# Patient Record
Sex: Female | Born: 1955 | Race: Black or African American | Hispanic: No | Smoking: Never smoker
Health system: Southern US, Community
[De-identification: ages and names within clinical notes are randomized; demographics above are authoritative.]

## PROBLEM LIST (undated history)

## (undated) DIAGNOSIS — I10 Essential (primary) hypertension: Secondary | ICD-10-CM

## (undated) DIAGNOSIS — M1990 Unspecified osteoarthritis, unspecified site: Secondary | ICD-10-CM

## (undated) DIAGNOSIS — E119 Type 2 diabetes mellitus without complications: Secondary | ICD-10-CM

## (undated) DIAGNOSIS — M109 Gout, unspecified: Secondary | ICD-10-CM

## (undated) DIAGNOSIS — N189 Chronic kidney disease, unspecified: Secondary | ICD-10-CM

## (undated) HISTORY — PX: TOTAL KNEE ARTHROPLASTY: SHX125

---

## 2000-05-14 ENCOUNTER — Other Ambulatory Visit: Admission: RE | Admit: 2000-05-14 | Discharge: 2000-05-14 | Payer: Self-pay | Admitting: *Deleted

## 2000-06-04 ENCOUNTER — Other Ambulatory Visit: Admission: RE | Admit: 2000-06-04 | Discharge: 2000-06-04 | Payer: Self-pay | Admitting: *Deleted

## 2003-04-07 ENCOUNTER — Encounter: Admission: RE | Admit: 2003-04-07 | Discharge: 2003-07-06 | Payer: Self-pay | Admitting: Internal Medicine

## 2003-06-10 ENCOUNTER — Encounter: Admission: RE | Admit: 2003-06-10 | Discharge: 2003-06-10 | Payer: Self-pay | Admitting: Internal Medicine

## 2004-01-14 ENCOUNTER — Other Ambulatory Visit: Admission: RE | Admit: 2004-01-14 | Discharge: 2004-01-14 | Payer: Self-pay | Admitting: Internal Medicine

## 2004-03-04 ENCOUNTER — Ambulatory Visit (HOSPITAL_COMMUNITY): Admission: RE | Admit: 2004-03-04 | Discharge: 2004-03-04 | Payer: Self-pay | Admitting: Gastroenterology

## 2005-03-20 HISTORY — PX: FOOT SURGERY: SHX648

## 2005-04-20 ENCOUNTER — Emergency Department (HOSPITAL_COMMUNITY): Admission: EM | Admit: 2005-04-20 | Discharge: 2005-04-20 | Payer: Self-pay | Admitting: Emergency Medicine

## 2005-10-17 ENCOUNTER — Other Ambulatory Visit: Admission: RE | Admit: 2005-10-17 | Discharge: 2005-10-17 | Payer: Self-pay | Admitting: Internal Medicine

## 2006-11-21 ENCOUNTER — Encounter (INDEPENDENT_AMBULATORY_CARE_PROVIDER_SITE_OTHER): Payer: Self-pay | Admitting: Nurse Practitioner

## 2006-11-21 ENCOUNTER — Ambulatory Visit: Payer: Self-pay | Admitting: Internal Medicine

## 2006-11-21 ENCOUNTER — Ambulatory Visit: Payer: Self-pay | Admitting: *Deleted

## 2006-11-21 LAB — CONVERTED CEMR LAB
ALT: 33 units/L (ref 0–35)
Albumin: 3.7 g/dL (ref 3.5–5.2)
BUN: 14 mg/dL (ref 6–23)
Basophils Absolute: 0 10*3/uL (ref 0.0–0.1)
CO2: 27 meq/L (ref 19–32)
Glucose, Bld: 211 mg/dL — ABNORMAL HIGH (ref 70–99)
HCT: 36.1 % (ref 36.0–46.0)
Hemoglobin: 11.1 g/dL — ABNORMAL LOW (ref 12.0–15.0)
Lymphocytes Relative: 30 % (ref 12–46)
MCV: 88.5 fL (ref 78.0–100.0)
Monocytes Relative: 5 % (ref 3–11)
Potassium: 3.9 meq/L (ref 3.5–5.3)
RDW: 15.2 % — ABNORMAL HIGH (ref 11.5–14.0)
Sodium: 140 meq/L (ref 135–145)
Total Bilirubin: 0.4 mg/dL (ref 0.3–1.2)

## 2006-12-10 ENCOUNTER — Ambulatory Visit: Payer: Self-pay | Admitting: Internal Medicine

## 2006-12-10 ENCOUNTER — Encounter (INDEPENDENT_AMBULATORY_CARE_PROVIDER_SITE_OTHER): Payer: Self-pay | Admitting: Nurse Practitioner

## 2006-12-10 LAB — CONVERTED CEMR LAB
BUN: 16 mg/dL (ref 6–23)
Calcium: 9.4 mg/dL (ref 8.4–10.5)
Chloride: 105 meq/L (ref 96–112)
Cholesterol: 182 mg/dL (ref 0–200)
HDL: 86 mg/dL (ref >39)
Potassium: 4 meq/L (ref 3.5–5.3)
Total CHOL/HDL Ratio: 2.1
VLDL: 14 mg/dL (ref 0–40)

## 2007-01-08 ENCOUNTER — Ambulatory Visit: Payer: Self-pay | Admitting: Family Medicine

## 2007-01-22 ENCOUNTER — Ambulatory Visit: Payer: Self-pay | Admitting: Family Medicine

## 2007-02-05 ENCOUNTER — Ambulatory Visit: Payer: Self-pay | Admitting: Family Medicine

## 2007-03-01 ENCOUNTER — Ambulatory Visit: Payer: Self-pay | Admitting: Family Medicine

## 2007-03-18 ENCOUNTER — Ambulatory Visit: Payer: Self-pay | Admitting: Family Medicine

## 2007-03-21 HISTORY — PX: JOINT REPLACEMENT: SHX530

## 2007-03-29 ENCOUNTER — Inpatient Hospital Stay (HOSPITAL_COMMUNITY): Admission: RE | Admit: 2007-03-29 | Discharge: 2007-04-03 | Payer: Self-pay | Admitting: Orthopedic Surgery

## 2007-07-04 ENCOUNTER — Ambulatory Visit: Payer: Self-pay | Admitting: Internal Medicine

## 2007-07-04 ENCOUNTER — Encounter (INDEPENDENT_AMBULATORY_CARE_PROVIDER_SITE_OTHER): Payer: Self-pay | Admitting: Nurse Practitioner

## 2007-07-04 LAB — CONVERTED CEMR LAB
Basophils Absolute: 0 10*3/uL (ref 0.0–0.1)
Basophils Relative: 0 % (ref 0–1)
Calcium: 10 mg/dL (ref 8.4–10.5)
Chloride: 104 meq/L (ref 96–112)
Eosinophils Relative: 4 % (ref 0–5)
MCV: 92.2 fL (ref 78.0–100.0)
Microalb, Ur: 92 mg/dL — ABNORMAL HIGH (ref 0.00–1.89)
Monocytes Absolute: 0.3 10*3/uL (ref 0.1–1.0)
Monocytes Relative: 5 % (ref 3–12)
Neutro Abs: 3.9 10*3/uL (ref 1.7–7.7)
Potassium: 4.3 meq/L (ref 3.5–5.3)
Sodium: 143 meq/L (ref 135–145)

## 2007-10-01 ENCOUNTER — Encounter: Admission: RE | Admit: 2007-10-01 | Discharge: 2007-10-01 | Payer: Self-pay | Admitting: Internal Medicine

## 2008-01-27 ENCOUNTER — Ambulatory Visit (HOSPITAL_COMMUNITY): Admission: RE | Admit: 2008-01-27 | Discharge: 2008-01-27 | Payer: Self-pay | Admitting: Orthopedic Surgery

## 2008-02-21 ENCOUNTER — Inpatient Hospital Stay (HOSPITAL_COMMUNITY): Admission: RE | Admit: 2008-02-21 | Discharge: 2008-02-25 | Payer: Self-pay | Admitting: Orthopedic Surgery

## 2008-09-24 ENCOUNTER — Other Ambulatory Visit: Admission: RE | Admit: 2008-09-24 | Discharge: 2008-09-24 | Payer: Self-pay | Admitting: Internal Medicine

## 2008-10-01 ENCOUNTER — Encounter: Admission: RE | Admit: 2008-10-01 | Discharge: 2008-10-01 | Payer: Self-pay | Admitting: Internal Medicine

## 2009-02-26 IMAGING — CR DG CHEST 2V
2 series · 2 of 2 positions shown · non-contrast
Comparison: 06/10/03

CLINICAL DATA: Preop for right knee surgery.
 CHEST - 2 VIEW:

[view not recorded (1 of 2)]
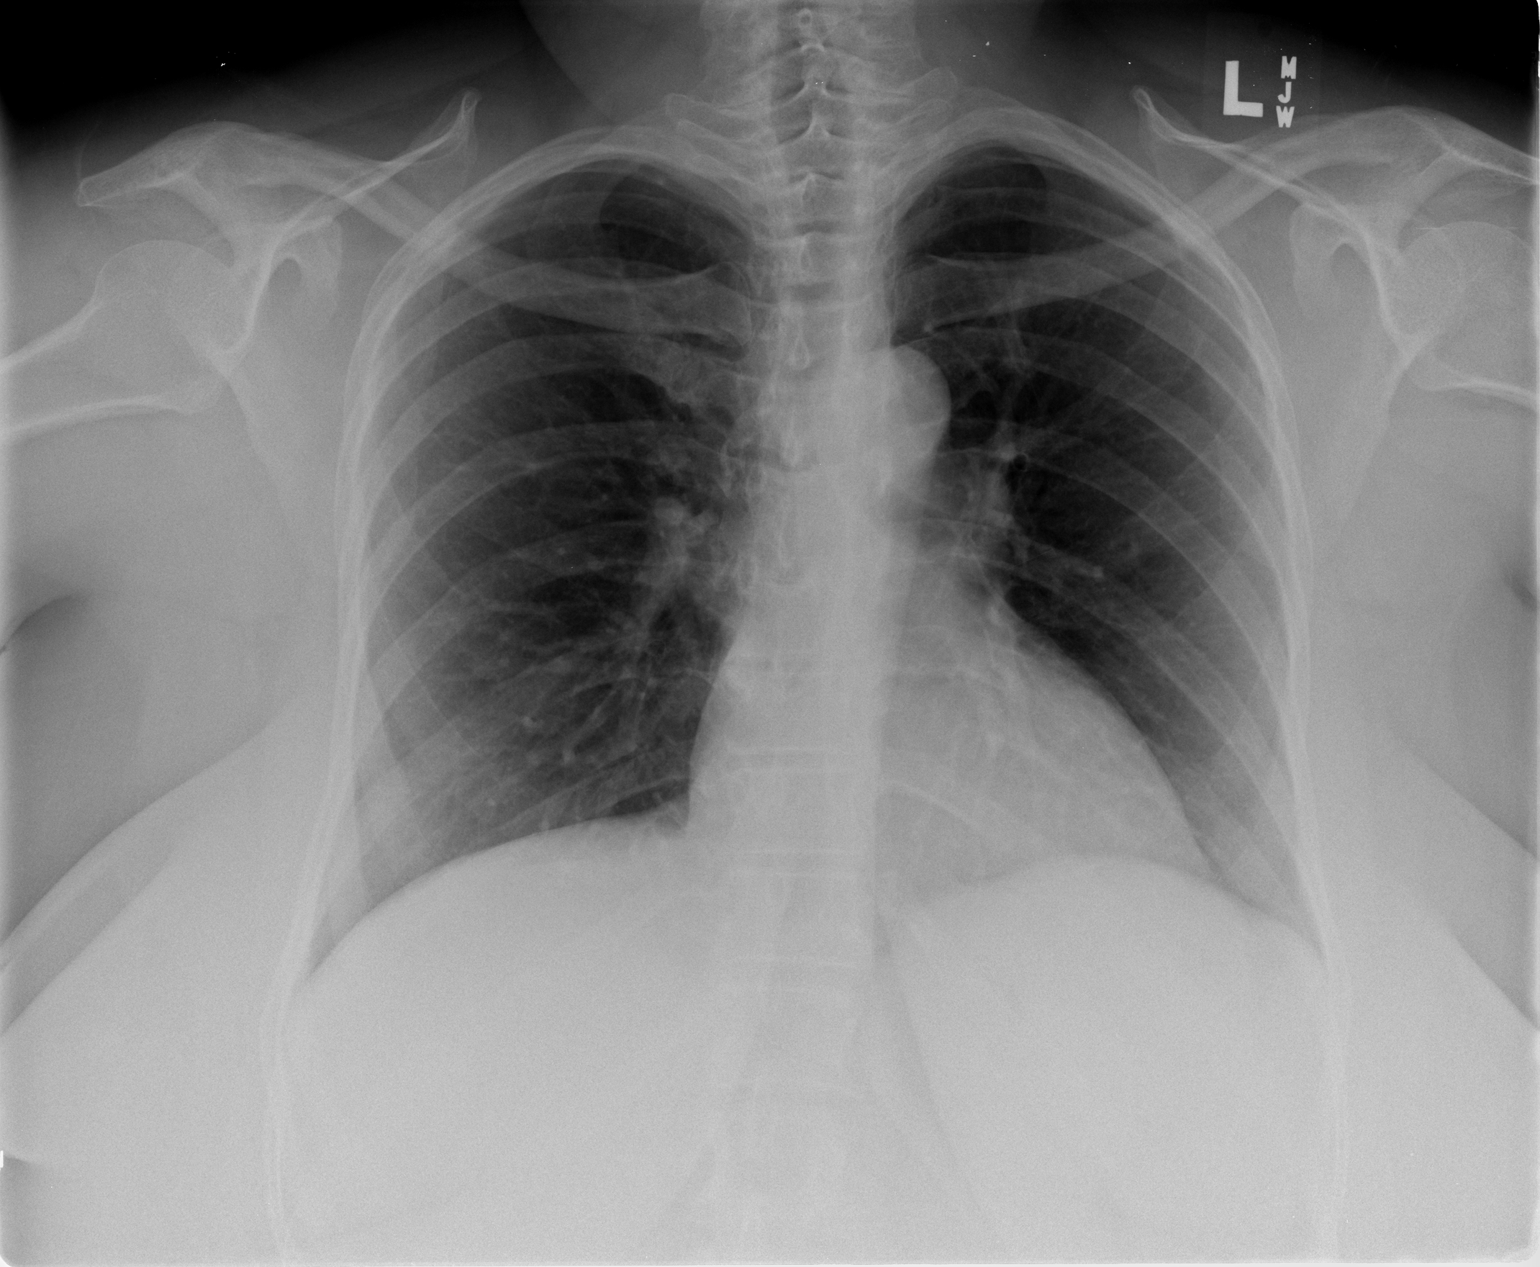

[view not recorded (2 of 2)]
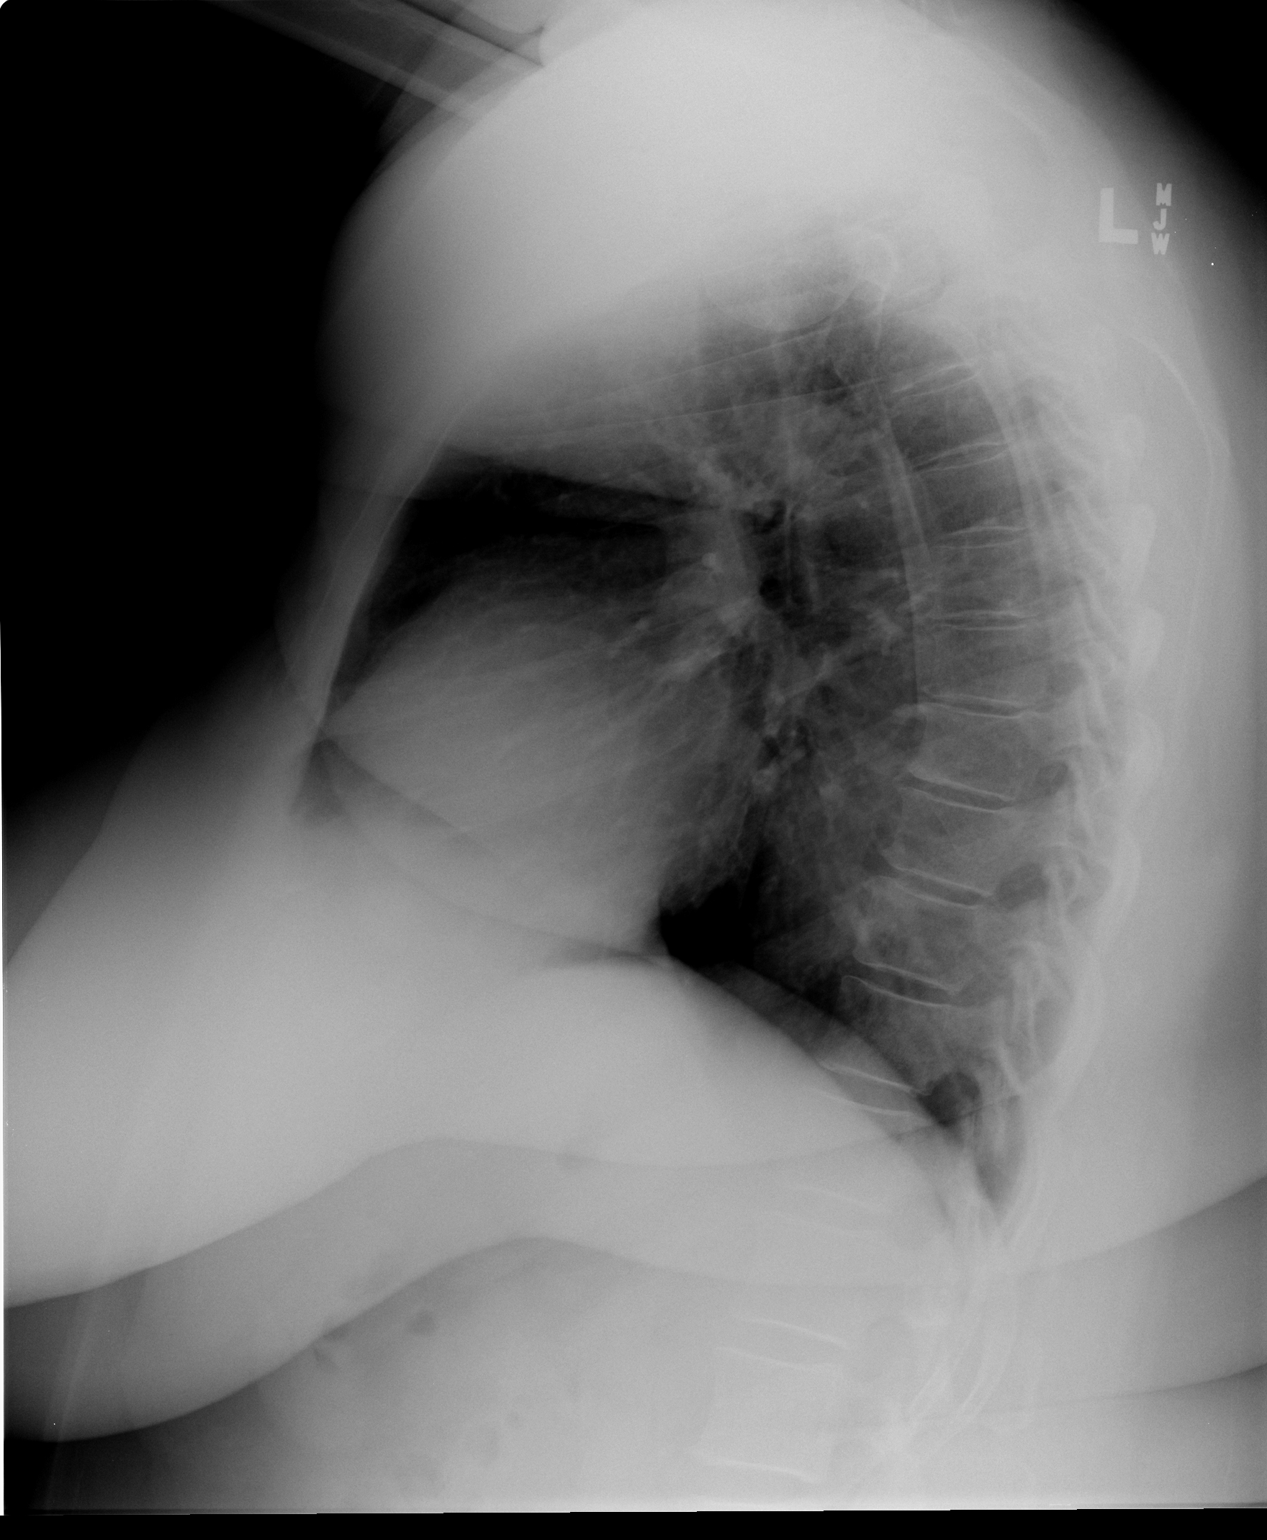

[2 of 2 positions shown; findings below may reference images not displayed]

FINDINGS: Two views of the chest show the lungs to be clear. The heart is within normal limits in size. No bony abnormality is seen.
IMPRESSION: No active lung disease.

## 2010-04-10 ENCOUNTER — Encounter: Payer: Self-pay | Admitting: Internal Medicine

## 2010-08-02 NOTE — Op Note (Signed)
NAME:  Kristen English, Kristen English              ACCOUNT NO.:  [id number]   MEDICAL RECORD NO.:  [id number]          PATIENT TYPE:  INP   LOCATION:  77                         FACILITY:  [facility]   PHYSICIAN:  Doran Heater. Veverly Fells, M.D. DATE OF BIRTH:  12-02-1955   DATE OF PROCEDURE:  02/21/2008  DATE OF DISCHARGE:                               OPERATIVE REPORT   PREOPERATIVE DIAGNOSIS:  Left knee end-stage osteoarthritis.   POSTOPERATIVE DIAGNOSIS:  Left knee end-stage osteoarthritis.   PROCEDURE PERFORMED:  Left total knee replacement.   SURGEON:  Doran Heater. Veverly Fells, MD   ASSISTANT:  Abbott Pao. Dixon, PA-C.   ANESTHESIA:  Spinal anesthesia was used plus MAC.   ESTIMATED BLOOD LOSS:  Minimal.   FLUID REPLACEMENT:  1000 mL crystalloid.   INSTRUMENT COUNT:  Correct.   COMPLICATIONS:  None.   Preoperative antibiotics were given.   INDICATIONS:  The patient is a 55 year old female with worsening left  knee pain.  The patient has end-stage arthritis on x-ray and has  developed debilitating pain in her knee and functional loss.  The  patient desires operative treatment to restore her function and  eliminate pain.  Informed consent was obtained.   DESCRIPTION OF PROCEDURE:  After an adequate level of anesthesia was  achieved, the patient was positioned supine on the operating room table.  The left leg was correctly identified.  Nonsterile tourniquet was placed  on the left proximal thigh.  The right leg was padded appropriately and  secured in the table.  After sterile prep and drape of left knee, we  went ahead and exsanguinated the limb using an Esmarch bandage and  elevating the tourniquet to 350 mmHg.  The patient's knee was entered  using a midline incision created with knee in flexion.  Medial  parapatellar arthrotomy was created.  Patella was everted.  Distal femur  entered using a step-cut drill.  We went ahead and removed 10 mm of bone  set on 5 degrees left.  We then went ahead and cut  the tibia, removed  the ACL, PCL, meniscal tissues, subluxed the tibia anteriorly, and cut  the tibia perpendicular to the long axis of tibia using an  extramedullary guide with minimal posterior slope.  Next, we went ahead  and checked her extension block, which was adequate.  Extension space  and gap was adequate and symmetric.  We then went ahead and incised her  femur to a size 4.  We then went ahead and removed the anterior,  posterior, and chamfer cuts using a 4-in-1 block.  Removed excess bone  off the posterior condyles using a 3-1/4 inch curved osteotome.  We then  incised the tibia to a size 3 and went ahead and performed a modular  keel punch and drill.  At this point, we went ahead and trialed with the  3 tibia and then trialed with the 4 left narrow trial, the standard size  4 implant was too wide, mediolateral.  At this point, we placed a 12.5  insert and were initially happy with our reduction, we might be able to  get  a 15.  We did go ahead and resurfaced the patella at this point  using a patellar clamp setting 360 mm of remaining bone and a 32  patellar button was placed.  Knee through full range of motion,  excellent patellofemoral stability was noted, removed all components,  placed the bone into the femur to plug that hole, and we used the  available bone for bone tamp.  We then cemented the components in the  place, size 3 tibia, size 4 narrow left femur, and then placed a 12.5  insert.  We allowed the cement to harden, we then removed excess cement  using 1/4-inch curved osteotome.  Next, we went and trialed with the 15,  15 was little bit too big and we went back to the size 12.5 trial.  At  this point, we thoroughly irrigated the knee.  We closed the knee with  layered closure #1 Vicryl for the parapatellar arthrotomy, 0 and 2-0  Vicryl for layered subcutaneous closure, and 4-0 running Monocryl for  skin.  Steri-Strips were applied followed by sterile dressing.   The  patient tolerated the surgery well.      Doran Heater. Veverly Fells, M.D.  Electronically Signed     SRN/MEDQ  D:  02/23/2008  T:  02/24/2008  Job:  [id number]

## 2010-08-02 NOTE — Op Note (Signed)
NAME:  Kristen English, Kristen English              ACCOUNT NO.:  [id number]   MEDICAL RECORD NO.:  [id number]          PATIENT TYPE:  INP   LOCATION:  2550                         FACILITY:  [facility]   PHYSICIAN:  Doran Heater. Veverly Fells, M.D. DATE OF BIRTH:  [birth year]   DATE OF PROCEDURE:  03/29/2007  DATE OF DISCHARGE:                               OPERATIVE REPORT   PREOP DIAGNOSIS:  Right knee end-stage osteoarthritis.   POSTOP DIAGNOSIS:  Right knee end-stage osteoarthritis.   PROCEDURE PERFORMED:  Right knee total knee replacement using DePuy  Sigma rotating platform prosthesis.   ATTENDING SURGEON:  Esmond Plants, MD.   ASSISTANT:  Charletta Cousin [place], [place].   General anesthesia plus femoral block anesthesia was used.   ESTIMATED BLOOD LOSS:  Minimal.   TOURNIQUET TIME:  2 hours and 10 minutes at 350 mmHg.   URINE OUTPUT:  150 mL.   FLUID REPLACEMENT:  2000 mL Crystalloid.   INSTRUMENT COUNT:  Correct.   No complications.  Perioperative antibiotics were given.   INDICATION:  Patient is a 55 year old female who presents with a history  of worsening right knee pain secondary to end-stage arthritis.  The  patient has failed conservative management, presents now for operative  treatment to restore function, eliminate pain.  Informed consent was  obtained.   DESCRIPTION OF PROCEDURE:  After an adequate level of anesthesia was  achieved, the patient was positioned supine on the operating room table,  the right leg was correctly identified, a nonsterile tourniquet was  placed in the right proximal thigh.  Right leg sterilely prepped and  draped in the usual manner.  After exsanguination of the limb using an  Esmarch bandage and elevation of the tourniquet to 350 mmHg, sterile  preparation and drape done.  We incised the knee with the knee in  flexion and went ahead and performed a medial parapatellar arthrotomy  using a fresh 10-blade scalpel.  We went ahead and everted the patella,  entered  the distal femur using a step cut drill, placed a distal femoral  intermedullary guide on the femur, set 5 degrees right with 11 mm  resection for slight flexion contracture.  Performed our distal femoral  cut.  Went ahead and cut the tibia due to difficulty getting the sizing  block on.  Went ahead and cut the tibia to 90 degrees perpendicular to  the long axis of the tibia with a slight posterior slope.  Next, we went  ahead and checked our extension block, we could easily get the 10 mm  block in.  We went ahead then and cut the distal femur using the DePuy  sizing jig which was a posterior skid and anterior down reference.  This  was then resized to a size 4.  I went ahead and cut the 4 cuts, both  anterior, posterior and Chamfer cuts.  At this point we went ahead and  removed posterior osteophytes, we then went ahead and completed our  femoral resection using a box cut guide to resect bone to provide for  the posterior cruciate substituting prosthesis we would be using and  then we went ahead and did our tibial preparation, sized up to size 4  and then went ahead and did our keel punch.  At this point, placed the  trial components in, reduced it with a 12.5 insert and had full  extension and good soft tissue balancing.  Next, we went ahead and  resurfaced the patella, patella was significantly deformed and only 23  mm in width and we went ahead and resected down to about 16 mm  thickness, providing a little bit more thickness than we normally would  for her size, which is near 300 pounds.  At this point, with the patella  sized to a 32 we used the lug drill to drill for the 32 patella, placed  the patellar button on, took the knee through a full range of motion.  Excellent patella tracking was noted and again soft tissue balance and  stability was good.  At this point, we took our components out,  thoroughly pulse irrigated, looked around for extra loose bone, removing  any that we  found.  We went ahead and inspected the posterior aspect of  the knee to make sure there was no extra bone or soft tissue present  posteriorly.  We then went ahead and used DePuy SmartSet cement and  cemented the components into place.  Again, size 4 tibia, size 4 femur,  placed the knee in full extension with a 12.5 insert in until the cement  was allowed to harden.  We also cemented the patella at the same time.  Once the cement was allowed to harden we went ahead and removed extra  cement with a 1//4-inch curved osteotome.  We trialed with a 12.5 and a  15 insert, the 15 insert did go in.  We were able to achieve full  extension and we had a little bit better tension with the knee in  flexion.  Thus, we selected the real 15 component, again inspecting  posteriorly and all around the tibial prosthesis for any excess cement  before placing the final component.  We then closed the knee with #1 PDS  suture, interrupted figure-of-eight sutures for the parapatellar  arthrotomy followed by #0 and #2-0 Vicryl for the subcutaneous closure  and #4-0 Monocryl for skin.  Steri-Strips applied followed by a sterile  dressing.  Patient tolerated surgery well.      Doran Heater. Veverly Fells, M.D.  Electronically Signed     SRN/MEDQ  D:  03/29/2007  T:  03/29/2007  Job:  [id number]

## 2010-08-02 NOTE — H&P (Signed)
NAME:  Kristen English, Kristen English              ACCOUNT NO.:  [id number]   MEDICAL RECORD NO.:  [id number]          PATIENT TYPE:  INP   LOCATION:                               FACILITY:  [facility]   PHYSICIAN:  Doran Heater. Veverly Fells, M.D. DATE OF BIRTH:  [birth year]   DATE OF ADMISSION:  01/31/2008  DATE OF DISCHARGE:                              HISTORY & PHYSICAL   CHIEF COMPLAINTS:  Left knee pain.   HISTORY OF PRESENT ILLNESS:  The patient is a 55 year old female with  worsening left knee pain that has been refractory to conservative  treatment.  The patient elected to have a left total knee arthroplasty  by Dr. Esmond Plants.   Past medical history is significant for hypertension.   Family history is coronary artery disease.   SOCIAL HISTORY:  The patient of Dr. Lysle Rubens, does not smoke or use  alcohol.   ALLERGIES:  MORPHINE.   CURRENT MEDICATIONS:  1. Metformin 500 mg 2 tablets b.i.d.  2. Lipitor 30 mg p.o. daily.  3. Hydrochlorothiazide 25 mg p.o. daily.  4. Azor 10/20 mg p.o. daily.  5. Atenolol 150 mg daily.  6. Glucotrol 15 mg b.i.d.  7. Aspirin 81 mg p.o. daily.   REVIEW OF SYSTEMS:  She has pain with ambulation.   PHYSICAL EXAMINATION:  VITAL SIGNS:  Pulse 82, respirations 16, and  blood pressure 150/94.  GENERAL:  The patient is a healthy-appearing 55 year old female, in no  acute distress.  Pleasant mood and affect, oriented x3.  HEAD AND NECK:  She has full range of motion of the cervical spine  without any tenderness.  NEUROLOGIC:  Cranial nerves II-XII grossly intact.  CHEST:  She has active breath sounds bilaterally.  No wheezes, rhonchi,  or rales.  HEART:  Regular rate and rhythm.  No murmur.  ABDOMEN:  Nontender, nondistended with active bowel sounds.  EXTREMITIES:  Moderate tenderness on the medial left knee, especially  with range of motion.  She does have some moderate crepitus.  SKIN:  No rashes and no edema.   X-rays show end-stage osteoarthritis of the left  knee.   IMPRESSION:  End-stage osteoarthritis of the left knee.   PLAN OF ACTION:  Left total knee arthroplasty by Dr. Esmond Plants.      Thomas B. Doren Custard, P.A.      Doran Heater. Veverly Fells, M.D.  Electronically Signed    TBD/MEDQ  D:  01/21/2008  T:  01/21/2008  Job:  [id number]

## 2010-08-02 NOTE — H&P (Signed)
NAME:  Kristen English, Kristen English              ACCOUNT NO.:  [id number]   MEDICAL RECORD NO.:  [id number]           PATIENT TYPE:   LOCATION:                                 FACILITY:   PHYSICIAN:  Doran Heater. Veverly Fells, M.D. DATE OF BIRTH:  May 19, 1955   DATE OF ADMISSION:  DATE OF DISCHARGE:                              HISTORY & PHYSICAL   CHIEF COMPLAINT:  Right knee pain.   HISTORY OF PRESENT ILLNESS:  The patient is a healthy 55 year old female  with worsening right knee pain that has been refractory to conservative  treatment.  The patient has elected to have a right total knee  arthroplasty by Dr. Esmond Plants.   PAST MEDICAL HISTORY:  1. Hypertension.  2. Diabetes.   FAMILY MEDICAL HISTORY:  Significant for coronary artery disease,  hypertension and diabetes.   SOCIAL HISTORY:  A patient of Dr. Lysle Rubens; does not smoke or use alcohol  and lives alone.   DRUG ALLERGIES:  MORPHINE.   CURRENT MEDICATIONS:  1. Glucotrol 5 mg p.o. b.i.d.  2. Atenolol 50 mg p.o. b.i.d.  3. Lipitor 10 mg p.o. daily.  4. Aspirin 81 mg p.o. daily.  5. Metformin 500 mg 2 tabs p.o. b.i.d.  6. Azor 10/40 mg 1 tab p.o. daily.  7. HCTZ 25 mg 1 tab p.o. daily.  8. Clonidine 0.2 mg p.o. daily.   REVIEW OF SYSTEMS:  Negative except for pain with ambulation.   PHYSICAL EXAMINATION:  VITAL SIGNS:  Pulse 90, respiration 18, blood  pressure 158/108.  GENERAL:  Patient is a healthy-appearing 55 year old female in no acute  distress, pleasant mood and affect, alert and oriented x3.  MUSCULOSKELETAL:  Bones and joints are without any tenderness.  NEUROLOGIC:  Cranial nerves II-XII are grossly intact.  CHEST:  Active breath sounds bilaterally with no wheezes, rhonchi or  rales.  HEART:  Shows regular rate and rhythm without murmur.  ABDOMEN:  Nontender, nondistended with active bowel sounds.  EXTREMITIES:  Show moderate tenderness to the right knee with range of  motion and weightbearing.  SKIN:  Shows no signs of  rashes.  NEUROVASCULARLY:  She is intact otherwise in bilateral upper and lower  extremities.  No edema.   X-rays show severe osteoarthritis of the right knee.   IMPRESSION:  End-stage osteoarthritis of right knee.   PLAN OF ACTION:  To have a right total knee arthroplasty by Dr. Esmond Plants.      Thomas B. Doren Custard, P.A.      Doran Heater. Veverly Fells, M.D.  Electronically Signed    TBD/MEDQ  D:  03/19/2007  T:  03/19/2007  Job:  [id number]

## 2010-08-02 NOTE — Discharge Summary (Signed)
NAME:  Kristen English, Kristen English              ACCOUNT NO.:  [id number]   MEDICAL RECORD NO.:  [id number]          PATIENT TYPE:  INP   LOCATION:  38                         FACILITY:  [facility]   PHYSICIAN:  Doran Heater. Veverly Fells, M.D. DATE OF BIRTH:  1955/11/21   DATE OF ADMISSION:  02/21/2008  DATE OF DISCHARGE:  02/25/2008                               DISCHARGE SUMMARY   ADMISSION DIAGNOSIS:  Left knee end-stage osteoarthritis.   DISCHARGE DIAGNOSES:  1. Left knee end-stage osteoarthritis.  2. Blood loss anemia, improved.   BRIEF HISTORY:  The patient is a 55 year old female with worsening left  knee pain secondary to osteoarthritis.  The patient is elected to have  her knee replacement.   PROCEDURE:  The patient with left total knee arthroplasty by Dr. Esmond Plants on February 21, 2008.   ASSISTANT:  Abbott Pao. Dixon, PA-C   ESTIMATED BLOOD LOSS:  Minimal.   COMPLICATIONS:  None.   HOSPITAL COURSE:  The patient was admitted on February 21, 2008, for the  above-stated procedure, which she tolerated well.  After adequate time  in the post anesthesia care unit, she was transferred up to 5000.  Postop day #1, the patient complained of some mild weakness and  dizziness.  She did have mild drop in her hemoglobin and her hematocrit,  but her wound was healing well.  She was able to work with physical  therapy quite well.  Postop day #2, she states that her dizziness did  continue.  She was able to work with physical therapy.  Postop day #3,  she states she was actually feeling better, but her H and H was 7.6 and  22.3 respectively.  Thus she was transfused 2 units of blood.  The  patient states she had overall felt okay before the transfusion and  definitely felt much better after the transfusion.  On postop day #4,  she was able to work with physical therapy quite well and able to  ambulate recently without any difficulty and thus this patient was  discharged to home on postop day #4.  Her  condition is stable.  Her diet  is regular.   FOLLOWUP:  The patient follow back up with Dr. Esmond Plants in 2 weeks.   ALLERGIES:  The patient has allergy to MORPHINE.   DISCHARGE MEDICATIONS:  1. Norvasc 10 mg p.o. daily.  2. Hold her aspirin which she takes 81 mg p.o. daily.  3. Tenormin 50 mg p.o. b.i.d.  4. Glucotrol 5 mg b.i.d.  5. Hydrochlorothiazide 25 mg p.o. daily.  6. Insulin 1-15 units subcu t.i.d.  7. Metformin 1000 mg b.i.d.  8. Robaxin 500 mg p.o. q.6 h.  9. Percocet 5/325 one to tablets q.4-6 h. p.r.n. pain.  10.Coumadin per pharmacy protocol.      Thomas B. Doren Custard, P.A.      Doran Heater. Veverly Fells, M.D.  Electronically Signed    TBD/MEDQ  D:  02/25/2008  T:  02/26/2008  Job:  [id number]

## 2010-08-02 NOTE — H&P (Signed)
NAME:  Kristen English, Kristen English              ACCOUNT NO.:  [id number]   MEDICAL RECORD NO.:  [id number]          PATIENT TYPE:  OUT   LOCATION:  MLAB                         FACILITY:  [facility]   PHYSICIAN:  Doran Heater. Veverly Fells, M.D. DATE OF BIRTH:  [birth year]   DATE OF ADMISSION:  01/27/2008  DATE OF DISCHARGE:  01/27/2008                              HISTORY & PHYSICAL   CHIEF COMPLAINT:  Left knee pain.   HISTORY OF PRESENT ILLNESS:  The patient is a 55 year old female with  worsening left knee pain secondary to osteoarthritis.  The patient has  had continued worsening symptoms and has been refractory to conservative  treatment.  The patient had her first scheduled surgeries cancelled due  to the gout attack in her right foot, but that is completely resolved.  Thus we are going to have her scheduled for total knee arthroplasty.   PAST MEDICAL HISTORY:  Significant for hypertension.   FAMILY MEDICAL HISTORY:  Significant for coronary artery disease.   SOCIAL HISTORY:  The patient of Dr. Lysle Rubens, does not smoke or use  alcohol.   ALLERGIES:  The patient has allergic to MORPHINE.   CURRENT MEDICATIONS:  1. Metformin 500 mg two tablets b.i.d.  2. Lipitor 30 mg p.o. daily.  3. Hydrochlorothiazide 25 mg p.o. daily.  4. Azor 10/20 mg p.o. daily.  5. Atenolol 150 mg p.o. daily.  6. Glucotrol 15 mg b.i.d.  7. Aspirin 81 mg p.o. daily.   REVIEW OF SYSTEMS:  She has pain with ambulation.   PHYSICAL EXAMINATION:  VITAL SIGNS:  Pulse 76, respirations 16, blood  pressure was 140/90 today.  GENERAL:  The patient is a healthy-appearing 55 year old female in no  distress.  Pleasant mood and affect.  Oriented x3.  HEAD AND NECK:  Full range of motion without any difficulty.  Cranial  nerves II through XII grossly intact.  CHEST:  Active breath sounds bilaterally.  No wheezes, rhonchi, or  rales.  ABDOMEN:  Nontender, nondistended.  HEART:  Regular rate and rhythm.  No murmur.  EXTREMITIES:  Moderate  tenderness on the left knee with moderate  crepitus trace effusion.  She does have an antalgic gait.  Neurovascularly she is intact distally.  Radiographs show end-stage  osteoarthritis to the left knee.   IMPRESSION:  End-stage osteoarthritis, left knee.   PLAN:  Plan is to have a left total knee arthroplasty by Dr. Esmond Plants.      Thomas B. Doren Custard, P.A.      Doran Heater. Veverly Fells, M.D.  Electronically Signed    TBD/MEDQ  D:  02/11/2008  T:  02/12/2008  Job:  [id number]

## 2010-08-02 NOTE — Discharge Summary (Signed)
NAME:  Kristen English, Kristen English              ACCOUNT NO.:  [id number]   MEDICAL RECORD NO.:  [id number]          PATIENT TYPE:  INP   LOCATION:  5033                         FACILITY:  [facility]   PHYSICIAN:  Doran Heater. Veverly Fells, M.D. DATE OF BIRTH:  [birth year]   DATE OF ADMISSION:  03/29/2007  DATE OF DISCHARGE:  04/03/2007                               DISCHARGE SUMMARY   ADMISSION DIAGNOSIS:  Right knee end-stage osteoarthritis.   DISCHARGE DIAGNOSIS:  Right knee end-stage osteoarthritis.   BRIEF HISTORY:  The patient is a 55 year old female with worsening right  knee pain status post conservative methods.  The patient has elected to  undergo right total knee arthroplasty by Dr. Esmond Plants.   PROCEDURE:  The patient underwent a right total knee arthroplasty using  the DePuy Sigma rotating platform system by Dr. Esmond Plants on March 29, 2007.  Assistant was [employer], PA-AC. General anesthesia plus  femoral block was used.  Estimated blood loss was minimal. Urine output  150 mL. Fluid replacement 2000 mL. No complications.   HOSPITAL COURSE:  The patient was admitted on March 29, 2007, for the  above-stated procedure which she tolerated well. After adequate time in  the postanesthesia care unit, she was transferred up to 5000.  Postop  day #1, the patient complained of moderate pain to the right knee.  Unfortunately, she did not get placed to the CPM until postop day #3, so  she did have some limitation to reverse to her range of motion and her  flexion. The patient's labs did show that she had a blood loss anemia,  dropping down to 8.5 hemoglobin. She was transfused 2 units  packed red  blood cells which did improve her energy and her ability be able to  progress with physical therapy.   Postop day #4, the patient was sitting up fairly comfortably, able to do  some mild walking, but she till has some limitations with regards to her  flexion of her right knee.  This patient was kept  until postop day #5,  on January 14.  She was able to finish up with physical therapy and work  with some gentle stairs and rolling walker.  Her incision was healing  well.  No signs or symptoms of erythema or infection.  Neurovascularly,  she was intact distally with only some mild pedal edema.  She had some  mild numbness to the lateral calf and also the lateral aspect of her  incision which is normal for the lateral part of the incision. We will  continue to watch the numbness in the lateral part of her calf.  Otherwise, she was neurologically intact.   DISCHARGE/PLAN:  The patient will be discharged home on April 03, 2007.   FOLLOWUP:  The patient is to back up with Dr. Esmond Plants in two  weeks.   CONDITION:  Stable.   DIET:  Regular.   The patient has allergies to MORPHINE.   DISCHARGE MEDICATIONS:  1. Norvasc 10 mg p.o. daily.  2. Aspirin 81 mg p.o. daily.  3. Atenolol 50 mg p.o. b.i.d.  4. Clonidine 0.2 mg p.o. nightly.  5. Glucotrol 5 mg p.o. daily.  6. Hydrochlorothiazide 25 mg p.o. daily.  7. Insulin 1-15 units subcutaneously 3 times a day with food.  8. Avapro 300 mg p.o. daily.  9. Glucophage 1000 mg p.o. daily.  10.Zocor 20 mg p.o. daily.  11.Coumadin per pharmacy protocol.  12.Robaxin 500 mg 1 tablet p.o. q.6 h.  13.Percocet 5/325 one to two tablets q. 4-6 h p.r.n. pain.      Thomas B. Doren Custard, P.A.      Doran Heater. Veverly Fells, M.D.  Electronically Signed    TBD/MEDQ  D:  04/02/2007  T:  04/02/2007  Job:  [id number]

## 2010-08-05 NOTE — Op Note (Signed)
NAME:  Kristen English, Kristen English              ACCOUNT NO.:  [id number]   MEDICAL RECORD NO.:  [id number]          PATIENT TYPE:  AMB   LOCATION:  ENDO                         FACILITY:  [facility]   PHYSICIAN:  Earle Gell, M.D.   DATE OF BIRTH:  1955/11/02   DATE OF PROCEDURE:  03/04/2004  DATE OF DISCHARGE:                                 OPERATIVE REPORT   PROCEDURE:  Screening colonoscopy.   INDICATIONS FOR PROCEDURE:  Kristen English is a 55 year old  female born July 05, 1955.  The patient is undergoing her first screening  colonoscopy with polypectomy to prevent colon cancer.   ENDOSCOPIST:  Earle Gell, M.D.   PREMEDICATION:  Versed 8.5 mg, Demerol 80 mg.   DESCRIPTION OF PROCEDURE:  After obtaining informed consent, the patient was  placed in the left lateral decubitus position. I administered intravenous  Demerol and intravenous Versed to achieve conscious sedation for the  procedure. The patient's blood pressure, oxygen saturation and cardiac  rhythm were monitored throughout the procedure and documented in the medical  record.   Anal inspection and digital rectal exam were normal. The Olympus pediatric  video colonoscope was introduced into the rectum and advanced to the cecum.  Colonic preparation for the exam today was excellent.   RECTUM:  Normal.   SIGMOID COLON AND DESCENDING COLON:  Normal.   SPLENIC FLEXURE:  Normal.   TRANSVERSE COLON:  Normal.   HEPATIC FLEXURE:  Normal.   ASCENDING COLON:  Normal.   CECUM AND ILEOCECAL VALVE:  Normal.   ASSESSMENT:  Normal screening proctocolonoscopy to the cecum.     MJ/MEDQ  D:  03/04/2004  T:  03/04/2004  Job:  [id number]

## 2010-12-08 LAB — BASIC METABOLIC PANEL
BUN: 16
BUN: 25 — ABNORMAL HIGH
BUN: 31 — ABNORMAL HIGH
CO2: 26
CO2: 27
Calcium: 8.9
Calcium: 9.7
Chloride: 101
Chloride: 104
Chloride: 104
Creatinine, Ser: 2.36 — ABNORMAL HIGH
Creatinine, Ser: 2.73 — ABNORMAL HIGH
GFR calc Af Amer: 22 — ABNORMAL LOW
GFR calc Af Amer: 26 — ABNORMAL LOW
GFR calc Af Amer: 37 — ABNORMAL LOW
GFR calc non Af Amer: 17 — ABNORMAL LOW
GFR calc non Af Amer: 18 — ABNORMAL LOW
GFR calc non Af Amer: 22 — ABNORMAL LOW
GFR calc non Af Amer: 30 — ABNORMAL LOW
GFR calc non Af Amer: 41 — ABNORMAL LOW
Glucose, Bld: 117 — ABNORMAL HIGH
Glucose, Bld: 127 — ABNORMAL HIGH
Glucose, Bld: 295 — ABNORMAL HIGH
Potassium: 4.3
Potassium: 4.4
Potassium: 5.3 — ABNORMAL HIGH
Potassium: 6.8
Sodium: 136
Sodium: 138
Sodium: 139
Sodium: 141

## 2010-12-08 LAB — CBC
HCT: 26.3 — ABNORMAL LOW
HCT: 31.9 — ABNORMAL LOW
HCT: 35.8 — ABNORMAL LOW
Hemoglobin: 10.1 — ABNORMAL LOW
Hemoglobin: 10.2 — ABNORMAL LOW
Hemoglobin: 11 — ABNORMAL LOW
Hemoglobin: 11.6 — ABNORMAL LOW
MCHC: 31.9
MCHC: 32.5
MCHC: 33.3
MCV: 85.6
MCV: 86.9
MCV: 87.4
Platelets: 226
Platelets: 257
Platelets: 326
Platelets: 343
RBC: 3.54 — ABNORMAL LOW
RBC: 3.65 — ABNORMAL LOW
RBC: 4.18
RDW: 15.2
RDW: 15.4
RDW: 15.6 — ABNORMAL HIGH
RDW: 16.1 — ABNORMAL HIGH
WBC: 12.4 — ABNORMAL HIGH
WBC: 5.7
WBC: 7.4
WBC: 7.5

## 2010-12-08 LAB — PROTIME-INR
INR: 1
INR: 1.1
INR: 1.6 — ABNORMAL HIGH
INR: 1.9 — ABNORMAL HIGH
INR: 2.2 — ABNORMAL HIGH
Prothrombin Time: 11.3 — ABNORMAL LOW
Prothrombin Time: 13.2
Prothrombin Time: 14.2
Prothrombin Time: 19 — ABNORMAL HIGH
Prothrombin Time: 22.3 — ABNORMAL HIGH

## 2010-12-08 LAB — COMPREHENSIVE METABOLIC PANEL
AST: 34
Albumin: 3.7
Calcium: 9.9
GFR calc non Af Amer: 42 — ABNORMAL LOW
Total Bilirubin: 0.2 — ABNORMAL LOW

## 2010-12-08 LAB — CROSSMATCH
ABO/RH(D): B POS
Antibody Screen: NEGATIVE

## 2010-12-08 LAB — ABO/RH: ABO/RH(D): B POS

## 2010-12-08 LAB — URINALYSIS, ROUTINE W REFLEX MICROSCOPIC
Bilirubin Urine: NEGATIVE
Glucose, UA: NEGATIVE
Ketones, ur: NEGATIVE
Leukocytes, UA: NEGATIVE
Nitrite: NEGATIVE
Protein, ur: 100 — AB
Specific Gravity, Urine: 1.018
Urobilinogen, UA: 0.2
pH: 5.5

## 2010-12-08 LAB — URINE MICROSCOPIC-ADD ON

## 2010-12-08 LAB — APTT: aPTT: 31

## 2010-12-08 LAB — TYPE AND SCREEN

## 2010-12-08 LAB — POTASSIUM: Potassium: 6.3

## 2010-12-20 LAB — URIC ACID: Uric Acid, Serum: 9.2 — ABNORMAL HIGH

## 2010-12-23 LAB — DIFFERENTIAL
Basophils Absolute: 0 10*3/uL (ref 0.0–0.1)
Basophils Absolute: 0 10*3/uL (ref 0.0–0.1)
Basophils Relative: 0 % (ref 0–1)
Basophils Relative: 0 % (ref 0–1)
Eosinophils Absolute: 0 10*3/uL (ref 0.0–0.7)
Lymphocytes Relative: 17 % (ref 12–46)
Lymphocytes Relative: 18 % (ref 12–46)
Lymphocytes Relative: 24 % (ref 12–46)
Lymphs Abs: 1.3 10*3/uL (ref 0.7–4.0)
Lymphs Abs: 1.4 10*3/uL (ref 0.7–4.0)
Monocytes Absolute: 0.5 10*3/uL (ref 0.1–1.0)
Neutro Abs: 3.5 10*3/uL (ref 1.7–7.7)
Neutro Abs: 5.1 10*3/uL (ref 1.7–7.7)
Neutro Abs: 5.4 10*3/uL (ref 1.7–7.7)
Neutrophils Relative %: 67 % (ref 43–77)
Neutrophils Relative %: 71 % (ref 43–77)
Neutrophils Relative %: 84 % — ABNORMAL HIGH (ref 43–77)

## 2010-12-23 LAB — CBC
HCT: 28 % — ABNORMAL LOW (ref 36.0–46.0)
Hemoglobin: 9.2 g/dL — ABNORMAL LOW (ref 12.0–15.0)
MCHC: 32.8 g/dL (ref 30.0–36.0)
MCHC: 33.8 g/dL (ref 30.0–36.0)
MCV: 85.6 fL (ref 78.0–100.0)
Platelets: 230 10*3/uL (ref 150–400)
Platelets: 244 10*3/uL (ref 150–400)
Platelets: 246 10*3/uL (ref 150–400)
Platelets: 251 10*3/uL (ref 150–400)
RBC: 4 MIL/uL (ref 3.87–5.11)
RDW: 15.1 % (ref 11.5–15.5)
RDW: 15.3 % (ref 11.5–15.5)
WBC: 5.2 10*3/uL (ref 4.0–10.5)
WBC: 7.7 10*3/uL (ref 4.0–10.5)
WBC: 7.7 10*3/uL (ref 4.0–10.5)

## 2010-12-23 LAB — GLUCOSE, CAPILLARY
Glucose-Capillary: 112 mg/dL — ABNORMAL HIGH (ref 70–99)
Glucose-Capillary: 124 mg/dL — ABNORMAL HIGH (ref 70–99)
Glucose-Capillary: 137 mg/dL — ABNORMAL HIGH (ref 70–99)
Glucose-Capillary: 139 mg/dL — ABNORMAL HIGH (ref 70–99)
Glucose-Capillary: 152 mg/dL — ABNORMAL HIGH (ref 70–99)
Glucose-Capillary: 165 mg/dL — ABNORMAL HIGH (ref 70–99)
Glucose-Capillary: 173 mg/dL — ABNORMAL HIGH (ref 70–99)
Glucose-Capillary: 192 mg/dL — ABNORMAL HIGH (ref 70–99)
Glucose-Capillary: 94 mg/dL (ref 70–99)

## 2010-12-23 LAB — PROTIME-INR
INR: 0.9 (ref 0.00–1.49)
INR: 1 (ref 0.00–1.49)
INR: 1.4 (ref 0.00–1.49)
Prothrombin Time: 12 seconds (ref 11.6–15.2)
Prothrombin Time: 13.3 seconds (ref 11.6–15.2)
Prothrombin Time: 14.9 seconds (ref 11.6–15.2)
Prothrombin Time: 17.7 seconds — ABNORMAL HIGH (ref 11.6–15.2)

## 2010-12-23 LAB — URINE MICROSCOPIC-ADD ON

## 2010-12-23 LAB — BASIC METABOLIC PANEL
BUN: 17 mg/dL (ref 6–23)
BUN: 21 mg/dL (ref 6–23)
BUN: 21 mg/dL (ref 6–23)
BUN: 22 mg/dL (ref 6–23)
BUN: 25 mg/dL — ABNORMAL HIGH (ref 6–23)
CO2: 25 mEq/L (ref 19–32)
Calcium: 8.7 mg/dL (ref 8.4–10.5)
Calcium: 9 mg/dL (ref 8.4–10.5)
Calcium: 9.3 mg/dL (ref 8.4–10.5)
Chloride: 99 mEq/L (ref 96–112)
Creatinine, Ser: 1.34 mg/dL — ABNORMAL HIGH (ref 0.4–1.2)
Creatinine, Ser: 1.35 mg/dL — ABNORMAL HIGH (ref 0.4–1.2)
Creatinine, Ser: 1.44 mg/dL — ABNORMAL HIGH (ref 0.4–1.2)
GFR calc Af Amer: 50 mL/min — ABNORMAL LOW (ref >60)
GFR calc non Af Amer: 38 mL/min — ABNORMAL LOW (ref >60)
GFR calc non Af Amer: 38 mL/min — ABNORMAL LOW (ref >60)
GFR calc non Af Amer: 41 mL/min — ABNORMAL LOW (ref >60)
GFR calc non Af Amer: 42 mL/min — ABNORMAL LOW (ref >60)
Glucose, Bld: 126 mg/dL — ABNORMAL HIGH (ref 70–99)
Glucose, Bld: 131 mg/dL — ABNORMAL HIGH (ref 70–99)
Glucose, Bld: 169 mg/dL — ABNORMAL HIGH (ref 70–99)
Potassium: 4.2 mEq/L (ref 3.5–5.1)
Sodium: 136 mEq/L (ref 135–145)
Sodium: 138 mEq/L (ref 135–145)

## 2010-12-23 LAB — CROSSMATCH: Antibody Screen: NEGATIVE

## 2010-12-23 LAB — URINALYSIS, ROUTINE W REFLEX MICROSCOPIC
Leukocytes, UA: NEGATIVE
Nitrite: NEGATIVE
Protein, ur: >300 mg/dL — AB
Urobilinogen, UA: 0.2 mg/dL (ref 0.0–1.0)

## 2010-12-23 LAB — APTT: aPTT: 27 seconds (ref 24–37)

## 2010-12-23 LAB — TYPE AND SCREEN

## 2011-02-21 ENCOUNTER — Other Ambulatory Visit: Payer: Self-pay | Admitting: Obstetrics and Gynecology

## 2011-02-21 ENCOUNTER — Other Ambulatory Visit (HOSPITAL_COMMUNITY)
Admission: RE | Admit: 2011-02-21 | Discharge: 2011-02-21 | Disposition: A | Discharge status: Home / Self Care | Payer: Medicare PPO | Source: Ambulatory Visit | Attending: Obstetrics and Gynecology | Admitting: Obstetrics and Gynecology

## 2011-02-21 DIAGNOSIS — Z01419 Encounter for gynecological examination (general) (routine) without abnormal findings: Secondary | ICD-10-CM | POA: Insufficient documentation

## 2011-05-01 ENCOUNTER — Other Ambulatory Visit (INDEPENDENT_AMBULATORY_CARE_PROVIDER_SITE_OTHER): Payer: Self-pay | Admitting: General Surgery

## 2012-03-19 ENCOUNTER — Other Ambulatory Visit: Payer: Self-pay | Admitting: Obstetrics and Gynecology

## 2012-03-19 ENCOUNTER — Other Ambulatory Visit (HOSPITAL_COMMUNITY)
Admission: RE | Admit: 2012-03-19 | Discharge: 2012-03-19 | Disposition: A | Discharge status: Home / Self Care | Payer: Medicare PPO | Source: Ambulatory Visit | Attending: Obstetrics and Gynecology | Admitting: Obstetrics and Gynecology

## 2012-03-19 DIAGNOSIS — Z124 Encounter for screening for malignant neoplasm of cervix: Secondary | ICD-10-CM | POA: Insufficient documentation

## 2012-11-06 ENCOUNTER — Other Ambulatory Visit: Payer: Self-pay | Admitting: Nephrology

## 2012-11-06 DIAGNOSIS — N189 Chronic kidney disease, unspecified: Secondary | ICD-10-CM

## 2012-11-08 ENCOUNTER — Ambulatory Visit
Admission: RE | Admit: 2012-11-08 | Discharge: 2012-11-08 | Disposition: A | Discharge status: Home / Self Care | Payer: 59 | Source: Ambulatory Visit | Attending: Nephrology | Admitting: Nephrology

## 2012-11-08 DIAGNOSIS — N189 Chronic kidney disease, unspecified: Secondary | ICD-10-CM

## 2012-11-11 ENCOUNTER — Telehealth: Payer: Self-pay | Admitting: Internal Medicine

## 2012-11-11 NOTE — Telephone Encounter (Signed)
LVOM FOR PT TO RETURN CALL IN RE TO REFERRAL.  °

## 2012-11-13 ENCOUNTER — Telehealth: Payer: Self-pay | Admitting: Internal Medicine

## 2012-11-13 NOTE — Telephone Encounter (Signed)
S/W PT IN RE TO NP APPT 09/16 @ 1:30 W/DR. [place] PACKET MAILED.

## 2012-11-13 NOTE — Telephone Encounter (Signed)
C/D 11/13/12 for appt. 12/03/12

## 2012-12-03 ENCOUNTER — Encounter (HOSPITAL_COMMUNITY): Payer: Self-pay

## 2012-12-03 ENCOUNTER — Encounter: Payer: Self-pay | Admitting: Internal Medicine

## 2012-12-03 ENCOUNTER — Ambulatory Visit: Payer: 59

## 2012-12-03 ENCOUNTER — Ambulatory Visit (HOSPITAL_BASED_OUTPATIENT_CLINIC_OR_DEPARTMENT_OTHER): Payer: 59 | Admitting: Internal Medicine

## 2012-12-03 ENCOUNTER — Ambulatory Visit (HOSPITAL_COMMUNITY)
Admission: RE | Admit: 2012-12-03 | Discharge: 2012-12-03 | Disposition: A | Discharge status: Home / Self Care | Payer: Medicare PPO | Source: Ambulatory Visit | Attending: Nephrology | Admitting: Nephrology

## 2012-12-03 ENCOUNTER — Other Ambulatory Visit (HOSPITAL_BASED_OUTPATIENT_CLINIC_OR_DEPARTMENT_OTHER): Payer: 59 | Admitting: Lab

## 2012-12-03 ENCOUNTER — Telehealth: Payer: Self-pay | Admitting: Internal Medicine

## 2012-12-03 ENCOUNTER — Ambulatory Visit: Payer: Medicare PPO | Admitting: Lab

## 2012-12-03 ENCOUNTER — Other Ambulatory Visit (HOSPITAL_COMMUNITY): Payer: Self-pay | Admitting: Nephrology

## 2012-12-03 VITALS — BP 173/98 | HR 92 | Temp 97.0°F | Resp 20 | Ht 68.5 in | Wt 302.4 lb

## 2012-12-03 DIAGNOSIS — N289 Disorder of kidney and ureter, unspecified: Secondary | ICD-10-CM

## 2012-12-03 DIAGNOSIS — D649 Anemia, unspecified: Secondary | ICD-10-CM

## 2012-12-03 DIAGNOSIS — D472 Monoclonal gammopathy: Secondary | ICD-10-CM

## 2012-12-03 HISTORY — DX: Type 2 diabetes mellitus without complications: E11.9

## 2012-12-03 HISTORY — DX: Gout, unspecified: M10.9

## 2012-12-03 HISTORY — DX: Essential (primary) hypertension: I10

## 2012-12-03 LAB — CBC WITH DIFFERENTIAL/PLATELET
Basophils Absolute: 0 10*3/uL (ref 0.0–0.1)
Eosinophils Absolute: 0.2 10*3/uL (ref 0.0–0.5)
HCT: 31.6 % — ABNORMAL LOW (ref 34.8–46.6)
LYMPH%: 23.8 % (ref 14.0–49.7)
MCHC: 32.4 g/dL (ref 31.5–36.0)
MONO#: 0.4 10*3/uL (ref 0.1–0.9)
NEUT%: 65.8 % (ref 38.4–76.8)
Platelets: 228 10*3/uL (ref 145–400)
WBC: 5.7 10*3/uL (ref 3.9–10.3)

## 2012-12-03 LAB — LACTATE DEHYDROGENASE (CC13): LDH: 238 U/L (ref 125–245)

## 2012-12-03 LAB — COMPREHENSIVE METABOLIC PANEL (CC13)
BUN: 29.7 mg/dL — ABNORMAL HIGH (ref 7.0–26.0)
CO2: 26 mEq/L (ref 22–29)
Creatinine: 2.4 mg/dL — ABNORMAL HIGH (ref 0.6–1.1)
Glucose: 113 mg/dl (ref 70–140)
Total Bilirubin: 0.2 mg/dL (ref 0.20–1.20)

## 2012-12-03 MED ORDER — FERUMOXYTOL INJECTION 510 MG/17 ML
1020.0000 mg | Freq: Once | INTRAVENOUS | Status: AC
  Administered 2012-12-03: 1020 mg via INTRAVENOUS
  Filled 2012-12-03 (×2): qty 34

## 2012-12-03 MED ORDER — SODIUM CHLORIDE 0.9 % IV SOLN
Freq: Once | INTRAVENOUS | Status: AC
Start: 2012-12-03 — End: 2012-12-03
  Administered 2012-12-03: 11:00:00 via INTRAVENOUS

## 2012-12-03 NOTE — Telephone Encounter (Signed)
Pt sent back to lb and given appt schedule for October.

## 2012-12-03 NOTE — Progress Notes (Signed)
[place] Telephone:([phone]   Fax:(336) [phone]  CONSULT NOTE  REFERRING PHYSICIAN: Dr. Corliss Parish  REASON FOR CONSULTATION:  57 years old African American female with M-spike  HPI Kristen English is a 57 y.o. female was past medical history significant for anemia, renal insufficiency, hypertension, diabetes mellitus, dyslipidemia, gout as well as bilateral knee replacement. The patient was seen recently by her primary care physician, Dr. Lysle Rubens and on routine blood work she was found to have elevated serum creatinine. The patient was referred to Dr. Moshe Cipro for evaluation of her renal insufficiency. She underwent several blood work including serum protein electrophoreses which showed elevated M spike of 0.2 G./DL. Quantitative immunoglobulins showed IgG was normal at 1041, IgA 413 and IgM 113. She was also found to have elevated total urine protein of 761.9 mg/DL, was elevated protein/creatinine ratio of 4278. During her evaluation the patient was found to have anemia with hemoglobin of 10.1 g/dL. Study showed normal iron level of 59 with low iron binding capacity of 211 and normal iron saturation of 28%. The patient was getting Feraheme infusion earlier today by Dr. Moshe Cipro.  She was referred to me today for further evaluation and recommendation regarding her elevated M spike. When seen today the patient denied having any specific complaints and she is feeling fine. She denied having any significant weight loss and actually concerned about weight gain with occasional night sweats. She denied having any significant chest pain, shortness breath, cough or hemoptysis. She denied having any fatigue or weakness. She denied having any dizzy spells. She mentions that she has anemia for years since she was a teenager.   Her family history significant for a cousin who had lung cancer, father and mother had heart disease and she had a sister with hypertension  and diabetes. No family history of multiple myeloma or any other blood disorder.  The patient is a widowed and has 3 children. She is currently retired and used to work for [employer]. She has no history of smoking, alcohol or drug abuse.   @SFHPI @  Past Medical History  Diagnosis Date  . Hypertension   . Diabetes mellitus without complication   . Gout     Past Surgical History  Procedure Laterality Date  . Foot surgery Left 2007  . Joint replacement  2009    bilateral knees    History reviewed. No pertinent family history.  Social History History  Substance Use Topics  . Smoking status: Never Smoker   . Smokeless tobacco: Never Used  . Alcohol Use: No    Allergies  Allergen Reactions  . Morphine And Related Itching    Current Outpatient Prescriptions  Medication Sig Dispense Refill  . amLODipine (NORVASC) 10 MG tablet Take 10 mg by mouth daily.      Marland Kitchen aspirin 81 MG tablet Take 81 mg by mouth daily.      . cholecalciferol (VITAMIN D) 1000 UNITS tablet Take 2,000 Units by mouth daily.      Marland Kitchen glipiZIDE (GLUCOTROL) 5 MG tablet Take 5 mg by mouth 3 (three) times daily with meals.      . hydrochlorothiazide (HYDRODIURIL) 25 MG tablet Take 25 mg by mouth daily.      . Liraglutide (VICTOZA) 18 MG/3ML SOPN Inject into the skin daily.      . colchicine 0.6 MG tablet Take 0.6 mg by mouth every 12 (twelve) hours as needed.       No current facility-administered medications for this  visit.    Review of Systems  Constitutional: negative Eyes: negative Ears, nose, mouth, throat, and face: negative Respiratory: negative Cardiovascular: negative Gastrointestinal: negative Genitourinary:negative Integument/breast: negative Hematologic/lymphatic: negative Musculoskeletal:negative Neurological: negative Behavioral/Psych: negative Endocrine: negative Allergic/Immunologic: negative  Physical Exam  [id number], healthy, no distress, well nourished and well developed SKIN: skin  color, texture, turgor are normal HEAD: Normocephalic, No masses, lesions, tenderness or abnormalities EYES: normal, PERRLA EARS: External ears normal OROPHARYNX:no exudate, no erythema and lips, buccal mucosa, and tongue normal  NECK: supple, no adenopathy, no JVD LYMPH:  no palpable lymphadenopathy, no hepatosplenomegaly BREAST:not examined LUNGS: clear to auscultation , and palpation HEART: regular rate & rhythm, no murmurs and no gallops ABDOMEN:abdomen soft, non-tender, obese, normal bowel sounds and no masses or organomegaly BACK: Back symmetric, no curvature. EXTREMITIES:no joint deformities, effusion, or inflammation  NEURO: alert & oriented x 3 with fluent speech, no focal motor/sensory deficits  PERFORMANCE STATUS: ECOG 0  LABORATORY DATA: Lab Results  Component Value Date   WBC 7.2 02/24/2008   HGB 9.9 DELTA CHECK NOTED POST TRANSFUSION SPECIMEN* 02/25/2008   HCT 29.6* 02/25/2008   MCV 85.9 02/24/2008   PLT 246 02/24/2008      Chemistry      Component Value Date/Time   NA 138 02/25/2008 0548   K 4.2 02/25/2008 0548   CL 102 02/25/2008 0548   CO2 29 02/25/2008 0548   BUN 21 02/25/2008 0548   CREATININE 1.32* 02/25/2008 0548      Component Value Date/Time   CALCIUM 9.3 02/25/2008 0548   ALKPHOS 97 07/04/2007 2038   AST 21 07/04/2007 2038   ALT 18 07/04/2007 2038   BILITOT 0.3 07/04/2007 2038       RADIOGRAPHIC STUDIES: US Renal  11/08/2012   *RADIOLOGY REPORT*  Clinical Data: Chronic kidney disease  RENAL/URINARY TRACT ULTRASOUND COMPLETE  Comparison:  None.  Findings:  Right Kidney:  9.6 cm.  Negative for obstruction or mass.  Mild increase in cortical echogenicity.  Left Kidney:  9.7 cm.  Negative for obstruction or mass.  Mild increased echogenicity of the cortex.  Bladder:  Negative  IMPRESSION: Negative for renal obstruction or mass.   Original Report Authenticated By: Carl Best, M.D.    ASSESSMENT: this is a very pleasant 57 years old African American female with  history of anemia, renal insufficiency and slightly elevated M-Spike. This could be reactive in nature or secondary to her renal insufficiency or could be a sign for multiple myeloma with renal insufficiency.  PLAN: I have a lengthy discussion with the patient today about her current condition and further investigation to confirm or rule out a diagnosis of multiple myeloma.  I ordered several studies today including repeat CBC, comprehensive metabolic panel, LDH,  and myeloma panel. I also referred the patient to interventional radiology for consideration of bone marrow biopsy and aspirate. We will send the patient to be tested for morphology, cytogenetic, flow cytometry and myeloma panel. If the bone marrow is suspicious for multiple myeloma, I would consider the patient for a skeletal bone survey and probably 24-hour urine protein electrophoresis. The patient would come back for follow up visit in 3 weeks for evaluation and discussion of her biopsy results and further recommendation regarding her condition. The patient was advised to call immediately if she has any concerning symptoms in the interval.  The patient voices understanding of current disease status and treatment options and is in agreement with the current care plan.  All questions were answered. The  patient knows to call the clinic with any problems, questions or concerns. We can certainly see the patient much sooner if necessary.  Thank you so much for allowing me to participate in the care of Kristen English. I will continue to follow up the patient with you and assist in her care.  I spent 40 minutes counseling the patient face to face. The total time spent in the appointment was 60 minutes.  Arjun Hard K. 12/03/2012, 2:47 PM

## 2012-12-04 LAB — KAPPA/LAMBDA LIGHT CHAINS
Kappa:Lambda Ratio: 2.29 — ABNORMAL HIGH (ref 0.26–1.65)
Lambda Free Lght Chn: 5.86 mg/dL — ABNORMAL HIGH (ref 0.57–2.63)

## 2012-12-04 LAB — IGG, IGA, IGM: IgA: 362 mg/dL (ref 69–380)

## 2012-12-04 LAB — BETA 2 MICROGLOBULIN, SERUM: Beta-2 Microglobulin: 6.06 mg/L — ABNORMAL HIGH (ref 1.01–1.73)

## 2012-12-09 ENCOUNTER — Other Ambulatory Visit: Payer: Self-pay | Admitting: Radiology

## 2012-12-10 ENCOUNTER — Encounter (HOSPITAL_COMMUNITY): Payer: Self-pay | Admitting: Pharmacy Technician

## 2012-12-11 ENCOUNTER — Ambulatory Visit (HOSPITAL_COMMUNITY): Admission: RE | Admit: 2012-12-11 | Payer: 59 | Source: Ambulatory Visit

## 2012-12-11 ENCOUNTER — Inpatient Hospital Stay (HOSPITAL_COMMUNITY): Admission: RE | Admit: 2012-12-11 | Payer: 59 | Source: Ambulatory Visit

## 2012-12-12 ENCOUNTER — Ambulatory Visit (HOSPITAL_COMMUNITY)
Admission: RE | Admit: 2012-12-12 | Discharge: 2012-12-12 | Disposition: A | Discharge status: Home / Self Care | Payer: Medicare PPO | Source: Ambulatory Visit | Attending: Internal Medicine | Admitting: Internal Medicine

## 2012-12-12 ENCOUNTER — Encounter (HOSPITAL_COMMUNITY): Payer: Self-pay

## 2012-12-12 DIAGNOSIS — D649 Anemia, unspecified: Secondary | ICD-10-CM | POA: Insufficient documentation

## 2012-12-12 DIAGNOSIS — I10 Essential (primary) hypertension: Secondary | ICD-10-CM | POA: Insufficient documentation

## 2012-12-12 DIAGNOSIS — D472 Monoclonal gammopathy: Secondary | ICD-10-CM

## 2012-12-12 DIAGNOSIS — E119 Type 2 diabetes mellitus without complications: Secondary | ICD-10-CM | POA: Insufficient documentation

## 2012-12-12 HISTORY — DX: Unspecified osteoarthritis, unspecified site: M19.90

## 2012-12-12 HISTORY — DX: Chronic kidney disease, unspecified: N18.9

## 2012-12-12 LAB — PROTIME-INR
INR: 0.9 (ref 0.00–1.49)
Prothrombin Time: 12 seconds (ref 11.6–15.2)

## 2012-12-12 LAB — CBC
HCT: 35.8 % — ABNORMAL LOW (ref 36.0–46.0)
MCV: 85.9 fL (ref 78.0–100.0)
RBC: 4.17 MIL/uL (ref 3.87–5.11)
RDW: 14.2 % (ref 11.5–15.5)
WBC: 4.8 10*3/uL (ref 4.0–10.5)

## 2012-12-12 LAB — GLUCOSE, CAPILLARY: Glucose-Capillary: 122 mg/dL — ABNORMAL HIGH (ref 70–99)

## 2012-12-12 LAB — APTT: aPTT: 31 seconds (ref 24–37)

## 2012-12-12 LAB — BONE MARROW EXAM

## 2012-12-12 MED ORDER — MIDAZOLAM HCL 2 MG/2ML IJ SOLN
INTRAMUSCULAR | Status: AC | PRN
  Administered 2012-12-12 (×3): 1 mg via INTRAVENOUS
  Administered 2012-12-12 (×2): 0.5 mg via INTRAVENOUS

## 2012-12-12 MED ORDER — HYDROCODONE-ACETAMINOPHEN 5-325 MG PO TABS
1.0000 | ORAL_TABLET | ORAL | Status: DC | PRN
  Filled 2012-12-12: qty 2

## 2012-12-12 MED ORDER — FENTANYL CITRATE 0.05 MG/ML IJ SOLN
INTRAMUSCULAR | Status: AC
  Filled 2012-12-12: qty 6

## 2012-12-12 MED ORDER — SODIUM CHLORIDE 0.9 % IV SOLN
INTRAVENOUS | Status: DC
  Administered 2012-12-12: 09:00:00 via INTRAVENOUS

## 2012-12-12 MED ORDER — MIDAZOLAM HCL 2 MG/2ML IJ SOLN
INTRAMUSCULAR | Status: AC
  Filled 2012-12-12: qty 6

## 2012-12-12 MED ORDER — FENTANYL CITRATE 0.05 MG/ML IJ SOLN
INTRAMUSCULAR | Status: AC | PRN
  Administered 2012-12-12 (×2): 50 ug via INTRAVENOUS
  Administered 2012-12-12: 100 ug via INTRAVENOUS

## 2012-12-12 NOTE — Procedures (Signed)
Interventional Radiology Procedure Note  Procedure: CT guided aspirate and core biopsy of right iliac bone Complications: None Recommendations: - Bedrest supine x 2 hrs - Hydrocodone PRN  Pain - Follow biopsy results  Signed,  Doratha Mcswain K. Jery Hollern, MD Vascular & Interventional Radiologist [facility]  

## 2012-12-12 NOTE — H&P (Signed)
Chief Complaint: "I'm here for a bone marrow biopsy" Referring Physician:Mohamed HPI: Kristen English is an 57 y.o. female with abnormal lab workup for anemia with proteinuria and an M-spike on electrophoresis. She is referred and scheduled for IR to do BM biopsy. PMHx and meds reviewed.  Past Medical History:  Past Medical History  Diagnosis Date  . Hypertension   . Diabetes mellitus without complication   . Gout   . Chronic kidney disease     protein in urine  . Arthritis     Past Surgical History:  Past Surgical History  Procedure Laterality Date  . Foot surgery Left 2007  . Joint replacement  2009    bilateral knees  . Total knee arthroplasty      bilateral    Family History: No family history on file.  Social History:  reports that she has never smoked. She has never used smokeless tobacco. She reports that she does not drink alcohol or use illicit drugs.  Allergies:  Allergies  Allergen Reactions  . Morphine And Related Itching    Medications:   Medication List    ASK your doctor about these medications       amLODipine 10 MG tablet  Commonly known as:  NORVASC  Take 10 mg by mouth at bedtime.     aspirin 81 MG tablet  Take 81 mg by mouth daily.     cholecalciferol 1000 UNITS tablet  Commonly known as:  VITAMIN D  Take 2,000 Units by mouth daily.     colchicine 0.6 MG tablet  Take 0.6 mg by mouth every 12 (twelve) hours as needed.     glipiZIDE 5 MG tablet  Commonly known as:  GLUCOTROL  Take 5 mg by mouth 3 (three) times daily with meals.     hydrochlorothiazide 25 MG tablet  Commonly known as:  HYDRODIURIL  Take 25 mg by mouth daily.     VICTOZA 18 MG/3ML Sopn  Generic drug:  Liraglutide  Inject 1.8 mg into the skin daily.        Please HPI for pertinent positives, otherwise complete 10 system ROS negative.  Physical Exam: BP 176/87  Pulse 80  Temp(Src) 98.1 F (36.7 C) (Oral)  Resp 22  Wt 302 lb 6 oz (137.156 kg)  BMI 45.3  kg/m2  SpO2 100% Body mass index is 45.3 kg/(m^2).   General Appearance:  Alert, cooperative, no distress, appears stated age  Head:  Normocephalic, without obvious abnormality, atraumatic  ENT: Unremarkable  Neck: Supple, symmetrical, trachea midline  Lungs:   Clear to auscultation bilaterally, no w/r/r, respirations unlabored without use of accessory muscles.  Chest Wall:  No tenderness or deformity  Heart:  Regular rate and rhythm, S1, S2 normal, no murmur, rub or gallop.  Neurologic: Normal affect, no gross deficits.   Results for orders placed during the hospital encounter of 12/12/12 (from the past 48 hour(s))  APTT     Status: None   Collection Time    12/12/12  9:25 AM      Result Value Range   aPTT 31  24 - 37 seconds  CBC     Status: Abnormal   Collection Time    12/12/12  9:25 AM      Result Value Range   WBC 4.8  4.0 - 10.5 K/uL   RBC 4.17  3.87 - 5.11 MIL/uL   Hemoglobin 11.5 (*) 12.0 - 15.0 g/dL   HCT 35.8 (*) 36.0 - 46.0 %  MCV 85.9  78.0 - 100.0 fL   MCH 27.6  26.0 - 34.0 pg   MCHC 32.1  30.0 - 36.0 g/dL   RDW 14.2  11.5 - 15.5 %   Platelets 239  150 - 400 K/uL  PROTIME-INR     Status: None   Collection Time    12/12/12  9:25 AM      Result Value Range   Prothrombin Time 12.0  11.6 - 15.2 seconds   INR 0.90  0.00 - 1.49  GLUCOSE, CAPILLARY     Status: Abnormal   Collection Time    12/12/12  9:32 AM      Result Value Range   Glucose-Capillary 122 (*) 70 - 99 mg/dL   No results found.  Assessment/Plan Anemia, proteinuria, elevated M-spike For CT guided BM biopsy today. Discussed procedure, risks, complications, use of sedation. Labs reviewed. Consent signed in chart  Ascencion Dike PA-C 12/12/2012, 10:47 AM

## 2012-12-12 NOTE — H&P (Signed)
Agree with PA note.  Signed,  Trei Schoch K. Araina Butrick, MD Vascular & Interventional Radiology Specialists [facility]  

## 2012-12-23 ENCOUNTER — Encounter: Payer: Self-pay | Admitting: Internal Medicine

## 2012-12-23 ENCOUNTER — Ambulatory Visit (HOSPITAL_BASED_OUTPATIENT_CLINIC_OR_DEPARTMENT_OTHER): Payer: 59 | Admitting: Internal Medicine

## 2012-12-23 ENCOUNTER — Telehealth: Payer: Self-pay | Admitting: Internal Medicine

## 2012-12-23 VITALS — BP 159/97 | HR 97 | Temp 98.3°F | Resp 18 | Ht 68.5 in | Wt 301.5 lb

## 2012-12-23 DIAGNOSIS — D472 Monoclonal gammopathy: Secondary | ICD-10-CM

## 2012-12-23 NOTE — Progress Notes (Signed)
[place] [phone]   Fax:(336) [employer], [address] [place] [address]  DIAGNOSIS: Monoclonal gammopathy of undetermined significance  PRIOR THERAPY: None  CURRENT THERAPY: Observation  INTERVAL HISTORY: Kristen English 57 y.o. female returns to the clinic today for followup visit. The patient is feeling fine today with no specific complaints. She denied having any significant weight loss or night sweats. She has no nausea or vomiting. The patient denied having any aches and pains. She has no chest pain, shortness breath, cough or hemoptysis. She recently underwent myeloma panel as well as bone marrow biopsy and aspirate and she is here for evaluation and discussion of her biopsy and lab results.  MEDICAL HISTORY: Past Medical History  Diagnosis Date  . Hypertension   . Diabetes mellitus without complication   . Gout   . Chronic kidney disease     protein in urine  . Arthritis     ALLERGIES:  is allergic to morphine and related.  MEDICATIONS:  Current Outpatient Prescriptions  Medication Sig Dispense Refill  . amLODipine (NORVASC) 10 MG tablet Take 10 mg by mouth at bedtime.       Marland Kitchen aspirin 81 MG tablet Take 81 mg by mouth daily.      . cholecalciferol (VITAMIN D) 1000 UNITS tablet Take 2,000 Units by mouth daily.      . colchicine 0.6 MG tablet Take 0.6 mg by mouth every 12 (twelve) hours as needed.      Marland Kitchen glipiZIDE (GLUCOTROL) 5 MG tablet Take 5 mg by mouth 3 (three) times daily with meals.      . hydrochlorothiazide (HYDRODIURIL) 25 MG tablet Take 25 mg by mouth daily.      . Liraglutide (VICTOZA) 18 MG/3ML SOPN Inject 1.8 mg into the skin daily.        No current facility-administered medications for this visit.    SURGICAL HISTORY:  Past Surgical History  Procedure Laterality Date  . Foot surgery Left 2007  . Joint replacement  2009    bilateral knees  . Total  knee arthroplasty      bilateral    REVIEW OF SYSTEMS:  Constitutional: negative Eyes: negative Ears, nose, mouth, throat, and face: negative Respiratory: negative Cardiovascular: negative Gastrointestinal: negative Genitourinary:negative Integument/breast: negative Hematologic/lymphatic: negative Musculoskeletal:negative Neurological: negative Behavioral/Psych: negative Endocrine: negative Allergic/Immunologic: negative   PHYSICAL EXAMINATION: General appearance: alert, cooperative and no distress Head: Normocephalic, without obvious abnormality, atraumatic Neck: no adenopathy, no JVD, supple, symmetrical, trachea midline and thyroid not enlarged, symmetric, no tenderness/mass/nodules Lymph nodes: Cervical, supraclavicular, and axillary nodes normal. Resp: clear to auscultation bilaterally Back: symmetric, no curvature. ROM normal. No CVA tenderness. Cardio: regular rate and rhythm, S1, S2 normal, no murmur, click, rub or gallop GI: soft, non-tender; bowel sounds normal; no masses,  no organomegaly Extremities: extremities normal, atraumatic, no cyanosis or edema Neurologic: Alert and oriented X 3, normal strength and tone. Normal symmetric reflexes. Normal coordination and gait  ECOG PERFORMANCE STATUS: 1 - Symptomatic but completely ambulatory  Blood pressure 159/97, pulse 97, temperature 98.3 F (36.8 C), temperature source Oral, resp. rate 17, height 5' 8.5" (1.74 m), weight 301 lb 8 oz (136.76 kg).  LABORATORY DATA: Lab Results  Component Value Date   WBC 4.8 12/12/2012   HGB 11.5* 12/12/2012   HCT 35.8* 12/12/2012   MCV 85.9 12/12/2012   PLT 239 12/12/2012      Chemistry  Component Value Date/Time   NA 142 12/03/2012 1501   NA 138 02/25/2008 0548   K 3.5 12/03/2012 1501   K 4.2 02/25/2008 0548   CL 102 02/25/2008 0548   CO2 26 12/03/2012 1501   CO2 29 02/25/2008 0548   BUN 29.7* 12/03/2012 1501   BUN 21 02/25/2008 0548   CREATININE 2.4* 12/03/2012 1501    CREATININE 1.32* 02/25/2008 0548      Component Value Date/Time   CALCIUM 9.3 12/03/2012 1501   CALCIUM 9.3 02/25/2008 0548   ALKPHOS 96 12/03/2012 1501   ALKPHOS 97 07/04/2007 2038   AST 26 12/03/2012 1501   AST 21 07/04/2007 2038   ALT 21 12/03/2012 1501   ALT 18 07/04/2007 2038   BILITOT 0.20 12/03/2012 1501   BILITOT 0.3 07/04/2007 2038       RADIOGRAPHIC STUDIES: Ct Biopsy  12/12/2012   *RADIOLOGY REPORT*  CT GUIDED RIGHT ILIAC BONE MARROW ASPIRATION AND BONE MARROW CORE BIOPSIES  Date:  12/12/2012  Clinical History: 57 year old female with M-spike on protein electrophoresis.  Procedures Performed:  1. CT guided bone marrow aspiration and core biopsy  Interventional Radiologist:  Criselda Peaches, MD  Sedation: Moderate (conscious) sedation was used.  Four mg Versed, 200 mcg Fentanyl were administered intravenously.  The patient's vital signs were monitored continuously by radiology nursing throughout the procedure.  Sedation Time: 28  minutes  Fluoroscopy time: None  PROCEDURE/FINDINGS:  Informed consent was obtained from the patient following explanation of the procedure, risks, benefits and alternatives. The patient understands, agrees and consents for the procedure. All questions were addressed.  A time out was performed.  The patient was positioned prone and noncontrast localization CT was performed of the pelvis to demonstrate the iliac marrow spaces.  Maximal barrier sterile technique utilized including caps, mask, sterile gowns, sterile gloves, large sterile drape, hand hygiene, and betadine prep.  Under sterile conditions and local anesthesia, an 11 gauge coaxial bone biopsy needle was advanced into the right iliac marrow space. Needle position was confirmed with CT imaging. Initially, bone marrow aspiration was performed. Next, the 11 gauge outer cannula was utilized to obtain a right iliac bone marrow core biopsy. Needle was removed. Hemostasis was obtained with compression. The patient  tolerated the procedure well. Samples were prepared with the cytotechnologist. No immediate complications.  IMPRESSION:  CT guided right iliac bone marrow aspiration and core biopsy.  Signed,  Criselda Peaches, MD Vascular & Interventional Radiology Specialists [facility]   Original Report Authenticated By: Jacqulynn Cadet, M.D.   BONE MARROW REPORT FINAL DIAGNOSIS Diagnosis Bone Marrow, Aspirate,Biopsy, and Clot, right iliac - SLIGHTLY HYPERCELLULAR BONE MARROW FOR AGE WITH TRILINEAGE HEMATOPOIESIS AND 4% PLASMA CELLS. - SEE COMMENT. PERIPHERAL BLOOD: - NORMOCYTIC-NORMOCHROMIC ANEMIA. Diagnosis Note The bone marrow is slightly hypercellular for age with trilineage hematopoiesis and nonspecific changes. The plasma cells represent 4% of all cells with lack of large aggregates or sheets. Immunohistochemical stains show background staining with kappa and lambda light chains that hinder overall assessment. In small, relatively well stained areas, the plasma cells show a polyclonal staining pattern for kappa and lambda light chains. The appearance is nonspecific and nondiagnostic of plasma cell dyscrasia. Clinical correlation is recommended. (BNS:ecj 12/16/2012) Susanne Greenhouse MD Pathologist, Electronic Signature (Case signed 12/16/2012)   ASSESSMENT AND PLAN: This is a very pleasant 57 years old [place] American female recently diagnosed with monoclonal gammopathy of undetermined significance. The bone marrow biopsy and aspirate that was performed recently showed no clear  evidence for plasma cell dyscrasia and the bone marrow contains only 4% plasma cells. Her myeloma panel showed elevated beta-2 microglobulin of 6.06, and, elevated free kappa light chain 13.40 and elevated free lambda light chain of 5.86. The quantitative immunoglobulins showed normal IgG, IgA and IgM. I discussed the lab result with the patient today and recommended for her to continue on observation with repeat  myeloma panel in 3 months She was advised to call immediately if she has any concerning symptoms in the interval.  The patient voices understanding of current disease status and treatment options and is in agreement with the current care plan.  All questions were answered. The patient knows to call the clinic with any problems, questions or concerns. We can certainly see the patient much sooner if necessary.  I spent 15 minutes counseling the patient face to face. The total time spent in the appointment was 25 minutes.

## 2012-12-23 NOTE — Patient Instructions (Signed)
Continue on observation with repeat myeloma panel in 3 months

## 2012-12-23 NOTE — Telephone Encounter (Signed)
GV AND PRINTED APPT SCHED AND AVS FOR OPT FOR jAN 2015

## 2012-12-26 ENCOUNTER — Telehealth: Payer: Self-pay | Admitting: *Deleted

## 2012-12-26 NOTE — Telephone Encounter (Signed)
Cytogenetic labs from the [facility] given to Dr Vista Mink to review.  SLJ

## 2013-01-23 ENCOUNTER — Other Ambulatory Visit: Payer: Self-pay

## 2013-03-19 ENCOUNTER — Other Ambulatory Visit (HOSPITAL_BASED_OUTPATIENT_CLINIC_OR_DEPARTMENT_OTHER): Payer: 59

## 2013-03-19 ENCOUNTER — Other Ambulatory Visit: Payer: Self-pay | Admitting: *Deleted

## 2013-03-19 ENCOUNTER — Other Ambulatory Visit: Payer: Self-pay | Admitting: Internal Medicine

## 2013-03-19 DIAGNOSIS — D472 Monoclonal gammopathy: Secondary | ICD-10-CM

## 2013-03-19 LAB — CBC WITH DIFFERENTIAL/PLATELET
BASO%: 0.6 % (ref 0.0–2.0)
LYMPH%: 25 % (ref 14.0–49.7)
MCH: 27.8 pg (ref 25.1–34.0)
MCHC: 31.9 g/dL (ref 31.5–36.0)
MCV: 87 fL (ref 79.5–101.0)
MONO#: 0.4 10*3/uL (ref 0.1–0.9)
MONO%: 8.5 % (ref 0.0–14.0)
NEUT#: 2.6 10*3/uL (ref 1.5–6.5)
Platelets: 214 10*3/uL (ref 145–400)
RBC: 3.87 10*6/uL (ref 3.70–5.45)
RDW: 13.8 % (ref 11.2–14.5)
WBC: 4.3 10*3/uL (ref 3.9–10.3)

## 2013-03-19 LAB — COMPREHENSIVE METABOLIC PANEL (CC13)
ALT: 20 U/L (ref 0–55)
Albumin: 2.5 g/dL — ABNORMAL LOW (ref 3.5–5.0)
Alkaline Phosphatase: 104 U/L (ref 40–150)
Anion Gap: 12 mEq/L — ABNORMAL HIGH (ref 3–11)
CO2: 23 mEq/L (ref 22–29)
Calcium: 9.1 mg/dL (ref 8.4–10.4)
Potassium: 3.7 mEq/L (ref 3.5–5.1)
Sodium: 143 mEq/L (ref 136–145)
Total Bilirubin: 0.23 mg/dL (ref 0.20–1.20)
Total Protein: 6.9 g/dL (ref 6.4–8.3)

## 2013-03-19 NOTE — Progress Notes (Signed)
Per Dr Vista Mink, need to add 24hr urine collection to lab studies drawn today.  Pt is aware and will come back to pick up 24h hr urine containter.  Pt will collect sample tomorrow and return back Friday, 03/21/13.  SLJ

## 2013-03-21 ENCOUNTER — Ambulatory Visit: Payer: 59

## 2013-03-21 DIAGNOSIS — D890 Polyclonal hypergammaglobulinemia: Principal | ICD-10-CM

## 2013-03-21 DIAGNOSIS — D472 Monoclonal gammopathy: Secondary | ICD-10-CM

## 2013-03-21 LAB — IGG, IGA, IGM
IgA: 466 mg/dL — ABNORMAL HIGH (ref 69–380)
IgG (Immunoglobin G), Serum: 1130 mg/dL (ref 690–1700)
IgM, Serum: 111 mg/dL (ref 52–322)

## 2013-03-21 LAB — KAPPA/LAMBDA LIGHT CHAINS
Kappa free light chain: 18.4 mg/dL — ABNORMAL HIGH (ref 0.33–1.94)
Kappa:Lambda Ratio: 2.21 — ABNORMAL HIGH (ref 0.26–1.65)
Lambda Free Lght Chn: 8.32 mg/dL — ABNORMAL HIGH (ref 0.57–2.63)

## 2013-03-21 LAB — BETA 2 MICROGLOBULIN, SERUM: Beta-2 Microglobulin: 7.09 mg/L — ABNORMAL HIGH (ref 1.01–1.73)

## 2013-03-24 ENCOUNTER — Encounter: Payer: Self-pay | Admitting: Internal Medicine

## 2013-03-24 ENCOUNTER — Ambulatory Visit (HOSPITAL_BASED_OUTPATIENT_CLINIC_OR_DEPARTMENT_OTHER): Payer: 59 | Admitting: Internal Medicine

## 2013-03-24 ENCOUNTER — Telehealth: Payer: Self-pay | Admitting: Internal Medicine

## 2013-03-24 VITALS — BP 136/87 | HR 83 | Temp 97.5°F | Resp 18 | Ht 68.0 in | Wt 295.4 lb

## 2013-03-24 DIAGNOSIS — D890 Polyclonal hypergammaglobulinemia: Principal | ICD-10-CM

## 2013-03-24 DIAGNOSIS — D472 Monoclonal gammopathy: Secondary | ICD-10-CM

## 2013-03-24 NOTE — Progress Notes (Signed)
[place] [phone]   Fax:(336) [employer], [address] [place] [address]  DIAGNOSIS: Monoclonal gammopathy of undetermined significance  PRIOR THERAPY: None  CURRENT THERAPY: Observation  INTERVAL HISTORY: Kristen English 58 y.o. female returns to the clinic today for followup visit. The patient is feeling fine today with no specific complaints. She denied having any significant weight loss or night sweats. She has no nausea or vomiting. The patient denied having any aches and pains. She has no chest pain, shortness breath, cough or hemoptysis. Her renal function has been deteriorating recently and the patient is followed closely by Dr. Moshe Cipro. She had repeat myeloma panel performed recently and she is here for evaluation and discussion of her lab results.  MEDICAL HISTORY: Past Medical History  Diagnosis Date  . Hypertension   . Diabetes mellitus without complication   . Gout   . Chronic kidney disease     protein in urine  . Arthritis     ALLERGIES:  is allergic to morphine and related.  MEDICATIONS:  Current Outpatient Prescriptions  Medication Sig Dispense Refill  . amLODipine (NORVASC) 10 MG tablet Take 10 mg by mouth at bedtime.       Marland Kitchen aspirin 81 MG tablet Take 81 mg by mouth daily.      . carvedilol (COREG) 25 MG tablet Take 25 mg by mouth daily.      . cholecalciferol (VITAMIN D) 1000 UNITS tablet Take 2,000 Units by mouth daily.      . colchicine 0.6 MG tablet Take 0.6 mg by mouth every 12 (twelve) hours as needed.      Marland Kitchen glipiZIDE (GLUCOTROL) 5 MG tablet Take 5 mg by mouth 3 (three) times daily with meals.      . hydrochlorothiazide (HYDRODIURIL) 25 MG tablet Take 25 mg by mouth daily.      . hydrOXYzine (ATARAX/VISTARIL) 25 MG tablet Take 25 mg by mouth at bedtime as needed.      . Liraglutide (VICTOZA) 18 MG/3ML SOPN Inject 1.8 mg into the skin daily.         Marland Kitchen lovastatin (MEVACOR) 10 MG tablet Take 10 mg by mouth.       No current facility-administered medications for this visit.    SURGICAL HISTORY:  Past Surgical History  Procedure Laterality Date  . Foot surgery Left 2007  . Joint replacement  2009    bilateral knees  . Total knee arthroplasty      bilateral    REVIEW OF SYSTEMS:  Constitutional: negative Eyes: negative Ears, nose, mouth, throat, and face: negative Respiratory: negative Cardiovascular: negative Gastrointestinal: negative Genitourinary:negative Integument/breast: negative Hematologic/lymphatic: negative Musculoskeletal:negative Neurological: negative Behavioral/Psych: negative Endocrine: negative Allergic/Immunologic: negative   PHYSICAL EXAMINATION: General appearance: alert, cooperative and no distress Head: Normocephalic, without obvious abnormality, atraumatic Neck: no adenopathy, no JVD, supple, symmetrical, trachea midline and thyroid not enlarged, symmetric, no tenderness/mass/nodules Lymph nodes: Cervical, supraclavicular, and axillary nodes normal. Resp: clear to auscultation bilaterally Back: symmetric, no curvature. ROM normal. No CVA tenderness. Cardio: regular rate and rhythm, S1, S2 normal, no murmur, click, rub or gallop GI: soft, non-tender; bowel sounds normal; no masses,  no organomegaly Extremities: extremities normal, atraumatic, no cyanosis or edema Neurologic: Alert and oriented X 3, normal strength and tone. Normal symmetric reflexes. Normal coordination and gait  ECOG PERFORMANCE STATUS: 1 - Symptomatic but completely ambulatory  Blood pressure 136/87, pulse 83, temperature  97.5 F (36.4 C), temperature source Oral, resp. rate 18, height $RemoveBe'5\' 8"'oCdYIiijZ$  (1.727 m), weight 295 lb 6.4 oz (133.993 kg), SpO2 97.00%.  LABORATORY DATA: Lab Results  Component Value Date   WBC 4.3 03/19/2013   HGB 10.8* 03/19/2013   HCT 33.7* 03/19/2013   MCV 87.0 03/19/2013   PLT 214 03/19/2013       Chemistry      Component Value Date/Time   NA 143 03/19/2013 1304   NA 138 02/25/2008 0548   K 3.7 03/19/2013 1304   K 4.2 02/25/2008 0548   CL 102 02/25/2008 0548   CO2 23 03/19/2013 1304   CO2 29 02/25/2008 0548   BUN 29.7* 03/19/2013 1304   BUN 21 02/25/2008 0548   CREATININE 3.0* 03/19/2013 1304   CREATININE 1.32* 02/25/2008 0548      Component Value Date/Time   CALCIUM 9.1 03/19/2013 1304   CALCIUM 9.3 02/25/2008 0548   ALKPHOS 104 03/19/2013 1304   ALKPHOS 97 07/04/2007 2038   AST 27 03/19/2013 1304   AST 21 07/04/2007 2038   ALT 20 03/19/2013 1304   ALT 18 07/04/2007 2038   BILITOT 0.23 03/19/2013 1304   BILITOT 0.3 07/04/2007 2038       RADIOGRAPHIC STUDIES: ONE MARROW REPORT FINAL DIAGNOSIS Diagnosis Bone Marrow, Aspirate,Biopsy, and Clot, right iliac - SLIGHTLY HYPERCELLULAR BONE MARROW FOR AGE WITH TRILINEAGE HEMATOPOIESIS AND 4% PLASMA CELLS. - SEE COMMENT. PERIPHERAL BLOOD: - NORMOCYTIC-NORMOCHROMIC ANEMIA. Diagnosis Note The bone marrow is slightly hypercellular for age with trilineage hematopoiesis and nonspecific changes. The plasma cells represent 4% of all cells with lack of large aggregates or sheets. Immunohistochemical stains show background staining with kappa and lambda light chains that hinder overall assessment. In small, relatively well stained areas, the plasma cells show a polyclonal staining pattern for kappa and lambda light chains. The appearance is nonspecific and nondiagnostic of plasma cell dyscrasia. Clinical correlation is recommended. (BNS:ecj 12/16/2012) Susanne Greenhouse MD Pathologist, Electronic Signature (Case signed 12/16/2012)   ASSESSMENT AND PLAN: This is a very pleasant 58 years old [place] American female recently diagnosed with monoclonal gammopathy of undetermined significance. The bone marrow biopsy and aspirate that was performed recently showed no clear evidence for plasma cell dyscrasia and the bone marrow contains only 4% plasma  cells. Her myeloma panel showed mild increase in the free lambda and kappa light chains but the patient continues to have deterioration of her renal function. I ordered a 24-hour urine protein electrophoreses. If the patient has significant light chain proteinuria, I may consider her for treatment with multiple myeloma regimen.  I discussed the lab result with the patient today and recommended for her to continue on observation with repeat myeloma panel in 3 months unless there is significant abnormality in the 24 hour urine protein electrophoreses that requires immediate treatment. She was advised to call immediately if she has any concerning symptoms in the interval.  The patient voices understanding of current disease status and treatment options and is in agreement with the current care plan.  All questions were answered. The patient knows to call the clinic with any problems, questions or concerns. We can certainly see the patient much sooner if necessary.

## 2013-03-24 NOTE — Patient Instructions (Signed)
Followup visit in 3 months with repeat myeloma panel.

## 2013-03-24 NOTE — Telephone Encounter (Signed)
gave pt appt for lab before Md visit on April 2015

## 2013-03-25 LAB — UPEP/TP, 24-HR URINE
ALPHA-2-GLOBULIN, U: 4.9 %
Albumin: 60.5 %
Alpha-1-Globulin, U: 12.6 %
BETA GLOBULIN, U: 13.5 %
Collection Interval: 24 hours
Gamma Globulin, U: 8.5 %
TOTAL VOLUME, URINE: 2300 mL
Total Protein, Urine/Day: 12052 mg/d — ABNORMAL HIGH (ref 50–100)
Total Protein, Urine: 524 mg/dL

## 2013-03-31 ENCOUNTER — Other Ambulatory Visit: Payer: Self-pay | Admitting: Obstetrics and Gynecology

## 2013-03-31 ENCOUNTER — Other Ambulatory Visit (HOSPITAL_COMMUNITY)
Admission: RE | Admit: 2013-03-31 | Discharge: 2013-03-31 | Disposition: A | Discharge status: Home / Self Care | Payer: Medicare PPO | Source: Ambulatory Visit | Attending: Obstetrics and Gynecology | Admitting: Obstetrics and Gynecology

## 2013-03-31 DIAGNOSIS — Z124 Encounter for screening for malignant neoplasm of cervix: Secondary | ICD-10-CM | POA: Insufficient documentation

## 2013-03-31 DIAGNOSIS — Z1151 Encounter for screening for human papillomavirus (HPV): Secondary | ICD-10-CM | POA: Insufficient documentation

## 2013-06-20 ENCOUNTER — Other Ambulatory Visit (HOSPITAL_BASED_OUTPATIENT_CLINIC_OR_DEPARTMENT_OTHER): Payer: 59

## 2013-06-20 DIAGNOSIS — D472 Monoclonal gammopathy: Secondary | ICD-10-CM

## 2013-06-20 DIAGNOSIS — D890 Polyclonal hypergammaglobulinemia: Principal | ICD-10-CM

## 2013-06-20 LAB — COMPREHENSIVE METABOLIC PANEL (CC13)
ALT: 15 U/L (ref 0–55)
AST: 26 U/L (ref 5–34)
Albumin: 2.6 g/dL — ABNORMAL LOW (ref 3.5–5.0)
Alkaline Phosphatase: 84 U/L (ref 40–150)
Anion Gap: 10 mEq/L (ref 3–11)
BUN: 31.6 mg/dL — ABNORMAL HIGH (ref 7.0–26.0)
CALCIUM: 9.4 mg/dL (ref 8.4–10.4)
CHLORIDE: 108 meq/L (ref 98–109)
CO2: 24 mEq/L (ref 22–29)
Creatinine: 2.8 mg/dL — ABNORMAL HIGH (ref 0.6–1.1)
Glucose: 113 mg/dl (ref 70–140)
Potassium: 3.3 mEq/L — ABNORMAL LOW (ref 3.5–5.1)
SODIUM: 143 meq/L (ref 136–145)
TOTAL PROTEIN: 6.8 g/dL (ref 6.4–8.3)
Total Bilirubin: 0.3 mg/dL (ref 0.20–1.20)

## 2013-06-20 LAB — CBC WITH DIFFERENTIAL/PLATELET
BASO%: 0.7 % (ref 0.0–2.0)
Basophils Absolute: 0 10*3/uL (ref 0.0–0.1)
EOS%: 5.4 % (ref 0.0–7.0)
Eosinophils Absolute: 0.2 10*3/uL (ref 0.0–0.5)
HCT: 31.9 % — ABNORMAL LOW (ref 34.8–46.6)
HGB: 10.2 g/dL — ABNORMAL LOW (ref 11.6–15.9)
LYMPH#: 1 10*3/uL (ref 0.9–3.3)
LYMPH%: 22 % (ref 14.0–49.7)
MCH: 27.3 pg (ref 25.1–34.0)
MCHC: 31.9 g/dL (ref 31.5–36.0)
MCV: 85.5 fL (ref 79.5–101.0)
MONO#: 0.3 10*3/uL (ref 0.1–0.9)
MONO%: 7.6 % (ref 0.0–14.0)
NEUT#: 2.9 10*3/uL (ref 1.5–6.5)
NEUT%: 64.3 % (ref 38.4–76.8)
Platelets: 214 10*3/uL (ref 145–400)
RBC: 3.73 10*6/uL (ref 3.70–5.45)
RDW: 13.6 % (ref 11.2–14.5)
WBC: 4.5 10*3/uL (ref 3.9–10.3)

## 2013-06-20 LAB — LACTATE DEHYDROGENASE (CC13): LDH: 221 U/L (ref 125–245)

## 2013-06-23 ENCOUNTER — Telehealth: Payer: Self-pay | Admitting: Internal Medicine

## 2013-06-23 ENCOUNTER — Ambulatory Visit (HOSPITAL_BASED_OUTPATIENT_CLINIC_OR_DEPARTMENT_OTHER): Payer: 59 | Admitting: Internal Medicine

## 2013-06-23 ENCOUNTER — Encounter: Payer: Self-pay | Admitting: Internal Medicine

## 2013-06-23 ENCOUNTER — Other Ambulatory Visit: Payer: 59

## 2013-06-23 VITALS — BP 152/87 | HR 107 | Temp 97.8°F | Resp 19 | Ht 68.0 in | Wt 298.8 lb

## 2013-06-23 DIAGNOSIS — D890 Polyclonal hypergammaglobulinemia: Principal | ICD-10-CM

## 2013-06-23 DIAGNOSIS — D472 Monoclonal gammopathy: Secondary | ICD-10-CM

## 2013-06-23 NOTE — Progress Notes (Signed)
[facility] Telephone:([phone]   Fax:(336) [place] [employer], [address] [place] [address]  DIAGNOSIS: Monoclonal gammopathy of undetermined significance  PRIOR THERAPY: None  CURRENT THERAPY: Observation  INTERVAL HISTORY: Kristen English 58 y.o. female returns to the clinic today for three-month followup visit. The patient is feeling fine today with no specific complaints. She denied having any significant weight loss or night sweats. She has no nausea or vomiting. The patient denied having any aches and pains. She has no chest pain, shortness of breath, cough or hemoptysis. She had repeat myeloma panel performed recently and she is here for evaluation and discussion of her lab results. The previous 24-hour urine protein electrophoresis shows no detectable monoclonal gammopathy.  MEDICAL HISTORY: Past Medical History  Diagnosis Date  . Hypertension   . Diabetes mellitus without complication   . Gout   . Chronic kidney disease     protein in urine  . Arthritis     ALLERGIES:  is allergic to morphine and related.  MEDICATIONS:  Current Outpatient Prescriptions  Medication Sig Dispense Refill  . amLODipine (NORVASC) 10 MG tablet Take 10 mg by mouth at bedtime.       Marland Kitchen aspirin 81 MG tablet Take 81 mg by mouth daily.      . carvedilol (COREG) 25 MG tablet Take 25 mg by mouth daily.      . cholecalciferol (VITAMIN D) 1000 UNITS tablet Take 2,000 Units by mouth daily.      . colchicine 0.6 MG tablet Take 0.6 mg by mouth every 12 (twelve) hours as needed.      Marland Kitchen glipiZIDE (GLUCOTROL) 5 MG tablet Take 5 mg by mouth 3 (three) times daily with meals.      . hydrochlorothiazide (HYDRODIURIL) 25 MG tablet Take 25 mg by mouth daily.      . hydrOXYzine (ATARAX/VISTARIL) 25 MG tablet Take 25 mg by mouth at bedtime as needed.      . Liraglutide (VICTOZA) 18 MG/3ML SOPN Inject 1.8 mg into the skin daily.        Marland Kitchen lovastatin (MEVACOR) 10 MG tablet Take 10 mg by mouth.       No current facility-administered medications for this visit.    SURGICAL HISTORY:  Past Surgical History  Procedure Laterality Date  . Foot surgery Left 2007  . Joint replacement  2009    bilateral knees  . Total knee arthroplasty      bilateral    REVIEW OF SYSTEMS:  Constitutional: negative Eyes: negative Ears, nose, mouth, throat, and face: negative Respiratory: negative Cardiovascular: negative Gastrointestinal: negative Genitourinary:negative Integument/breast: negative Hematologic/lymphatic: negative Musculoskeletal:negative Neurological: negative Behavioral/Psych: negative Endocrine: negative Allergic/Immunologic: negative   PHYSICAL EXAMINATION: General appearance: alert, cooperative and no distress Head: Normocephalic, without obvious abnormality, atraumatic Neck: no adenopathy, no JVD, supple, symmetrical, trachea midline and thyroid not enlarged, symmetric, no tenderness/mass/nodules Lymph nodes: Cervical, supraclavicular, and axillary nodes normal. Resp: clear to auscultation bilaterally Back: symmetric, no curvature. ROM normal. No CVA tenderness. Cardio: regular rate and rhythm, S1, S2 normal, no murmur, click, rub or gallop GI: soft, non-tender; bowel sounds normal; no masses,  no organomegaly Extremities: extremities normal, atraumatic, no cyanosis or edema Neurologic: Alert and oriented X 3, normal strength and tone. Normal symmetric reflexes. Normal coordination and gait  ECOG PERFORMANCE STATUS: 1 - Symptomatic but completely ambulatory  Blood pressure 152/87, pulse 107, temperature 97.8 F (36.6 C),  temperature source Oral, resp. rate 19, height 5\' 8"  (1.727 m), weight 298 lb 12.8 oz (135.535 kg), SpO2 100.00%.  LABORATORY DATA: Lab Results  Component Value Date   WBC 4.5 06/20/2013   HGB 10.2* 06/20/2013   HCT 31.9* 06/20/2013   MCV 85.5 06/20/2013   PLT 214 06/20/2013      Chemistry        Component Value Date/Time   NA 143 06/20/2013 1351   NA 138 02/25/2008 0548   K 3.3* 06/20/2013 1351   K 4.2 02/25/2008 0548   CL 102 02/25/2008 0548   CO2 24 06/20/2013 1351   CO2 29 02/25/2008 0548   BUN 31.6* 06/20/2013 1351   BUN 21 02/25/2008 0548   CREATININE 2.8* 06/20/2013 1351   CREATININE 1.32* 02/25/2008 0548      Component Value Date/Time   CALCIUM 9.4 06/20/2013 1351   CALCIUM 9.3 02/25/2008 0548   ALKPHOS 84 06/20/2013 1351   ALKPHOS 97 07/04/2007 2038   AST 26 06/20/2013 1351   AST 21 07/04/2007 2038   ALT 15 06/20/2013 1351   ALT 18 07/04/2007 2038   BILITOT 0.30 06/20/2013 1351   BILITOT 0.3 07/04/2007 2038       RADIOGRAPHIC STUDIES: ONE MARROW REPORT FINAL DIAGNOSIS ASSESSMENT AND PLAN: This is a very pleasant 58 years old African American female recently diagnosed with monoclonal gammopathy of undetermined significance.  Her recent blood work showed no evidence for disease progression but the serum light chain is still pending. I discussed the lab result with the patient today and recommended for her to continue on observation with repeat myeloma panel in 6 months.  She was advised to call immediately if she has any concerning symptoms in the interval.  The patient voices understanding of current disease status and treatment options and is in agreement with the current care plan.  All questions were answered. The patient knows to call the clinic with any problems, questions or concerns. We can certainly see the patient much sooner if necessary.  Disclaimer: This note was dictated with voice recognition software. Similar sounding words can inadvertently be transcribed and may not be corrected upon review.

## 2013-06-23 NOTE — Telephone Encounter (Signed)
gv and printed appt sched and avs for pt for SEpt and OCT

## 2013-06-25 LAB — BETA 2 MICROGLOBULIN, SERUM: Beta-2 Microglobulin: 11.5 mg/L — ABNORMAL HIGH (ref <=2.51)

## 2013-06-25 LAB — IGG, IGA, IGM
IGA: 399 mg/dL — AB (ref 69–380)
IGG (IMMUNOGLOBIN G), SERUM: 1170 mg/dL (ref 690–1700)
IgM, Serum: 99 mg/dL (ref 52–322)

## 2013-06-25 LAB — KAPPA/LAMBDA LIGHT CHAINS
Kappa free light chain: 15.4 mg/dL — ABNORMAL HIGH (ref 0.33–1.94)
Kappa:Lambda Ratio: 2.41 — ABNORMAL HIGH (ref 0.26–1.65)
LAMBDA FREE LGHT CHN: 6.38 mg/dL — AB (ref 0.57–2.63)

## 2013-12-16 ENCOUNTER — Other Ambulatory Visit: Payer: 59

## 2013-12-17 ENCOUNTER — Telehealth: Payer: Self-pay | Admitting: Medical Oncology

## 2013-12-17 ENCOUNTER — Other Ambulatory Visit (HOSPITAL_BASED_OUTPATIENT_CLINIC_OR_DEPARTMENT_OTHER): Payer: 59

## 2013-12-17 ENCOUNTER — Other Ambulatory Visit: Payer: Self-pay | Admitting: Medical Oncology

## 2013-12-17 DIAGNOSIS — D472 Monoclonal gammopathy: Secondary | ICD-10-CM

## 2013-12-17 DIAGNOSIS — D890 Polyclonal hypergammaglobulinemia: Principal | ICD-10-CM

## 2013-12-17 LAB — COMPREHENSIVE METABOLIC PANEL (CC13)
ALBUMIN: 2.5 g/dL — AB (ref 3.5–5.0)
ALK PHOS: 106 U/L (ref 40–150)
ALT: 16 U/L (ref 0–55)
AST: 27 U/L (ref 5–34)
Anion Gap: 7 mEq/L (ref 3–11)
BUN: 28.1 mg/dL — ABNORMAL HIGH (ref 7.0–26.0)
CO2: 24 mEq/L (ref 22–29)
Calcium: 9 mg/dL (ref 8.4–10.4)
Chloride: 112 mEq/L — ABNORMAL HIGH (ref 98–109)
Creatinine: 2.8 mg/dL — ABNORMAL HIGH (ref 0.6–1.1)
Glucose: 140 mg/dl (ref 70–140)
POTASSIUM: 3.9 meq/L (ref 3.5–5.1)
Sodium: 142 mEq/L (ref 136–145)
Total Bilirubin: 0.25 mg/dL (ref 0.20–1.20)
Total Protein: 6.5 g/dL (ref 6.4–8.3)

## 2013-12-17 LAB — CBC WITH DIFFERENTIAL/PLATELET
BASO%: 0.5 % (ref 0.0–2.0)
Basophils Absolute: 0 10*3/uL (ref 0.0–0.1)
EOS%: 3 % (ref 0.0–7.0)
Eosinophils Absolute: 0.2 10*3/uL (ref 0.0–0.5)
HCT: 30.6 % — ABNORMAL LOW (ref 34.8–46.6)
HGB: 9.6 g/dL — ABNORMAL LOW (ref 11.6–15.9)
LYMPH%: 23.3 % (ref 14.0–49.7)
MCH: 27.2 pg (ref 25.1–34.0)
MCHC: 31.5 g/dL (ref 31.5–36.0)
MCV: 86.3 fL (ref 79.5–101.0)
MONO#: 0.4 10*3/uL (ref 0.1–0.9)
MONO%: 6.9 % (ref 0.0–14.0)
NEUT#: 3.5 10*3/uL (ref 1.5–6.5)
NEUT%: 66.3 % (ref 38.4–76.8)
Platelets: 223 10*3/uL (ref 145–400)
RBC: 3.55 10*6/uL — ABNORMAL LOW (ref 3.70–5.45)
RDW: 14.6 % — AB (ref 11.2–14.5)
WBC: 5.2 10*3/uL (ref 3.9–10.3)
lymph#: 1.2 10*3/uL (ref 0.9–3.3)

## 2013-12-17 LAB — LACTATE DEHYDROGENASE (CC13): LDH: 251 U/L — ABNORMAL HIGH (ref 125–245)

## 2013-12-17 NOTE — Telephone Encounter (Signed)
Lab appt given

## 2013-12-19 LAB — BETA 2 MICROGLOBULIN, SERUM: Beta-2 Microglobulin: 10.2 mg/L — ABNORMAL HIGH (ref <=2.51)

## 2013-12-19 LAB — IGG, IGA, IGM
IGG (IMMUNOGLOBIN G), SERUM: 972 mg/dL (ref 690–1700)
IgA: 380 mg/dL (ref 69–380)
IgM, Serum: 91 mg/dL (ref 52–322)

## 2013-12-19 LAB — KAPPA/LAMBDA LIGHT CHAINS
KAPPA LAMBDA RATIO: 3.36 — AB (ref 0.26–1.65)
Kappa free light chain: 18.9 mg/dL — ABNORMAL HIGH (ref 0.33–1.94)
LAMBDA FREE LGHT CHN: 5.63 mg/dL — AB (ref 0.57–2.63)

## 2013-12-23 ENCOUNTER — Telehealth: Payer: Self-pay | Admitting: Internal Medicine

## 2013-12-23 ENCOUNTER — Encounter: Payer: Self-pay | Admitting: Internal Medicine

## 2013-12-23 ENCOUNTER — Ambulatory Visit (HOSPITAL_BASED_OUTPATIENT_CLINIC_OR_DEPARTMENT_OTHER): Payer: 59 | Admitting: Internal Medicine

## 2013-12-23 VITALS — BP 168/93 | HR 84 | Temp 98.3°F | Resp 18 | Ht 68.0 in | Wt 301.0 lb

## 2013-12-23 DIAGNOSIS — D890 Polyclonal hypergammaglobulinemia: Principal | ICD-10-CM

## 2013-12-23 DIAGNOSIS — D472 Monoclonal gammopathy: Secondary | ICD-10-CM

## 2013-12-23 NOTE — Progress Notes (Signed)
[place] [phone]   Fax:(336) [facility] [employer], [address] [place] [address]  DIAGNOSIS: Monoclonal gammopathy of undetermined significance  PRIOR THERAPY: None  CURRENT THERAPY: Observation.  INTERVAL HISTORY: Kristen English 58 y.o. female returns to the clinic today for three-month followup visit. The patient is feeling fine today with no specific complaints. She denied having any significant weight loss or night sweats. She has no nausea or vomiting. The patient denied having any aches and pains. She has no chest pain, shortness of breath, cough or hemoptysis. The previous 24-hour urine protein electrophoresis shows no detectable monoclonal gammopathy. Her serum creatinine is stable but there was significant worsening compared to 1 year ago. She is followed by Dr. Moshe Cipro for her renal dysfunction. She had repeat myeloma panel performed recently and she is here for evaluation and discussion of her lab results.  MEDICAL HISTORY: Past Medical History  Diagnosis Date  . Hypertension   . Diabetes mellitus without complication   . Gout   . Chronic kidney disease     protein in urine  . Arthritis     ALLERGIES:  is allergic to morphine and related.  MEDICATIONS:  Current Outpatient Prescriptions  Medication Sig Dispense Refill  . amLODipine (NORVASC) 10 MG tablet Take 10 mg by mouth at bedtime.       Marland Kitchen aspirin 81 MG tablet Take 81 mg by mouth daily.      . carvedilol (COREG) 25 MG tablet Take 25 mg by mouth daily.      . cholecalciferol (VITAMIN D) 1000 UNITS tablet Take 2,000 Units by mouth daily.      . colchicine 0.6 MG tablet Take 0.6 mg by mouth every 12 (twelve) hours as needed.      Marland Kitchen glipiZIDE (GLUCOTROL) 5 MG tablet Take 5 mg by mouth 3 (three) times daily with meals.      . hydrochlorothiazide (HYDRODIURIL) 25 MG tablet Take 25 mg by mouth daily.      . hydrOXYzine  (ATARAX/VISTARIL) 25 MG tablet Take 25 mg by mouth at bedtime as needed.      . Liraglutide (VICTOZA) 18 MG/3ML SOPN Inject 1.8 mg into the skin daily.       Marland Kitchen lovastatin (MEVACOR) 10 MG tablet Take 10 mg by mouth.       No current facility-administered medications for this visit.    SURGICAL HISTORY:  Past Surgical History  Procedure Laterality Date  . Foot surgery Left 2007  . Joint replacement  2009    bilateral knees  . Total knee arthroplasty      bilateral    REVIEW OF SYSTEMS:  Constitutional: negative Eyes: negative Ears, nose, mouth, throat, and face: negative Respiratory: negative Cardiovascular: negative Gastrointestinal: negative Genitourinary:negative Integument/breast: negative Hematologic/lymphatic: negative Musculoskeletal:negative Neurological: negative Behavioral/Psych: negative Endocrine: negative Allergic/Immunologic: negative   PHYSICAL EXAMINATION: General appearance: alert, cooperative and no distress Head: Normocephalic, without obvious abnormality, atraumatic Neck: no adenopathy, no JVD, supple, symmetrical, trachea midline and thyroid not enlarged, symmetric, no tenderness/mass/nodules Lymph nodes: Cervical, supraclavicular, and axillary nodes normal. Resp: clear to auscultation bilaterally Back: symmetric, no curvature. ROM normal. No CVA tenderness. Cardio: regular rate and rhythm, S1, S2 normal, no murmur, click, rub or gallop GI: soft, non-tender; bowel sounds normal; no masses,  no organomegaly Extremities: extremities normal, atraumatic, no cyanosis or edema Neurologic: Alert and oriented X 3, normal strength and tone. Normal symmetric reflexes.  Normal coordination and gait  ECOG PERFORMANCE STATUS: 1 - Symptomatic but completely ambulatory  There were no vitals taken for this visit.  LABORATORY DATA: Lab Results  Component Value Date   WBC 5.2 12/17/2013   HGB 9.6* 12/17/2013   HCT 30.6* 12/17/2013   MCV 86.3 12/17/2013   PLT 223  12/17/2013      Chemistry      Component Value Date/Time   NA 142 12/17/2013 1432   NA 138 02/25/2008 0548   K 3.9 12/17/2013 1432   K 4.2 02/25/2008 0548   CL 102 02/25/2008 0548   CO2 24 12/17/2013 1432   CO2 29 02/25/2008 0548   BUN 28.1* 12/17/2013 1432   BUN 21 02/25/2008 0548   CREATININE 2.8* 12/17/2013 1432   CREATININE 1.32* 02/25/2008 0548      Component Value Date/Time   CALCIUM 9.0 12/17/2013 1432   CALCIUM 9.3 02/25/2008 0548   ALKPHOS 106 12/17/2013 1432   ALKPHOS 97 07/04/2007 2038   AST 27 12/17/2013 1432   AST 21 07/04/2007 2038   ALT 16 12/17/2013 1432   ALT 18 07/04/2007 2038   BILITOT 0.25 12/17/2013 1432   BILITOT 0.3 07/04/2007 2038       RADIOGRAPHIC STUDIES: RROWEPORT FINAL DIAGNOSIS ASSESSMENT AND PLAN: This is a very pleasant 58 years old African American female recently diagnosed with monoclonal gammopathy of undetermined significance.  Her recent blood work showed no evidence for disease progression but the patient continues to have an elevated free kappa light chain. I discussed the lab result with the patient today and recommended for her to consider repeating bone marrow biopsy and aspirate to rule out any conversion to multiple myeloma especially with her worsening renal function.  She would come back for followup visit in one month's for reevaluation and discussion of her biopsy results. She was advised to call immediately if she has any concerning symptoms in the interval.  The patient voices understanding of current disease status and treatment options and is in agreement with the current care plan.  All questions were answered. The patient knows to call the clinic with any problems, questions or concerns. We can certainly see the patient much sooner if necessary.  Disclaimer: This note was dictated with voice recognition software. Similar sounding words can inadvertently be transcribed and may not be corrected upon review.

## 2013-12-23 NOTE — Telephone Encounter (Signed)
, °

## 2013-12-29 ENCOUNTER — Other Ambulatory Visit: Payer: Self-pay | Admitting: Radiology

## 2013-12-30 ENCOUNTER — Encounter (HOSPITAL_COMMUNITY): Payer: Self-pay | Admitting: Pharmacy Technician

## 2013-12-30 ENCOUNTER — Other Ambulatory Visit: Payer: Self-pay | Admitting: Radiology

## 2013-12-31 ENCOUNTER — Ambulatory Visit (HOSPITAL_COMMUNITY)
Admission: RE | Admit: 2013-12-31 | Discharge: 2013-12-31 | Disposition: A | Discharge status: Home / Self Care | Payer: Medicare PPO | Source: Ambulatory Visit | Attending: Internal Medicine | Admitting: Internal Medicine

## 2013-12-31 ENCOUNTER — Encounter (HOSPITAL_COMMUNITY): Payer: Self-pay

## 2013-12-31 DIAGNOSIS — D890 Polyclonal hypergammaglobulinemia: Secondary | ICD-10-CM

## 2013-12-31 DIAGNOSIS — D472 Monoclonal gammopathy: Secondary | ICD-10-CM

## 2013-12-31 LAB — GLUCOSE, CAPILLARY
GLUCOSE-CAPILLARY: 122 mg/dL — AB (ref 70–99)
Glucose-Capillary: 116 mg/dL — ABNORMAL HIGH (ref 70–99)

## 2013-12-31 LAB — CBC
HCT: 33.5 % — ABNORMAL LOW (ref 36.0–46.0)
Hemoglobin: 10.5 g/dL — ABNORMAL LOW (ref 12.0–15.0)
MCH: 27.5 pg (ref 26.0–34.0)
MCHC: 31.3 g/dL (ref 30.0–36.0)
MCV: 87.7 fL (ref 78.0–100.0)
Platelets: 221 10*3/uL (ref 150–400)
RBC: 3.82 MIL/uL — AB (ref 3.87–5.11)
RDW: 14.2 % (ref 11.5–15.5)
WBC: 4.7 10*3/uL (ref 4.0–10.5)

## 2013-12-31 LAB — PROTIME-INR
INR: 1.06 (ref 0.00–1.49)
PROTHROMBIN TIME: 13.9 s (ref 11.6–15.2)

## 2013-12-31 LAB — BONE MARROW EXAM

## 2013-12-31 MED ORDER — MIDAZOLAM HCL 2 MG/2ML IJ SOLN
INTRAMUSCULAR | Status: AC | PRN
  Administered 2013-12-31: 1 mg via INTRAVENOUS
  Administered 2013-12-31: 2 mg via INTRAVENOUS

## 2013-12-31 MED ORDER — FENTANYL CITRATE 0.05 MG/ML IJ SOLN
INTRAMUSCULAR | Status: AC | PRN
  Administered 2013-12-31: 100 ug via INTRAVENOUS
  Administered 2013-12-31: 50 ug via INTRAVENOUS

## 2013-12-31 MED ORDER — SODIUM CHLORIDE 0.9 % IV SOLN
INTRAVENOUS | Status: DC
  Administered 2013-12-31: 08:00:00 via INTRAVENOUS

## 2013-12-31 MED ORDER — MIDAZOLAM HCL 2 MG/2ML IJ SOLN
INTRAMUSCULAR | Status: AC
  Filled 2013-12-31: qty 4

## 2013-12-31 MED ORDER — FENTANYL CITRATE 0.05 MG/ML IJ SOLN
INTRAMUSCULAR | Status: AC
  Filled 2013-12-31: qty 4

## 2013-12-31 NOTE — Discharge Instructions (Signed)
Bone Marrow Aspiration, Bone Marrow Biopsy °Care After °Read the instructions outlined below and refer to this sheet in the next few weeks. These discharge instructions provide you with general information on caring for yourself after you leave the hospital. Your caregiver may also give you specific instructions. While your treatment has been planned according to the most current medical practices available, unavoidable complications occasionally occur. If you have any problems or questions after discharge, call your caregiver. °FINDING OUT THE RESULTS OF YOUR TEST °Not all test results are available during your visit. If your test results are not back during the visit, make an appointment with your caregiver to find out the results. Do not assume everything is normal if you have not heard from your caregiver or the medical facility. It is important for you to follow up on all of your test results.  °HOME CARE INSTRUCTIONS  °You have had sedation and may be sleepy or dizzy. Your thinking may not be as clear as usual. For the next 24 hours: °· Only take over-the-counter or prescription medicines for pain, discomfort, and or fever as directed by your caregiver. °· Do not drink alcohol. °· Do not smoke. °· Do not drive. °· Do not make important legal decisions. °· Do not operate heavy machinery. °· Do not care for small children by yourself. °· Keep your dressing clean and dry. You may replace dressing with a bandage after 24 hours. °· You may take a bath or shower after 24 hours. °· Use an ice pack for 20 minutes every 2 hours while awake for pain as needed. °SEEK MEDICAL CARE IF:  °· There is redness, swelling, or increasing pain at the biopsy site. °· There is pus coming from the biopsy site. °· There is drainage from a biopsy site lasting longer than one day. °· An unexplained oral temperature above 102° F (38.9° C) develops. °SEEK IMMEDIATE MEDICAL CARE IF:  °· You develop a rash. °· You have difficulty  breathing. °· You develop any reaction or side effects to medications given. °Document Released: 09/23/2004 Document Revised: 05/29/2011 Document Reviewed: 03/03/2008 °[employer]® Patient Information ©2015 [employer]. This information is not intended to replace advice given to you by your health care provider. Make sure you discuss any questions you have with your health care provider. °Conscious Sedation, Adult, Care After °Refer to this sheet in the next few weeks. These instructions provide you with information on caring for yourself after your procedure. Your health care provider may also give you more specific instructions. Your treatment has been planned according to current medical practices, but problems sometimes occur. Call your health care provider if you have any problems or questions after your procedure. °WHAT TO EXPECT AFTER THE PROCEDURE  °After your procedure: °· You may feel sleepy, clumsy, and have poor balance for several hours. °· Vomiting may occur if you eat too soon after the procedure. °HOME CARE INSTRUCTIONS °· Do not participate in any activities where you could become injured for at least 24 hours. Do not: °¨ Drive. °¨ Swim. °¨ Ride a bicycle. °¨ Operate heavy machinery. °¨ Cook. °¨ Use power tools. °¨ Climb ladders. °¨ Work from a high place. °· Do not make important decisions or sign legal documents until you are improved. °· If you vomit, drink water, juice, or soup when you can drink without vomiting. Make sure you have little or no nausea before eating solid foods. °· Only take over-the-counter or prescription medicines for pain, discomfort, or fever   as directed by your health care provider. °· Make sure you and your family fully understand everything about the medicines given to you, including what side effects may occur. °· You should not drink alcohol, take sleeping pills, or take medicines that cause drowsiness for at least 24 hours. °· If you smoke, do not smoke without  supervision. °· If you are feeling better, you may resume normal activities 24 hours after you were sedated. °· Keep all appointments with your health care provider. °SEEK MEDICAL CARE IF: °· Your skin is pale or bluish in color. °· You continue to feel nauseous or vomit. °· Your pain is getting worse and is not helped by medicine. °· You have bleeding or swelling. °· You are still sleepy or feeling clumsy after 24 hours. °SEEK IMMEDIATE MEDICAL CARE IF: °· You develop a rash. °· You have difficulty breathing. °· You develop any type of allergic problem. °· You have a fever. °MAKE SURE YOU: °· Understand these instructions. °· Will watch your condition. °· Will get help right away if you are not doing well or get worse. °Document Released: 12/25/2012 Document Reviewed: 12/25/2012 °[employer]® Patient Information ©2015 [employer]. This information is not intended to replace advice given to you by your health care provider. Make sure you discuss any questions you have with your health care provider. ° °

## 2013-12-31 NOTE — H&P (Signed)
Chief Complaint: "I am here for a bone marrow biopsy."  Referring Physician(s): Mohamed,Mohamed  History of Present Illness: Kristen English is a 58 y.o. female with elevated free light chains, monoclonal gammopathy of undetermined significance. Request for image guided bone marrow biopsy with moderate sedation. She denies any bone pain today, she denies any chest pain, shortness of breath or palpitations. she denies any active signs of bleeding or excessive bruising. she denies any recent fever or chills. The patient denies any history of sleep apnea or chronic oxygen use. she has previously tolerated sedation without complications.    Past Medical History  Diagnosis Date  . Hypertension   . Diabetes mellitus without complication   . Gout   . Chronic kidney disease     protein in urine  . Arthritis     Past Surgical History  Procedure Laterality Date  . Foot surgery Left 2007  . Joint replacement  2009    bilateral knees  . Total knee arthroplasty      bilateral    Allergies: Morphine and related  Medications: Prior to Admission medications   Medication Sig Start Date End Date Taking? Authorizing Provider  amLODipine (NORVASC) 10 MG tablet Take 10 mg by mouth at bedtime.    Yes Historical Provider, MD  aspirin 81 MG tablet Take 81 mg by mouth at bedtime.    Yes Historical Provider, MD  carvedilol (COREG) 25 MG tablet Take 25 mg by mouth every morning.  02/24/13  Yes Historical Provider, MD  cholecalciferol (VITAMIN D) 1000 UNITS tablet Take 2,000 Units by mouth every morning.    Yes Historical Provider, MD  furosemide (LASIX) 40 MG tablet Take 40 mg by mouth 2 (two) times daily.   Yes Historical Provider, MD  glipiZIDE (GLUCOTROL) 5 MG tablet Take 5 mg by mouth 3 (three) times daily with meals.   Yes Historical Provider, MD  hydrOXYzine (ATARAX/VISTARIL) 25 MG tablet Take 25 mg by mouth at bedtime as needed for itching.    Yes Historical Provider, MD  ibuprofen  (ADVIL,MOTRIN) 200 MG tablet Take 400-600 mg by mouth every 8 (eight) hours as needed for mild pain.   Yes Historical Provider, MD  Liraglutide (VICTOZA) 18 MG/3ML SOPN Inject 1.8 mg into the skin daily at 12 noon.    Yes Historical Provider, MD  lovastatin (MEVACOR) 10 MG tablet Take 10 mg by mouth at bedtime.  03/19/13  Yes Historical Provider, MD  colchicine 0.6 MG tablet Take 0.6 mg by mouth every 12 (twelve) hours as needed (gout.).     Historical Provider, MD    History reviewed. No pertinent family history.  History   Social History  . Marital Status: Divorced    Spouse Name: N/A    Number of Children: N/A  . Years of Education: N/A   Social History Main Topics  . Smoking status: Never Smoker   . Smokeless tobacco: Never Used  . Alcohol Use: No  . Drug Use: No  . Sexual Activity: None   Other Topics Concern  . None   Social History Narrative  . None    Review of Systems: A 12 point ROS discussed and pertinent positives are indicated in the HPI above.  All other systems are negative.  Review of Systems  Vital Signs: BP 144/83  Pulse 91  Temp(Src) 98.2 F (36.8 C) (Oral)  Resp 16  SpO2 98%  Physical Exam  Constitutional: She is oriented to person, place, and time. She appears  well-developed and well-nourished. No distress.  HENT:  Head: Normocephalic and atraumatic.  Neck: No tracheal deviation present.  Cardiovascular: Normal rate and regular rhythm.  Exam reveals no gallop and no friction rub.   No murmur heard. Pulmonary/Chest: Effort normal and breath sounds normal. No respiratory distress. She has no wheezes. She has no rales.  Abdominal: Soft. Bowel sounds are normal. She exhibits no distension. There is no tenderness.  Neurological: She is alert and oriented to person, place, and time.  Skin: Skin is warm and dry. She is not diaphoretic.  Psychiatric: She has a normal mood and affect. Her behavior is normal. Thought content normal.    Imaging: No  results found.  Labs:  CBC:  Recent Labs  03/19/13 1303 06/20/13 1350 12/17/13 1432 12/31/13 0715  WBC 4.3 4.5 5.2 4.7  HGB 10.8* 10.2* 9.6* 10.5*  HCT 33.7* 31.9* 30.6* 33.5*  PLT 214 214 223 221    COAGS:  Recent Labs  12/31/13 0715  INR 1.06    BMP:  Recent Labs  03/19/13 1304 06/20/13 1351 12/17/13 1432  NA 143 143 142  K 3.7 3.3* 3.9  CO2 '23 24 24  ' GLUCOSE 114 113 140  BUN 29.7* 31.6* 28.1*  CALCIUM 9.1 9.4 9.0  CREATININE 3.0* 2.8* 2.8*    LIVER FUNCTION TESTS:  Recent Labs  03/19/13 1304 06/20/13 1351 12/17/13 1432  BILITOT 0.23 0.30 0.25  AST '27 26 27  ' ALT '20 15 16  ' ALKPHOS 104 84 106  PROT 6.9 6.8 6.5  ALBUMIN 2.5* 2.6* 2.5*   Assessment and Plan: Elevated free light chains Monoclonal gammopathy of undetermined significance Request for image guided bone marrow biopsy with moderate sedation Patient has been NPO, labs reviewed Risks and Benefits discussed with the patient. All of the patient's questions were answered, patient is agreeable to proceed. Consent signed and in chart.     SignedHedy Jacob 12/31/2013, 8:29 AM

## 2013-12-31 NOTE — Procedures (Signed)
Interventional Radiology Procedure Note  Procedure: CT guided aspirate and core biopsy of right iliac bone Complications: None Recommendations: - Bedrest supine x 2 hrs - Hydrocodone PRN  Pain - Follow biopsy results  Signed,  Kiarrah Rausch K. Jacie Tristan, MD   

## 2014-01-07 LAB — TISSUE HYBRIDIZATION (BONE MARROW)-NCBH

## 2014-01-07 LAB — CHROMOSOME ANALYSIS, BONE MARROW

## 2014-01-20 ENCOUNTER — Encounter (HOSPITAL_COMMUNITY): Payer: Self-pay

## 2014-01-21 ENCOUNTER — Telehealth: Payer: Self-pay | Admitting: Internal Medicine

## 2014-01-21 ENCOUNTER — Ambulatory Visit (HOSPITAL_BASED_OUTPATIENT_CLINIC_OR_DEPARTMENT_OTHER): Payer: 59 | Admitting: Internal Medicine

## 2014-01-21 ENCOUNTER — Encounter: Payer: Self-pay | Admitting: Internal Medicine

## 2014-01-21 VITALS — BP 147/81 | HR 93 | Temp 98.3°F | Resp 19 | Ht 68.0 in | Wt 301.3 lb

## 2014-01-21 DIAGNOSIS — D890 Polyclonal hypergammaglobulinemia: Principal | ICD-10-CM

## 2014-01-21 DIAGNOSIS — D472 Monoclonal gammopathy: Secondary | ICD-10-CM

## 2014-01-21 NOTE — Progress Notes (Signed)
[facility] [phone]   Fax:([phone]) [employer], [address] [place] [address]  DIAGNOSIS: Monoclonal gammopathy of undetermined significance  PRIOR THERAPY: None  CURRENT THERAPY: Observation.  INTERVAL HISTORY: Kristen English 58 y.o. female returns to the clinic today for three-month followup visit. The patient is feeling fine today with no specific complaints. She denied having any significant weight loss or night sweats. She has no nausea or vomiting. The patient denied having any aches and pains. She has no chest pain, shortness of breath, cough or hemoptysis.  She is followed by Dr. Moshe Cipro for her renal dysfunction. She had repeat bone marrow biopsy and aspirate recently and she is here for evaluation and discussion of her biopsy results.  MEDICAL HISTORY: Past Medical History  Diagnosis Date  . Hypertension   . Diabetes mellitus without complication   . Gout   . Chronic kidney disease     protein in urine  . Arthritis     ALLERGIES:  is allergic to morphine and related.  MEDICATIONS:  Current Outpatient Prescriptions  Medication Sig Dispense Refill  . amLODipine (NORVASC) 10 MG tablet Take 10 mg by mouth at bedtime.     Marland Kitchen aspirin 81 MG tablet Take 81 mg by mouth at bedtime.     . carvedilol (COREG) 25 MG tablet Take 25 mg by mouth every morning.     . cholecalciferol (VITAMIN D) 1000 UNITS tablet Take 2,000 Units by mouth every morning.     . furosemide (LASIX) 40 MG tablet Take 40 mg by mouth 2 (two) times daily.    Marland Kitchen glipiZIDE (GLUCOTROL) 5 MG tablet Take 5 mg by mouth 3 (three) times daily with meals.    . hydrOXYzine (ATARAX/VISTARIL) 25 MG tablet Take 25 mg by mouth at bedtime as needed for itching.     . Liraglutide (VICTOZA) 18 MG/3ML SOPN Inject 1.8 mg into the skin daily at 12 noon.     . lovastatin (MEVACOR) 10 MG tablet Take 10 mg by mouth at bedtime.       . colchicine 0.6 MG tablet Take 0.6 mg by mouth every 12 (twelve) hours as needed (gout.).      No current facility-administered medications for this visit.    SURGICAL HISTORY:  Past Surgical History  Procedure Laterality Date  . Foot surgery Left 2007  . Joint replacement  2009    bilateral knees  . Total knee arthroplasty      bilateral    REVIEW OF SYSTEMS:  Constitutional: negative Eyes: negative Ears, nose, mouth, throat, and face: negative Respiratory: negative Cardiovascular: negative Gastrointestinal: negative Genitourinary:negative Integument/breast: negative Hematologic/lymphatic: negative Musculoskeletal:negative Neurological: negative Behavioral/Psych: negative Endocrine: negative Allergic/Immunologic: negative   PHYSICAL EXAMINATION: General appearance: alert, cooperative and no distress Head: Normocephalic, without obvious abnormality, atraumatic Neck: no adenopathy, no JVD, supple, symmetrical, trachea midline and thyroid not enlarged, symmetric, no tenderness/mass/nodules Lymph nodes: Cervical, supraclavicular, and axillary nodes normal. Resp: clear to auscultation bilaterally Back: symmetric, no curvature. ROM normal. No CVA tenderness. Cardio: regular rate and rhythm, S1, S2 normal, no murmur, click, rub or gallop GI: soft, non-tender; bowel sounds normal; no masses,  no organomegaly Extremities: extremities normal, atraumatic, no cyanosis or edema Neurologic: Alert and oriented X 3, normal strength and tone. Normal symmetric reflexes. Normal coordination and gait  ECOG PERFORMANCE STATUS: 1 - Symptomatic but completely ambulatory  Blood pressure 147/81, pulse 93, temperature 98.3  F (36.8 C), temperature source Oral, resp. rate 19, height $RemoveBe'5\' 8"'fdeotAdJf$  (1.727 m), weight 301 lb 4.8 oz (136.669 kg), SpO2 100 %.  LABORATORY DATA: Lab Results  Component Value Date   WBC 4.7 12/31/2013   HGB 10.5* 12/31/2013   HCT 33.5* 12/31/2013   MCV 87.7 12/31/2013    PLT 221 12/31/2013      Chemistry      Component Value Date/Time   NA 142 12/17/2013 1432   NA 138 02/25/2008 0548   K 3.9 12/17/2013 1432   K 4.2 02/25/2008 0548   CL 102 02/25/2008 0548   CO2 24 12/17/2013 1432   CO2 29 02/25/2008 0548   BUN 28.1* 12/17/2013 1432   BUN 21 02/25/2008 0548   CREATININE 2.8* 12/17/2013 1432   CREATININE 1.32* 02/25/2008 0548      Component Value Date/Time   CALCIUM 9.0 12/17/2013 1432   CALCIUM 9.3 02/25/2008 0548   ALKPHOS 106 12/17/2013 1432   ALKPHOS 97 07/04/2007 2038   AST 27 12/17/2013 1432   AST 21 07/04/2007 2038   ALT 16 12/17/2013 1432   ALT 18 07/04/2007 2038   BILITOT 0.25 12/17/2013 1432   BILITOT 0.3 07/04/2007 2038       RADIOGRAPHIC STUDIES: Ct Biopsy  12/31/2013   CLINICAL DATA:  58 year old female with a monoclonal gammopathy of undetermined significance.  EXAM: CT BIOPSY  Date: 12/31/2013  PROCEDURE: 1. CT guided bone marrow aspiration and core biopsy Interventional Radiologist:  Criselda Peaches, MD  ANESTHESIA/SEDATION: Moderate (conscious) sedation was used. Three mg Versed, 150 mcg Fentanyl were administered intravenously. The patient's vital signs were monitored continuously by radiology nursing throughout the procedure.  Sedation Time: 15 minutes  TECHNIQUE: Informed consent was obtained from the patient following explanation of the procedure, risks, benefits and alternatives. The patient understands, agrees and consents for the procedure. All questions were addressed. A time out was performed.  The patient was positioned prone and noncontrast localization CT was performed of the pelvis to demonstrate the iliac marrow spaces.  Maximal barrier sterile technique utilized including caps, mask, sterile gowns, sterile gloves, large sterile drape, hand hygiene, and betadine prep.  Under sterile conditions and local anesthesia, an 11 gauge coaxial bone biopsy needle was advanced into the right iliac marrow space. Needle  position was confirmed with CT imaging. Initially, bone marrow aspiration was performed. Next, the 11 gauge outer cannula was utilized to obtain a right iliac bone marrow core biopsy. Needle was removed. Hemostasis was obtained with compression. The patient tolerated the procedure well. Samples were prepared with the cytotechnologist. No immediate complications.  IMPRESSION: CT guided right iliac bone marrow aspiration and core biopsy.  Signed,  Criselda Peaches, MD  [facility]  [facility]   Electronically Signed   By: Jacqulynn Cadet M.D.   On: 12/31/2013 12:25   R BONE MARROW REPORT FINAL DIAGNOSIS Diagnosis Bone Marrow, Aspirate,Biopsy, and Clot, right iliac - NORMOCELLULAR BONE MARROW FOR AGE WITH TRILINEAGE HEMATOPOIESIS. - PLASMACYTOSIS (PLASMA CELLS 9%) - SEE COMMENT. PERIPHERAL BLOOD: - NORMOCYTIC-NORMOCHROMIC ANEMIA. Diagnosis Note The bone marrow is generally normocellular for age with trilineage myeloid hematopoiesis and non specific changes. The plasma cells are increased in number representing 9% of all cells with lack of large aggregates or sheets. Immunohistochemical stains show prominent background staining with kappa and lambda light chains and hence they are considered noncontributory. Correlation with cytogenetic and FISH studies is recommended. (BNS:gt:ecj, 01/01/14) Susanne Greenhouse MD Pathologist, Electronic Signature (Case  signed 10OWEPORT FINAL DIAGNOSIS ASSESSMENT AND PLAN: This is a very pleasant 57 years old [place] American female recently diagnosed with monoclonal gammopathy of undetermined significance.  Her recent blood work showed no evidence for disease progression but the patient continues to have an elevated free kappa light chain. She underwent a bone marrow biopsy and aspirate that showed 9% of plasma cells but were nonspecific with prominent background staining for both kappa and lambda light chains. I  discussed the biopsy results with the patient today. I recommended for her to continue on observation with repeat myeloma panel in 3 months. Further insufficiency, she will continue her routine follow-up visit and evaluation by Dr. Moshe Cipro. She was advised to call immediately if she has any concerning symptoms in the interval.  The patient voices understanding of current disease status and treatment options and is in agreement with the current care plan.  All questions were answered. The patient knows to call the clinic with any problems, questions or concerns. We can certainly see the patient much sooner if necessary.  Disclaimer: This note was dictated with voice recognition software. Similar sounding words can inadvertently be transcribed and may not be corrected upon review.

## 2014-01-21 NOTE — Telephone Encounter (Signed)
gv adn printed appt sched and avs for pt for Feb 2016 °

## 2014-03-16 ENCOUNTER — Other Ambulatory Visit: Payer: Self-pay | Admitting: Gastroenterology

## 2014-03-23 DIAGNOSIS — N183 Chronic kidney disease, stage 3 (moderate): Secondary | ICD-10-CM | POA: Diagnosis not present

## 2014-03-23 DIAGNOSIS — E785 Hyperlipidemia, unspecified: Secondary | ICD-10-CM | POA: Diagnosis not present

## 2014-03-23 DIAGNOSIS — E119 Type 2 diabetes mellitus without complications: Secondary | ICD-10-CM | POA: Diagnosis not present

## 2014-03-23 DIAGNOSIS — I10 Essential (primary) hypertension: Secondary | ICD-10-CM | POA: Diagnosis not present

## 2014-03-25 DIAGNOSIS — Z1231 Encounter for screening mammogram for malignant neoplasm of breast: Secondary | ICD-10-CM | POA: Diagnosis not present

## 2014-04-24 ENCOUNTER — Other Ambulatory Visit (HOSPITAL_BASED_OUTPATIENT_CLINIC_OR_DEPARTMENT_OTHER): Payer: Commercial Managed Care - HMO

## 2014-04-24 DIAGNOSIS — D890 Polyclonal hypergammaglobulinemia: Principal | ICD-10-CM

## 2014-04-24 DIAGNOSIS — D472 Monoclonal gammopathy: Secondary | ICD-10-CM | POA: Diagnosis not present

## 2014-04-24 LAB — CBC WITH DIFFERENTIAL/PLATELET
BASO%: 0.2 % (ref 0.0–2.0)
Basophils Absolute: 0 10*3/uL (ref 0.0–0.1)
EOS ABS: 0.2 10*3/uL (ref 0.0–0.5)
EOS%: 3.7 % (ref 0.0–7.0)
HEMATOCRIT: 31.4 % — AB (ref 34.8–46.6)
HGB: 9.7 g/dL — ABNORMAL LOW (ref 11.6–15.9)
LYMPH#: 1.1 10*3/uL (ref 0.9–3.3)
LYMPH%: 26.6 % (ref 14.0–49.7)
MCH: 27 pg (ref 25.1–34.0)
MCHC: 30.9 g/dL — ABNORMAL LOW (ref 31.5–36.0)
MCV: 87.5 fL (ref 79.5–101.0)
MONO#: 0.2 10*3/uL (ref 0.1–0.9)
MONO%: 5.9 % (ref 0.0–14.0)
NEUT#: 2.6 10*3/uL (ref 1.5–6.5)
NEUT%: 63.6 % (ref 38.4–76.8)
PLATELETS: 201 10*3/uL (ref 145–400)
RBC: 3.59 10*6/uL — AB (ref 3.70–5.45)
RDW: 14.3 % (ref 11.2–14.5)
WBC: 4.1 10*3/uL (ref 3.9–10.3)

## 2014-04-24 LAB — COMPREHENSIVE METABOLIC PANEL (CC13)
ALK PHOS: 105 U/L (ref 40–150)
ALT: 15 U/L (ref 0–55)
ANION GAP: 7 meq/L (ref 3–11)
AST: 23 U/L (ref 5–34)
Albumin: 2.5 g/dL — ABNORMAL LOW (ref 3.5–5.0)
BILIRUBIN TOTAL: 0.21 mg/dL (ref 0.20–1.20)
BUN: 33.5 mg/dL — AB (ref 7.0–26.0)
CALCIUM: 8.9 mg/dL (ref 8.4–10.4)
CHLORIDE: 113 meq/L — AB (ref 98–109)
CO2: 23 mEq/L (ref 22–29)
Creatinine: 3.1 mg/dL (ref 0.6–1.1)
EGFR: 18 mL/min/{1.73_m2} — AB (ref >90)
Glucose: 92 mg/dl (ref 70–140)
POTASSIUM: 4.3 meq/L (ref 3.5–5.1)
SODIUM: 143 meq/L (ref 136–145)
TOTAL PROTEIN: 6.5 g/dL (ref 6.4–8.3)

## 2014-04-24 LAB — LACTATE DEHYDROGENASE (CC13): LDH: 219 U/L (ref 125–245)

## 2014-04-27 ENCOUNTER — Encounter: Payer: Self-pay | Admitting: Internal Medicine

## 2014-04-27 ENCOUNTER — Telehealth: Payer: Self-pay | Admitting: Internal Medicine

## 2014-04-27 ENCOUNTER — Ambulatory Visit (HOSPITAL_BASED_OUTPATIENT_CLINIC_OR_DEPARTMENT_OTHER): Payer: Commercial Managed Care - HMO | Admitting: Internal Medicine

## 2014-04-27 VITALS — BP 162/98 | HR 89 | Temp 98.7°F | Resp 20 | Ht 68.0 in | Wt 304.8 lb

## 2014-04-27 DIAGNOSIS — D472 Monoclonal gammopathy: Secondary | ICD-10-CM | POA: Diagnosis not present

## 2014-04-27 DIAGNOSIS — D890 Polyclonal hypergammaglobulinemia: Principal | ICD-10-CM

## 2014-04-27 DIAGNOSIS — E8809 Other disorders of plasma-protein metabolism, not elsewhere classified: Secondary | ICD-10-CM

## 2014-04-27 NOTE — Telephone Encounter (Signed)
gv adn printed appt sched and avs for pt for May °

## 2014-04-27 NOTE — Progress Notes (Signed)
[facility] [phone]   Fax:(336) [employer], [address] [place]  [address]  DIAGNOSIS: Monoclonal gammopathy of undetermined significance  PRIOR THERAPY: None  CURRENT THERAPY: Observation.  INTERVAL HISTORY: Kristen English 59 y.o. female returns to the clinic today for three-month followup visit. The patient is feeling fine today with no specific complaints. She denied having any significant weight loss or night sweats. She has no nausea or vomiting. The patient denied having any aches and pains. She has no chest pain, shortness of breath, cough or hemoptysis.  She is followed by Dr. Moshe Cipro for her renal dysfunction and she is also on the list for kidney transplant at [facility]. The patient had repeat myeloma panel performed recently and she is here for evaluation and discussion of her lab results.  MEDICAL HISTORY: Past Medical History  Diagnosis Date  . Hypertension   . Diabetes mellitus without complication   . Gout   . Chronic kidney disease     protein in urine  . Arthritis     ALLERGIES:  is allergic to morphine and related.  MEDICATIONS:  Current Outpatient Prescriptions  Medication Sig Dispense Refill  . amLODipine (NORVASC) 10 MG tablet Take 10 mg by mouth at bedtime.     Marland Kitchen aspirin 81 MG tablet Take 81 mg by mouth at bedtime.     . carvedilol (COREG) 25 MG tablet Take 25 mg by mouth 2 (two) times daily with a meal.     . cholecalciferol (VITAMIN D) 1000 UNITS tablet Take 2,000 Units by mouth every morning.     . colchicine 0.6 MG tablet Take 0.6 mg by mouth every 12 (twelve) hours as needed (gout.).     Marland Kitchen ferrous sulfate 325 (65 FE) MG tablet Take 325 mg by mouth daily with breakfast.    . furosemide (LASIX) 40 MG tablet Take 40 mg by mouth 2 (two) times daily as needed for fluid.    Marland Kitchen glipiZIDE (GLUCOTROL) 5 MG tablet Take 5 mg by mouth 3 (three)  times daily with meals.    . hydrOXYzine (ATARAX/VISTARIL) 25 MG tablet Take 25 mg by mouth at bedtime as needed for itching.     . Liraglutide (VICTOZA) 18 MG/3ML SOPN Inject 1.8 mg into the skin daily at 12 noon.     . lovastatin (MEVACOR) 10 MG tablet Take 10 mg by mouth at bedtime.      No current facility-administered medications for this visit.    SURGICAL HISTORY:  Past Surgical History  Procedure Laterality Date  . Foot surgery Left 2007  . Joint replacement  2009    bilateral knees  . Total knee arthroplasty      bilateral    REVIEW OF SYSTEMS:  Constitutional: negative Eyes: negative Ears, nose, mouth, throat, and face: negative Respiratory: negative Cardiovascular: negative Gastrointestinal: negative Genitourinary:negative Integument/breast: negative Hematologic/lymphatic: negative Musculoskeletal:negative Neurological: negative Behavioral/Psych: negative Endocrine: negative Allergic/Immunologic: negative   PHYSICAL EXAMINATION: General appearance: alert, cooperative and no distress Head: Normocephalic, without obvious abnormality, atraumatic Neck: no adenopathy, no JVD, supple, symmetrical, trachea midline and thyroid not enlarged, symmetric, no tenderness/mass/nodules Lymph nodes: Cervical, supraclavicular, and axillary nodes normal. Resp: clear to auscultation bilaterally Back: symmetric, no curvature. ROM normal. No CVA tenderness. Cardio: regular rate and rhythm, S1, S2 normal, no murmur, click, rub or gallop GI: soft, non-tender; bowel sounds normal; no masses,  no organomegaly Extremities: extremities normal,  atraumatic, no cyanosis or edema Neurologic: Alert and oriented X 3, normal strength and tone. Normal symmetric reflexes. Normal coordination and gait  ECOG PERFORMANCE STATUS: 1 - Symptomatic but completely ambulatory  Blood pressure 162/98, pulse 89, temperature 98.7 F (37.1 C), temperature source Oral, resp. rate 20, height '5\' 8"'  (1.727 m),  weight 304 lb 12.8 oz (138.256 kg).  LABORATORY DATA: Lab Results  Component Value Date   WBC 4.1 04/24/2014   HGB 9.7* 04/24/2014   HCT 31.4* 04/24/2014   MCV 87.5 04/24/2014   PLT 201 04/24/2014      Chemistry      Component Value Date/Time   NA 143 04/24/2014 1358   NA 138 02/25/2008 0548   K 4.3 04/24/2014 1358   K 4.2 02/25/2008 0548   CL 102 02/25/2008 0548   CO2 23 04/24/2014 1358   CO2 29 02/25/2008 0548   BUN 33.5* 04/24/2014 1358   BUN 21 02/25/2008 0548   CREATININE 3.1* 04/24/2014 1358   CREATININE 1.32* 02/25/2008 0548      Component Value Date/Time   CALCIUM 8.9 04/24/2014 1358   CALCIUM 9.3 02/25/2008 0548   ALKPHOS 105 04/24/2014 1358   ALKPHOS 97 07/04/2007 2038   AST 23 04/24/2014 1358   AST 21 07/04/2007 2038   ALT 15 04/24/2014 1358   ALT 18 07/04/2007 2038   BILITOT 0.21 04/24/2014 1358   BILITOT 0.3 07/04/2007 2038     Other lab results: Beta-2 microglobulin 12.30, IgG 1140, IgA 411 and IgM 103. Serum light chains are still pending.  RADIOGRAPHIC STUDIES: No results found. 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   She underwent a bone marrow biopsy and aspirate that showed 9% of plasma cells but were nonspecific with prominent background staining for both kappa and lambda light chains. There is no significant evidence for disease progression on the available blood work but the serum light chains are still pending. I discussed the lab results with the patient today. I recommended for her to continue on observation with repeat myeloma panel in 3 months unless there is significant elevation of the serum light chains, I will discuss with the patient other treatment options. Further insufficiency, she will continue her routine follow-up visit and evaluation by Dr. Moshe Cipro. She was  advised to call immediately if she has any concerning symptoms in the interval.  The patient voices understanding of current disease status and treatment options and is in agreement with the current care plan.  All questions were answered. The patient knows to call the clinic with any problems, questions or concerns. We can certainly see the patient much sooner if necessary.  Disclaimer: This note was dictated with voice recognition software. Similar sounding words can inadvertently be transcribed and may not be corrected upon review.

## 2014-04-28 LAB — BETA 2 MICROGLOBULIN, SERUM: BETA 2 MICROGLOBULIN: 12.3 mg/L — AB (ref <=2.51)

## 2014-04-28 LAB — KAPPA/LAMBDA LIGHT CHAINS
KAPPA FREE LGHT CHN: 21.2 mg/dL — AB (ref 0.33–1.94)
Kappa:Lambda Ratio: 2.99 — ABNORMAL HIGH (ref 0.26–1.65)
Lambda Free Lght Chn: 7.09 mg/dL — ABNORMAL HIGH (ref 0.57–2.63)

## 2014-04-28 LAB — IGG, IGA, IGM
IgA: 411 mg/dL — ABNORMAL HIGH (ref 69–380)
IgG (Immunoglobin G), Serum: 1140 mg/dL (ref 690–1700)
IgM, Serum: 103 mg/dL (ref 52–322)

## 2014-05-04 ENCOUNTER — Encounter (HOSPITAL_COMMUNITY): Payer: Self-pay | Admitting: *Deleted

## 2014-05-07 ENCOUNTER — Other Ambulatory Visit: Payer: Self-pay | Admitting: Gastroenterology

## 2014-05-11 ENCOUNTER — Ambulatory Visit (HOSPITAL_COMMUNITY)
Admission: RE | Admit: 2014-05-11 | Discharge: 2014-05-11 | Disposition: A | Discharge status: Home / Self Care | Payer: Commercial Managed Care - HMO | Source: Ambulatory Visit | Attending: Gastroenterology | Admitting: Gastroenterology

## 2014-05-11 ENCOUNTER — Encounter (HOSPITAL_COMMUNITY): Payer: Self-pay | Admitting: *Deleted

## 2014-05-11 ENCOUNTER — Ambulatory Visit (HOSPITAL_COMMUNITY): Payer: Commercial Managed Care - HMO | Admitting: Anesthesiology

## 2014-05-11 ENCOUNTER — Encounter (HOSPITAL_COMMUNITY)
Admission: RE | Disposition: A | Discharge status: Home / Self Care | Payer: Commercial Managed Care - HMO | Source: Ambulatory Visit | Attending: Gastroenterology

## 2014-05-11 DIAGNOSIS — E119 Type 2 diabetes mellitus without complications: Secondary | ICD-10-CM | POA: Insufficient documentation

## 2014-05-11 DIAGNOSIS — E780 Pure hypercholesterolemia: Secondary | ICD-10-CM | POA: Insufficient documentation

## 2014-05-11 DIAGNOSIS — Z1211 Encounter for screening for malignant neoplasm of colon: Secondary | ICD-10-CM | POA: Insufficient documentation

## 2014-05-11 DIAGNOSIS — I129 Hypertensive chronic kidney disease with stage 1 through stage 4 chronic kidney disease, or unspecified chronic kidney disease: Secondary | ICD-10-CM | POA: Diagnosis not present

## 2014-05-11 DIAGNOSIS — M170 Bilateral primary osteoarthritis of knee: Secondary | ICD-10-CM | POA: Diagnosis not present

## 2014-05-11 DIAGNOSIS — N189 Chronic kidney disease, unspecified: Secondary | ICD-10-CM | POA: Diagnosis not present

## 2014-05-11 DIAGNOSIS — K5730 Diverticulosis of large intestine without perforation or abscess without bleeding: Secondary | ICD-10-CM | POA: Diagnosis not present

## 2014-05-11 DIAGNOSIS — M109 Gout, unspecified: Secondary | ICD-10-CM | POA: Insufficient documentation

## 2014-05-11 DIAGNOSIS — Z6841 Body Mass Index (BMI) 40.0 and over, adult: Secondary | ICD-10-CM | POA: Insufficient documentation

## 2014-05-11 HISTORY — PX: COLONOSCOPY WITH PROPOFOL: SHX5780

## 2014-05-11 SURGERY — COLONOSCOPY WITH PROPOFOL
Anesthesia: Monitor Anesthesia Care

## 2014-05-11 MED ORDER — PROPOFOL 10 MG/ML IV BOLUS
INTRAVENOUS | Status: AC
  Filled 2014-05-11: qty 20

## 2014-05-11 MED ORDER — SODIUM CHLORIDE 0.9 % IV SOLN
INTRAVENOUS | Status: DC | PRN
  Administered 2014-05-11: 12:00:00 via INTRAVENOUS

## 2014-05-11 MED ORDER — SODIUM CHLORIDE 0.9 % IV SOLN
INTRAVENOUS | Status: DC
  Administered 2014-05-11: 1000 mL via INTRAVENOUS

## 2014-05-11 MED ORDER — SODIUM CHLORIDE 0.9 % IV SOLN: INTRAVENOUS | Status: DC

## 2014-05-11 MED ORDER — LACTATED RINGERS IV SOLN: INTRAVENOUS | Status: DC

## 2014-05-11 MED ORDER — PROPOFOL INFUSION 10 MG/ML OPTIME
INTRAVENOUS | Status: DC | PRN
  Administered 2014-05-11: 140 ug/kg/min via INTRAVENOUS

## 2014-05-11 MED ORDER — PROPOFOL 10 MG/ML IV EMUL
INTRAVENOUS | Status: DC | PRN
  Administered 2014-05-11: 50 mg via INTRAVENOUS
  Administered 2014-05-11: 20 mg via INTRAVENOUS

## 2014-05-11 MED ORDER — LIDOCAINE HCL (CARDIAC) 20 MG/ML IV SOLN
INTRAVENOUS | Status: AC
  Filled 2014-05-11: qty 5

## 2014-05-11 SURGICAL SUPPLY — 22 items

## 2014-05-11 NOTE — Transfer of Care (Signed)
Immediate Anesthesia Transfer of Care Note  Patient: Kristen English  Procedure(s) Performed: Procedure(s): COLONOSCOPY WITH PROPOFOL (N/A)  Patient Location: PACU  Anesthesia Type:MAC  Level of Consciousness: awake, alert  and oriented  Airway & Oxygen Therapy: Patient Spontanous Breathing and Patient connected to face mask oxygen  Post-op Assessment: Report given to RN and Post -op Vital signs reviewed and stable  Post vital signs: Reviewed and stable  Last Vitals:  Filed Vitals:   05/11/14 1119  BP: 156/84  Temp: 36.7 C  Resp: 17    Complications: No apparent anesthesia complications

## 2014-05-11 NOTE — H&P (Signed)
  Procedure: Screening colonoscopy. Normal screening colonoscopy performed on 03/04/2004  History: The patient is a 59 year old female born Aug 17, 1955. She is scheduled to undergo a repeat screening colonoscopy with polypectomy to prevent colon cancer.  Past medical history: Type 2 diabetes mellitus with microalbuminuria. Hypertension. Hypercholesterolemia. Osteoarthritis of both knees. Stage for chronic kidney disease. Gout. Bilateral total knee replacement surgeries. Left foot surgery with metal plates. D&C.  Medication allergies: Morphine. ACE inhibitor caused elevation in the creatinine.  Exam: The patient is alert and lying comfortably on the endoscopy stretcher. Abdomen is soft and nontender to palpation. Lungs are clear to auscultation. Cardiac exam reveals a regular rhythm.  Plan: Proceed with repeat screening colonoscopy

## 2014-05-11 NOTE — Op Note (Signed)
Procedure: Screening colonoscopy. Normal screening colonoscopy performed on 03/04/2004  Endoscopist: Earle Gell  Premedication: Propofol administered by anesthesia  Procedure: The patient was placed in the left lateral decubitus position. Anal inspection and digital rectal exam were normal. The Pentax pediatric colonoscope was introduced into the rectum and advanced to the cecum. A normal-appearing appendiceal orifice was identified. A normal-appearing ileocecal valve was identified. Colonic preparation for the exam today was good. Withdrawal time was 10 minutes.  Rectum. Normal. Retroflexed view of the distal rectum normal  Sigmoid colon and descending colon. A few small diverticula were present.  Splenic flexure. Normal  Transverse colon. Normal  Hepatic flexure. Normal  Ascending colon. Normal  Cecum and ileocecal valve. Normal  Assessment: Normal screening colonoscopy  Recommendation: Schedule repeat screening colonoscopy in 10 years

## 2014-05-11 NOTE — Anesthesia Preprocedure Evaluation (Addendum)
Anesthesia Evaluation  Patient identified by MRN, date of birth, ID band Patient awake    Reviewed: Allergy & Precautions, H&P , NPO status , Patient's Chart, lab work & pertinent test results, reviewed documented beta blocker date and time   Airway Mallampati: II  TM Distance: >3 FB Neck ROM: Full    Dental no notable dental hx. (+) Edentulous Upper, Dental Advisory Given   Pulmonary neg pulmonary ROS,  breath sounds clear to auscultation  Pulmonary exam normal       Cardiovascular hypertension, Pt. on medications and Pt. on home beta blockers Rhythm:Regular Rate:Normal     Neuro/Psych negative neurological ROS  negative psych ROS   GI/Hepatic negative GI ROS, Neg liver ROS,   Endo/Other  diabetes, Type 2, Oral Hypoglycemic AgentsMorbid obesity  Renal/GU Renal InsufficiencyRenal disease  negative genitourinary   Musculoskeletal  (+) Arthritis -, Osteoarthritis,    Abdominal   Peds  Hematology negative hematology ROS (+)   Anesthesia Other Findings   Reproductive/Obstetrics negative OB ROS                           Anesthesia Physical Anesthesia Plan  ASA: III  Anesthesia Plan: MAC   Post-op Pain Management:    Induction: Intravenous  Airway Management Planned: Simple Face Mask  Additional Equipment:   Intra-op Plan:   Post-operative Plan:   Informed Consent: I have reviewed the patients History and Physical, chart, labs and discussed the procedure including the risks, benefits and alternatives for the proposed anesthesia with the patient or authorized representative who has indicated his/her understanding and acceptance.   Dental advisory given  Plan Discussed with: CRNA  Anesthesia Plan Comments:         Anesthesia Quick Evaluation

## 2014-05-11 NOTE — Discharge Instructions (Signed)
Colonoscopy, Care After °These instructions give you information on caring for yourself after your procedure. Your doctor may also give you more specific instructions. Call your doctor if you have any problems or questions after your procedure. °HOME CARE °· Do not drive for 24 hours. °· Do not sign important papers or use machinery for 24 hours. °· You may shower. °· You may go back to your usual activities, but go slower for the first 24 hours. °· Take rest breaks often during the first 24 hours. °· Walk around or use warm packs on your belly (abdomen) if you have belly cramping or gas. °· Drink enough fluids to keep your pee (urine) clear or pale yellow. °· Resume your normal diet. Avoid heavy or fried foods. °· Avoid drinking alcohol for 24 hours or as told by your doctor. °· Only take medicines as told by your doctor. °If a tissue sample (biopsy) was taken during the procedure:  °· Do not take aspirin or blood thinners for 7 days, or as told by your doctor. °· Do not drink alcohol for 7 days, or as told by your doctor. °· Eat soft foods for the first 24 hours. °GET HELP IF: °You still have a small amount of blood in your poop (stool) 2-3 days after the procedure. °GET HELP RIGHT AWAY IF: °· You have more than a small amount of blood in your poop. °· You see clumps of tissue (blood clots) in your poop. °· Your belly is puffy (swollen). °· You feel sick to your stomach (nauseous) or throw up (vomit). °· You have a fever. °· You have belly pain that gets worse and medicine does not help. °MAKE SURE YOU: °· Understand these instructions. °· Will watch your condition. °· Will get help right away if you are not doing well or get worse. °Document Released: 04/08/2010 Document Revised: 03/11/2013 Document Reviewed: 11/11/2012 °[employer]® Patient Information ©2015 [employer]. This information is not intended to replace advice given to you by your health care provider. Make sure you discuss any questions you have with  your health care provider. ° °

## 2014-05-11 NOTE — Anesthesia Postprocedure Evaluation (Signed)
  Anesthesia Post-op Note  Patient: Kristen English  Procedure(s) Performed: Procedure(s): COLONOSCOPY WITH PROPOFOL (N/A)  Patient Location: PACU  Anesthesia Type: MAC  Level of Consciousness: awake and alert   Airway and Oxygen Therapy: Patient Spontanous Breathing  Post-op Pain: none  Post-op Assessment: Post-op Vital signs reviewed, Patient's Cardiovascular Status Stable and Respiratory Function Stable  Post-op Vital Signs: Reviewed  Filed Vitals:   05/11/14 1230  BP:   Pulse: 76  Temp: 36.6 C  Resp: 13    Complications: No apparent anesthesia complications

## 2014-05-12 ENCOUNTER — Encounter (HOSPITAL_COMMUNITY): Payer: Self-pay | Admitting: Gastroenterology

## 2014-05-12 LAB — GLUCOSE, CAPILLARY: Glucose-Capillary: 142 mg/dL — ABNORMAL HIGH (ref 70–99)

## 2014-05-12 LAB — POCT I-STAT 4, (NA,K, GLUC, HGB,HCT)
Glucose, Bld: 143 mg/dL — ABNORMAL HIGH (ref 70–99)
HEMATOCRIT: 30 % — AB (ref 36.0–46.0)
Hemoglobin: 10.2 g/dL — ABNORMAL LOW (ref 12.0–15.0)
Potassium: 4.2 mmol/L (ref 3.5–5.1)
Sodium: 142 mmol/L (ref 135–145)

## 2014-05-17 DIAGNOSIS — E785 Hyperlipidemia, unspecified: Secondary | ICD-10-CM | POA: Diagnosis not present

## 2014-05-17 DIAGNOSIS — I129 Hypertensive chronic kidney disease with stage 1 through stage 4 chronic kidney disease, or unspecified chronic kidney disease: Secondary | ICD-10-CM | POA: Diagnosis not present

## 2014-05-17 DIAGNOSIS — E1122 Type 2 diabetes mellitus with diabetic chronic kidney disease: Secondary | ICD-10-CM | POA: Diagnosis not present

## 2014-05-17 DIAGNOSIS — N183 Chronic kidney disease, stage 3 (moderate): Secondary | ICD-10-CM | POA: Diagnosis not present

## 2014-05-17 DIAGNOSIS — Z6841 Body Mass Index (BMI) 40.0 and over, adult: Secondary | ICD-10-CM | POA: Diagnosis not present

## 2014-05-25 DIAGNOSIS — D472 Monoclonal gammopathy: Secondary | ICD-10-CM | POA: Diagnosis not present

## 2014-05-25 DIAGNOSIS — Z23 Encounter for immunization: Secondary | ICD-10-CM | POA: Diagnosis not present

## 2014-05-25 DIAGNOSIS — N184 Chronic kidney disease, stage 4 (severe): Secondary | ICD-10-CM | POA: Diagnosis not present

## 2014-05-25 DIAGNOSIS — E1122 Type 2 diabetes mellitus with diabetic chronic kidney disease: Secondary | ICD-10-CM | POA: Diagnosis not present

## 2014-05-25 DIAGNOSIS — Z6841 Body Mass Index (BMI) 40.0 and over, adult: Secondary | ICD-10-CM | POA: Diagnosis not present

## 2014-05-25 DIAGNOSIS — I10 Essential (primary) hypertension: Secondary | ICD-10-CM | POA: Diagnosis not present

## 2014-05-25 DIAGNOSIS — E782 Mixed hyperlipidemia: Secondary | ICD-10-CM | POA: Diagnosis not present

## 2014-06-17 DIAGNOSIS — Z01818 Encounter for other preprocedural examination: Secondary | ICD-10-CM | POA: Diagnosis not present

## 2014-06-17 DIAGNOSIS — I129 Hypertensive chronic kidney disease with stage 1 through stage 4 chronic kidney disease, or unspecified chronic kidney disease: Secondary | ICD-10-CM | POA: Diagnosis not present

## 2014-06-17 DIAGNOSIS — E785 Hyperlipidemia, unspecified: Secondary | ICD-10-CM | POA: Diagnosis not present

## 2014-06-17 DIAGNOSIS — M109 Gout, unspecified: Secondary | ICD-10-CM | POA: Diagnosis not present

## 2014-06-17 DIAGNOSIS — N183 Chronic kidney disease, stage 3 (moderate): Secondary | ICD-10-CM | POA: Diagnosis not present

## 2014-06-17 DIAGNOSIS — Z6841 Body Mass Index (BMI) 40.0 and over, adult: Secondary | ICD-10-CM | POA: Diagnosis not present

## 2014-06-17 DIAGNOSIS — Z7982 Long term (current) use of aspirin: Secondary | ICD-10-CM | POA: Diagnosis not present

## 2014-06-17 DIAGNOSIS — R600 Localized edema: Secondary | ICD-10-CM | POA: Diagnosis not present

## 2014-06-17 DIAGNOSIS — E119 Type 2 diabetes mellitus without complications: Secondary | ICD-10-CM | POA: Diagnosis not present

## 2014-06-22 DIAGNOSIS — E785 Hyperlipidemia, unspecified: Secondary | ICD-10-CM | POA: Diagnosis not present

## 2014-06-22 DIAGNOSIS — I10 Essential (primary) hypertension: Secondary | ICD-10-CM | POA: Diagnosis not present

## 2014-06-22 DIAGNOSIS — N183 Chronic kidney disease, stage 3 (moderate): Secondary | ICD-10-CM | POA: Diagnosis not present

## 2014-06-22 DIAGNOSIS — E119 Type 2 diabetes mellitus without complications: Secondary | ICD-10-CM | POA: Diagnosis not present

## 2014-06-29 DIAGNOSIS — I151 Hypertension secondary to other renal disorders: Secondary | ICD-10-CM | POA: Diagnosis not present

## 2014-06-29 DIAGNOSIS — Z01818 Encounter for other preprocedural examination: Secondary | ICD-10-CM | POA: Diagnosis not present

## 2014-06-29 DIAGNOSIS — K839 Disease of biliary tract, unspecified: Secondary | ICD-10-CM | POA: Diagnosis not present

## 2014-06-29 DIAGNOSIS — M109 Gout, unspecified: Secondary | ICD-10-CM | POA: Insufficient documentation

## 2014-06-29 DIAGNOSIS — E669 Obesity, unspecified: Secondary | ICD-10-CM | POA: Insufficient documentation

## 2014-06-29 DIAGNOSIS — I129 Hypertensive chronic kidney disease with stage 1 through stage 4 chronic kidney disease, or unspecified chronic kidney disease: Secondary | ICD-10-CM | POA: Diagnosis not present

## 2014-06-29 DIAGNOSIS — Z885 Allergy status to narcotic agent status: Secondary | ICD-10-CM | POA: Diagnosis not present

## 2014-06-29 DIAGNOSIS — G479 Sleep disorder, unspecified: Secondary | ICD-10-CM

## 2014-06-29 DIAGNOSIS — I10 Essential (primary) hypertension: Secondary | ICD-10-CM | POA: Insufficient documentation

## 2014-06-29 DIAGNOSIS — N186 End stage renal disease: Secondary | ICD-10-CM | POA: Diagnosis not present

## 2014-06-29 DIAGNOSIS — D472 Monoclonal gammopathy: Secondary | ICD-10-CM | POA: Diagnosis not present

## 2014-06-29 DIAGNOSIS — M1000 Idiopathic gout, unspecified site: Secondary | ICD-10-CM | POA: Diagnosis not present

## 2014-06-29 DIAGNOSIS — N184 Chronic kidney disease, stage 4 (severe): Secondary | ICD-10-CM | POA: Diagnosis not present

## 2014-06-29 DIAGNOSIS — E119 Type 2 diabetes mellitus without complications: Secondary | ICD-10-CM | POA: Diagnosis not present

## 2014-06-29 DIAGNOSIS — E1122 Type 2 diabetes mellitus with diabetic chronic kidney disease: Secondary | ICD-10-CM | POA: Insufficient documentation

## 2014-06-29 DIAGNOSIS — N2889 Other specified disorders of kidney and ureter: Secondary | ICD-10-CM | POA: Diagnosis not present

## 2014-06-29 HISTORY — DX: Sleep disorder, unspecified: G47.9

## 2014-06-30 DIAGNOSIS — T80319A ABO incompatibility with hemolytic transfusion reaction, unspecified, initial encounter: Secondary | ICD-10-CM | POA: Diagnosis not present

## 2014-07-20 ENCOUNTER — Other Ambulatory Visit (HOSPITAL_BASED_OUTPATIENT_CLINIC_OR_DEPARTMENT_OTHER): Payer: Commercial Managed Care - HMO

## 2014-07-20 DIAGNOSIS — D890 Polyclonal hypergammaglobulinemia: Principal | ICD-10-CM

## 2014-07-20 DIAGNOSIS — E8809 Other disorders of plasma-protein metabolism, not elsewhere classified: Secondary | ICD-10-CM

## 2014-07-20 DIAGNOSIS — D472 Monoclonal gammopathy: Secondary | ICD-10-CM | POA: Diagnosis not present

## 2014-07-20 DIAGNOSIS — W108XXA Fall (on) (from) other stairs and steps, initial encounter: Secondary | ICD-10-CM | POA: Diagnosis not present

## 2014-07-20 LAB — LACTATE DEHYDROGENASE (CC13): LDH: 232 U/L (ref 125–245)

## 2014-07-20 LAB — CBC WITH DIFFERENTIAL/PLATELET
BASO%: 0.2 % (ref 0.0–2.0)
Basophils Absolute: 0 10*3/uL (ref 0.0–0.1)
EOS%: 0.8 % (ref 0.0–7.0)
Eosinophils Absolute: 0 10*3/uL (ref 0.0–0.5)
HEMATOCRIT: 32.1 % — AB (ref 34.8–46.6)
HEMOGLOBIN: 10 g/dL — AB (ref 11.6–15.9)
LYMPH#: 1.2 10*3/uL (ref 0.9–3.3)
LYMPH%: 22.2 % (ref 14.0–49.7)
MCH: 27.3 pg (ref 25.1–34.0)
MCHC: 31.2 g/dL — AB (ref 31.5–36.0)
MCV: 87.7 fL (ref 79.5–101.0)
MONO#: 0.3 10*3/uL (ref 0.1–0.9)
MONO%: 5 % (ref 0.0–14.0)
NEUT%: 71.8 % (ref 38.4–76.8)
NEUTROS ABS: 3.8 10*3/uL (ref 1.5–6.5)
Platelets: 205 10*3/uL (ref 145–400)
RBC: 3.66 10*6/uL — AB (ref 3.70–5.45)
RDW: 13.7 % (ref 11.2–14.5)
WBC: 5.2 10*3/uL (ref 3.9–10.3)
nRBC: 0 % (ref 0–0)

## 2014-07-20 LAB — COMPREHENSIVE METABOLIC PANEL (CC13)
ALK PHOS: 117 U/L (ref 40–150)
ALT: 16 U/L (ref 0–55)
AST: 20 U/L (ref 5–34)
Albumin: 2.7 g/dL — ABNORMAL LOW (ref 3.5–5.0)
Anion Gap: 10 mEq/L (ref 3–11)
BILIRUBIN TOTAL: 0.21 mg/dL (ref 0.20–1.20)
BUN: 34.3 mg/dL — ABNORMAL HIGH (ref 7.0–26.0)
CALCIUM: 8.9 mg/dL (ref 8.4–10.4)
CHLORIDE: 110 meq/L — AB (ref 98–109)
CO2: 23 meq/L (ref 22–29)
Creatinine: 3.2 mg/dL (ref 0.6–1.1)
EGFR: 18 mL/min/{1.73_m2} — ABNORMAL LOW (ref >90)
Glucose: 131 mg/dl (ref 70–140)
Potassium: 3.7 mEq/L (ref 3.5–5.1)
Sodium: 143 mEq/L (ref 136–145)
Total Protein: 6.7 g/dL (ref 6.4–8.3)

## 2014-07-22 DIAGNOSIS — N261 Atrophy of kidney (terminal): Secondary | ICD-10-CM | POA: Diagnosis not present

## 2014-07-22 DIAGNOSIS — Z01810 Encounter for preprocedural cardiovascular examination: Secondary | ICD-10-CM | POA: Diagnosis not present

## 2014-07-22 DIAGNOSIS — N184 Chronic kidney disease, stage 4 (severe): Secondary | ICD-10-CM | POA: Diagnosis not present

## 2014-07-22 DIAGNOSIS — E119 Type 2 diabetes mellitus without complications: Secondary | ICD-10-CM | POA: Diagnosis not present

## 2014-07-22 DIAGNOSIS — I358 Other nonrheumatic aortic valve disorders: Secondary | ICD-10-CM | POA: Diagnosis not present

## 2014-07-22 DIAGNOSIS — N281 Cyst of kidney, acquired: Secondary | ICD-10-CM | POA: Diagnosis not present

## 2014-07-22 DIAGNOSIS — D734 Cyst of spleen: Secondary | ICD-10-CM | POA: Diagnosis not present

## 2014-07-22 DIAGNOSIS — I129 Hypertensive chronic kidney disease with stage 1 through stage 4 chronic kidney disease, or unspecified chronic kidney disease: Secondary | ICD-10-CM | POA: Diagnosis not present

## 2014-07-22 DIAGNOSIS — I517 Cardiomegaly: Secondary | ICD-10-CM | POA: Diagnosis not present

## 2014-07-22 DIAGNOSIS — Z01818 Encounter for other preprocedural examination: Secondary | ICD-10-CM | POA: Diagnosis not present

## 2014-07-22 LAB — KAPPA/LAMBDA LIGHT CHAINS
Kappa free light chain: 16 mg/dL — ABNORMAL HIGH (ref 0.33–1.94)
Kappa:Lambda Ratio: 2.38 — ABNORMAL HIGH (ref 0.26–1.65)
Lambda Free Lght Chn: 6.73 mg/dL — ABNORMAL HIGH (ref 0.57–2.63)

## 2014-07-22 LAB — IGG, IGA, IGM
IGA: 420 mg/dL — AB (ref 69–380)
IGG (IMMUNOGLOBIN G), SERUM: 1240 mg/dL (ref 690–1700)
IGM, SERUM: 108 mg/dL (ref 52–322)

## 2014-07-22 LAB — BETA 2 MICROGLOBULIN, SERUM: BETA 2 MICROGLOBULIN: 8.71 mg/L — AB (ref <=2.51)

## 2014-07-27 ENCOUNTER — Encounter: Payer: Self-pay | Admitting: Internal Medicine

## 2014-07-27 ENCOUNTER — Ambulatory Visit (HOSPITAL_BASED_OUTPATIENT_CLINIC_OR_DEPARTMENT_OTHER): Payer: Commercial Managed Care - HMO | Admitting: Internal Medicine

## 2014-07-27 ENCOUNTER — Telehealth: Payer: Self-pay | Admitting: Internal Medicine

## 2014-07-27 VITALS — BP 144/85 | HR 83 | Temp 98.0°F | Resp 18 | Ht 68.0 in | Wt 305.5 lb

## 2014-07-27 DIAGNOSIS — D890 Polyclonal hypergammaglobulinemia: Principal | ICD-10-CM

## 2014-07-27 DIAGNOSIS — N289 Disorder of kidney and ureter, unspecified: Secondary | ICD-10-CM

## 2014-07-27 DIAGNOSIS — E8809 Other disorders of plasma-protein metabolism, not elsewhere classified: Secondary | ICD-10-CM

## 2014-07-27 DIAGNOSIS — D472 Monoclonal gammopathy: Secondary | ICD-10-CM

## 2014-07-27 NOTE — Telephone Encounter (Signed)
per pof to sch pt appt-gave pt copy of sch °

## 2014-07-27 NOTE — Progress Notes (Signed)
Kristen English [phone]   Fax:(336) [place] [address] [place] [address]  DIAGNOSIS: Monoclonal gammopathy of undetermined significance  PRIOR THERAPY: None  CURRENT THERAPY: Observation.  INTERVAL HISTORY: Kristen English 59 y.o. female returns to the clinic today for three-month followup visit. The patient is feeling fine today with no specific complaints. She exercises at regular basis and trying to lose weight before her kidney transplant. She denied having any significant weight loss or night sweats. She has no nausea or vomiting. The patient denied having any aches and pains. She has no chest pain, shortness of breath, cough or hemoptysis.  The patient had repeat myeloma panel performed recently and she is here for evaluation and discussion of her lab results.  MEDICAL HISTORY: Past Medical History  Diagnosis Date  . Hypertension   . Diabetes mellitus without complication   . Gout   . Chronic kidney disease     protein in urine  . Arthritis     ALLERGIES:  is allergic to morphine and related.  MEDICATIONS:  Current Outpatient Prescriptions  Medication Sig Dispense Refill  . amLODipine (NORVASC) 10 MG tablet Take 10 mg by mouth at bedtime.     Marland Kitchen aspirin 81 MG tablet Take 81 mg by mouth at bedtime.     . carvedilol (COREG) 25 MG tablet Take 25 mg by mouth 2 (two) times daily with a meal.     . cholecalciferol (VITAMIN D) 1000 UNITS tablet Take 2,000 Units by mouth every morning.     . colchicine 0.6 MG tablet Take 0.6 mg by mouth every 12 (twelve) hours as needed (gout.).     Marland Kitchen ferrous sulfate 325 (65 FE) MG tablet Take 325 mg by mouth daily with breakfast.    . furosemide (LASIX) 40 MG tablet Take 40 mg by mouth 2 (two) times daily as needed for fluid.    Marland Kitchen glipiZIDE (GLUCOTROL) 5 MG tablet Take 5 mg by mouth 3 (three) times daily with meals.    . hydrOXYzine  (ATARAX/VISTARIL) 25 MG tablet Take 25 mg by mouth at bedtime as needed for itching.     . Liraglutide (VICTOZA) 18 MG/3ML SOPN Inject 1.8 mg into the skin daily at 12 noon.     . lovastatin (MEVACOR) 10 MG tablet Take 10 mg by mouth at bedtime.     Marland Kitchen ZOSTAVAX [id number] UNT/0.65ML injection      No current facility-administered medications for this visit.    SURGICAL HISTORY:  Past Surgical History  Procedure Laterality Date  . Foot surgery Left 2007  . Joint replacement  2009    bilateral knees  . Total knee arthroplasty      bilateral  . Colonoscopy with propofol N/A 05/11/2014    Procedure: COLONOSCOPY WITH PROPOFOL;  Surgeon: Kristen Fair, MD;  Location: [facility];  Service: Endoscopy;  Laterality: N/A;    REVIEW OF SYSTEMS:  Constitutional: negative Eyes: negative Ears, nose, mouth, throat, and face: negative Respiratory: negative Cardiovascular: negative Gastrointestinal: negative Genitourinary:negative Integument/breast: negative Hematologic/lymphatic: negative Musculoskeletal:negative Neurological: negative Behavioral/Psych: negative Endocrine: negative Allergic/Immunologic: negative   PHYSICAL EXAMINATION: General appearance: alert, cooperative and no distress Head: Normocephalic, without obvious abnormality, atraumatic Neck: no adenopathy, no JVD, supple, symmetrical, trachea midline and thyroid not enlarged, symmetric, no tenderness/mass/nodules Lymph nodes: Cervical, supraclavicular, and axillary nodes normal. Resp: clear to auscultation bilaterally Back: symmetric, no curvature. ROM normal. No CVA  tenderness. Cardio: regular rate and rhythm, S1, S2 normal, no murmur, click, rub or gallop GI: soft, non-tender; bowel sounds normal; no masses,  no organomegaly Extremities: extremities normal, atraumatic, no cyanosis or edema Neurologic: Alert and oriented X 3, normal strength and tone. Normal symmetric reflexes. Normal coordination and gait  ECOG PERFORMANCE  STATUS: 1 - Symptomatic but completely ambulatory  Blood pressure 144/85, pulse 83, temperature 98 F (36.7 C), temperature source Oral, resp. rate 18, height 5\' 8"  (1.727 m), weight 305 lb 8 oz (138.574 kg), SpO2 100 %.  LABORATORY DATA: Lab Results  Component Value Date   WBC 5.2 07/20/2014   HGB 10.0* 07/20/2014   HCT 32.1* 07/20/2014   MCV 87.7 07/20/2014   PLT 205 07/20/2014      Chemistry      Component Value Date/Time   NA 143 07/20/2014 1322   NA 142 05/11/2014 1135   K 3.7 07/20/2014 1322   K 4.2 05/11/2014 1135   CL 102 02/25/2008 0548   CO2 23 07/20/2014 1322   CO2 29 02/25/2008 0548   BUN 34.3* 07/20/2014 1322   BUN 21 02/25/2008 0548   CREATININE 3.2* 07/20/2014 1322   CREATININE 1.32* 02/25/2008 0548      Component Value Date/Time   CALCIUM 8.9 07/20/2014 1322   CALCIUM 9.3 02/25/2008 0548   ALKPHOS 117 07/20/2014 1322   ALKPHOS 97 07/04/2007 2038   AST 20 07/20/2014 1322   AST 21 07/04/2007 2038   ALT 16 07/20/2014 1322   ALT 18 07/04/2007 2038   BILITOT 0.21 07/20/2014 1322   BILITOT 0.3 07/04/2007 2038     Other lab results: Beta-2 microglobulin 8.71, IgG 1240, IgA 420 and IgM 108. Free kappa light chain 16.00, free lambda light chain 6.73 with a kappa/lambda ratio 2.38.  RADIOGRAPHIC STUDIES: No results found. 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  The recent myeloma panel showed no significant evidence for disease progression. I discussed the lab results with the patient today. I recommended for her to continue on observation with repeat myeloma panel in 3 months. For the renal insufficiency, she will continue her routine follow-up visit and evaluation by Dr. Moshe English and currently undergoing evaluation at [facility] for kidney transplant. She was advised to call  immediately if she has any concerning symptoms in the interval.  The patient voices understanding of current disease status and treatment options and is in agreement with the current care plan.  All questions were answered. The patient knows to call the clinic with any problems, questions or concerns. We can certainly see the patient much sooner if necessary.  Disclaimer: This note was dictated with voice recognition software. Similar sounding words can inadvertently be transcribed and may not be corrected upon review.

## 2014-07-31 DIAGNOSIS — N261 Atrophy of kidney (terminal): Secondary | ICD-10-CM | POA: Diagnosis not present

## 2014-07-31 DIAGNOSIS — N184 Chronic kidney disease, stage 4 (severe): Secondary | ICD-10-CM | POA: Diagnosis not present

## 2014-07-31 DIAGNOSIS — I129 Hypertensive chronic kidney disease with stage 1 through stage 4 chronic kidney disease, or unspecified chronic kidney disease: Secondary | ICD-10-CM | POA: Diagnosis not present

## 2014-07-31 DIAGNOSIS — D734 Cyst of spleen: Secondary | ICD-10-CM | POA: Diagnosis not present

## 2014-07-31 DIAGNOSIS — Z01810 Encounter for preprocedural cardiovascular examination: Secondary | ICD-10-CM | POA: Diagnosis not present

## 2014-07-31 DIAGNOSIS — N281 Cyst of kidney, acquired: Secondary | ICD-10-CM | POA: Diagnosis not present

## 2014-07-31 DIAGNOSIS — E119 Type 2 diabetes mellitus without complications: Secondary | ICD-10-CM | POA: Diagnosis not present

## 2014-07-31 DIAGNOSIS — Z01818 Encounter for other preprocedural examination: Secondary | ICD-10-CM | POA: Diagnosis not present

## 2014-09-25 DIAGNOSIS — I10 Essential (primary) hypertension: Secondary | ICD-10-CM | POA: Diagnosis not present

## 2014-09-25 DIAGNOSIS — E1122 Type 2 diabetes mellitus with diabetic chronic kidney disease: Secondary | ICD-10-CM | POA: Diagnosis not present

## 2014-09-25 DIAGNOSIS — Z0000 Encounter for general adult medical examination without abnormal findings: Secondary | ICD-10-CM | POA: Diagnosis not present

## 2014-09-25 DIAGNOSIS — N184 Chronic kidney disease, stage 4 (severe): Secondary | ICD-10-CM | POA: Diagnosis not present

## 2014-09-25 DIAGNOSIS — D472 Monoclonal gammopathy: Secondary | ICD-10-CM | POA: Diagnosis not present

## 2014-09-25 DIAGNOSIS — E782 Mixed hyperlipidemia: Secondary | ICD-10-CM | POA: Diagnosis not present

## 2014-09-25 DIAGNOSIS — M109 Gout, unspecified: Secondary | ICD-10-CM | POA: Diagnosis not present

## 2014-10-06 DIAGNOSIS — I10 Essential (primary) hypertension: Secondary | ICD-10-CM | POA: Diagnosis not present

## 2014-10-06 DIAGNOSIS — N183 Chronic kidney disease, stage 3 (moderate): Secondary | ICD-10-CM | POA: Diagnosis not present

## 2014-10-06 DIAGNOSIS — E119 Type 2 diabetes mellitus without complications: Secondary | ICD-10-CM | POA: Diagnosis not present

## 2014-10-06 DIAGNOSIS — E785 Hyperlipidemia, unspecified: Secondary | ICD-10-CM | POA: Diagnosis not present

## 2014-10-21 ENCOUNTER — Other Ambulatory Visit (HOSPITAL_BASED_OUTPATIENT_CLINIC_OR_DEPARTMENT_OTHER): Payer: Commercial Managed Care - HMO

## 2014-10-21 DIAGNOSIS — E8809 Other disorders of plasma-protein metabolism, not elsewhere classified: Secondary | ICD-10-CM | POA: Diagnosis not present

## 2014-10-21 DIAGNOSIS — D472 Monoclonal gammopathy: Secondary | ICD-10-CM

## 2014-10-21 DIAGNOSIS — D890 Polyclonal hypergammaglobulinemia: Principal | ICD-10-CM

## 2014-10-21 DIAGNOSIS — W108XXA Fall (on) (from) other stairs and steps, initial encounter: Secondary | ICD-10-CM | POA: Diagnosis not present

## 2014-10-21 LAB — COMPREHENSIVE METABOLIC PANEL (CC13)
ALBUMIN: 3 g/dL — AB (ref 3.5–5.0)
ALT: 22 U/L (ref 0–55)
AST: 27 U/L (ref 5–34)
Alkaline Phosphatase: 103 U/L (ref 40–150)
Anion Gap: 6 mEq/L (ref 3–11)
BUN: 35.1 mg/dL — ABNORMAL HIGH (ref 7.0–26.0)
CO2: 26 mEq/L (ref 22–29)
CREATININE: 3.3 mg/dL — AB (ref 0.6–1.1)
Calcium: 9.2 mg/dL (ref 8.4–10.4)
Chloride: 108 mEq/L (ref 98–109)
EGFR: 16 mL/min/{1.73_m2} — AB (ref >90)
Glucose: 143 mg/dl — ABNORMAL HIGH (ref 70–140)
Potassium: 3.8 mEq/L (ref 3.5–5.1)
Sodium: 140 mEq/L (ref 136–145)
TOTAL PROTEIN: 7.2 g/dL (ref 6.4–8.3)
Total Bilirubin: 0.23 mg/dL (ref 0.20–1.20)

## 2014-10-21 LAB — CBC WITH DIFFERENTIAL/PLATELET
BASO%: 0.8 % (ref 0.0–2.0)
BASOS ABS: 0 10*3/uL (ref 0.0–0.1)
EOS ABS: 0.1 10*3/uL (ref 0.0–0.5)
EOS%: 2 % (ref 0.0–7.0)
HCT: 32.4 % — ABNORMAL LOW (ref 34.8–46.6)
HEMOGLOBIN: 10.4 g/dL — AB (ref 11.6–15.9)
LYMPH%: 24.9 % (ref 14.0–49.7)
MCH: 27.6 pg (ref 25.1–34.0)
MCHC: 32.1 g/dL (ref 31.5–36.0)
MCV: 85.8 fL (ref 79.5–101.0)
MONO#: 0.2 10*3/uL (ref 0.1–0.9)
MONO%: 6.4 % (ref 0.0–14.0)
NEUT%: 65.9 % (ref 38.4–76.8)
NEUTROS ABS: 2.6 10*3/uL (ref 1.5–6.5)
PLATELETS: 227 10*3/uL (ref 145–400)
RBC: 3.77 10*6/uL (ref 3.70–5.45)
RDW: 14.6 % — ABNORMAL HIGH (ref 11.2–14.5)
WBC: 3.9 10*3/uL (ref 3.9–10.3)
lymph#: 1 10*3/uL (ref 0.9–3.3)

## 2014-10-21 LAB — LACTATE DEHYDROGENASE (CC13): LDH: 238 U/L (ref 125–245)

## 2014-10-26 LAB — IGG, IGA, IGM
IgA: 427 mg/dL — ABNORMAL HIGH (ref 69–380)
IgG (Immunoglobin G), Serum: 1310 mg/dL (ref 690–1700)
IgM, Serum: 123 mg/dL (ref 52–322)

## 2014-10-26 LAB — KAPPA/LAMBDA LIGHT CHAINS
Kappa free light chain: 23.3 mg/dL — ABNORMAL HIGH (ref 0.33–1.94)
Kappa:Lambda Ratio: 3.13 — ABNORMAL HIGH (ref 0.26–1.65)
Lambda Free Lght Chn: 7.45 mg/dL — ABNORMAL HIGH (ref 0.57–2.63)

## 2014-10-26 LAB — BETA 2 MICROGLOBULIN, SERUM: BETA 2 MICROGLOBULIN: 12 mg/L — AB (ref <=2.51)

## 2014-10-28 ENCOUNTER — Encounter: Payer: Self-pay | Admitting: Internal Medicine

## 2014-10-28 ENCOUNTER — Ambulatory Visit (HOSPITAL_BASED_OUTPATIENT_CLINIC_OR_DEPARTMENT_OTHER): Payer: Commercial Managed Care - HMO | Admitting: Internal Medicine

## 2014-10-28 ENCOUNTER — Telehealth: Payer: Self-pay | Admitting: Internal Medicine

## 2014-10-28 VITALS — BP 171/96 | HR 88 | Temp 98.4°F | Resp 17 | Ht 68.0 in | Wt 289.9 lb

## 2014-10-28 DIAGNOSIS — E8809 Other disorders of plasma-protein metabolism, not elsewhere classified: Secondary | ICD-10-CM | POA: Diagnosis not present

## 2014-10-28 DIAGNOSIS — N189 Chronic kidney disease, unspecified: Secondary | ICD-10-CM

## 2014-10-28 DIAGNOSIS — D890 Polyclonal hypergammaglobulinemia: Principal | ICD-10-CM

## 2014-10-28 DIAGNOSIS — D472 Monoclonal gammopathy: Secondary | ICD-10-CM | POA: Diagnosis not present

## 2014-10-28 NOTE — Progress Notes (Signed)
[facility] Telephone:(336) [phone]   Fax:(336) [place] [address] [place] [address]  DIAGNOSIS: Monoclonal gammopathy of undetermined significance  PRIOR THERAPY: None  CURRENT THERAPY: Observation.  INTERVAL HISTORY: Kristen English 59 y.o. female returns to the clinic today for three-month followup visit. The patient is feeling fine today with no specific complaints. She continues to exercise at regular basis and trying to lose around 50 pounds weight before her kidney transplant. She denied having any significant weight loss or night sweats. She has no nausea or vomiting. The patient denied having any aches and pains. She has no chest pain, shortness of breath, cough or hemoptysis.  The patient had repeat myeloma panel performed recently and she is here for evaluation and discussion of her lab results.  MEDICAL HISTORY: Past Medical History  Diagnosis Date  . Hypertension   . Diabetes mellitus without complication   . Gout   . Chronic kidney disease     protein in urine  . Arthritis     ALLERGIES:  is allergic to morphine and related.  MEDICATIONS:  Current Outpatient Prescriptions  Medication Sig Dispense Refill  . ACCU-CHEK FASTCLIX LANCETS MISC     . ACCU-CHEK SMARTVIEW test strip     . amLODipine (NORVASC) 10 MG tablet Take 10 mg by mouth at bedtime.     Marland Kitchen aspirin 81 MG tablet Take 81 mg by mouth at bedtime.     . carvedilol (COREG) 25 MG tablet Take 25 mg by mouth 2 (two) times daily with a meal.     . cholecalciferol (VITAMIN D) 1000 UNITS tablet Take 2,000 Units by mouth every morning.     . colchicine 0.6 MG tablet Take 0.6 mg by mouth every 12 (twelve) hours as needed (gout.).     Marland Kitchen ferrous sulfate 325 (65 FE) MG tablet Take 325 mg by mouth daily with breakfast.    . furosemide (LASIX) 40 MG tablet Take 40 mg by mouth 2 (two) times daily as needed for fluid.    Marland Kitchen glipiZIDE  (GLUCOTROL) 5 MG tablet Take 5 mg by mouth 3 (three) times daily with meals.    . hydrOXYzine (ATARAX/VISTARIL) 25 MG tablet Take 25 mg by mouth at bedtime as needed for itching.     . Liraglutide (VICTOZA) 18 MG/3ML SOPN Inject 1.8 mg into the skin daily at 12 noon.     . lovastatin (MEVACOR) 10 MG tablet Take 10 mg by mouth at bedtime.     Marland Kitchen NOVOFINE 32G X 6 MM MISC     . ZOSTAVAX [id number] UNT/0.65ML injection     . Liraglutide 18 MG/3ML SOPN Inject 1.2 mg into the skin.     No current facility-administered medications for this visit.    SURGICAL HISTORY:  Past Surgical History  Procedure Laterality Date  . Foot surgery Left 2007  . Joint replacement  2009    bilateral knees  . Total knee arthroplasty      bilateral  . Colonoscopy with propofol N/A 05/11/2014    Procedure: COLONOSCOPY WITH PROPOFOL;  Surgeon: Garlan Fair, MD;  Location: [facility];  Service: Endoscopy;  Laterality: N/A;    REVIEW OF SYSTEMS:  Constitutional: negative Eyes: negative Ears, nose, mouth, throat, and face: negative Respiratory: negative Cardiovascular: negative Gastrointestinal: negative Genitourinary:negative Integument/breast: negative Hematologic/lymphatic: negative Musculoskeletal:negative Neurological: negative Behavioral/Psych: negative Endocrine: negative Allergic/Immunologic: negative   PHYSICAL EXAMINATION: General appearance:  alert, cooperative and no distress Head: Normocephalic, without obvious abnormality, atraumatic Neck: no adenopathy, no JVD, supple, symmetrical, trachea midline and thyroid not enlarged, symmetric, no tenderness/mass/nodules Lymph nodes: Cervical, supraclavicular, and axillary nodes normal. Resp: clear to auscultation bilaterally Back: symmetric, no curvature. ROM normal. No CVA tenderness. Cardio: regular rate and rhythm, S1, S2 normal, no murmur, click, rub or gallop GI: soft, non-tender; bowel sounds normal; no masses,  no organomegaly Extremities:  extremities normal, atraumatic, no cyanosis or edema Neurologic: Alert and oriented X 3, normal strength and tone. Normal symmetric reflexes. Normal coordination and gait  ECOG PERFORMANCE STATUS: 1 - Symptomatic but completely ambulatory  Blood pressure 171/96, pulse 88, temperature 98.4 F (36.9 C), temperature source Oral, resp. rate 17, height 5\' 8"  (1.727 m), weight 289 lb 14.4 oz (131.498 kg), SpO2 100 %.  LABORATORY DATA: Lab Results  Component Value Date   WBC 3.9 10/21/2014   HGB 10.4* 10/21/2014   HCT 32.4* 10/21/2014   MCV 85.8 10/21/2014   PLT 227 10/21/2014      Chemistry      Component Value Date/Time   NA 140 10/21/2014 1046   NA 142 05/11/2014 1135   K 3.8 10/21/2014 1046   K 4.2 05/11/2014 1135   CL 102 02/25/2008 0548   CO2 26 10/21/2014 1046   CO2 29 02/25/2008 0548   BUN 35.1* 10/21/2014 1046   BUN 21 02/25/2008 0548   CREATININE 3.3* 10/21/2014 1046   CREATININE 1.32* 02/25/2008 0548      Component Value Date/Time   CALCIUM 9.2 10/21/2014 1046   CALCIUM 9.3 02/25/2008 0548   ALKPHOS 103 10/21/2014 1046   ALKPHOS 97 07/04/2007 2038   AST 27 10/21/2014 1046   AST 21 07/04/2007 2038   ALT 22 10/21/2014 1046   ALT 18 07/04/2007 2038   BILITOT 0.23 10/21/2014 1046   BILITOT 0.3 07/04/2007 2038     Other lab results: Beta-2 microglobulin 12.00, IgG 1310, IgA 427 and IgM 123. Free kappa light chain 23.30, free lambda light chain 7.45 with a kappa/lambda ratio 3.13.  RADIOGRAPHIC STUDIES: No results found. 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  The recent myeloma panel showed mild increased compared to 3 months ago but very similar to her #6 months ago. I discussed the lab results with the patient today. I recommended for her to continue on observation with repeat myeloma panel in 3  months. For the renal insufficiency, she will continue her routine follow-up visit and evaluation by Dr. Moshe Cipro and currently undergoing evaluation at [facility] for kidney transplant. She was advised to call immediately if she has any concerning symptoms in the interval.  The patient voices understanding of current disease status and treatment options and is in agreement with the current care plan.  All questions were answered. The patient knows to call the clinic with any problems, questions or concerns. We can certainly see the patient much sooner if necessary.  Disclaimer: This note was dictated with voice recognition software. Similar sounding words can inadvertently be transcribed and may not be corrected upon review.

## 2014-10-28 NOTE — Telephone Encounter (Signed)
per pof to sch pt sch-gave pt copy of avs

## 2014-11-02 ENCOUNTER — Encounter: Payer: Commercial Managed Care - HMO | Attending: Internal Medicine | Admitting: Dietician

## 2014-11-02 ENCOUNTER — Encounter: Payer: Self-pay | Admitting: Dietician

## 2014-11-02 VITALS — Ht 69.0 in | Wt 292.2 lb

## 2014-11-02 DIAGNOSIS — E119 Type 2 diabetes mellitus without complications: Secondary | ICD-10-CM

## 2014-11-02 DIAGNOSIS — Z713 Dietary counseling and surveillance: Secondary | ICD-10-CM | POA: Insufficient documentation

## 2014-11-02 NOTE — Progress Notes (Signed)
Diabetes Self-Management Education  Visit Type: First/Initial  Appt. Start Time: 1115 Appt. End Time: 1215  11/02/2014  Ms. Kristen English, identified by name and date of birth, is a 59 y.o. female with a diagnosis of Diabetes: Type 2.   ASSESSMENT  Height 5\' 9"  (1.753 m), weight 292 lb 3.2 oz (132.541 kg). Body mass index is 43.13 kg/(m^2).      Diabetes Self-Management Education - 11/02/14 1214    Patient Education   Disease state  Definition of diabetes, type 1 and 2, and the diagnosis of diabetes   Nutrition management  Role of diet in the treatment of diabetes and the relationship between the three main macronutrients and blood glucose level;Reviewed blood glucose goals for pre and post meals and how to evaluate the patients' food intake on their blood glucose level.   Physical activity and exercise  Role of exercise on diabetes management, blood pressure control and cardiac health.   Medications Reviewed patients medication for diabetes, action, purpose, timing of dose and side effects.   Acute complications Taught treatment of hypoglycemia - the 15 rule.;Discussed and identified patients' treatment of hyperglycemia.   Chronic complications Assessed and discussed foot care and prevention of foot problems;Dental care;Reviewed with patient heart disease, higher risk of, and prevention;Retinopathy and reason for yearly dilated eye exams   Psychosocial adjustment Role of stress on diabetes   Individualized Goals (developed by patient)   Nutrition Follow meal plan discussed   Physical Activity Exercise 5-7 days per week;30 minutes per day   Monitoring  test my blood glucose as discussed   Reducing Risk examine blood glucose patterns;do foot checks daily   Outcomes   Expected Outcomes Demonstrated interest in learning. Expect positive outcomes   Future DMSE 3-4 months   Program Status Completed      Individualized Plan for Diabetes Self-Management Training:   Learning  Objective:  Patient will have a greater understanding of diabetes self-management. Patient education plan is to attend individual and/or group sessions per assessed needs and concerns.   Plan:   Patient Instructions  Goals:  Follow Diabetes Meal Plan as instructed  Eat 3 meals and 2 snacks, every 3-5 hrs  Limit carbohydrate intake to quarter of of your plate  Limit carbohydrate intake to 15 grams carbohydrate/snack  Add lean protein foods to meals/snacks  Monitor glucose levels as instructed by your doctor  Aim for at least 30 mins of physical activity daily  Aim to fill half of your plate with vegetables (frozen vegetable or pre washed salad greens)  Cut out sugar sweetened drinks (soda, sweet tea, juice)    Expected Outcomes:  Demonstrated interest in learning. Expect positive outcomes  Education material provided: Living Well with Diabetes, Meal plan card and Snack sheet  If problems or questions, patient to contact team via:  Phone  Future DSME appointment: 3-4 months

## 2014-11-02 NOTE — Patient Instructions (Signed)
Goals:  Follow Diabetes Meal Plan as instructed  Eat 3 meals and 2 snacks, every 3-5 hrs  Limit carbohydrate intake to quarter of of your plate  Limit carbohydrate intake to 15 grams carbohydrate/snack  Add lean protein foods to meals/snacks  Monitor glucose levels as instructed by your doctor  Aim for at least 30 mins of physical activity daily  Aim to fill half of your plate with vegetables (frozen vegetable or pre washed salad greens)  Cut out sugar sweetened drinks (soda, sweet tea, juice)

## 2014-11-26 DIAGNOSIS — N183 Chronic kidney disease, stage 3 (moderate): Secondary | ICD-10-CM | POA: Diagnosis not present

## 2014-12-25 DIAGNOSIS — N183 Chronic kidney disease, stage 3 (moderate): Secondary | ICD-10-CM | POA: Diagnosis not present

## 2015-01-07 DIAGNOSIS — N183 Chronic kidney disease, stage 3 (moderate): Secondary | ICD-10-CM | POA: Diagnosis not present

## 2015-01-07 DIAGNOSIS — E119 Type 2 diabetes mellitus without complications: Secondary | ICD-10-CM | POA: Diagnosis not present

## 2015-01-07 DIAGNOSIS — I10 Essential (primary) hypertension: Secondary | ICD-10-CM | POA: Diagnosis not present

## 2015-01-07 DIAGNOSIS — E785 Hyperlipidemia, unspecified: Secondary | ICD-10-CM | POA: Diagnosis not present

## 2015-01-26 DIAGNOSIS — N2581 Secondary hyperparathyroidism of renal origin: Secondary | ICD-10-CM | POA: Diagnosis not present

## 2015-01-26 DIAGNOSIS — N183 Chronic kidney disease, stage 3 (moderate): Secondary | ICD-10-CM | POA: Diagnosis not present

## 2015-01-28 ENCOUNTER — Telehealth: Payer: Self-pay | Admitting: Internal Medicine

## 2015-01-28 ENCOUNTER — Ambulatory Visit (HOSPITAL_BASED_OUTPATIENT_CLINIC_OR_DEPARTMENT_OTHER): Payer: Commercial Managed Care - HMO | Admitting: Internal Medicine

## 2015-01-28 ENCOUNTER — Encounter: Payer: Self-pay | Admitting: Internal Medicine

## 2015-01-28 ENCOUNTER — Other Ambulatory Visit (HOSPITAL_BASED_OUTPATIENT_CLINIC_OR_DEPARTMENT_OTHER): Payer: Commercial Managed Care - HMO

## 2015-01-28 VITALS — HR 84 | Temp 98.5°F | Resp 18 | Wt 291.2 lb

## 2015-01-28 DIAGNOSIS — D472 Monoclonal gammopathy: Secondary | ICD-10-CM

## 2015-01-28 DIAGNOSIS — N289 Disorder of kidney and ureter, unspecified: Secondary | ICD-10-CM | POA: Diagnosis not present

## 2015-01-28 DIAGNOSIS — E8809 Other disorders of plasma-protein metabolism, not elsewhere classified: Secondary | ICD-10-CM

## 2015-01-28 DIAGNOSIS — D890 Polyclonal hypergammaglobulinemia: Principal | ICD-10-CM

## 2015-01-28 DIAGNOSIS — W108XXA Fall (on) (from) other stairs and steps, initial encounter: Secondary | ICD-10-CM | POA: Diagnosis not present

## 2015-01-28 LAB — CBC WITH DIFFERENTIAL/PLATELET
BASO%: 0.4 % (ref 0.0–2.0)
Basophils Absolute: 0 10*3/uL (ref 0.0–0.1)
EOS%: 1.6 % (ref 0.0–7.0)
Eosinophils Absolute: 0.1 10*3/uL (ref 0.0–0.5)
HCT: 31.9 % — ABNORMAL LOW (ref 34.8–46.6)
HGB: 10.1 g/dL — ABNORMAL LOW (ref 11.6–15.9)
LYMPH%: 22.8 % (ref 14.0–49.7)
MCH: 27.3 pg (ref 25.1–34.0)
MCHC: 31.8 g/dL (ref 31.5–36.0)
MCV: 86.1 fL (ref 79.5–101.0)
MONO#: 0.2 10*3/uL (ref 0.1–0.9)
MONO%: 5.7 % (ref 0.0–14.0)
NEUT%: 69.5 % (ref 38.4–76.8)
NEUTROS ABS: 2.8 10*3/uL (ref 1.5–6.5)
Platelets: 223 10*3/uL (ref 145–400)
RBC: 3.71 10*6/uL (ref 3.70–5.45)
RDW: 14.4 % (ref 11.2–14.5)
WBC: 4.1 10*3/uL (ref 3.9–10.3)
lymph#: 0.9 10*3/uL (ref 0.9–3.3)

## 2015-01-28 LAB — COMPREHENSIVE METABOLIC PANEL (CC13)
ALT: 15 U/L (ref 0–55)
ANION GAP: 7 meq/L (ref 3–11)
AST: 21 U/L (ref 5–34)
Albumin: 2.8 g/dL — ABNORMAL LOW (ref 3.5–5.0)
Alkaline Phosphatase: 102 U/L (ref 40–150)
BUN: 31.9 mg/dL — AB (ref 7.0–26.0)
CO2: 27 mEq/L (ref 22–29)
CREATININE: 3.3 mg/dL — AB (ref 0.6–1.1)
Calcium: 10.4 mg/dL (ref 8.4–10.4)
Chloride: 108 mEq/L (ref 98–109)
EGFR: 16 mL/min/{1.73_m2} — ABNORMAL LOW (ref >90)
GLUCOSE: 100 mg/dL (ref 70–140)
Potassium: 4.3 mEq/L (ref 3.5–5.1)
SODIUM: 142 meq/L (ref 136–145)
TOTAL PROTEIN: 6.9 g/dL (ref 6.4–8.3)
Total Bilirubin: <0.3 mg/dL (ref 0.20–1.20)

## 2015-01-28 LAB — LACTATE DEHYDROGENASE (CC13): LDH: 227 U/L (ref 125–245)

## 2015-01-28 NOTE — Progress Notes (Signed)
[place] [phone]   Fax:(336) [place] [address] [place] [address]  DIAGNOSIS: Monoclonal gammopathy of undetermined significance  PRIOR THERAPY: None  CURRENT THERAPY: Observation.  INTERVAL HISTORY: Kristen English 59 y.o. female returns to the clinic today for three-month followup visit. The patient is feeling fine today with no specific complaints. She denied having any significant weight loss or night sweats. She has no nausea or vomiting. The patient denied having any aches and pains. She has no chest pain, shortness of breath, cough or hemoptysis.  The patient had repeat myeloma panel performed recently and she is here for evaluation and discussion of her lab results.  MEDICAL HISTORY: Past Medical History  Diagnosis Date  . Hypertension   . Diabetes mellitus without complication ([place])   . Gout   . Chronic kidney disease     protein in urine  . Arthritis     ALLERGIES:  is allergic to morphine and related.  MEDICATIONS:  Current Outpatient Prescriptions  Medication Sig Dispense Refill  . ACCU-CHEK FASTCLIX LANCETS MISC     . ACCU-CHEK SMARTVIEW test strip     . amLODipine (NORVASC) 10 MG tablet Take 10 mg by mouth at bedtime.     Marland Kitchen aspirin 81 MG tablet Take 81 mg by mouth at bedtime.     . carvedilol (COREG) 25 MG tablet Take 25 mg by mouth 2 (two) times daily with a meal.     . cholecalciferol (VITAMIN D) 1000 UNITS tablet Take 2,000 Units by mouth every morning.     . colchicine 0.6 MG tablet Take 0.6 mg by mouth every 12 (twelve) hours as needed (gout.).     Marland Kitchen ferrous sulfate 325 (65 FE) MG tablet Take 325 mg by mouth daily with breakfast.    . furosemide (LASIX) 40 MG tablet Take 40 mg by mouth 2 (two) times daily as needed for fluid.    Marland Kitchen glipiZIDE (GLUCOTROL) 5 MG tablet Take 5 mg by mouth 3 (three) times daily with meals.    . hydrOXYzine (ATARAX/VISTARIL)  25 MG tablet Take 25 mg by mouth at bedtime as needed for itching.     . Liraglutide (VICTOZA) 18 MG/3ML SOPN Inject 1.8 mg into the skin daily at 12 noon.     . Liraglutide 18 MG/3ML SOPN Inject 1.2 mg into the skin.    Marland Kitchen lovastatin (MEVACOR) 10 MG tablet Take 10 mg by mouth at bedtime.     Marland Kitchen NOVOFINE 32G X 6 MM MISC     . ZOSTAVAX 91478 UNT/0.65ML injection      No current facility-administered medications for this visit.    SURGICAL HISTORY:  Past Surgical History  Procedure Laterality Date  . Foot surgery Left 2007  . Joint replacement  2009    bilateral knees  . Total knee arthroplasty      bilateral  . Colonoscopy with propofol N/A 05/11/2014    Procedure: COLONOSCOPY WITH PROPOFOL;  Surgeon: Garlan Fair, MD;  Location: [facility];  Service: Endoscopy;  Laterality: N/A;    REVIEW OF SYSTEMS:  A comprehensive review of systems was negative.   PHYSICAL EXAMINATION: General appearance: alert, cooperative and no distress Head: Normocephalic, without obvious abnormality, atraumatic Neck: no adenopathy, no JVD, supple, symmetrical, trachea midline and thyroid not enlarged, symmetric, no tenderness/mass/nodules Lymph nodes: Cervical, supraclavicular, and axillary nodes normal. Resp: clear to auscultation bilaterally Back: symmetric,  no curvature. ROM normal. No CVA tenderness. Cardio: regular rate and rhythm, S1, S2 normal, no murmur, click, rub or gallop GI: soft, non-tender; bowel sounds normal; no masses,  no organomegaly Extremities: extremities normal, atraumatic, no cyanosis or edema Neurologic: Alert and oriented X 3, normal strength and tone. Normal symmetric reflexes. Normal coordination and gait  ECOG PERFORMANCE STATUS: 1 - Symptomatic but completely ambulatory  Pulse 84, temperature 98.5 F (36.9 C), temperature source Oral, resp. rate 18, weight 291 lb 3.2 oz (132.087 kg), SpO2 100 %.  LABORATORY DATA: Lab Results  Component Value Date   WBC 4.1 01/28/2015    HGB 10.1* 01/28/2015   HCT 31.9* 01/28/2015   MCV 86.1 01/28/2015   PLT 223 01/28/2015      Chemistry      Component Value Date/Time   NA 142 01/28/2015 0854   NA 142 05/11/2014 1135   K 4.3 01/28/2015 0854   K 4.2 05/11/2014 1135   CL 102 02/25/2008 0548   CO2 27 01/28/2015 0854   CO2 29 02/25/2008 0548   BUN 31.9* 01/28/2015 0854   BUN 21 02/25/2008 0548   CREATININE 3.3* 01/28/2015 0854   CREATININE 1.32* 02/25/2008 0548      Component Value Date/Time   CALCIUM 10.4 01/28/2015 0854   CALCIUM 9.3 02/25/2008 0548   ALKPHOS 102 01/28/2015 0854   ALKPHOS 97 07/04/2007 2038   AST 21 01/28/2015 0854   AST 21 07/04/2007 2038   ALT 15 01/28/2015 0854   ALT 18 07/04/2007 2038   BILITOT <0.30 01/28/2015 0854   BILITOT 0.3 07/04/2007 2038     Other lab results: Beta-2 microglobulin 12.00, IgG 1270, IgA 447 and IgM 111. Free kappa light chain 20.40, free lambda light chain 6.59 with a kappa/lambda ratio 3.10.  RADIOGRAPHIC STUDIES: No results found. 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  Her myeloma panel showed stable disease. I discussed the lab results with the patient today. I recommended for her to continue on observation with repeat myeloma panel in 3 months after repeating myeloma panel. For the renal insufficiency, she will continue her routine follow-up visit and evaluation by Dr. Moshe Cipro and currently undergoing evaluation at [facility] for kidney transplant. She was advised to call immediately if she has any concerning symptoms in the interval.  The patient voices understanding of current disease status and treatment options and is in agreement with the current care plan.  All questions were answered. The patient knows to call the clinic with any problems, questions or concerns. We can  certainly see the patient much sooner if necessary.  Disclaimer: This note was dictated with voice recognition software. Similar sounding words can inadvertently be transcribed and may not be corrected upon review.

## 2015-01-28 NOTE — Telephone Encounter (Signed)
per pof to sch pt appt-gave pt copy of avs °

## 2015-02-01 ENCOUNTER — Ambulatory Visit: Payer: Commercial Managed Care - HMO | Admitting: Dietician

## 2015-02-01 LAB — IGG, IGA, IGM
IgA: 447 mg/dL — ABNORMAL HIGH (ref 69–380)
IgG (Immunoglobin G), Serum: 1270 mg/dL (ref 690–1700)
IgM, Serum: 111 mg/dL (ref 52–322)

## 2015-02-01 LAB — KAPPA/LAMBDA LIGHT CHAINS
KAPPA FREE LGHT CHN: 20.4 mg/dL — AB (ref 0.33–1.94)
Kappa:Lambda Ratio: 3.1 — ABNORMAL HIGH (ref 0.26–1.65)
LAMBDA FREE LGHT CHN: 6.59 mg/dL — AB (ref 0.57–2.63)

## 2015-02-01 LAB — BETA 2 MICROGLOBULIN, SERUM: Beta-2 Microglobulin: 12 mg/L — ABNORMAL HIGH (ref <=2.51)

## 2015-02-23 DIAGNOSIS — N183 Chronic kidney disease, stage 3 (moderate): Secondary | ICD-10-CM | POA: Diagnosis not present

## 2015-03-02 DIAGNOSIS — E119 Type 2 diabetes mellitus without complications: Secondary | ICD-10-CM | POA: Diagnosis not present

## 2015-03-02 DIAGNOSIS — Z0100 Encounter for examination of eyes and vision without abnormal findings: Secondary | ICD-10-CM | POA: Diagnosis not present

## 2015-03-11 DIAGNOSIS — H52209 Unspecified astigmatism, unspecified eye: Secondary | ICD-10-CM | POA: Diagnosis not present

## 2015-03-11 DIAGNOSIS — H524 Presbyopia: Secondary | ICD-10-CM | POA: Diagnosis not present

## 2015-03-11 DIAGNOSIS — H5203 Hypermetropia, bilateral: Secondary | ICD-10-CM | POA: Diagnosis not present

## 2015-03-11 DIAGNOSIS — Z0100 Encounter for examination of eyes and vision without abnormal findings: Secondary | ICD-10-CM | POA: Diagnosis not present

## 2015-03-23 DIAGNOSIS — N2581 Secondary hyperparathyroidism of renal origin: Secondary | ICD-10-CM | POA: Diagnosis not present

## 2015-03-23 DIAGNOSIS — N183 Chronic kidney disease, stage 3 (moderate): Secondary | ICD-10-CM | POA: Diagnosis not present

## 2015-03-31 ENCOUNTER — Ambulatory Visit
Admission: RE | Admit: 2015-03-31 | Discharge: 2015-03-31 | Disposition: A | Discharge status: Home / Self Care | Payer: Commercial Managed Care - HMO | Source: Ambulatory Visit | Attending: Internal Medicine | Admitting: Internal Medicine

## 2015-03-31 ENCOUNTER — Other Ambulatory Visit: Payer: Self-pay | Admitting: Internal Medicine

## 2015-03-31 DIAGNOSIS — M79641 Pain in right hand: Secondary | ICD-10-CM

## 2015-03-31 DIAGNOSIS — E1122 Type 2 diabetes mellitus with diabetic chronic kidney disease: Secondary | ICD-10-CM | POA: Diagnosis not present

## 2015-03-31 DIAGNOSIS — M109 Gout, unspecified: Secondary | ICD-10-CM | POA: Diagnosis not present

## 2015-03-31 DIAGNOSIS — M7989 Other specified soft tissue disorders: Secondary | ICD-10-CM | POA: Diagnosis not present

## 2015-03-31 DIAGNOSIS — J069 Acute upper respiratory infection, unspecified: Secondary | ICD-10-CM | POA: Diagnosis not present

## 2015-03-31 DIAGNOSIS — E782 Mixed hyperlipidemia: Secondary | ICD-10-CM | POA: Diagnosis not present

## 2015-03-31 DIAGNOSIS — N184 Chronic kidney disease, stage 4 (severe): Secondary | ICD-10-CM | POA: Diagnosis not present

## 2015-03-31 DIAGNOSIS — D472 Monoclonal gammopathy: Secondary | ICD-10-CM | POA: Diagnosis not present

## 2015-03-31 DIAGNOSIS — I10 Essential (primary) hypertension: Secondary | ICD-10-CM | POA: Diagnosis not present

## 2015-04-07 ENCOUNTER — Other Ambulatory Visit: Payer: Self-pay | Admitting: Obstetrics and Gynecology

## 2015-04-07 ENCOUNTER — Other Ambulatory Visit (HOSPITAL_COMMUNITY)
Admission: RE | Admit: 2015-04-07 | Discharge: 2015-04-07 | Disposition: A | Discharge status: Home / Self Care | Payer: Commercial Managed Care - HMO | Source: Ambulatory Visit | Attending: Obstetrics and Gynecology | Admitting: Obstetrics and Gynecology

## 2015-04-07 DIAGNOSIS — Z23 Encounter for immunization: Secondary | ICD-10-CM | POA: Diagnosis not present

## 2015-04-07 DIAGNOSIS — Z01419 Encounter for gynecological examination (general) (routine) without abnormal findings: Secondary | ICD-10-CM | POA: Insufficient documentation

## 2015-04-09 LAB — CYTOLOGY - PAP

## 2015-04-21 DIAGNOSIS — N183 Chronic kidney disease, stage 3 (moderate): Secondary | ICD-10-CM | POA: Diagnosis not present

## 2015-04-26 ENCOUNTER — Other Ambulatory Visit (HOSPITAL_BASED_OUTPATIENT_CLINIC_OR_DEPARTMENT_OTHER): Payer: Commercial Managed Care - HMO

## 2015-04-26 DIAGNOSIS — D472 Monoclonal gammopathy: Secondary | ICD-10-CM

## 2015-04-26 DIAGNOSIS — Z1231 Encounter for screening mammogram for malignant neoplasm of breast: Secondary | ICD-10-CM | POA: Diagnosis not present

## 2015-04-26 DIAGNOSIS — E8809 Other disorders of plasma-protein metabolism, not elsewhere classified: Secondary | ICD-10-CM | POA: Diagnosis not present

## 2015-04-26 DIAGNOSIS — D890 Polyclonal hypergammaglobulinemia: Principal | ICD-10-CM

## 2015-04-26 LAB — CBC WITH DIFFERENTIAL/PLATELET
BASO%: 0.4 % (ref 0.0–2.0)
BASOS ABS: 0 10*3/uL (ref 0.0–0.1)
EOS%: 2.7 % (ref 0.0–7.0)
Eosinophils Absolute: 0.1 10*3/uL (ref 0.0–0.5)
HEMATOCRIT: 30.6 % — AB (ref 34.8–46.6)
HGB: 9.5 g/dL — ABNORMAL LOW (ref 11.6–15.9)
LYMPH#: 1.1 10*3/uL (ref 0.9–3.3)
LYMPH%: 28.7 % (ref 14.0–49.7)
MCH: 26.6 pg (ref 25.1–34.0)
MCHC: 31.1 g/dL — AB (ref 31.5–36.0)
MCV: 85.8 fL (ref 79.5–101.0)
MONO#: 0.3 10*3/uL (ref 0.1–0.9)
MONO%: 8 % (ref 0.0–14.0)
NEUT#: 2.3 10*3/uL (ref 1.5–6.5)
NEUT%: 60.2 % (ref 38.4–76.8)
Platelets: 204 10*3/uL (ref 145–400)
RBC: 3.57 10*6/uL — AB (ref 3.70–5.45)
RDW: 14.9 % — ABNORMAL HIGH (ref 11.2–14.5)
WBC: 3.8 10*3/uL — ABNORMAL LOW (ref 3.9–10.3)

## 2015-04-26 LAB — COMPREHENSIVE METABOLIC PANEL
ALT: 13 U/L (ref 0–55)
AST: 18 U/L (ref 5–34)
Albumin: 2.9 g/dL — ABNORMAL LOW (ref 3.5–5.0)
Alkaline Phosphatase: 86 U/L (ref 40–150)
Anion Gap: 10 mEq/L (ref 3–11)
BILIRUBIN TOTAL: 0.33 mg/dL (ref 0.20–1.20)
BUN: 36.7 mg/dL — AB (ref 7.0–26.0)
CHLORIDE: 111 meq/L — AB (ref 98–109)
CO2: 22 meq/L (ref 22–29)
CREATININE: 3.5 mg/dL — AB (ref 0.6–1.1)
Calcium: 9.5 mg/dL (ref 8.4–10.4)
EGFR: 15 mL/min/{1.73_m2} — ABNORMAL LOW (ref >90)
Glucose: 91 mg/dl (ref 70–140)
Potassium: 4 mEq/L (ref 3.5–5.1)
SODIUM: 143 meq/L (ref 136–145)
TOTAL PROTEIN: 6.7 g/dL (ref 6.4–8.3)

## 2015-04-26 LAB — LACTATE DEHYDROGENASE: LDH: 207 U/L (ref 125–245)

## 2015-04-27 LAB — IGG, IGA, IGM
IgA, Qn, Serum: 355 mg/dL — ABNORMAL HIGH (ref 87–352)
IgG, Qn, Serum: 878 mg/dL (ref 700–1600)
IgM, Qn, Serum: 94 mg/dL (ref 26–217)

## 2015-04-27 LAB — BETA 2 MICROGLOBULIN, SERUM: BETA 2: 9.4 mg/L — AB (ref 0.6–2.4)

## 2015-04-27 LAB — KAPPA/LAMBDA LIGHT CHAINS
IG KAPPA FREE LIGHT CHAIN: 166.39 mg/L — AB (ref 3.30–19.40)
IG LAMBDA FREE LIGHT CHAIN: 67.72 mg/L — AB (ref 5.71–26.30)
Kappa/Lambda FluidC Ratio: 2.46 — ABNORMAL HIGH (ref 0.26–1.65)

## 2015-05-03 ENCOUNTER — Telehealth: Payer: Self-pay | Admitting: Internal Medicine

## 2015-05-03 ENCOUNTER — Ambulatory Visit (HOSPITAL_BASED_OUTPATIENT_CLINIC_OR_DEPARTMENT_OTHER): Payer: Commercial Managed Care - HMO | Admitting: Internal Medicine

## 2015-05-03 ENCOUNTER — Encounter: Payer: Self-pay | Admitting: Internal Medicine

## 2015-05-03 VITALS — BP 169/94 | HR 95 | Temp 98.4°F | Resp 18 | Ht 69.0 in | Wt 288.3 lb

## 2015-05-03 DIAGNOSIS — N289 Disorder of kidney and ureter, unspecified: Secondary | ICD-10-CM | POA: Diagnosis not present

## 2015-05-03 DIAGNOSIS — D472 Monoclonal gammopathy: Secondary | ICD-10-CM | POA: Diagnosis not present

## 2015-05-03 DIAGNOSIS — E8809 Other disorders of plasma-protein metabolism, not elsewhere classified: Secondary | ICD-10-CM | POA: Diagnosis not present

## 2015-05-03 DIAGNOSIS — D890 Polyclonal hypergammaglobulinemia: Principal | ICD-10-CM

## 2015-05-03 NOTE — Progress Notes (Signed)
[place] Telephone:([phone]   Fax:(336) [place] [address] [place] [address]  DIAGNOSIS: Monoclonal gammopathy of undetermined significance  PRIOR THERAPY: None  CURRENT THERAPY: Observation.  INTERVAL HISTORY: Kristen English 60 y.o. female returns to the clinic today for three-month followup visit. The patient is feeling fine today with no specific complaints. She is still on the list for the renal transplant and has another evaluation soon. She denied having any significant weight loss or night sweats. She has no nausea or vomiting. The patient denied having any aches and pains. She has no chest pain, shortness of breath, cough or hemoptysis.  The patient had repeat myeloma panel performed recently and she is here for evaluation and discussion of her lab results.  MEDICAL HISTORY: Past Medical History  Diagnosis Date  . Hypertension   . Diabetes mellitus without complication ([place])   . Gout   . Chronic kidney disease     protein in urine  . Arthritis     ALLERGIES:  is allergic to morphine and related.  MEDICATIONS:  Current Outpatient Prescriptions  Medication Sig Dispense Refill  . ACCU-CHEK FASTCLIX LANCETS MISC     . ACCU-CHEK SMARTVIEW test strip     . amLODipine (NORVASC) 10 MG tablet Take 10 mg by mouth at bedtime.     Marland Kitchen aspirin 81 MG tablet Take 81 mg by mouth at bedtime.     . carvedilol (COREG) 25 MG tablet Take 25 mg by mouth 2 (two) times daily with a meal.     . cholecalciferol (VITAMIN D) 1000 UNITS tablet Take 2,000 Units by mouth every morning.     . colchicine 0.6 MG tablet Take 0.6 mg by mouth every 12 (twelve) hours as needed (gout.).     Marland Kitchen ferrous sulfate 325 (65 FE) MG tablet Take 325 mg by mouth daily with breakfast.    . furosemide (LASIX) 40 MG tablet Take 40 mg by mouth 2 (two) times daily as needed for fluid.    Marland Kitchen glipiZIDE (GLUCOTROL) 5 MG tablet Take 5  mg by mouth 3 (three) times daily with meals.    . hydrOXYzine (ATARAX/VISTARIL) 25 MG tablet Take 25 mg by mouth at bedtime as needed for itching.     . Liraglutide (VICTOZA) 18 MG/3ML SOPN Inject 1.8 mg into the skin daily at 12 noon.     . Liraglutide 18 MG/3ML SOPN Inject 1.2 mg into the skin.    Marland Kitchen lovastatin (MEVACOR) 10 MG tablet Take 10 mg by mouth at bedtime.     Marland Kitchen NOVOFINE 32G X 6 MM MISC     . ZOSTAVAX 29562 UNT/0.65ML injection      No current facility-administered medications for this visit.    SURGICAL HISTORY:  Past Surgical History  Procedure Laterality Date  . Foot surgery Left 2007  . Joint replacement  2009    bilateral knees  . Total knee arthroplasty      bilateral  . Colonoscopy with propofol N/A 05/11/2014    Procedure: COLONOSCOPY WITH PROPOFOL;  Surgeon: Garlan Fair, MD;  Location: [facility];  Service: Endoscopy;  Laterality: N/A;    REVIEW OF SYSTEMS:  A comprehensive review of systems was negative.   PHYSICAL EXAMINATION: General appearance: alert, cooperative and no distress Head: Normocephalic, without obvious abnormality, atraumatic Neck: no adenopathy, no JVD, supple, symmetrical, trachea midline and thyroid not enlarged, symmetric, no tenderness/mass/nodules  Lymph nodes: Cervical, supraclavicular, and axillary nodes normal. Resp: clear to auscultation bilaterally Back: symmetric, no curvature. ROM normal. No CVA tenderness. Cardio: regular rate and rhythm, S1, S2 normal, no murmur, click, rub or gallop GI: soft, non-tender; bowel sounds normal; no masses,  no organomegaly Extremities: extremities normal, atraumatic, no cyanosis or edema Neurologic: Alert and oriented X 3, normal strength and tone. Normal symmetric reflexes. Normal coordination and gait  ECOG PERFORMANCE STATUS: 1 - Symptomatic but completely ambulatory  Blood pressure 169/94, pulse 95, temperature 98.4 F (36.9 C), temperature source Oral, resp. rate 18, height 5\' 9"  (1.753  m), weight 288 lb 4.8 oz (130.772 kg), SpO2 100 %.  LABORATORY DATA: Lab Results  Component Value Date   WBC 3.8* 04/26/2015   HGB 9.5* 04/26/2015   HCT 30.6* 04/26/2015   MCV 85.8 04/26/2015   PLT 204 04/26/2015      Chemistry      Component Value Date/Time   NA 143 04/26/2015 1345   NA 142 05/11/2014 1135   K 4.0 04/26/2015 1345   K 4.2 05/11/2014 1135   CL 102 02/25/2008 0548   CO2 22 04/26/2015 1345   CO2 29 02/25/2008 0548   BUN 36.7* 04/26/2015 1345   BUN 21 02/25/2008 0548   CREATININE 3.5* 04/26/2015 1345   CREATININE 1.32* 02/25/2008 0548      Component Value Date/Time   CALCIUM 9.5 04/26/2015 1345   CALCIUM 9.3 02/25/2008 0548   ALKPHOS 86 04/26/2015 1345   ALKPHOS 97 07/04/2007 2038   AST 18 04/26/2015 1345   AST 21 07/04/2007 2038   ALT 13 04/26/2015 1345   ALT 18 07/04/2007 2038   BILITOT 0.33 04/26/2015 1345   BILITOT 0.3 07/04/2007 2038     Other lab results: Beta-2 microglobulin 9.4, IgG 878, IgA 355 and IgM 94. Free kappa light chain 166.39, free lambda light chain 67.72 with a kappa/lambda ratio 2.46.  RADIOGRAPHIC STUDIES: No results found. 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  Her myeloma panel performed recently showed stable disease. I discussed the lab results with the patient today. I recommended for her to continue on observation with repeat myeloma panel in 3 months after repeating myeloma panel. For the renal insufficiency, she will continue her routine follow-up visit and evaluation by Kristen English and currently undergoing evaluation at [facility] for kidney transplant. She was advised to call immediately if she has any concerning symptoms in the interval.  The patient voices understanding of current disease status and treatment options and is in agreement with  the current care plan.  All questions were answered. The patient knows to call the clinic with any problems, questions or concerns. We can certainly see the patient much sooner if necessary.  Disclaimer: This note was dictated with voice recognition software. Similar sounding words can inadvertently be transcribed and may not be corrected upon review.

## 2015-05-03 NOTE — Telephone Encounter (Signed)
per Dr Julien Nordmann to sch pt appt-gave pt copy of avs

## 2015-05-05 DIAGNOSIS — N183 Chronic kidney disease, stage 3 (moderate): Secondary | ICD-10-CM | POA: Diagnosis not present

## 2015-05-05 DIAGNOSIS — E785 Hyperlipidemia, unspecified: Secondary | ICD-10-CM | POA: Diagnosis not present

## 2015-05-05 DIAGNOSIS — M109 Gout, unspecified: Secondary | ICD-10-CM | POA: Diagnosis not present

## 2015-05-05 DIAGNOSIS — E119 Type 2 diabetes mellitus without complications: Secondary | ICD-10-CM | POA: Diagnosis not present

## 2015-05-05 DIAGNOSIS — I10 Essential (primary) hypertension: Secondary | ICD-10-CM | POA: Diagnosis not present

## 2015-05-19 DIAGNOSIS — N183 Chronic kidney disease, stage 3 (moderate): Secondary | ICD-10-CM | POA: Diagnosis not present

## 2015-05-19 DIAGNOSIS — N2581 Secondary hyperparathyroidism of renal origin: Secondary | ICD-10-CM | POA: Diagnosis not present

## 2015-06-23 DIAGNOSIS — N183 Chronic kidney disease, stage 3 (moderate): Secondary | ICD-10-CM | POA: Diagnosis not present

## 2015-07-19 DIAGNOSIS — R079 Chest pain, unspecified: Secondary | ICD-10-CM | POA: Diagnosis not present

## 2015-07-19 DIAGNOSIS — Z01818 Encounter for other preprocedural examination: Secondary | ICD-10-CM | POA: Diagnosis not present

## 2015-07-19 DIAGNOSIS — N184 Chronic kidney disease, stage 4 (severe): Secondary | ICD-10-CM | POA: Diagnosis not present

## 2015-07-19 DIAGNOSIS — N189 Chronic kidney disease, unspecified: Secondary | ICD-10-CM | POA: Diagnosis not present

## 2015-07-19 DIAGNOSIS — I129 Hypertensive chronic kidney disease with stage 1 through stage 4 chronic kidney disease, or unspecified chronic kidney disease: Secondary | ICD-10-CM | POA: Diagnosis not present

## 2015-07-19 DIAGNOSIS — E1122 Type 2 diabetes mellitus with diabetic chronic kidney disease: Secondary | ICD-10-CM | POA: Diagnosis not present

## 2015-07-29 ENCOUNTER — Other Ambulatory Visit: Payer: Self-pay | Admitting: Medical Oncology

## 2015-07-29 DIAGNOSIS — D890 Polyclonal hypergammaglobulinemia: Principal | ICD-10-CM

## 2015-07-29 DIAGNOSIS — D472 Monoclonal gammopathy: Secondary | ICD-10-CM

## 2015-07-30 ENCOUNTER — Other Ambulatory Visit (HOSPITAL_BASED_OUTPATIENT_CLINIC_OR_DEPARTMENT_OTHER): Payer: Commercial Managed Care - HMO

## 2015-07-30 ENCOUNTER — Telehealth: Payer: Self-pay | Admitting: Medical Oncology

## 2015-07-30 DIAGNOSIS — D472 Monoclonal gammopathy: Secondary | ICD-10-CM | POA: Diagnosis not present

## 2015-07-30 DIAGNOSIS — D890 Polyclonal hypergammaglobulinemia: Principal | ICD-10-CM

## 2015-07-30 LAB — COMPREHENSIVE METABOLIC PANEL
ALT: 18 U/L (ref 0–55)
ANION GAP: 8 meq/L (ref 3–11)
AST: 21 U/L (ref 5–34)
Albumin: 2.8 g/dL — ABNORMAL LOW (ref 3.5–5.0)
Alkaline Phosphatase: 97 U/L (ref 40–150)
BUN: 34.5 mg/dL — ABNORMAL HIGH (ref 7.0–26.0)
CO2: 24 meq/L (ref 22–29)
Calcium: 9.7 mg/dL (ref 8.4–10.4)
Chloride: 110 mEq/L — ABNORMAL HIGH (ref 98–109)
Creatinine: 3.6 mg/dL (ref 0.6–1.1)
EGFR: 15 mL/min/{1.73_m2} — AB (ref >90)
Glucose: 103 mg/dl (ref 70–140)
Potassium: 4.3 mEq/L (ref 3.5–5.1)
Sodium: 142 mEq/L (ref 136–145)
TOTAL PROTEIN: 6.8 g/dL (ref 6.4–8.3)

## 2015-07-30 LAB — CBC WITH DIFFERENTIAL/PLATELET
BASO%: 0.4 % (ref 0.0–2.0)
BASOS ABS: 0 10*3/uL (ref 0.0–0.1)
EOS%: 4.2 % (ref 0.0–7.0)
Eosinophils Absolute: 0.2 10*3/uL (ref 0.0–0.5)
HCT: 32.9 % — ABNORMAL LOW (ref 34.8–46.6)
HGB: 10.2 g/dL — ABNORMAL LOW (ref 11.6–15.9)
LYMPH%: 26.7 % (ref 14.0–49.7)
MCH: 27.4 pg (ref 25.1–34.0)
MCHC: 31 g/dL — AB (ref 31.5–36.0)
MCV: 88.4 fL (ref 79.5–101.0)
MONO#: 0.2 10*3/uL (ref 0.1–0.9)
MONO%: 5.1 % (ref 0.0–14.0)
NEUT#: 2.9 10*3/uL (ref 1.5–6.5)
NEUT%: 63.6 % (ref 38.4–76.8)
Platelets: 200 10*3/uL (ref 145–400)
RBC: 3.72 10*6/uL (ref 3.70–5.45)
RDW: 14.2 % (ref 11.2–14.5)
WBC: 4.5 10*3/uL (ref 3.9–10.3)
lymph#: 1.2 10*3/uL (ref 0.9–3.3)

## 2015-07-30 NOTE — Telephone Encounter (Signed)
Faxed CMP lab results to Dr Moshe Cipro.

## 2015-08-05 ENCOUNTER — Other Ambulatory Visit: Payer: Self-pay | Admitting: Medical Oncology

## 2015-08-05 ENCOUNTER — Encounter: Payer: Self-pay | Admitting: Internal Medicine

## 2015-08-05 ENCOUNTER — Other Ambulatory Visit: Payer: Commercial Managed Care - HMO

## 2015-08-05 ENCOUNTER — Telehealth: Payer: Self-pay | Admitting: Internal Medicine

## 2015-08-05 ENCOUNTER — Ambulatory Visit (HOSPITAL_BASED_OUTPATIENT_CLINIC_OR_DEPARTMENT_OTHER): Payer: Commercial Managed Care - HMO | Admitting: Internal Medicine

## 2015-08-05 ENCOUNTER — Ambulatory Visit (HOSPITAL_BASED_OUTPATIENT_CLINIC_OR_DEPARTMENT_OTHER): Payer: Commercial Managed Care - HMO

## 2015-08-05 VITALS — BP 170/91 | HR 85 | Temp 98.5°F | Resp 18 | Ht 69.0 in | Wt 284.1 lb

## 2015-08-05 DIAGNOSIS — E8809 Other disorders of plasma-protein metabolism, not elsewhere classified: Secondary | ICD-10-CM

## 2015-08-05 DIAGNOSIS — N289 Disorder of kidney and ureter, unspecified: Secondary | ICD-10-CM | POA: Diagnosis not present

## 2015-08-05 DIAGNOSIS — D472 Monoclonal gammopathy: Secondary | ICD-10-CM

## 2015-08-05 DIAGNOSIS — I10 Essential (primary) hypertension: Secondary | ICD-10-CM | POA: Diagnosis not present

## 2015-08-05 NOTE — Progress Notes (Signed)
[place] Telephone:([phone]   Fax:(336) [place] [address] [place] [address]  DIAGNOSIS: Monoclonal gammopathy of undetermined significance  PRIOR THERAPY: None  CURRENT THERAPY: Observation.  INTERVAL HISTORY: Kristen English 60 y.o. female returns to the clinic today for three-month followup visit. The patient is feeling fine today with no specific complaints. She is still on the list for the renal transplant and has been running a lot of testing recently. She denied having any significant weight loss or night sweats. She has no nausea or vomiting. The patient denied having any aches and pains. She has no chest pain, shortness of breath, cough or hemoptysis.  The patient had repeat CBC and comprehensive metabolic panel performed recently and she is here for evaluation and discussion of her lab results.  MEDICAL HISTORY: Past Medical History  Diagnosis Date  . Hypertension   . Diabetes mellitus without complication ([place])   . Gout   . Chronic kidney disease     protein in urine  . Arthritis     ALLERGIES:  is allergic to morphine and related.  MEDICATIONS:  Current Outpatient Prescriptions  Medication Sig Dispense Refill  . ACCU-CHEK FASTCLIX LANCETS MISC     . ACCU-CHEK SMARTVIEW test strip     . amLODipine (NORVASC) 10 MG tablet Take 10 mg by mouth at bedtime.     Marland Kitchen aspirin 81 MG tablet Take 81 mg by mouth at bedtime.     . carvedilol (COREG) 25 MG tablet Take 25 mg by mouth 2 (two) times daily with a meal.     . cholecalciferol (VITAMIN D) 1000 UNITS tablet Take 2,000 Units by mouth every morning.     . colchicine 0.6 MG tablet Take 0.6 mg by mouth every 12 (twelve) hours as needed (gout.).     Marland Kitchen ferrous sulfate 325 (65 FE) MG tablet Take 325 mg by mouth daily with breakfast.    . furosemide (LASIX) 40 MG tablet Take 40 mg by mouth 2 (two) times daily as needed for fluid.    Marland Kitchen  glipiZIDE (GLUCOTROL) 5 MG tablet Take 5 mg by mouth 3 (three) times daily with meals.    . hydrOXYzine (ATARAX/VISTARIL) 25 MG tablet Take 25 mg by mouth at bedtime as needed for itching.     . Liraglutide (VICTOZA) 18 MG/3ML SOPN Inject 1.8 mg into the skin daily at 12 noon.     . Liraglutide 18 MG/3ML SOPN Inject 1.2 mg into the skin.    Marland Kitchen lovastatin (MEVACOR) 10 MG tablet Take 10 mg by mouth at bedtime.     Marland Kitchen NOVOFINE 32G X 6 MM MISC     . ZOSTAVAX [id number] UNT/0.65ML injection      No current facility-administered medications for this visit.    SURGICAL HISTORY:  Past Surgical History  Procedure Laterality Date  . Foot surgery Left 2007  . Joint replacement  2009    bilateral knees  . Total knee arthroplasty      bilateral  . Colonoscopy with propofol N/A 05/11/2014    Procedure: COLONOSCOPY WITH PROPOFOL;  Surgeon: Garlan Fair, MD;  Location: [facility];  Service: Endoscopy;  Laterality: N/A;    REVIEW OF SYSTEMS:  A comprehensive review of systems was negative.   PHYSICAL EXAMINATION: General appearance: alert, cooperative and no distress Head: Normocephalic, without obvious abnormality, atraumatic Neck: no adenopathy, no JVD, supple, symmetrical, trachea midline  and thyroid not enlarged, symmetric, no tenderness/mass/nodules Lymph nodes: Cervical, supraclavicular, and axillary nodes normal. Resp: clear to auscultation bilaterally Back: symmetric, no curvature. ROM normal. No CVA tenderness. Cardio: regular rate and rhythm, S1, S2 normal, no murmur, click, rub or gallop GI: soft, non-tender; bowel sounds normal; no masses,  no organomegaly Extremities: extremities normal, atraumatic, no cyanosis or edema Neurologic: Alert and oriented X 3, normal strength and tone. Normal symmetric reflexes. Normal coordination and gait  ECOG PERFORMANCE STATUS: 1 - Symptomatic but completely ambulatory  There were no vitals taken for this visit.  LABORATORY DATA: Lab Results    Component Value Date   WBC 4.5 07/30/2015   HGB 10.2* 07/30/2015   HCT 32.9* 07/30/2015   MCV 88.4 07/30/2015   PLT 200 07/30/2015      Chemistry      Component Value Date/Time   NA 142 07/30/2015 0858   NA 142 05/11/2014 1135   K 4.3 07/30/2015 0858   K 4.2 05/11/2014 1135   CL 102 02/25/2008 0548   CO2 24 07/30/2015 0858   CO2 29 02/25/2008 0548   BUN 34.5* 07/30/2015 0858   BUN 21 02/25/2008 0548   CREATININE 3.6* 07/30/2015 0858   CREATININE 1.32* 02/25/2008 0548      Component Value Date/Time   CALCIUM 9.7 07/30/2015 0858   CALCIUM 9.3 02/25/2008 0548   ALKPHOS 97 07/30/2015 0858   ALKPHOS 97 07/04/2007 2038   AST 21 07/30/2015 0858   AST 21 07/04/2007 2038   ALT 18 07/30/2015 0858   ALT 18 07/04/2007 2038   BILITOT <0.30 07/30/2015 0858   BILITOT 0.3 07/04/2007 2038     Other lab results: Beta-2 microglobulin 9.4, IgG 878, IgA 355 and IgM 94. Free kappa light chain 166.39, free lambda light chain 67.72 with a kappa/lambda ratio 2.46.  RADIOGRAPHIC STUDIES: No results found. 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  Her CBC and comprehensive metabolic panel are stable from the last visit. I will repeat myeloma panel today. I discussed the lab results with the patient today. If there is no evidence of disease progression on the myeloma panel today, I would see her back for follow-up visit in 3 months with repeat blood work including myeloma panel. For the renal insufficiency, she will continue her routine follow-up visit and evaluation by Dr. Moshe Cipro and currently undergoing evaluation at [facility] for kidney transplant. For hypertension, strongly encouraged the patient to take her blood pressure medication as ordered and to consult with her primary care physician for any adjustment of her  medication. She was advised to call immediately if she has any concerning symptoms in the interval.  The patient voices understanding of current disease status and treatment options and is in agreement with the current care plan.  All questions were answered. The patient knows to call the clinic with any problems, questions or concerns. We can certainly see the patient much sooner if necessary.  Disclaimer: This note was dictated with voice recognition software. Similar sounding words can inadvertently be transcribed and may not be corrected upon review.

## 2015-08-05 NOTE — Telephone Encounter (Signed)
Gave and printed appt sched and avs for pt Aug  °

## 2015-08-06 LAB — IGG, IGA, IGM
IGA/IMMUNOGLOBULIN A, SERUM: 400 mg/dL — AB (ref 87–352)
IGM (IMMUNOGLOBIN M), SRM: 110 mg/dL (ref 26–217)
IgG, Qn, Serum: 1067 mg/dL (ref 700–1600)

## 2015-08-06 LAB — BETA 2 MICROGLOBULIN, SERUM: BETA 2: 10.2 mg/L — AB (ref 0.6–2.4)

## 2015-08-06 LAB — KAPPA/LAMBDA LIGHT CHAINS
IG KAPPA FREE LIGHT CHAIN: 202.97 mg/L — AB (ref 3.30–19.40)
IG LAMBDA FREE LIGHT CHAIN: 85.78 mg/L — AB (ref 5.71–26.30)
KAPPA/LAMBDA FLC RATIO: 2.37 — AB (ref 0.26–1.65)

## 2015-08-09 DIAGNOSIS — I129 Hypertensive chronic kidney disease with stage 1 through stage 4 chronic kidney disease, or unspecified chronic kidney disease: Secondary | ICD-10-CM | POA: Diagnosis not present

## 2015-08-09 DIAGNOSIS — E1122 Type 2 diabetes mellitus with diabetic chronic kidney disease: Secondary | ICD-10-CM | POA: Diagnosis not present

## 2015-08-09 DIAGNOSIS — N184 Chronic kidney disease, stage 4 (severe): Secondary | ICD-10-CM | POA: Diagnosis not present

## 2015-08-09 DIAGNOSIS — Z01810 Encounter for preprocedural cardiovascular examination: Secondary | ICD-10-CM | POA: Diagnosis not present

## 2015-08-09 DIAGNOSIS — I517 Cardiomegaly: Secondary | ICD-10-CM | POA: Diagnosis not present

## 2015-09-09 DIAGNOSIS — M109 Gout, unspecified: Secondary | ICD-10-CM | POA: Diagnosis not present

## 2015-09-09 DIAGNOSIS — E669 Obesity, unspecified: Secondary | ICD-10-CM | POA: Diagnosis not present

## 2015-09-09 DIAGNOSIS — D631 Anemia in chronic kidney disease: Secondary | ICD-10-CM | POA: Diagnosis not present

## 2015-09-09 DIAGNOSIS — I10 Essential (primary) hypertension: Secondary | ICD-10-CM | POA: Diagnosis not present

## 2015-09-09 DIAGNOSIS — N183 Chronic kidney disease, stage 3 (moderate): Secondary | ICD-10-CM | POA: Diagnosis not present

## 2015-09-09 DIAGNOSIS — E785 Hyperlipidemia, unspecified: Secondary | ICD-10-CM | POA: Diagnosis not present

## 2015-09-09 DIAGNOSIS — J069 Acute upper respiratory infection, unspecified: Secondary | ICD-10-CM | POA: Diagnosis not present

## 2015-09-09 DIAGNOSIS — E119 Type 2 diabetes mellitus without complications: Secondary | ICD-10-CM | POA: Diagnosis not present

## 2015-09-27 DIAGNOSIS — N2581 Secondary hyperparathyroidism of renal origin: Secondary | ICD-10-CM | POA: Diagnosis not present

## 2015-09-27 DIAGNOSIS — N183 Chronic kidney disease, stage 3 (moderate): Secondary | ICD-10-CM | POA: Diagnosis not present

## 2015-09-30 DIAGNOSIS — E782 Mixed hyperlipidemia: Secondary | ICD-10-CM | POA: Diagnosis not present

## 2015-09-30 DIAGNOSIS — N184 Chronic kidney disease, stage 4 (severe): Secondary | ICD-10-CM | POA: Diagnosis not present

## 2015-09-30 DIAGNOSIS — M109 Gout, unspecified: Secondary | ICD-10-CM | POA: Diagnosis not present

## 2015-09-30 DIAGNOSIS — Z0000 Encounter for general adult medical examination without abnormal findings: Secondary | ICD-10-CM | POA: Diagnosis not present

## 2015-09-30 DIAGNOSIS — D472 Monoclonal gammopathy: Secondary | ICD-10-CM | POA: Diagnosis not present

## 2015-09-30 DIAGNOSIS — I10 Essential (primary) hypertension: Secondary | ICD-10-CM | POA: Diagnosis not present

## 2015-09-30 DIAGNOSIS — E1122 Type 2 diabetes mellitus with diabetic chronic kidney disease: Secondary | ICD-10-CM | POA: Diagnosis not present

## 2015-09-30 DIAGNOSIS — D649 Anemia, unspecified: Secondary | ICD-10-CM | POA: Diagnosis not present

## 2015-10-26 DIAGNOSIS — N183 Chronic kidney disease, stage 3 (moderate): Secondary | ICD-10-CM | POA: Diagnosis not present

## 2015-11-04 ENCOUNTER — Telehealth: Payer: Self-pay | Admitting: Internal Medicine

## 2015-11-04 ENCOUNTER — Other Ambulatory Visit (HOSPITAL_BASED_OUTPATIENT_CLINIC_OR_DEPARTMENT_OTHER): Payer: Commercial Managed Care - HMO

## 2015-11-04 ENCOUNTER — Encounter: Payer: Self-pay | Admitting: Internal Medicine

## 2015-11-04 ENCOUNTER — Ambulatory Visit (HOSPITAL_BASED_OUTPATIENT_CLINIC_OR_DEPARTMENT_OTHER): Payer: Commercial Managed Care - HMO | Admitting: Internal Medicine

## 2015-11-04 VITALS — BP 180/100 | HR 96 | Temp 99.1°F | Resp 19 | Ht 69.0 in | Wt 293.6 lb

## 2015-11-04 DIAGNOSIS — D472 Monoclonal gammopathy: Secondary | ICD-10-CM

## 2015-11-04 DIAGNOSIS — E8809 Other disorders of plasma-protein metabolism, not elsewhere classified: Secondary | ICD-10-CM | POA: Diagnosis not present

## 2015-11-04 DIAGNOSIS — I10 Essential (primary) hypertension: Secondary | ICD-10-CM | POA: Diagnosis not present

## 2015-11-04 DIAGNOSIS — N289 Disorder of kidney and ureter, unspecified: Secondary | ICD-10-CM

## 2015-11-04 LAB — CBC WITH DIFFERENTIAL/PLATELET
BASO%: 0.2 % (ref 0.0–2.0)
Basophils Absolute: 0 10*3/uL (ref 0.0–0.1)
EOS%: 0.2 % (ref 0.0–7.0)
Eosinophils Absolute: 0 10*3/uL (ref 0.0–0.5)
HCT: 31.2 % — ABNORMAL LOW (ref 34.8–46.6)
HGB: 9.9 g/dL — ABNORMAL LOW (ref 11.6–15.9)
LYMPH%: 9.6 % — AB (ref 14.0–49.7)
MCH: 27.1 pg (ref 25.1–34.0)
MCHC: 31.7 g/dL (ref 31.5–36.0)
MCV: 85.4 fL (ref 79.5–101.0)
MONO#: 0.4 10*3/uL (ref 0.1–0.9)
MONO%: 5.6 % (ref 0.0–14.0)
NEUT%: 84.4 % — ABNORMAL HIGH (ref 38.4–76.8)
NEUTROS ABS: 6.6 10*3/uL — AB (ref 1.5–6.5)
Platelets: 179 10*3/uL (ref 145–400)
RBC: 3.66 10*6/uL — AB (ref 3.70–5.45)
RDW: 14.6 % — AB (ref 11.2–14.5)
WBC: 7.8 10*3/uL (ref 3.9–10.3)
lymph#: 0.7 10*3/uL — ABNORMAL LOW (ref 0.9–3.3)

## 2015-11-04 LAB — COMPREHENSIVE METABOLIC PANEL
ALT: 17 U/L (ref 0–55)
AST: 23 U/L (ref 5–34)
Albumin: 2.5 g/dL — ABNORMAL LOW (ref 3.5–5.0)
Alkaline Phosphatase: 107 U/L (ref 40–150)
Anion Gap: 7 mEq/L (ref 3–11)
BILIRUBIN TOTAL: 0.38 mg/dL (ref 0.20–1.20)
BUN: 35.2 mg/dL — AB (ref 7.0–26.0)
CO2: 23 meq/L (ref 22–29)
CREATININE: 3.7 mg/dL — AB (ref 0.6–1.1)
Calcium: 9.9 mg/dL (ref 8.4–10.4)
Chloride: 110 mEq/L — ABNORMAL HIGH (ref 98–109)
EGFR: 14 mL/min/{1.73_m2} — ABNORMAL LOW (ref >90)
GLUCOSE: 105 mg/dL (ref 70–140)
Potassium: 4.4 mEq/L (ref 3.5–5.1)
SODIUM: 141 meq/L (ref 136–145)
TOTAL PROTEIN: 6.7 g/dL (ref 6.4–8.3)

## 2015-11-04 LAB — LACTATE DEHYDROGENASE: LDH: 233 U/L (ref 125–245)

## 2015-11-04 NOTE — Progress Notes (Signed)
[place] Telephone:([phone]   Fax:(336) [place] [address]   [phone]  DIAGNOSIS: Monoclonal gammopathy of undetermined significance  PRIOR THERAPY: None  CURRENT THERAPY: Observation.  INTERVAL HISTORY: Kristen English 60 y.o. female returns to the clinic today for three-month followup visit. The patient is feeling fine today with no specific complaints. She is still on the list for the renal transplant and has been running a lot of testing recently and she was asked to look for a kidney donor. She denied having any significant weight loss or night sweats. She has no nausea or vomiting. The patient denied having any aches and pains. She has no chest pain, shortness of breath, cough or hemoptysis.  The patient had repeat CBC and comprehensive metabolic panel performed recently and she is here for evaluation and discussion of her lab results.  MEDICAL HISTORY: Past Medical History:  Diagnosis Date  . Arthritis   . Chronic kidney disease    protein in urine  . Diabetes mellitus without complication ([place])   . Gout   . Hypertension     ALLERGIES:  is allergic to morphine and related.  MEDICATIONS:  Current Outpatient Prescriptions  Medication Sig Dispense Refill  . amLODipine (NORVASC) 10 MG tablet Take 10 mg by mouth at bedtime.     Marland Kitchen aspirin 81 MG tablet Take 81 mg by mouth at bedtime.     Marland Kitchen atorvastatin (LIPITOR) 40 MG tablet     . BOOSTRIX 5-2.5-18.5 injection     . carvedilol (COREG) 25 MG tablet Take 25 mg by mouth 2 (two) times daily with a meal.     . cholecalciferol (VITAMIN D) 1000 UNITS tablet Take 2,000 Units by mouth every morning.     . colchicine 0.6 MG tablet Take 0.6 mg by mouth every 12 (twelve) hours as needed (gout.).     Marland Kitchen ferrous sulfate 325 (65 FE) MG tablet Take 325 mg by mouth daily with breakfast.    . furosemide (LASIX) 40 MG tablet Take 40 mg by mouth  2 (two) times daily as needed for fluid.    Marland Kitchen glipiZIDE (GLUCOTROL) 5 MG tablet Take 5 mg by mouth 3 (three) times daily with meals.    . hydrOXYzine (ATARAX/VISTARIL) 25 MG tablet Take 25 mg by mouth at bedtime as needed for itching.     . Liraglutide (VICTOZA) 18 MG/3ML SOPN Inject 1.8 mg into the skin daily at 12 noon.     . lovastatin (MEVACOR) 10 MG tablet Take 10 mg by mouth at bedtime.     Marland Kitchen NOVOFINE 32G X 6 MM MISC      No current facility-administered medications for this visit.     SURGICAL HISTORY:  Past Surgical History:  Procedure Laterality Date  . COLONOSCOPY WITH PROPOFOL N/A 05/11/2014   Procedure: COLONOSCOPY WITH PROPOFOL;  Surgeon: Garlan Fair, MD;  Location: [facility];  Service: Endoscopy;  Laterality: N/A;  . FOOT SURGERY Left 2007  . JOINT REPLACEMENT  2009   bilateral knees  . TOTAL KNEE ARTHROPLASTY     bilateral    REVIEW OF SYSTEMS:  A comprehensive review of systems was negative.   PHYSICAL EXAMINATION: General appearance: alert, cooperative and no distress Head: Normocephalic, without obvious abnormality, atraumatic Neck: no adenopathy, no JVD, supple, symmetrical, trachea midline and thyroid not enlarged, symmetric, no tenderness/mass/nodules Lymph nodes: Cervical, supraclavicular, and axillary nodes normal. Resp:  clear to auscultation bilaterally Back: symmetric, no curvature. ROM normal. No CVA tenderness. Cardio: regular rate and rhythm, S1, S2 normal, no murmur, click, rub or gallop GI: soft, non-tender; bowel sounds normal; no masses,  no organomegaly Extremities: extremities normal, atraumatic, no cyanosis or edema Neurologic: Alert and oriented X 3, normal strength and tone. Normal symmetric reflexes. Normal coordination and gait  ECOG PERFORMANCE STATUS: 1 - Symptomatic but completely ambulatory  Blood pressure (!) 180/100, pulse 96, temperature 99.1 F (37.3 C), temperature source Oral, resp. rate 19, height 5\' 9"  (1.753 m), weight  293 lb 9.6 oz (133.2 kg), SpO2 100 %. Repeat blood pressure was 152/82.  LABORATORY DATA: Lab Results  Component Value Date   WBC 7.8 11/04/2015   HGB 9.9 (L) 11/04/2015   HCT 31.2 (L) 11/04/2015   MCV 85.4 11/04/2015   PLT 179 11/04/2015      Chemistry      Component Value Date/Time   NA 142 07/30/2015 0858   K 4.3 07/30/2015 0858   CL 102 02/25/2008 0548   CO2 24 07/30/2015 0858   BUN 34.5 (H) 07/30/2015 0858   CREATININE 3.6 (HH) 07/30/2015 0858      Component Value Date/Time   CALCIUM 9.7 07/30/2015 0858   ALKPHOS 97 07/30/2015 0858   AST 21 07/30/2015 0858   ALT 18 07/30/2015 0858   BILITOT <0.30 07/30/2015 0858     Other lab results: Myeloma panel is Pending  RADIOGRAPHIC STUDIES: No results found. 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  Her CBC is stable from the last visit. The myeloma panel is still pending.  If there is no evidence of disease progression on the myeloma panel today, I would see her back for follow-up visit in 3 months with repeat blood work including myeloma panel. His myeloma panel showed evidence for disease progression, I will bring the patient sooner for discussion of her treatment options. For the renal insufficiency, she will continue her routine follow-up visit and evaluation by Dr. Moshe Cipro and currently undergoing evaluation at [facility] for kidney transplant. For hypertension, strongly encouraged the patient to take her blood pressure medication as ordered and to consult with her primary care physician for any adjustment of her medication. She was advised to call immediately if she has any concerning symptoms in the interval.  The patient voices understanding of current disease status and treatment options and is in agreement with the current care plan.  All  questions were answered. The patient knows to call the clinic with any problems, questions or concerns. We can certainly see the patient much sooner if necessary.  Disclaimer: This note was dictated with voice recognition software. Similar sounding words can inadvertently be transcribed and may not be corrected upon review.

## 2015-11-04 NOTE — Telephone Encounter (Signed)
Gave patient avs report and appointments for November.  °

## 2015-11-05 ENCOUNTER — Encounter: Payer: Self-pay | Admitting: Internal Medicine

## 2015-11-05 LAB — KAPPA/LAMBDA LIGHT CHAINS
IG LAMBDA FREE LIGHT CHAIN: 77.8 mg/L — AB (ref 5.7–26.3)
Ig Kappa Free Light Chain: 173.2 mg/L — ABNORMAL HIGH (ref 3.3–19.4)
KAPPA/LAMBDA FLC RATIO: 2.23 — AB (ref 0.26–1.65)

## 2015-11-05 LAB — BETA 2 MICROGLOBULIN, SERUM: Beta-2: 9.3 mg/L — ABNORMAL HIGH (ref 0.6–2.4)

## 2015-11-05 LAB — IGG, IGA, IGM
IgA, Qn, Serum: 378 mg/dL — ABNORMAL HIGH (ref 87–352)
IgG, Qn, Serum: 1089 mg/dL (ref 700–1600)
IgM, Qn, Serum: 112 mg/dL (ref 26–217)

## 2015-11-05 NOTE — Progress Notes (Signed)
Faxed office notes from 11/04/15 to [place] w/ pt's PCP's office.

## 2015-11-06 ENCOUNTER — Other Ambulatory Visit: Payer: Self-pay | Admitting: Internal Medicine

## 2015-11-06 DIAGNOSIS — E8809 Other disorders of plasma-protein metabolism, not elsewhere classified: Secondary | ICD-10-CM

## 2015-11-16 DIAGNOSIS — E782 Mixed hyperlipidemia: Secondary | ICD-10-CM | POA: Diagnosis not present

## 2015-11-25 DIAGNOSIS — N2581 Secondary hyperparathyroidism of renal origin: Secondary | ICD-10-CM | POA: Diagnosis not present

## 2015-11-25 DIAGNOSIS — N183 Chronic kidney disease, stage 3 (moderate): Secondary | ICD-10-CM | POA: Diagnosis not present

## 2015-12-30 DIAGNOSIS — I10 Essential (primary) hypertension: Secondary | ICD-10-CM | POA: Diagnosis not present

## 2015-12-30 DIAGNOSIS — M109 Gout, unspecified: Secondary | ICD-10-CM | POA: Diagnosis not present

## 2015-12-30 DIAGNOSIS — Z6841 Body Mass Index (BMI) 40.0 and over, adult: Secondary | ICD-10-CM | POA: Diagnosis not present

## 2015-12-30 DIAGNOSIS — E119 Type 2 diabetes mellitus without complications: Secondary | ICD-10-CM | POA: Diagnosis not present

## 2015-12-30 DIAGNOSIS — N183 Chronic kidney disease, stage 3 (moderate): Secondary | ICD-10-CM | POA: Diagnosis not present

## 2015-12-30 DIAGNOSIS — E785 Hyperlipidemia, unspecified: Secondary | ICD-10-CM | POA: Diagnosis not present

## 2016-01-19 DIAGNOSIS — N183 Chronic kidney disease, stage 3 (moderate): Secondary | ICD-10-CM | POA: Diagnosis not present

## 2016-01-24 DIAGNOSIS — E669 Obesity, unspecified: Secondary | ICD-10-CM | POA: Diagnosis not present

## 2016-01-24 DIAGNOSIS — Z7982 Long term (current) use of aspirin: Secondary | ICD-10-CM | POA: Diagnosis not present

## 2016-01-24 DIAGNOSIS — Z79899 Other long term (current) drug therapy: Secondary | ICD-10-CM | POA: Diagnosis not present

## 2016-01-24 DIAGNOSIS — Z01810 Encounter for preprocedural cardiovascular examination: Secondary | ICD-10-CM | POA: Diagnosis not present

## 2016-01-24 DIAGNOSIS — E1122 Type 2 diabetes mellitus with diabetic chronic kidney disease: Secondary | ICD-10-CM | POA: Diagnosis not present

## 2016-01-24 DIAGNOSIS — I129 Hypertensive chronic kidney disease with stage 1 through stage 4 chronic kidney disease, or unspecified chronic kidney disease: Secondary | ICD-10-CM | POA: Diagnosis not present

## 2016-01-24 DIAGNOSIS — N189 Chronic kidney disease, unspecified: Secondary | ICD-10-CM | POA: Diagnosis not present

## 2016-01-24 DIAGNOSIS — Z7984 Long term (current) use of oral hypoglycemic drugs: Secondary | ICD-10-CM | POA: Diagnosis not present

## 2016-01-24 DIAGNOSIS — M109 Gout, unspecified: Secondary | ICD-10-CM | POA: Diagnosis not present

## 2016-01-24 DIAGNOSIS — Z01818 Encounter for other preprocedural examination: Secondary | ICD-10-CM | POA: Diagnosis not present

## 2016-01-27 ENCOUNTER — Encounter: Payer: Self-pay | Admitting: Emergency Medicine

## 2016-01-27 ENCOUNTER — Other Ambulatory Visit (HOSPITAL_BASED_OUTPATIENT_CLINIC_OR_DEPARTMENT_OTHER): Payer: Commercial Managed Care - HMO

## 2016-01-27 DIAGNOSIS — E8809 Other disorders of plasma-protein metabolism, not elsewhere classified: Secondary | ICD-10-CM | POA: Diagnosis not present

## 2016-01-27 DIAGNOSIS — D472 Monoclonal gammopathy: Secondary | ICD-10-CM

## 2016-01-27 LAB — LACTATE DEHYDROGENASE: LDH: 225 U/L (ref 125–245)

## 2016-01-27 LAB — CBC WITH DIFFERENTIAL/PLATELET
BASO%: 0.2 % (ref 0.0–2.0)
Basophils Absolute: 0 10*3/uL (ref 0.0–0.1)
EOS%: 3 % (ref 0.0–7.0)
Eosinophils Absolute: 0.1 10*3/uL (ref 0.0–0.5)
HEMATOCRIT: 30.7 % — AB (ref 34.8–46.6)
HEMOGLOBIN: 9.4 g/dL — AB (ref 11.6–15.9)
LYMPH#: 1.2 10*3/uL (ref 0.9–3.3)
LYMPH%: 25.1 % (ref 14.0–49.7)
MCH: 26.6 pg (ref 25.1–34.0)
MCHC: 30.6 g/dL — ABNORMAL LOW (ref 31.5–36.0)
MCV: 86.7 fL (ref 79.5–101.0)
MONO#: 0.3 10*3/uL (ref 0.1–0.9)
MONO%: 5.7 % (ref 0.0–14.0)
NEUT%: 66 % (ref 38.4–76.8)
NEUTROS ABS: 3.1 10*3/uL (ref 1.5–6.5)
PLATELETS: 168 10*3/uL (ref 145–400)
RBC: 3.54 10*6/uL — ABNORMAL LOW (ref 3.70–5.45)
RDW: 15 % — AB (ref 11.2–14.5)
WBC: 4.7 10*3/uL (ref 3.9–10.3)

## 2016-01-27 LAB — COMPREHENSIVE METABOLIC PANEL
ALT: 18 U/L (ref 0–55)
AST: 23 U/L (ref 5–34)
Albumin: 2.7 g/dL — ABNORMAL LOW (ref 3.5–5.0)
Alkaline Phosphatase: 95 U/L (ref 40–150)
Anion Gap: 8 mEq/L (ref 3–11)
BUN: 34.5 mg/dL — ABNORMAL HIGH (ref 7.0–26.0)
CO2: 21 mEq/L — ABNORMAL LOW (ref 22–29)
Calcium: 9.6 mg/dL (ref 8.4–10.4)
Chloride: 115 mEq/L — ABNORMAL HIGH (ref 98–109)
Creatinine: 3.6 mg/dL (ref 0.6–1.1)
EGFR: 15 mL/min/{1.73_m2} — ABNORMAL LOW (ref >90)
Glucose: 100 mg/dl (ref 70–140)
Potassium: 4.2 mEq/L (ref 3.5–5.1)
Sodium: 144 mEq/L (ref 136–145)
Total Bilirubin: 0.4 mg/dL (ref 0.20–1.20)
Total Protein: 6.8 g/dL (ref 6.4–8.3)

## 2016-01-28 LAB — KAPPA/LAMBDA LIGHT CHAINS
IG LAMBDA FREE LIGHT CHAIN: 86.8 mg/L — AB (ref 5.7–26.3)
Ig Kappa Free Light Chain: 185.1 mg/L — ABNORMAL HIGH (ref 3.3–19.4)
KAPPA/LAMBDA FLC RATIO: 2.13 — AB (ref 0.26–1.65)

## 2016-01-28 LAB — IGG, IGA, IGM
IGM (IMMUNOGLOBIN M), SRM: 109 mg/dL (ref 26–217)
IgA, Qn, Serum: 414 mg/dL — ABNORMAL HIGH (ref 87–352)
IgG, Qn, Serum: 1301 mg/dL (ref 700–1600)

## 2016-01-28 LAB — BETA 2 MICROGLOBULIN, SERUM: Beta-2: 9.2 mg/L — ABNORMAL HIGH (ref 0.6–2.4)

## 2016-02-03 ENCOUNTER — Telehealth: Payer: Self-pay | Admitting: Internal Medicine

## 2016-02-03 ENCOUNTER — Ambulatory Visit (HOSPITAL_BASED_OUTPATIENT_CLINIC_OR_DEPARTMENT_OTHER): Payer: Commercial Managed Care - HMO | Admitting: Internal Medicine

## 2016-02-03 ENCOUNTER — Encounter: Payer: Self-pay | Admitting: Internal Medicine

## 2016-02-03 VITALS — BP 179/86 | HR 99 | Temp 98.0°F | Resp 18 | Ht 69.0 in | Wt 298.0 lb

## 2016-02-03 DIAGNOSIS — N289 Disorder of kidney and ureter, unspecified: Secondary | ICD-10-CM

## 2016-02-03 DIAGNOSIS — D472 Monoclonal gammopathy: Secondary | ICD-10-CM | POA: Diagnosis not present

## 2016-02-03 DIAGNOSIS — I10 Essential (primary) hypertension: Secondary | ICD-10-CM

## 2016-02-03 DIAGNOSIS — D890 Polyclonal hypergammaglobulinemia: Principal | ICD-10-CM

## 2016-02-03 DIAGNOSIS — E8809 Other disorders of plasma-protein metabolism, not elsewhere classified: Secondary | ICD-10-CM

## 2016-02-03 NOTE — Progress Notes (Signed)
[place] Telephone:(336) [phone]   Fax:(336) [place] [address] [place] [address]  DIAGNOSIS: Monoclonal gammopathy of undetermined significance  PRIOR THERAPY: None  CURRENT THERAPY: Observation.  INTERVAL HISTORY: Kristen English 60 y.o. female returns to the clinic today for three-month followup visit. The patient is feeling fine today with no specific complaints. She is still on the list for the renal transplant and she is expected to lose at least 30 pounds to qualify for the transplant. She denied having any significant weight loss or night sweats. She has no nausea or vomiting. The patient denied having any aches and pains. She has no chest pain, shortness of breath, cough or hemoptysis. Her blood pressure is uncontrolled with the current blood pressure medication. The patient had repeat CBC and comprehensive metabolic panel performed recently and she is here for evaluation and discussion of her lab results.  MEDICAL HISTORY: Past Medical History:  Diagnosis Date  . Arthritis   . Chronic kidney disease    protein in urine  . Diabetes mellitus without complication ([place])   . Gout   . Hypertension     ALLERGIES:  is allergic to morphine and related.  MEDICATIONS:  Current Outpatient Prescriptions  Medication Sig Dispense Refill  . amLODipine (NORVASC) 10 MG tablet Take 10 mg by mouth at bedtime.     Marland Kitchen aspirin 81 MG tablet Take 81 mg by mouth at bedtime.     Marland Kitchen atorvastatin (LIPITOR) 40 MG tablet     . BOOSTRIX 5-2.5-18.5 injection     . carvedilol (COREG) 25 MG tablet Take 25 mg by mouth 2 (two) times daily with a meal.     . cholecalciferol (VITAMIN D) 1000 UNITS tablet Take 2,000 Units by mouth every morning.     . colchicine 0.6 MG tablet Take 0.6 mg by mouth every 12 (twelve) hours as needed (gout.).     Marland Kitchen ferrous sulfate (FERROUSUL) 325 (65 FE) MG tablet Take 325 mg by mouth.      . ferrous sulfate 325 (65 FE) MG tablet Take 325 mg by mouth daily with breakfast.    . furosemide (LASIX) 40 MG tablet Take 40 mg by mouth 2 (two) times daily as needed for fluid.    Marland Kitchen glipiZIDE (GLUCOTROL) 5 MG tablet Take 5 mg by mouth 3 (three) times daily with meals.    . hydrOXYzine (ATARAX/VISTARIL) 25 MG tablet Take 25 mg by mouth at bedtime as needed for itching.     . Liraglutide (VICTOZA) 18 MG/3ML SOPN Inject 1.8 mg into the skin daily at 12 noon.     . lovastatin (MEVACOR) 10 MG tablet Take 10 mg by mouth at bedtime.     Marland Kitchen NOVOFINE 32G X 6 MM MISC      No current facility-administered medications for this visit.     SURGICAL HISTORY:  Past Surgical History:  Procedure Laterality Date  . COLONOSCOPY WITH PROPOFOL N/A 05/11/2014   Procedure: COLONOSCOPY WITH PROPOFOL;  Surgeon: Garlan Fair, MD;  Location: [facility];  Service: Endoscopy;  Laterality: N/A;  . FOOT SURGERY Left 2007  . JOINT REPLACEMENT  2009   bilateral knees  . TOTAL KNEE ARTHROPLASTY     bilateral    REVIEW OF SYSTEMS:  A comprehensive review of systems was negative.   PHYSICAL EXAMINATION: General appearance: alert, cooperative and no distress Head: Normocephalic, without obvious abnormality, atraumatic Neck:  no adenopathy, no JVD, supple, symmetrical, trachea midline and thyroid not enlarged, symmetric, no tenderness/mass/nodules Lymph nodes: Cervical, supraclavicular, and axillary nodes normal. Resp: clear to auscultation bilaterally Back: symmetric, no curvature. ROM normal. No CVA tenderness. Cardio: regular rate and rhythm, S1, S2 normal, no murmur, click, rub or gallop GI: soft, non-tender; bowel sounds normal; no masses,  no organomegaly Extremities: extremities normal, atraumatic, no cyanosis or edema Neurologic: Alert and oriented X 3, normal strength and tone. Normal symmetric reflexes. Normal coordination and gait  ECOG PERFORMANCE STATUS: 1 - Symptomatic but completely  ambulatory  Blood pressure (!) 179/86, pulse 99, temperature 98 F (36.7 C), temperature source Oral, resp. rate 18, height 5\' 9"  (1.753 m), weight 298 lb (135.2 kg), SpO2 98 %.   LABORATORY DATA: Lab Results  Component Value Date   WBC 4.7 01/27/2016   HGB 9.4 (L) 01/27/2016   HCT 30.7 (L) 01/27/2016   MCV 86.7 01/27/2016   PLT 168 01/27/2016      Chemistry      Component Value Date/Time   NA 144 01/27/2016 1136   K 4.2 01/27/2016 1136   CL 102 02/25/2008 0548   CO2 21 (L) 01/27/2016 1136   BUN 34.5 (H) 01/27/2016 1136   CREATININE 3.6 (HH) 01/27/2016 1136      Component Value Date/Time   CALCIUM 9.6 01/27/2016 1136   ALKPHOS 95 01/27/2016 1136   AST 23 01/27/2016 1136   ALT 18 01/27/2016 1136   BILITOT 0.40 01/27/2016 1136     Other lab results: Myeloma panel is Pending  RADIOGRAPHIC STUDIES: No results found. 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  Her CBC is stable from the last visit.  Her myeloma panel is stable. I would see her back for follow-up visit in 3 months with repeat blood work including myeloma panel.  For the renal insufficiency, she will continue her routine follow-up visit and evaluation by Dr. Moshe Cipro and currently undergoing evaluation at [facility] for kidney transplant. For hypertension, I strongly encouraged the patient to take her blood pressure medication as ordered and to consult with her primary care physician and nephrologist for any adjustment of her medication. She was advised to call immediately if she has any concerning symptoms in the interval.  The patient voices understanding of current disease status and treatment options and is in agreement with the current care plan.  All questions were answered. The patient knows to call the clinic with any problems,  questions or concerns. We can certainly see the patient much sooner if necessary.  Disclaimer: This note was dictated with voice recognition software. Similar sounding words can inadvertently be transcribed and may not be corrected upon review.

## 2016-02-03 NOTE — Telephone Encounter (Signed)
Gave patient avs report and appointments for February.  °

## 2016-03-11 DIAGNOSIS — E119 Type 2 diabetes mellitus without complications: Secondary | ICD-10-CM | POA: Diagnosis not present

## 2016-03-15 DIAGNOSIS — H524 Presbyopia: Secondary | ICD-10-CM | POA: Diagnosis not present

## 2016-03-15 DIAGNOSIS — H5203 Hypermetropia, bilateral: Secondary | ICD-10-CM | POA: Diagnosis not present

## 2016-03-15 DIAGNOSIS — H52209 Unspecified astigmatism, unspecified eye: Secondary | ICD-10-CM | POA: Diagnosis not present

## 2016-03-30 DIAGNOSIS — I10 Essential (primary) hypertension: Secondary | ICD-10-CM | POA: Diagnosis not present

## 2016-03-30 DIAGNOSIS — N184 Chronic kidney disease, stage 4 (severe): Secondary | ICD-10-CM | POA: Diagnosis not present

## 2016-03-30 DIAGNOSIS — E782 Mixed hyperlipidemia: Secondary | ICD-10-CM | POA: Diagnosis not present

## 2016-03-30 DIAGNOSIS — Z23 Encounter for immunization: Secondary | ICD-10-CM | POA: Diagnosis not present

## 2016-03-30 DIAGNOSIS — Z6833 Body mass index (BMI) 33.0-33.9, adult: Secondary | ICD-10-CM | POA: Diagnosis not present

## 2016-03-30 DIAGNOSIS — E1122 Type 2 diabetes mellitus with diabetic chronic kidney disease: Secondary | ICD-10-CM | POA: Diagnosis not present

## 2016-03-30 DIAGNOSIS — D472 Monoclonal gammopathy: Secondary | ICD-10-CM | POA: Diagnosis not present

## 2016-03-30 DIAGNOSIS — Z794 Long term (current) use of insulin: Secondary | ICD-10-CM | POA: Diagnosis not present

## 2016-04-21 DIAGNOSIS — N183 Chronic kidney disease, stage 3 (moderate): Secondary | ICD-10-CM | POA: Diagnosis not present

## 2016-04-21 DIAGNOSIS — Z6841 Body Mass Index (BMI) 40.0 and over, adult: Secondary | ICD-10-CM | POA: Diagnosis not present

## 2016-04-21 DIAGNOSIS — I10 Essential (primary) hypertension: Secondary | ICD-10-CM | POA: Diagnosis not present

## 2016-04-21 DIAGNOSIS — D631 Anemia in chronic kidney disease: Secondary | ICD-10-CM | POA: Diagnosis not present

## 2016-04-21 DIAGNOSIS — E785 Hyperlipidemia, unspecified: Secondary | ICD-10-CM | POA: Diagnosis not present

## 2016-04-21 DIAGNOSIS — M109 Gout, unspecified: Secondary | ICD-10-CM | POA: Diagnosis not present

## 2016-04-21 DIAGNOSIS — E119 Type 2 diabetes mellitus without complications: Secondary | ICD-10-CM | POA: Diagnosis not present

## 2016-04-21 DIAGNOSIS — N2581 Secondary hyperparathyroidism of renal origin: Secondary | ICD-10-CM | POA: Diagnosis not present

## 2016-04-27 ENCOUNTER — Other Ambulatory Visit (HOSPITAL_BASED_OUTPATIENT_CLINIC_OR_DEPARTMENT_OTHER): Payer: Commercial Managed Care - HMO

## 2016-04-27 DIAGNOSIS — D890 Polyclonal hypergammaglobulinemia: Secondary | ICD-10-CM | POA: Diagnosis not present

## 2016-04-27 DIAGNOSIS — E8809 Other disorders of plasma-protein metabolism, not elsewhere classified: Secondary | ICD-10-CM

## 2016-04-27 DIAGNOSIS — D472 Monoclonal gammopathy: Secondary | ICD-10-CM | POA: Diagnosis not present

## 2016-04-27 LAB — COMPREHENSIVE METABOLIC PANEL
ALBUMIN: 2.9 g/dL — AB (ref 3.5–5.0)
ALK PHOS: 131 U/L (ref 40–150)
ALT: 21 U/L (ref 0–55)
AST: 22 U/L (ref 5–34)
Anion Gap: 7 mEq/L (ref 3–11)
BUN: 32.7 mg/dL — AB (ref 7.0–26.0)
CHLORIDE: 108 meq/L (ref 98–109)
CO2: 26 mEq/L (ref 22–29)
Calcium: 9.9 mg/dL (ref 8.4–10.4)
Creatinine: 3.4 mg/dL (ref 0.6–1.1)
EGFR: 16 mL/min/{1.73_m2} — AB (ref >90)
GLUCOSE: 124 mg/dL (ref 70–140)
POTASSIUM: 4 meq/L (ref 3.5–5.1)
SODIUM: 142 meq/L (ref 136–145)
Total Bilirubin: 0.35 mg/dL (ref 0.20–1.20)
Total Protein: 7.2 g/dL (ref 6.4–8.3)

## 2016-04-27 LAB — LACTATE DEHYDROGENASE: LDH: 215 U/L (ref 125–245)

## 2016-04-27 LAB — CBC WITH DIFFERENTIAL/PLATELET
BASO%: 0.2 % (ref 0.0–2.0)
Basophils Absolute: 0 10*3/uL (ref 0.0–0.1)
EOS%: 2.6 % (ref 0.0–7.0)
Eosinophils Absolute: 0.1 10*3/uL (ref 0.0–0.5)
HCT: 33 % — ABNORMAL LOW (ref 34.8–46.6)
HGB: 10.3 g/dL — ABNORMAL LOW (ref 11.6–15.9)
LYMPH#: 1 10*3/uL (ref 0.9–3.3)
LYMPH%: 21.2 % (ref 14.0–49.7)
MCH: 27.2 pg (ref 25.1–34.0)
MCHC: 31.2 g/dL — AB (ref 31.5–36.0)
MCV: 87.1 fL (ref 79.5–101.0)
MONO#: 0.2 10*3/uL (ref 0.1–0.9)
MONO%: 4.2 % (ref 0.0–14.0)
NEUT#: 3.3 10*3/uL (ref 1.5–6.5)
NEUT%: 71.8 % (ref 38.4–76.8)
Platelets: 174 10*3/uL (ref 145–400)
RBC: 3.79 10*6/uL (ref 3.70–5.45)
RDW: 14.5 % (ref 11.2–14.5)
WBC: 4.5 10*3/uL (ref 3.9–10.3)

## 2016-04-28 LAB — BETA 2 MICROGLOBULIN, SERUM: BETA 2: 9.4 mg/L — AB (ref 0.6–2.4)

## 2016-04-28 LAB — IGG, IGA, IGM
IGA/IMMUNOGLOBULIN A, SERUM: 428 mg/dL — AB (ref 87–352)
IgM, Qn, Serum: 115 mg/dL (ref 26–217)

## 2016-04-28 LAB — KAPPA/LAMBDA LIGHT CHAINS
Ig Kappa Free Light Chain: 180.3 mg/L — ABNORMAL HIGH (ref 3.3–19.4)
Ig Lambda Free Light Chain: 93.1 mg/L — ABNORMAL HIGH (ref 5.7–26.3)
Kappa/Lambda FluidC Ratio: 1.94 — ABNORMAL HIGH (ref 0.26–1.65)

## 2016-05-01 DIAGNOSIS — Z1231 Encounter for screening mammogram for malignant neoplasm of breast: Secondary | ICD-10-CM | POA: Diagnosis not present

## 2016-05-01 DIAGNOSIS — Z803 Family history of malignant neoplasm of breast: Secondary | ICD-10-CM | POA: Diagnosis not present

## 2016-05-04 ENCOUNTER — Telehealth: Payer: Self-pay | Admitting: Internal Medicine

## 2016-05-04 ENCOUNTER — Ambulatory Visit (HOSPITAL_BASED_OUTPATIENT_CLINIC_OR_DEPARTMENT_OTHER): Payer: Medicare HMO | Admitting: Internal Medicine

## 2016-05-04 VITALS — BP 143/78 | HR 86 | Temp 98.0°F | Resp 18 | Ht 69.0 in | Wt 295.6 lb

## 2016-05-04 DIAGNOSIS — N289 Disorder of kidney and ureter, unspecified: Secondary | ICD-10-CM

## 2016-05-04 DIAGNOSIS — E8809 Other disorders of plasma-protein metabolism, not elsewhere classified: Secondary | ICD-10-CM

## 2016-05-04 DIAGNOSIS — D472 Monoclonal gammopathy: Secondary | ICD-10-CM | POA: Diagnosis not present

## 2016-05-04 DIAGNOSIS — D890 Polyclonal hypergammaglobulinemia: Principal | ICD-10-CM

## 2016-05-04 NOTE — Telephone Encounter (Signed)
Appointments scheduled per 2/15 LOS. Patient given AVS report and calendars with future scheduled appointments. °

## 2016-05-04 NOTE — Progress Notes (Signed)
[place] Telephone:(336) [phone]   Fax:(336) [address] [place] [address]  DIAGNOSIS: Monoclonal gammopathy of undetermined significance  PRIOR THERAPY: None  CURRENT THERAPY: Observation.  INTERVAL HISTORY: Kristen English 61 y.o. female came to the clinic today for three-month follow-up visit. The patient is feeling fine with no specific complaints except for mild fatigue. She denied having any significant weight loss or night sweats. She has no nausea, vomiting, diarrhea or constipation. She has no chest pain, shortness of breath, cough or hemoptysis. She is still on the list for the renal transplant at [facility]. She had repeat myeloma panel performed recently and she is here for evaluation and discussion of her lab results.  MEDICAL HISTORY: Past Medical History:  Diagnosis Date  . Arthritis   . Chronic kidney disease    protein in urine  . Diabetes mellitus without complication ([place])   . Gout   . Hypertension     ALLERGIES:  is allergic to morphine and related.  MEDICATIONS:  Current Outpatient Prescriptions  Medication Sig Dispense Refill  . amLODipine (NORVASC) 10 MG tablet Take 10 mg by mouth at bedtime.     Marland Kitchen aspirin 81 MG tablet Take 81 mg by mouth at bedtime.     Marland Kitchen atorvastatin (LIPITOR) 40 MG tablet     . BOOSTRIX 5-2.5-18.5 injection     . carvedilol (COREG) 25 MG tablet Take 25 mg by mouth 2 (two) times daily with a meal.     . cholecalciferol (VITAMIN D) 1000 UNITS tablet Take 2,000 Units by mouth every morning.     . colchicine 0.6 MG tablet Take 0.6 mg by mouth every 12 (twelve) hours as needed (gout.).     Marland Kitchen ferrous sulfate (FERROUSUL) 325 (65 FE) MG tablet Take 325 mg by mouth.    . ferrous sulfate 325 (65 FE) MG tablet Take 325 mg by mouth daily with breakfast.    . furosemide (LASIX) 40 MG tablet Take 40 mg by mouth 2 (two) times daily as  needed for fluid.    Marland Kitchen glipiZIDE (GLUCOTROL) 5 MG tablet Take 5 mg by mouth 3 (three) times daily with meals.    . hydrOXYzine (ATARAX/VISTARIL) 25 MG tablet Take 25 mg by mouth at bedtime as needed for itching.     . Liraglutide (VICTOZA) 18 MG/3ML SOPN Inject 1.8 mg into the skin daily at 12 noon.     . lovastatin (MEVACOR) 10 MG tablet Take 10 mg by mouth at bedtime.     Marland Kitchen NOVOFINE 32G X 6 MM MISC      No current facility-administered medications for this visit.     SURGICAL HISTORY:  Past Surgical History:  Procedure Laterality Date  . COLONOSCOPY WITH PROPOFOL N/A 05/11/2014   Procedure: COLONOSCOPY WITH PROPOFOL;  Surgeon: Garlan Fair, MD;  Location: [facility];  Service: Endoscopy;  Laterality: N/A;  . FOOT SURGERY Left 2007  . JOINT REPLACEMENT  2009   bilateral knees  . TOTAL KNEE ARTHROPLASTY     bilateral    REVIEW OF SYSTEMS:  A comprehensive review of systems was negative except for: Constitutional: positive for fatigue   PHYSICAL EXAMINATION: General appearance: alert, cooperative and no distress Head: Normocephalic, without obvious abnormality, atraumatic Neck: no adenopathy, no JVD, supple, symmetrical, trachea midline and thyroid not enlarged, symmetric, no tenderness/mass/nodules Lymph nodes: Cervical, supraclavicular, and axillary nodes normal. Resp:  clear to auscultation bilaterally Back: symmetric, no curvature. ROM normal. No CVA tenderness. Cardio: regular rate and rhythm, S1, S2 normal, no murmur, click, rub or gallop GI: soft, non-tender; bowel sounds normal; no masses,  no organomegaly Extremities: extremities normal, atraumatic, no cyanosis or edema  ECOG PERFORMANCE STATUS: 1 - Symptomatic but completely ambulatory  Blood pressure (!) 143/78, pulse 86, temperature 98 F (36.7 C), temperature source Oral, resp. rate 18, height 5\' 9"  (1.753 m), weight 295 lb 9.6 oz (134.1 kg), SpO2 98 %.   LABORATORY DATA: Lab Results  Component Value Date    WBC 4.5 04/27/2016   HGB 10.3 (L) 04/27/2016   HCT 33.0 (L) 04/27/2016   MCV 87.1 04/27/2016   PLT 174 04/27/2016      Chemistry      Component Value Date/Time   NA 142 04/27/2016 1104   K 4.0 04/27/2016 1104   CL 102 02/25/2008 0548   CO2 26 04/27/2016 1104   BUN 32.7 (H) 04/27/2016 1104   CREATININE 3.4 (HH) 04/27/2016 1104      Component Value Date/Time   CALCIUM 9.9 04/27/2016 1104   ALKPHOS 131 04/27/2016 1104   AST 22 04/27/2016 1104   ALT 21 04/27/2016 1104   BILITOT 0.35 04/27/2016 1104     Other lab results:   RADIOGRAPHIC STUDIES: No results found. 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 The patient is currently on observation. She had a recent myeloma panel that showed no evidence for disease progression. I discussed the lab result with the patient today and recommended for her to continue on observation with repeat myeloma panel in 3 months. For the insufficiency she is followed by nephrology and she is on the list for the renal transplant at [facility]. She was advised to call immediately if she has any concerning symptoms in the interval. The patient voices understanding of current disease status and treatment options and is in agreement with the current care plan.  All questions were answered. The patient knows to call the clinic with any problems, questions or concerns. We can certainly see the patient much sooner if necessary. I spent 10 minutes counseling the patient face to face. The total time spent in the appointment was 15 minutes. Disclaimer: This note was dictated with voice recognition software. Similar sounding words can inadvertently be transcribed and may not be corrected upon review.

## 2016-05-06 ENCOUNTER — Encounter: Payer: Self-pay | Admitting: Internal Medicine

## 2016-07-24 DIAGNOSIS — I120 Hypertensive chronic kidney disease with stage 5 chronic kidney disease or end stage renal disease: Secondary | ICD-10-CM | POA: Diagnosis not present

## 2016-07-24 DIAGNOSIS — E1122 Type 2 diabetes mellitus with diabetic chronic kidney disease: Secondary | ICD-10-CM | POA: Diagnosis not present

## 2016-07-24 DIAGNOSIS — N186 End stage renal disease: Secondary | ICD-10-CM | POA: Diagnosis not present

## 2016-07-24 DIAGNOSIS — Z01818 Encounter for other preprocedural examination: Secondary | ICD-10-CM | POA: Diagnosis not present

## 2016-07-24 DIAGNOSIS — D472 Monoclonal gammopathy: Secondary | ICD-10-CM | POA: Diagnosis not present

## 2016-07-24 DIAGNOSIS — N184 Chronic kidney disease, stage 4 (severe): Secondary | ICD-10-CM | POA: Diagnosis not present

## 2016-07-24 DIAGNOSIS — Z79899 Other long term (current) drug therapy: Secondary | ICD-10-CM | POA: Diagnosis not present

## 2016-07-24 DIAGNOSIS — Z7682 Awaiting organ transplant status: Secondary | ICD-10-CM | POA: Diagnosis not present

## 2016-08-01 ENCOUNTER — Other Ambulatory Visit (HOSPITAL_BASED_OUTPATIENT_CLINIC_OR_DEPARTMENT_OTHER): Payer: Medicare HMO

## 2016-08-01 DIAGNOSIS — D890 Polyclonal hypergammaglobulinemia: Secondary | ICD-10-CM | POA: Diagnosis not present

## 2016-08-01 DIAGNOSIS — E8809 Other disorders of plasma-protein metabolism, not elsewhere classified: Secondary | ICD-10-CM

## 2016-08-01 DIAGNOSIS — D472 Monoclonal gammopathy: Secondary | ICD-10-CM | POA: Diagnosis not present

## 2016-08-01 LAB — CBC WITH DIFFERENTIAL/PLATELET
BASO%: 0.6 % (ref 0.0–2.0)
Basophils Absolute: 0 10*3/uL (ref 0.0–0.1)
EOS%: 2.8 % (ref 0.0–7.0)
Eosinophils Absolute: 0.1 10*3/uL (ref 0.0–0.5)
HCT: 30.7 % — ABNORMAL LOW (ref 34.8–46.6)
HGB: 9.8 g/dL — ABNORMAL LOW (ref 11.6–15.9)
LYMPH%: 21.7 % (ref 14.0–49.7)
MCH: 27.5 pg (ref 25.1–34.0)
MCHC: 31.9 g/dL (ref 31.5–36.0)
MCV: 86 fL (ref 79.5–101.0)
MONO#: 0.3 10*3/uL (ref 0.1–0.9)
MONO%: 6.1 % (ref 0.0–14.0)
NEUT#: 3 10*3/uL (ref 1.5–6.5)
NEUT%: 68.8 % (ref 38.4–76.8)
Platelets: 202 10*3/uL (ref 145–400)
RBC: 3.56 10*6/uL — ABNORMAL LOW (ref 3.70–5.45)
RDW: 14.8 % — ABNORMAL HIGH (ref 11.2–14.5)
WBC: 4.3 10*3/uL (ref 3.9–10.3)
lymph#: 0.9 10*3/uL (ref 0.9–3.3)

## 2016-08-01 LAB — COMPREHENSIVE METABOLIC PANEL
ALBUMIN: 2.9 g/dL — AB (ref 3.5–5.0)
ALK PHOS: 114 U/L (ref 40–150)
ALT: 18 U/L (ref 0–55)
AST: 22 U/L (ref 5–34)
Anion Gap: 9 mEq/L (ref 3–11)
BUN: 41.5 mg/dL — ABNORMAL HIGH (ref 7.0–26.0)
CALCIUM: 9.8 mg/dL (ref 8.4–10.4)
CO2: 25 mEq/L (ref 22–29)
CREATININE: 4 mg/dL — AB (ref 0.6–1.1)
Chloride: 112 mEq/L — ABNORMAL HIGH (ref 98–109)
EGFR: 13 mL/min/{1.73_m2} — AB (ref >90)
GLUCOSE: 101 mg/dL (ref 70–140)
Potassium: 4.2 mEq/L (ref 3.5–5.1)
SODIUM: 145 meq/L (ref 136–145)
TOTAL PROTEIN: 6.8 g/dL (ref 6.4–8.3)
Total Bilirubin: 0.37 mg/dL (ref 0.20–1.20)

## 2016-08-01 LAB — LACTATE DEHYDROGENASE: LDH: 211 U/L (ref 125–245)

## 2016-08-02 LAB — KAPPA/LAMBDA LIGHT CHAINS
IG LAMBDA FREE LIGHT CHAIN: 93.3 mg/L — AB (ref 5.7–26.3)
Ig Kappa Free Light Chain: 197.5 mg/L — ABNORMAL HIGH (ref 3.3–19.4)
Kappa/Lambda FluidC Ratio: 2.12 — ABNORMAL HIGH (ref 0.26–1.65)

## 2016-08-02 LAB — IGG, IGA, IGM
IGA/IMMUNOGLOBULIN A, SERUM: 383 mg/dL — AB (ref 87–352)
IgM, Qn, Serum: 93 mg/dL (ref 26–217)

## 2016-08-02 LAB — BETA 2 MICROGLOBULIN, SERUM: BETA 2: 8.5 mg/L — AB (ref 0.6–2.4)

## 2016-08-07 DIAGNOSIS — I10 Essential (primary) hypertension: Secondary | ICD-10-CM | POA: Diagnosis not present

## 2016-08-07 DIAGNOSIS — Z6841 Body Mass Index (BMI) 40.0 and over, adult: Secondary | ICD-10-CM | POA: Diagnosis not present

## 2016-08-07 DIAGNOSIS — M109 Gout, unspecified: Secondary | ICD-10-CM | POA: Diagnosis not present

## 2016-08-07 DIAGNOSIS — N183 Chronic kidney disease, stage 3 (moderate): Secondary | ICD-10-CM | POA: Diagnosis not present

## 2016-08-07 DIAGNOSIS — E119 Type 2 diabetes mellitus without complications: Secondary | ICD-10-CM | POA: Diagnosis not present

## 2016-08-07 DIAGNOSIS — E785 Hyperlipidemia, unspecified: Secondary | ICD-10-CM | POA: Diagnosis not present

## 2016-08-08 ENCOUNTER — Telehealth: Payer: Self-pay | Admitting: Internal Medicine

## 2016-08-08 ENCOUNTER — Ambulatory Visit (HOSPITAL_BASED_OUTPATIENT_CLINIC_OR_DEPARTMENT_OTHER): Payer: Medicare HMO | Admitting: Internal Medicine

## 2016-08-08 ENCOUNTER — Encounter: Payer: Self-pay | Admitting: Internal Medicine

## 2016-08-08 VITALS — BP 162/85 | HR 94 | Temp 98.2°F | Resp 18 | Ht 69.0 in | Wt 297.2 lb

## 2016-08-08 DIAGNOSIS — D890 Polyclonal hypergammaglobulinemia: Secondary | ICD-10-CM | POA: Diagnosis not present

## 2016-08-08 DIAGNOSIS — E8809 Other disorders of plasma-protein metabolism, not elsewhere classified: Secondary | ICD-10-CM | POA: Diagnosis not present

## 2016-08-08 DIAGNOSIS — I10 Essential (primary) hypertension: Secondary | ICD-10-CM

## 2016-08-08 DIAGNOSIS — D472 Monoclonal gammopathy: Secondary | ICD-10-CM

## 2016-08-08 NOTE — Progress Notes (Signed)
[place] [phone]) [phone]   [phone]  OFFICE PROGRESS NOTE  Wenda Low, MD [address] [place] [address]  DIAGNOSIS: Monoclonal gammopathy of undetermined significance  PRIOR THERAPY: None  CURRENT THERAPY: Observation.  INTERVAL HISTORY: Kristen English 61 y.o. female returns to the clinic today for her routine three-month follow-up visit. The patient is feeling fine today was no specific complaints. She denied having any fatigue or weakness. She denied having any weight loss or night sweats. She has no nausea, vomiting, diarrhea or constipation. She has no chest pain, shortness of breath, cough or hemoptysis. She is still undergoing evaluation for kidney transplant at [facility]. She is here today for evaluation with repeat myeloma panel.  MEDICAL HISTORY: Past Medical History:  Diagnosis Date  . Arthritis   . Chronic kidney disease    protein in urine  . Diabetes mellitus without complication ([place])   . Gout   . Hypertension     ALLERGIES:  is allergic to morphine and related.  MEDICATIONS:  Current Outpatient Prescriptions  Medication Sig Dispense Refill  . amLODipine (NORVASC) 10 MG tablet Take 10 mg by mouth at bedtime.     Marland Kitchen aspirin 81 MG tablet Take 81 mg by mouth at bedtime.     Marland Kitchen atorvastatin (LIPITOR) 40 MG tablet     . BOOSTRIX 5-2.5-18.5 injection     . carvedilol (COREG) 25 MG tablet Take 25 mg by mouth 2 (two) times daily with a meal.     . cholecalciferol (VITAMIN D) 1000 UNITS tablet Take 2,000 Units by mouth every morning.     . colchicine 0.6 MG tablet Take 0.6 mg by mouth every 12 (twelve) hours as needed (gout.).     Marland Kitchen ferrous sulfate (FERROUSUL) 325 (65 FE) MG tablet Take 325 mg by mouth.    . ferrous sulfate 325 (65 FE) MG tablet Take 325 mg by mouth daily with breakfast.    . furosemide (LASIX) 40 MG tablet Take 40 mg by mouth 2 (two) times daily as needed for fluid.    Marland Kitchen  glipiZIDE (GLUCOTROL) 5 MG tablet Take 5 mg by mouth 3 (three) times daily with meals.    . hydrOXYzine (ATARAX/VISTARIL) 25 MG tablet Take 25 mg by mouth at bedtime as needed for itching.     . Liraglutide (VICTOZA) 18 MG/3ML SOPN Inject 1.8 mg into the skin daily at 12 noon.     . lovastatin (MEVACOR) 10 MG tablet Take 10 mg by mouth at bedtime.     Marland Kitchen NOVOFINE 32G X 6 MM MISC      No current facility-administered medications for this visit.     SURGICAL HISTORY:  Past Surgical History:  Procedure Laterality Date  . COLONOSCOPY WITH PROPOFOL N/A 05/11/2014   Procedure: COLONOSCOPY WITH PROPOFOL;  Surgeon: Garlan Fair, MD;  Location: [facility];  Service: Endoscopy;  Laterality: N/A;  . FOOT SURGERY Left 2007  . JOINT REPLACEMENT  2009   bilateral knees  . TOTAL KNEE ARTHROPLASTY     bilateral    REVIEW OF SYSTEMS:  A comprehensive review of systems was negative.   PHYSICAL EXAMINATION: General appearance: alert, cooperative and no distress Head: Normocephalic, without obvious abnormality, atraumatic Neck: no adenopathy, no JVD, supple, symmetrical, trachea midline and thyroid not enlarged, symmetric, no tenderness/mass/nodules Lymph nodes: Cervical, supraclavicular, and axillary nodes normal. Resp: clear to auscultation bilaterally Back: symmetric, no curvature. ROM normal. No  CVA tenderness. Cardio: regular rate and rhythm, S1, S2 normal, no murmur, click, rub or gallop GI: soft, non-tender; bowel sounds normal; no masses,  no organomegaly Extremities: extremities normal, atraumatic, no cyanosis or edema  ECOG PERFORMANCE STATUS: 1 - Symptomatic but completely ambulatory  Blood pressure (!) 162/85, pulse 94, temperature 98.2 F (36.8 C), temperature source Oral, resp. rate 18, height 5\' 9"  (1.753 m), weight 297 lb 3.2 oz (134.8 kg), SpO2 100 %.   LABORATORY DATA: Lab Results  Component Value Date   WBC 4.3 08/01/2016   HGB 9.8 (L) 08/01/2016   HCT 30.7 (L)  08/01/2016   MCV 86.0 08/01/2016   PLT 202 08/01/2016      Chemistry      Component Value Date/Time   NA 145 08/01/2016 1045   K 4.2 08/01/2016 1045   CL 102 02/25/2008 0548   CO2 25 08/01/2016 1045   BUN 41.5 (H) 08/01/2016 1045   CREATININE 4.0 (HH) 08/01/2016 1045      Component Value Date/Time   CALCIUM 9.8 08/01/2016 1045   ALKPHOS 114 08/01/2016 1045   AST 22 08/01/2016 1045   ALT 18 08/01/2016 1045   BILITOT 0.37 08/01/2016 1045     Other lab results:   RADIOGRAPHIC STUDIES: No results found. 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 Her myeloma panel showed elevated free kappa light chain but no significant disease progression. I recommended for the patient to continue on observation for now. I will see her back for follow-up visit in 3 months for reevaluation with repeat myeloma panel. For hypertension, the patient was advised to take her blood pressure medication as prescribed and to consult with her nephrologist and primary care physician persisted to be elevated. She was advised to call immediately if she has any concerning symptoms in the interval. The patient voices understanding of current disease status and treatment options and is in agreement with the current care plan. All questions were answered. The patient knows to call the clinic with any problems, questions or concerns. We can certainly see the patient much sooner if necessary. I spent 10 minutes counseling the patient face to face. The total time spent in the appointment was 15 minutes. Disclaimer: This note was dictated with voice recognition software. Similar sounding words can inadvertently be transcribed and may not be corrected upon review.

## 2016-08-08 NOTE — Telephone Encounter (Signed)
Gave patient AVS and calender per 5/22 los - lab and f/u in 3 months.

## 2016-10-09 DIAGNOSIS — N186 End stage renal disease: Secondary | ICD-10-CM | POA: Diagnosis not present

## 2016-10-09 DIAGNOSIS — Z6841 Body Mass Index (BMI) 40.0 and over, adult: Secondary | ICD-10-CM | POA: Diagnosis not present

## 2016-10-09 DIAGNOSIS — N184 Chronic kidney disease, stage 4 (severe): Secondary | ICD-10-CM | POA: Diagnosis not present

## 2016-10-09 DIAGNOSIS — Z713 Dietary counseling and surveillance: Secondary | ICD-10-CM | POA: Diagnosis not present

## 2016-10-09 DIAGNOSIS — Z01818 Encounter for other preprocedural examination: Secondary | ICD-10-CM | POA: Diagnosis not present

## 2016-10-09 DIAGNOSIS — I129 Hypertensive chronic kidney disease with stage 1 through stage 4 chronic kidney disease, or unspecified chronic kidney disease: Secondary | ICD-10-CM | POA: Diagnosis not present

## 2016-10-09 DIAGNOSIS — E1122 Type 2 diabetes mellitus with diabetic chronic kidney disease: Secondary | ICD-10-CM | POA: Diagnosis not present

## 2016-10-09 DIAGNOSIS — E1101 Type 2 diabetes mellitus with hyperosmolarity with coma: Secondary | ICD-10-CM | POA: Diagnosis not present

## 2016-10-19 DIAGNOSIS — D472 Monoclonal gammopathy: Secondary | ICD-10-CM | POA: Diagnosis not present

## 2016-10-19 DIAGNOSIS — E782 Mixed hyperlipidemia: Secondary | ICD-10-CM | POA: Diagnosis not present

## 2016-10-19 DIAGNOSIS — N184 Chronic kidney disease, stage 4 (severe): Secondary | ICD-10-CM | POA: Diagnosis not present

## 2016-10-19 DIAGNOSIS — I10 Essential (primary) hypertension: Secondary | ICD-10-CM | POA: Diagnosis not present

## 2016-10-19 DIAGNOSIS — E1165 Type 2 diabetes mellitus with hyperglycemia: Secondary | ICD-10-CM | POA: Diagnosis not present

## 2016-10-19 DIAGNOSIS — E1122 Type 2 diabetes mellitus with diabetic chronic kidney disease: Secondary | ICD-10-CM | POA: Diagnosis not present

## 2016-10-19 DIAGNOSIS — M109 Gout, unspecified: Secondary | ICD-10-CM | POA: Diagnosis not present

## 2016-10-19 DIAGNOSIS — Z0000 Encounter for general adult medical examination without abnormal findings: Secondary | ICD-10-CM | POA: Diagnosis not present

## 2016-10-19 DIAGNOSIS — D649 Anemia, unspecified: Secondary | ICD-10-CM | POA: Diagnosis not present

## 2016-10-19 DIAGNOSIS — Z1389 Encounter for screening for other disorder: Secondary | ICD-10-CM | POA: Diagnosis not present

## 2016-11-01 ENCOUNTER — Other Ambulatory Visit (HOSPITAL_BASED_OUTPATIENT_CLINIC_OR_DEPARTMENT_OTHER): Payer: Medicare HMO

## 2016-11-01 DIAGNOSIS — D890 Polyclonal hypergammaglobulinemia: Principal | ICD-10-CM

## 2016-11-01 DIAGNOSIS — E8809 Other disorders of plasma-protein metabolism, not elsewhere classified: Secondary | ICD-10-CM | POA: Diagnosis not present

## 2016-11-01 DIAGNOSIS — D472 Monoclonal gammopathy: Secondary | ICD-10-CM

## 2016-11-01 LAB — COMPREHENSIVE METABOLIC PANEL
ALBUMIN: 2.7 g/dL — AB (ref 3.5–5.0)
ALK PHOS: 88 U/L (ref 40–150)
ALT: 18 U/L (ref 0–55)
AST: 22 U/L (ref 5–34)
Anion Gap: 8 mEq/L (ref 3–11)
BUN: 29.6 mg/dL — AB (ref 7.0–26.0)
CO2: 24 mEq/L (ref 22–29)
CREATININE: 4.1 mg/dL — AB (ref 0.6–1.1)
Calcium: 10.4 mg/dL (ref 8.4–10.4)
Chloride: 109 mEq/L (ref 98–109)
EGFR: 12 mL/min/{1.73_m2} — ABNORMAL LOW (ref >90)
GLUCOSE: 116 mg/dL (ref 70–140)
POTASSIUM: 4.6 meq/L (ref 3.5–5.1)
SODIUM: 141 meq/L (ref 136–145)
TOTAL PROTEIN: 7.2 g/dL (ref 6.4–8.3)
Total Bilirubin: 0.39 mg/dL (ref 0.20–1.20)

## 2016-11-01 LAB — CBC WITH DIFFERENTIAL/PLATELET
BASO%: 0.5 % (ref 0.0–2.0)
Basophils Absolute: 0 10*3/uL (ref 0.0–0.1)
EOS%: 2.7 % (ref 0.0–7.0)
Eosinophils Absolute: 0.1 10*3/uL (ref 0.0–0.5)
HCT: 32.3 % — ABNORMAL LOW (ref 34.8–46.6)
HEMOGLOBIN: 10.2 g/dL — AB (ref 11.6–15.9)
LYMPH#: 0.9 10*3/uL (ref 0.9–3.3)
LYMPH%: 23.5 % (ref 14.0–49.7)
MCH: 27.3 pg (ref 25.1–34.0)
MCHC: 31.5 g/dL (ref 31.5–36.0)
MCV: 86.7 fL (ref 79.5–101.0)
MONO#: 0.2 10*3/uL (ref 0.1–0.9)
MONO%: 6.4 % (ref 0.0–14.0)
NEUT%: 66.9 % (ref 38.4–76.8)
NEUTROS ABS: 2.5 10*3/uL (ref 1.5–6.5)
Platelets: 227 10*3/uL (ref 145–400)
RBC: 3.73 10*6/uL (ref 3.70–5.45)
RDW: 15.1 % — AB (ref 11.2–14.5)
WBC: 3.7 10*3/uL — AB (ref 3.9–10.3)

## 2016-11-01 LAB — LACTATE DEHYDROGENASE: LDH: 205 U/L (ref 125–245)

## 2016-11-02 LAB — KAPPA/LAMBDA LIGHT CHAINS
Ig Kappa Free Light Chain: 218.6 mg/L — ABNORMAL HIGH (ref 3.3–19.4)
Ig Lambda Free Light Chain: 99.6 mg/L — ABNORMAL HIGH (ref 5.7–26.3)
Kappa/Lambda FluidC Ratio: 2.19 — ABNORMAL HIGH (ref 0.26–1.65)

## 2016-11-02 LAB — IGG, IGA, IGM
IGM (IMMUNOGLOBIN M), SRM: 99 mg/dL (ref 26–217)
IgA, Qn, Serum: 418 mg/dL — ABNORMAL HIGH (ref 87–352)
IgG, Qn, Serum: 1204 mg/dL (ref 700–1600)

## 2016-11-02 LAB — BETA 2 MICROGLOBULIN, SERUM: Beta-2: 10.4 mg/L — ABNORMAL HIGH (ref 0.6–2.4)

## 2016-11-08 ENCOUNTER — Other Ambulatory Visit: Payer: Self-pay | Admitting: Internal Medicine

## 2016-11-08 ENCOUNTER — Ambulatory Visit (HOSPITAL_BASED_OUTPATIENT_CLINIC_OR_DEPARTMENT_OTHER): Payer: Medicare HMO | Admitting: Internal Medicine

## 2016-11-08 ENCOUNTER — Encounter: Payer: Self-pay | Admitting: Internal Medicine

## 2016-11-08 ENCOUNTER — Telehealth: Payer: Self-pay | Admitting: Internal Medicine

## 2016-11-08 VITALS — BP 169/93 | HR 93 | Temp 98.4°F | Resp 19 | Ht 69.0 in | Wt 290.2 lb

## 2016-11-08 DIAGNOSIS — D472 Monoclonal gammopathy: Secondary | ICD-10-CM | POA: Diagnosis not present

## 2016-11-08 DIAGNOSIS — E8809 Other disorders of plasma-protein metabolism, not elsewhere classified: Secondary | ICD-10-CM

## 2016-11-08 DIAGNOSIS — I10 Essential (primary) hypertension: Secondary | ICD-10-CM

## 2016-11-08 DIAGNOSIS — D890 Polyclonal hypergammaglobulinemia: Secondary | ICD-10-CM

## 2016-11-08 NOTE — Progress Notes (Signed)
[place] [phone]   Fax:([phone]) [phone]  OFFICE PROGRESS NOTE  Wenda Low, MD [address] [place] [address]  DIAGNOSIS: Monoclonal gammopathy of undetermined significance  PRIOR THERAPY: None  CURRENT THERAPY: Observation.  INTERVAL HISTORY: OLIVIANNA English 61 y.o. female returns to the clinic today for follow-up visit. The patient is feeling fine today with no specific complaints. She had a recent fall in one of the stores when she tripped in a sign. She denied having any chest pain, shortness of breath, cough or hemoptysis. She denied having any fever or chills. She has no nausea, vomiting, diarrhea or constipation. She is here today for evaluation after repeating myeloma panel.  MEDICAL HISTORY: Past Medical History:  Diagnosis Date  . Arthritis   . Chronic kidney disease    protein in urine  . Diabetes mellitus without complication ([place])   . Gout   . Hypertension     ALLERGIES:  is allergic to morphine and related.  MEDICATIONS:  Current Outpatient Prescriptions  Medication Sig Dispense Refill  . amLODipine (NORVASC) 10 MG tablet Take 10 mg by mouth at bedtime.     Marland Kitchen aspirin 81 MG tablet Take 81 mg by mouth at bedtime.     Marland Kitchen atorvastatin (LIPITOR) 40 MG tablet     . BOOSTRIX 5-2.5-18.5 injection     . carvedilol (COREG) 25 MG tablet Take 25 mg by mouth 2 (two) times daily with a meal.     . cholecalciferol (VITAMIN D) 1000 UNITS tablet Take 2,000 Units by mouth every morning.     . colchicine 0.6 MG tablet Take 0.6 mg by mouth every 12 (twelve) hours as needed (gout.).     Marland Kitchen ferrous sulfate (FERROUSUL) 325 (65 FE) MG tablet Take 325 mg by mouth.    . ferrous sulfate 325 (65 FE) MG tablet Take 325 mg by mouth daily with breakfast.    . furosemide (LASIX) 40 MG tablet Take 40 mg by mouth 2 (two) times daily as needed for fluid.    Marland Kitchen glipiZIDE (GLUCOTROL) 5 MG tablet Take 5 mg by mouth 3 (three) times daily  with meals.    . hydrOXYzine (ATARAX/VISTARIL) 25 MG tablet Take 25 mg by mouth at bedtime as needed for itching.     . Liraglutide (VICTOZA) 18 MG/3ML SOPN Inject 1.8 mg into the skin daily at 12 noon.     . lovastatin (MEVACOR) 10 MG tablet Take 10 mg by mouth at bedtime.     Marland Kitchen NOVOFINE 32G X 6 MM MISC      No current facility-administered medications for this visit.     SURGICAL HISTORY:  Past Surgical History:  Procedure Laterality Date  . COLONOSCOPY WITH PROPOFOL N/A 05/11/2014   Procedure: COLONOSCOPY WITH PROPOFOL;  Surgeon: Garlan Fair, MD;  Location: [facility];  Service: Endoscopy;  Laterality: N/A;  . FOOT SURGERY Left 2007  . JOINT REPLACEMENT  2009   bilateral knees  . TOTAL KNEE ARTHROPLASTY     bilateral    REVIEW OF SYSTEMS:  A comprehensive review of systems was negative except for: Constitutional: positive for fatigue   PHYSICAL EXAMINATION: General appearance: alert, cooperative, fatigued and no distress Head: Normocephalic, without obvious abnormality, atraumatic Neck: no adenopathy, no JVD, supple, symmetrical, trachea midline and thyroid not enlarged, symmetric, no tenderness/mass/nodules Lymph nodes: Cervical, supraclavicular, and axillary nodes normal. Resp: clear to auscultation bilaterally Back: symmetric, no curvature. ROM normal. No  CVA tenderness. Cardio: regular rate and rhythm, S1, S2 normal, no murmur, click, rub or gallop GI: soft, non-tender; bowel sounds normal; no masses,  no organomegaly Extremities: extremities normal, atraumatic, no cyanosis or edema  ECOG PERFORMANCE STATUS: 1 - Symptomatic but completely ambulatory  Blood pressure (!) 169/93, pulse 93, temperature 98.4 F (36.9 C), temperature source Oral, resp. rate 19, height '5\' 9"'$  (1.753 m), weight 290 lb 3.2 oz (131.6 kg), SpO2 100 %.   LABORATORY DATA: Lab Results  Component Value Date   WBC 3.7 (L) 11/01/2016   HGB 10.2 (L) 11/01/2016   HCT 32.3 (L) 11/01/2016   MCV  86.7 11/01/2016   PLT 227 11/01/2016      Chemistry      Component Value Date/Time   NA 141 11/01/2016 1029   K 4.6 11/01/2016 1029   CL 102 02/25/2008 0548   CO2 24 11/01/2016 1029   BUN 29.6 (H) 11/01/2016 1029   CREATININE 4.1 (HH) 11/01/2016 1029      Component Value Date/Time   CALCIUM 10.4 11/01/2016 1029   ALKPHOS 88 11/01/2016 1029   AST 22 11/01/2016 1029   ALT 18 11/01/2016 1029   BILITOT 0.39 11/01/2016 1029     RADIOGRAPHIC STUDIES: No results found. 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 The rest myeloma panel showed further increase in the serum free Kappa Light Chain. I discussed the lab result with the patient today. I recommended for her to have repeat CT guided bone marrow biopsy and aspirate for further evaluation of her disease and to rule out development of multiple myeloma. I will see him back for follow-up visit in 2-3 weeks for reevaluation and discussion of her biopsy results and recommendation regarding her condition. For hypertension, the patient was advised to take her blood pressure medication as prescribed and to consult with her nephrologist and primary care physician persisted to be elevated. She was advised to call immediately if she has any concerning symptoms in the interval. The patient voices understanding of current disease status and treatment options and is in agreement with the current care plan. All questions were answered. The patient knows to call the clinic with any problems, questions or concerns. We can certainly see the patient much sooner if necessary. I spent 10 minutes counseling the patient face to face. The total time spent in the appointment was 15 minutes. Disclaimer: This note was dictated with voice recognition software. Similar sounding  words can inadvertently be transcribed and may not be corrected upon review.

## 2016-11-08 NOTE — Telephone Encounter (Signed)
Gave pt avs and calendars with their upcoming appts.

## 2016-11-14 ENCOUNTER — Other Ambulatory Visit: Payer: Self-pay | Admitting: Student

## 2016-11-14 ENCOUNTER — Other Ambulatory Visit: Payer: Self-pay | Admitting: General Surgery

## 2016-11-15 ENCOUNTER — Ambulatory Visit (HOSPITAL_COMMUNITY)
Admission: RE | Admit: 2016-11-15 | Discharge: 2016-11-15 | Disposition: A | Discharge status: Home / Self Care | Payer: Medicare HMO | Source: Ambulatory Visit | Attending: Internal Medicine | Admitting: Internal Medicine

## 2016-11-15 ENCOUNTER — Encounter (HOSPITAL_COMMUNITY): Payer: Self-pay

## 2016-11-15 DIAGNOSIS — M109 Gout, unspecified: Secondary | ICD-10-CM | POA: Diagnosis not present

## 2016-11-15 DIAGNOSIS — D472 Monoclonal gammopathy: Secondary | ICD-10-CM | POA: Diagnosis not present

## 2016-11-15 DIAGNOSIS — Z7984 Long term (current) use of oral hypoglycemic drugs: Secondary | ICD-10-CM | POA: Insufficient documentation

## 2016-11-15 DIAGNOSIS — D649 Anemia, unspecified: Secondary | ICD-10-CM | POA: Diagnosis not present

## 2016-11-15 DIAGNOSIS — D890 Polyclonal hypergammaglobulinemia: Secondary | ICD-10-CM | POA: Diagnosis present

## 2016-11-15 DIAGNOSIS — I129 Hypertensive chronic kidney disease with stage 1 through stage 4 chronic kidney disease, or unspecified chronic kidney disease: Secondary | ICD-10-CM | POA: Diagnosis not present

## 2016-11-15 DIAGNOSIS — N189 Chronic kidney disease, unspecified: Secondary | ICD-10-CM | POA: Insufficient documentation

## 2016-11-15 DIAGNOSIS — Z888 Allergy status to other drugs, medicaments and biological substances status: Secondary | ICD-10-CM | POA: Insufficient documentation

## 2016-11-15 DIAGNOSIS — E1122 Type 2 diabetes mellitus with diabetic chronic kidney disease: Secondary | ICD-10-CM | POA: Diagnosis not present

## 2016-11-15 DIAGNOSIS — D72822 Plasmacytosis: Secondary | ICD-10-CM | POA: Diagnosis not present

## 2016-11-15 DIAGNOSIS — Z79899 Other long term (current) drug therapy: Secondary | ICD-10-CM | POA: Diagnosis not present

## 2016-11-15 DIAGNOSIS — Z96653 Presence of artificial knee joint, bilateral: Secondary | ICD-10-CM | POA: Diagnosis not present

## 2016-11-15 DIAGNOSIS — Z7982 Long term (current) use of aspirin: Secondary | ICD-10-CM | POA: Diagnosis not present

## 2016-11-15 DIAGNOSIS — E8809 Other disorders of plasma-protein metabolism, not elsewhere classified: Secondary | ICD-10-CM

## 2016-11-15 LAB — GLUCOSE, CAPILLARY: GLUCOSE-CAPILLARY: 132 mg/dL — AB (ref 65–99)

## 2016-11-15 LAB — APTT: APTT: 30 s (ref 24–36)

## 2016-11-15 LAB — CBC
HEMATOCRIT: 29.8 % — AB (ref 36.0–46.0)
Hemoglobin: 9.5 g/dL — ABNORMAL LOW (ref 12.0–15.0)
MCH: 27.1 pg (ref 26.0–34.0)
MCHC: 31.9 g/dL (ref 30.0–36.0)
MCV: 84.9 fL (ref 78.0–100.0)
PLATELETS: 205 10*3/uL (ref 150–400)
RBC: 3.51 MIL/uL — ABNORMAL LOW (ref 3.87–5.11)
RDW: 15.2 % (ref 11.5–15.5)
WBC: 5 10*3/uL (ref 4.0–10.5)

## 2016-11-15 LAB — PROTIME-INR
INR: 0.95
PROTHROMBIN TIME: 12.5 s (ref 11.4–15.2)

## 2016-11-15 MED ORDER — MIDAZOLAM HCL 2 MG/2ML IJ SOLN
INTRAMUSCULAR | Status: AC
Start: 2016-11-15 — End: 2016-11-15
  Filled 2016-11-15: qty 4

## 2016-11-15 MED ORDER — SODIUM CHLORIDE 0.9 % IV SOLN
INTRAVENOUS | Status: DC
  Administered 2016-11-15: 08:00:00 via INTRAVENOUS

## 2016-11-15 MED ORDER — MIDAZOLAM HCL 2 MG/2ML IJ SOLN
INTRAMUSCULAR | Status: AC | PRN
  Administered 2016-11-15 (×2): 1 mg via INTRAVENOUS

## 2016-11-15 MED ORDER — FENTANYL CITRATE (PF) 100 MCG/2ML IJ SOLN
INTRAMUSCULAR | Status: AC | PRN
  Administered 2016-11-15 (×2): 50 ug via INTRAVENOUS

## 2016-11-15 MED ORDER — FENTANYL CITRATE (PF) 100 MCG/2ML IJ SOLN
INTRAMUSCULAR | Status: AC
  Filled 2016-11-15: qty 4

## 2016-11-15 NOTE — Sedation Documentation (Signed)
Patient denies pain and is resting comfortably.  

## 2016-11-15 NOTE — Discharge Instructions (Signed)
Bone Marrow Aspiration and Bone Marrow Biopsy, Adult, Care After °This sheet gives you information about how to care for yourself after your procedure. Your health care provider may also give you more specific instructions. If you have problems or questions, contact your health care provider. °What can I expect after the procedure? °After the procedure, it is common to have: °· Mild pain and tenderness. °· Swelling. °· Bruising. ° °Follow these instructions at home: °· Take over-the-counter or prescription medicines only as told by your health care provider. °· Do not take baths, swim, or use a hot tub until your health care provider approves. Ask if you can take a shower or have a sponge bath. °· Follow instructions from your health care provider about how to take care of the puncture site. Make sure you: °? Wash your hands with soap and water before you change your bandage (dressing). If soap and water are not available, use hand sanitizer. °? Change your dressing as told by your health care provider. °· Check your puncture site every day for signs of infection. Check for: °? More redness, swelling, or pain. °? More fluid or blood. °? Warmth. °? Pus or a bad smell. °· Return to your normal activities as told by your health care provider. Ask your health care provider what activities are safe for you. °· Do not drive for 24 hours if you were given a medicine to help you relax (sedative). °· Keep all follow-up visits as told by your health care provider. This is important. °Contact a health care provider if: °· You have more redness, swelling, or pain around the puncture site. °· You have more fluid or blood coming from the puncture site. °· Your puncture site feels warm to the touch. °· You have pus or a bad smell coming from the puncture site. °· You have a fever. °· Your pain is not controlled with medicine. °This information is not intended to replace advice given to you by your health care provider. Make sure  you discuss any questions you have with your health care provider. °Document Released: 09/23/2004 Document Revised: 09/24/2015 Document Reviewed: 08/18/2015 °Elsevier Interactive Patient Education © 2018 Elsevier Inc[employer] °Moderate Conscious Sedation, Adult, Care After °These instructions provide you with information about caring for yourself after your procedure. Your health care provider may also give you more specific instructions. Your treatment has been planned according to current medical practices, but problems sometimes occur. Call your health care provider if you have any problems or questions after your procedure. °What can I expect after the procedure? °After your procedure, it is common: °· To feel sleepy for several hours. °· To feel clumsy and have poor balance for several hours. °· To have poor judgment for several hours. °· To vomit if you eat too soon. ° °Follow these instructions at home: °For at least 24 hours after the procedure: ° °· Do not: °? Participate in activities where you could fall or become injured. °? Drive. °? Use heavy machinery. °? Drink alcohol. °? Take sleeping pills or medicines that cause drowsiness. °? Make important decisions or sign legal documents. °? Take care of children on your own. °· Rest. °Eating and drinking °· Follow the diet recommended by your health care provider. °· If you vomit: °? Drink water, juice, or soup when you can drink without vomiting. °? Make sure you have little or no nausea before eating solid foods. °General instructions °· Have a responsible adult stay with you until you are   awake and alert.  Take over-the-counter and prescription medicines only as told by your health care provider.  If you smoke, do not smoke without supervision.  Keep all follow-up visits as told by your health care provider. This is important. Contact a health care provider if:  You keep feeling nauseous or you keep vomiting.  You feel light-headed.  You develop a  rash.  You have a fever. Get help right away if:  You have trouble breathing. This information is not intended to replace advice given to you by your health care provider. Make sure you discuss any questions you have with your health care provider. Document Released: 12/25/2012 Document Revised: 08/09/2015 Document Reviewed: 06/26/2015 Elsevier Interactive Patient Education  [employer].

## 2016-11-15 NOTE — H&P (Signed)
Referring Physician(s): Mohamed,Mohamed  Supervising Physician: Aletta Edouard  Patient Status:  [facility]  Chief Complaint:  "I'm having another bone marrow biopsy"  Subjective: Patient familiar to IR service from prior bone marrow biopsies in 2014 and 2015. She has a history of MGUS with recent myeloma panel showing further increase in serum free kappa light chain. She presents again today for CT-guided bone marrow biopsy to rule out myeloma. She currently denies fever, headache, chest pain, dyspnea, cough, abdominal/back pain, nausea, vomiting or abnormal bleeding. Past Medical History:  Diagnosis Date  . Arthritis   . Chronic kidney disease    protein in urine  . Diabetes mellitus without complication ([place])   . Gout   . Hypertension    Past Surgical History:  Procedure Laterality Date  . COLONOSCOPY WITH PROPOFOL N/A 05/11/2014   Procedure: COLONOSCOPY WITH PROPOFOL;  Surgeon: Garlan Fair, MD;  Location: [facility];  Service: Endoscopy;  Laterality: N/A;  . FOOT SURGERY Left 2007  . JOINT REPLACEMENT  2009   bilateral knees  . TOTAL KNEE ARTHROPLASTY     bilateral      Allergies: Morphine and related  Medications: Prior to Admission medications   Medication Sig Start Date End Date Taking? Authorizing Provider  amLODipine (NORVASC) 10 MG tablet Take 10 mg by mouth at bedtime.    Yes [provider]  aspirin 81 MG tablet Take 81 mg by mouth at bedtime.    Yes [provider]  atorvastatin (LIPITOR) 40 MG tablet  11/01/15  Yes [provider]  carvedilol (COREG) 25 MG tablet Take 25 mg by mouth 2 (two) times daily with a meal.  02/24/13  Yes [provider]  cholecalciferol (VITAMIN D) 1000 UNITS tablet Take 2,000 Units by mouth every morning.    Yes [provider]  colchicine 0.6 MG tablet Take 0.6 mg by mouth every 12 (twelve) hours as needed (gout.).    Yes [provider]  ferrous sulfate (FERROUSUL) 325  (65 FE) MG tablet Take 325 mg by mouth.   Yes [provider]  furosemide (LASIX) 40 MG tablet Take 40 mg by mouth 2 (two) times daily as needed for fluid.   Yes [provider]  glipiZIDE (GLUCOTROL) 5 MG tablet Take 5 mg by mouth 3 (three) times daily with meals.   Yes [provider]  hydrOXYzine (ATARAX/VISTARIL) 25 MG tablet Take 25 mg by mouth at bedtime as needed for itching.    Yes [provider]  Liraglutide (VICTOZA) 18 MG/3ML SOPN Inject 1.8 mg into the skin daily at 12 noon.    Yes [provider]  lovastatin (MEVACOR) 10 MG tablet Take 10 mg by mouth at bedtime.  03/19/13  Yes [provider]  NOVOFINE 32G X 6 MM MISC  08/07/14  Yes [provider]  [place] 5-2.5-18.5 injection  09/30/15   [provider]  ferrous sulfate 325 (65 FE) MG tablet Take 325 mg by mouth daily with breakfast.    [provider]     Vital Signs: BP (!) 172/91   Pulse 87   Temp 98.2 F (36.8 C) (Oral)   Resp 16   Ht '5\' 9"'$  (1.753 m)   Wt 288 lb (130.6 kg)   SpO2 98%   BMI 42.53 kg/m   Physical Exam awake, alert. Chest with distant breath sounds bilaterally. Heart with regular rate and rhythm. Abdomen obese, soft, positive bowel sounds, nontender. Trace bilateral lower extremity edema.  Imaging: No results found.  Labs:  CBC:  Recent Labs  04/27/16 1104 08/01/16 1045 11/01/16 1029 11/15/16 0728  WBC 4.5 4.3 3.7* 5.0  HGB 10.3* 9.8* 10.2* 9.5*  HCT 33.0* 30.7* 32.3* 29.8*  PLT 174 202 227 205    COAGS:  Recent Labs  11/15/16 0728  INR 0.95  APTT 30    BMP:  Recent Labs  01/27/16 1136 04/27/16 1104 08/01/16 1045 11/01/16 1029  NA 144 142 145 141  K 4.2 4.0 4.2 4.6  CO2 21* '26 25 24  '$ GLUCOSE 100 124 101 116  BUN 34.5* 32.7* 41.5* 29.6*  CALCIUM 9.6 9.9 9.8 10.4  CREATININE 3.6* 3.4* 4.0* 4.1*    LIVER FUNCTION TESTS:  Recent Labs  01/27/16 1136 04/27/16 1104 08/01/16 1045  11/01/16 1029  BILITOT 0.40 0.35 0.37 0.39  AST '23 22 22 22  '$ ALT '18 21 18 18  '$ ALKPHOS 95 131 114 88  PROT 6.8 7.2 6.8 7.2  ALBUMIN 2.7* 2.9* 2.9* 2.7*    Assessment and Plan: Pt with  history of MGUS and recent myeloma panel showing further increase in serum free kappa light chain. She presents today for CT-guided bone marrow biopsy to rule out myeloma. Risks and benefits discussed with the patient including, but not limited to bleeding, infection, damage to adjacent structures or low yield requiring additional tests.All of the patient's questions were answered, patient is agreeable to proceed.Consent signed and in chart.     Electronically Signed: D. Rowe Robert, PA-C 11/15/2016, 8:24 AM   I spent a total of 20 minutes at the the patient's bedside AND on the patient's hospital floor or unit, greater than 50% of which was counseling/coordinating care for CT-guided bone marrow biopsy

## 2016-11-15 NOTE — Procedures (Signed)
Interventional Radiology Procedure Note  Procedure: CT guided aspirate and core biopsy of right iliac bone Complications: None Recommendations: - Bedrest supine x 1 hr - Follow biopsy results  Aviela Blundell T. Daud Cayer, M.D Pager:  [phone]   

## 2016-11-21 ENCOUNTER — Other Ambulatory Visit: Payer: Self-pay | Admitting: *Deleted

## 2016-11-21 DIAGNOSIS — D890 Polyclonal hypergammaglobulinemia: Principal | ICD-10-CM

## 2016-11-21 DIAGNOSIS — D472 Monoclonal gammopathy: Secondary | ICD-10-CM

## 2016-11-22 ENCOUNTER — Other Ambulatory Visit (HOSPITAL_BASED_OUTPATIENT_CLINIC_OR_DEPARTMENT_OTHER): Payer: Medicare HMO

## 2016-11-22 ENCOUNTER — Ambulatory Visit (HOSPITAL_BASED_OUTPATIENT_CLINIC_OR_DEPARTMENT_OTHER): Payer: Medicare HMO | Admitting: Internal Medicine

## 2016-11-22 ENCOUNTER — Encounter: Payer: Self-pay | Admitting: Internal Medicine

## 2016-11-22 ENCOUNTER — Telehealth: Payer: Self-pay | Admitting: Internal Medicine

## 2016-11-22 VITALS — BP 159/82 | HR 95 | Temp 98.4°F | Resp 18 | Wt 291.9 lb

## 2016-11-22 DIAGNOSIS — D472 Monoclonal gammopathy: Secondary | ICD-10-CM

## 2016-11-22 DIAGNOSIS — D890 Polyclonal hypergammaglobulinemia: Principal | ICD-10-CM

## 2016-11-22 DIAGNOSIS — E8809 Other disorders of plasma-protein metabolism, not elsewhere classified: Secondary | ICD-10-CM

## 2016-11-22 LAB — COMPREHENSIVE METABOLIC PANEL
ALT: 14 U/L (ref 0–55)
ANION GAP: 8 meq/L (ref 3–11)
AST: 20 U/L (ref 5–34)
Albumin: 2.8 g/dL — ABNORMAL LOW (ref 3.5–5.0)
Alkaline Phosphatase: 92 U/L (ref 40–150)
BUN: 33 mg/dL — ABNORMAL HIGH (ref 7.0–26.0)
CHLORIDE: 111 meq/L — AB (ref 98–109)
CO2: 23 meq/L (ref 22–29)
CREATININE: 3.8 mg/dL — AB (ref 0.6–1.1)
Calcium: 10.4 mg/dL (ref 8.4–10.4)
EGFR: 14 mL/min/{1.73_m2} — AB (ref >90)
Glucose: 100 mg/dl (ref 70–140)
POTASSIUM: 4.2 meq/L (ref 3.5–5.1)
Sodium: 141 mEq/L (ref 136–145)
Total Bilirubin: 0.43 mg/dL (ref 0.20–1.20)
Total Protein: 7.1 g/dL (ref 6.4–8.3)

## 2016-11-22 LAB — CBC WITH DIFFERENTIAL/PLATELET
BASO%: 0.2 % (ref 0.0–2.0)
BASOS ABS: 0 10*3/uL (ref 0.0–0.1)
EOS ABS: 0.1 10*3/uL (ref 0.0–0.5)
EOS%: 2.7 % (ref 0.0–7.0)
HEMATOCRIT: 32.1 % — AB (ref 34.8–46.6)
HGB: 9.8 g/dL — ABNORMAL LOW (ref 11.6–15.9)
LYMPH#: 0.9 10*3/uL (ref 0.9–3.3)
LYMPH%: 21.5 % (ref 14.0–49.7)
MCH: 27 pg (ref 25.1–34.0)
MCHC: 30.5 g/dL — ABNORMAL LOW (ref 31.5–36.0)
MCV: 88.4 fL (ref 79.5–101.0)
MONO#: 0.2 10*3/uL (ref 0.1–0.9)
MONO%: 5.3 % (ref 0.0–14.0)
NEUT#: 3.1 10*3/uL (ref 1.5–6.5)
NEUT%: 70.3 % (ref 38.4–76.8)
PLATELETS: 166 10*3/uL (ref 145–400)
RBC: 3.63 10*6/uL — ABNORMAL LOW (ref 3.70–5.45)
RDW: 15.2 % — ABNORMAL HIGH (ref 11.2–14.5)
WBC: 4.4 10*3/uL (ref 3.9–10.3)

## 2016-11-22 NOTE — Progress Notes (Signed)
[place] [phone]   Fax:(336) [phone]  OFFICE PROGRESS NOTE  Wenda Low, MD [address] [place] [address]  DIAGNOSIS: Monoclonal gammopathy of undetermined significance  PRIOR THERAPY: None  CURRENT THERAPY: Observation.  INTERVAL HISTORY: Kristen English 61 y.o. female returns to the clinic today for follow-up visit. The patient is feeling fine today was no specific complaints except for mild fatigue. She denied having any current chest pain, shortness breath, cough or hemoptysis. She denied having any fever or chills. She has no nausea, vomiting, diarrhea or constipation. She recently underwent bone marrow biopsy and aspirate and she is here for evaluation and discussion of her biopsy results and treatment options.  MEDICAL HISTORY: Past Medical History:  Diagnosis Date  . Arthritis   . Chronic kidney disease    protein in urine  . Diabetes mellitus without complication ([place])   . Gout   . Hypertension     ALLERGIES:  is allergic to morphine and related.  MEDICATIONS:  Current Outpatient Prescriptions  Medication Sig Dispense Refill  . amLODipine (NORVASC) 10 MG tablet Take 10 mg by mouth at bedtime.     Marland Kitchen aspirin 81 MG tablet Take 81 mg by mouth at bedtime.     Marland Kitchen atorvastatin (LIPITOR) 40 MG tablet     . BOOSTRIX 5-2.5-18.5 injection     . carvedilol (COREG) 25 MG tablet Take 25 mg by mouth 2 (two) times daily with a meal.     . cholecalciferol (VITAMIN D) 1000 UNITS tablet Take 2,000 Units by mouth every morning.     . colchicine 0.6 MG tablet Take 0.6 mg by mouth every 12 (twelve) hours as needed (gout.).     Marland Kitchen ferrous sulfate (FERROUSUL) 325 (65 FE) MG tablet Take 325 mg by mouth.    . ferrous sulfate 325 (65 FE) MG tablet Take 325 mg by mouth daily with breakfast.    . furosemide (LASIX) 40 MG tablet Take 40 mg by mouth 2 (two) times daily as needed for fluid.    Marland Kitchen glipiZIDE (GLUCOTROL) 5 MG tablet  Take 5 mg by mouth 3 (three) times daily with meals.    . hydrOXYzine (ATARAX/VISTARIL) 25 MG tablet Take 25 mg by mouth at bedtime as needed for itching.     . Liraglutide (VICTOZA) 18 MG/3ML SOPN Inject 1.8 mg into the skin daily at 12 noon.     . lovastatin (MEVACOR) 10 MG tablet Take 10 mg by mouth at bedtime.     Marland Kitchen NOVOFINE 32G X 6 MM MISC      No current facility-administered medications for this visit.     SURGICAL HISTORY:  Past Surgical History:  Procedure Laterality Date  . COLONOSCOPY WITH PROPOFOL N/A 05/11/2014   Procedure: COLONOSCOPY WITH PROPOFOL;  Surgeon: Garlan Fair, MD;  Location: [facility];  Service: Endoscopy;  Laterality: N/A;  . FOOT SURGERY Left 2007  . JOINT REPLACEMENT  2009   bilateral knees  . TOTAL KNEE ARTHROPLASTY     bilateral    REVIEW OF SYSTEMS:  A comprehensive review of systems was negative except for: Constitutional: positive for fatigue   PHYSICAL EXAMINATION: General appearance: alert, cooperative, fatigued and no distress Head: Normocephalic, without obvious abnormality, atraumatic Neck: no adenopathy, no JVD, supple, symmetrical, trachea midline and thyroid not enlarged, symmetric, no tenderness/mass/nodules Lymph nodes: Cervical, supraclavicular, and axillary nodes normal. Resp: clear to auscultation bilaterally Back: symmetric, no curvature. ROM normal.  No CVA tenderness. Cardio: regular rate and rhythm, S1, S2 normal, no murmur, click, rub or gallop GI: soft, non-tender; bowel sounds normal; no masses,  no organomegaly Extremities: extremities normal, atraumatic, no cyanosis or edema  ECOG PERFORMANCE STATUS: 1 - Symptomatic but completely ambulatory  Blood pressure (!) 159/82, pulse 95, temperature 98.4 F (36.9 C), temperature source Oral, resp. rate 18, weight 291 lb 14.4 oz (132.4 kg), SpO2 99 %.   LABORATORY DATA: Lab Results  Component Value Date   WBC 4.4 11/22/2016   HGB 9.8 (L) 11/22/2016   HCT 32.1 (L)  11/22/2016   MCV 88.4 11/22/2016   PLT 166 11/22/2016      Chemistry      Component Value Date/Time   NA 141 11/01/2016 1029   K 4.6 11/01/2016 1029   CL 102 02/25/2008 0548   CO2 24 11/01/2016 1029   BUN 29.6 (H) 11/01/2016 1029   CREATININE 4.1 (HH) 11/01/2016 1029      Component Value Date/Time   CALCIUM 10.4 11/01/2016 1029   ALKPHOS 88 11/01/2016 1029   AST 22 11/01/2016 1029   ALT 18 11/01/2016 1029   BILITOT 0.39 11/01/2016 1029     RADIOGRAPHIC STUDIES: Ct Biopsy  Result Date: 12/10/16 CLINICAL DATA:  History of monoclonal gammopathy with increased cm kappa light chain. Bone marrow biopsy needed to rule out myeloma. Prior bone marrow biopsy in 2015 and 2014. EXAM: CT GUIDED BONE MARROW ASPIRATION AND BIOPSY ANESTHESIA/SEDATION: Versed 2.0 mg IV, Fentanyl 100 mcg IV Total Moderate Sedation Time:  16 minutes. The patient's level of consciousness and physiologic status were continuously monitored during the procedure by Radiology nursing. PROCEDURE: The procedure risks, benefits, and alternatives were explained to the patient. Questions regarding the procedure were encouraged and answered. The patient understands and consents to the procedure. A time out was performed prior to initiating the procedure. The right gluteal region was prepped with chlorhexidine. Sterile gown and sterile gloves were used for the procedure. Local anesthesia was provided with 1% Lidocaine. Under CT guidance, an 11 gauge On Control bone cutting needle was advanced from a posterior approach into the right iliac bone. Needle positioning was confirmed with CT. Initial non heparinized and heparinized aspirate samples were obtained of bone marrow. Core biopsy was performed via the On Control drill needle. COMPLICATIONS: None FINDINGS: Inspection of initial aspirate did reveal visible particles. Intact core biopsy sample was obtained. IMPRESSION: CT guided bone marrow biopsy of right posterior iliac bone with both  aspirate and core samples obtained. Electronically Signed   By: Aletta Edouard M.D.   On: 2016/12/10 10:10   Ct Bone Marrow Biopsy & Aspiration  Result Date: 12-10-16 CLINICAL DATA:  History of monoclonal gammopathy with increased cm kappa light chain. Bone marrow biopsy needed to rule out myeloma. Prior bone marrow biopsy in 2015 and 2014. EXAM: CT GUIDED BONE MARROW ASPIRATION AND BIOPSY ANESTHESIA/SEDATION: Versed 2.0 mg IV, Fentanyl 100 mcg IV Total Moderate Sedation Time:  16 minutes. The patient's level of consciousness and physiologic status were continuously monitored during the procedure by Radiology nursing. PROCEDURE: The procedure risks, benefits, and alternatives were explained to the patient. Questions regarding the procedure were encouraged and answered. The patient understands and consents to the procedure. A time out was performed prior to initiating the procedure. The right gluteal region was prepped with chlorhexidine. Sterile gown and sterile gloves were used for the procedure. Local anesthesia was provided with 1% Lidocaine. Under CT guidance, an 11 gauge On Control  bone cutting needle was advanced from a posterior approach into the right iliac bone. Needle positioning was confirmed with CT. Initial non heparinized and heparinized aspirate samples were obtained of bone marrow. Core biopsy was performed via the On Control drill needle. COMPLICATIONS: None FINDINGS: Inspection of initial aspirate did reveal visible particles. Intact core biopsy sample was obtained. IMPRESSION: CT guided bone marrow biopsy of right posterior iliac bone with both aspirate and core samples obtained. Electronically Signed   By: Aletta Edouard M.D.   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 The rest  myeloma panel showed further increase in the serum free Kappa Light Chain. The patient underwent CT-guided bone marrow biopsy and aspirate and the final pathology showed only 4% plasma cells with no acute finding to support a diagnosis of multiple myeloma. I discussed the biopsy results with the patient today. I recommended for her to continue on observation with repeat myeloma panel in 3 months. She was advised to call immediately if she has any concerning symptoms in the interval. The patient voices understanding of current disease status and treatment options and is in agreement with the current care plan. All questions were answered. The patient knows to call the clinic with any problems, questions or concerns. We can certainly see the patient much sooner if necessary. I spent 10 minutes counseling the patient face to face. The total time spent in the appointment was 15 minutes. Disclaimer: This note was dictated with voice recognition software. Similar sounding words can inadvertently be transcribed and may not be corrected upon review.

## 2016-11-22 NOTE — Telephone Encounter (Signed)
Gave patient avs report and appointments November and December.

## 2016-11-30 DIAGNOSIS — N183 Chronic kidney disease, stage 3 (moderate): Secondary | ICD-10-CM | POA: Diagnosis not present

## 2016-11-30 DIAGNOSIS — Z6841 Body Mass Index (BMI) 40.0 and over, adult: Secondary | ICD-10-CM | POA: Diagnosis not present

## 2016-11-30 DIAGNOSIS — M109 Gout, unspecified: Secondary | ICD-10-CM | POA: Diagnosis not present

## 2016-11-30 DIAGNOSIS — E785 Hyperlipidemia, unspecified: Secondary | ICD-10-CM | POA: Diagnosis not present

## 2016-11-30 DIAGNOSIS — I10 Essential (primary) hypertension: Secondary | ICD-10-CM | POA: Diagnosis not present

## 2016-11-30 DIAGNOSIS — E119 Type 2 diabetes mellitus without complications: Secondary | ICD-10-CM | POA: Diagnosis not present

## 2016-12-11 ENCOUNTER — Encounter (HOSPITAL_COMMUNITY): Payer: Self-pay

## 2016-12-15 LAB — CHROMOSOME ANALYSIS, BONE MARROW

## 2016-12-18 DIAGNOSIS — N183 Chronic kidney disease, stage 3 (moderate): Secondary | ICD-10-CM | POA: Diagnosis not present

## 2017-02-14 ENCOUNTER — Other Ambulatory Visit (HOSPITAL_BASED_OUTPATIENT_CLINIC_OR_DEPARTMENT_OTHER): Payer: Medicare HMO

## 2017-02-14 DIAGNOSIS — D472 Monoclonal gammopathy: Secondary | ICD-10-CM

## 2017-02-14 DIAGNOSIS — E8809 Other disorders of plasma-protein metabolism, not elsewhere classified: Secondary | ICD-10-CM

## 2017-02-14 LAB — COMPREHENSIVE METABOLIC PANEL
ALT: 15 U/L (ref 0–55)
ANION GAP: 7 meq/L (ref 3–11)
AST: 21 U/L (ref 5–34)
Albumin: 2.9 g/dL — ABNORMAL LOW (ref 3.5–5.0)
Alkaline Phosphatase: 108 U/L (ref 40–150)
BUN: 43.2 mg/dL — ABNORMAL HIGH (ref 7.0–26.0)
CALCIUM: 9.9 mg/dL (ref 8.4–10.4)
CHLORIDE: 111 meq/L — AB (ref 98–109)
CO2: 24 mEq/L (ref 22–29)
Creatinine: 4.2 mg/dL (ref 0.6–1.1)
EGFR: 12 mL/min/{1.73_m2} — ABNORMAL LOW (ref >60)
Glucose: 95 mg/dl (ref 70–140)
POTASSIUM: 4.4 meq/L (ref 3.5–5.1)
Sodium: 142 mEq/L (ref 136–145)
Total Bilirubin: 0.33 mg/dL (ref 0.20–1.20)
Total Protein: 7 g/dL (ref 6.4–8.3)

## 2017-02-14 LAB — CBC WITH DIFFERENTIAL/PLATELET
BASO%: 0.6 % (ref 0.0–2.0)
Basophils Absolute: 0 10*3/uL (ref 0.0–0.1)
EOS ABS: 0.1 10*3/uL (ref 0.0–0.5)
EOS%: 2.7 % (ref 0.0–7.0)
HCT: 31.7 % — ABNORMAL LOW (ref 34.8–46.6)
HEMOGLOBIN: 9.9 g/dL — AB (ref 11.6–15.9)
LYMPH#: 1 10*3/uL (ref 0.9–3.3)
LYMPH%: 21.1 % (ref 14.0–49.7)
MCH: 27 pg (ref 25.1–34.0)
MCHC: 31.3 g/dL — ABNORMAL LOW (ref 31.5–36.0)
MCV: 86.3 fL (ref 79.5–101.0)
MONO#: 0.3 10*3/uL (ref 0.1–0.9)
MONO%: 6.2 % (ref 0.0–14.0)
NEUT%: 69.4 % (ref 38.4–76.8)
NEUTROS ABS: 3.2 10*3/uL (ref 1.5–6.5)
PLATELETS: 195 10*3/uL (ref 145–400)
RBC: 3.68 10*6/uL — ABNORMAL LOW (ref 3.70–5.45)
RDW: 15.6 % — AB (ref 11.2–14.5)
WBC: 4.6 10*3/uL (ref 3.9–10.3)

## 2017-02-14 LAB — LACTATE DEHYDROGENASE: LDH: 216 U/L (ref 125–245)

## 2017-02-15 LAB — KAPPA/LAMBDA LIGHT CHAINS
IG KAPPA FREE LIGHT CHAIN: 166.8 mg/L — AB (ref 3.3–19.4)
IG LAMBDA FREE LIGHT CHAIN: 87.7 mg/L — AB (ref 5.7–26.3)
KAPPA/LAMBDA FLC RATIO: 1.9 — AB (ref 0.26–1.65)

## 2017-02-15 LAB — BETA 2 MICROGLOBULIN, SERUM: BETA 2: 9.6 mg/L — AB (ref 0.6–2.4)

## 2017-02-19 DIAGNOSIS — N183 Chronic kidney disease, stage 3 (moderate): Secondary | ICD-10-CM | POA: Diagnosis not present

## 2017-02-19 LAB — IMMUNOFIXATION ELECTROPHORESIS
IGA/IMMUNOGLOBULIN A, SERUM: 407 mg/dL — AB (ref 87–352)
IGM (IMMUNOGLOBIN M), SRM: 106 mg/dL (ref 26–217)
IgG, Qn, Serum: 1176 mg/dL (ref 700–1600)
TOTAL PROTEIN: 6.3 g/dL (ref 6.0–8.5)

## 2017-02-21 ENCOUNTER — Ambulatory Visit: Payer: Medicare HMO | Admitting: Internal Medicine

## 2017-02-21 ENCOUNTER — Encounter: Payer: Self-pay | Admitting: Internal Medicine

## 2017-02-21 ENCOUNTER — Telehealth: Payer: Self-pay | Admitting: Internal Medicine

## 2017-02-21 VITALS — BP 155/77 | HR 96 | Temp 98.1°F | Resp 18 | Ht 69.0 in | Wt 290.9 lb

## 2017-02-21 DIAGNOSIS — D890 Polyclonal hypergammaglobulinemia: Secondary | ICD-10-CM

## 2017-02-21 DIAGNOSIS — D472 Monoclonal gammopathy: Secondary | ICD-10-CM | POA: Diagnosis not present

## 2017-02-21 NOTE — Telephone Encounter (Signed)
Gave avs and calendar for June 2019  °

## 2017-02-21 NOTE — Progress Notes (Signed)
[place] Telephone:(336) [phone]   Fax:(336) [phone]  OFFICE PROGRESS NOTE  Wenda Low, MD [address] [place] [address]  DIAGNOSIS: Monoclonal gammopathy of undetermined significance  PRIOR THERAPY: None  CURRENT THERAPY: Observation.  INTERVAL HISTORY: Kristen English 61 y.o. female returns to the clinic today for follow-up visit.  The patient has no complaints today.  She enjoyed her Thanksgiving holiday in [place] with her daughter.  She denied having any fatigue or weakness.  She denied having any chest pain, shortness of breath, cough or hemoptysis.  She denied having any fever or chills.  She has no nausea, vomiting, diarrhea or constipation.  The patient had repeat myeloma panel performed recently and she is here for evaluation and discussion of her lab results.  MEDICAL HISTORY: Past Medical History:  Diagnosis Date  . Arthritis   . Chronic kidney disease    protein in urine  . Diabetes mellitus without complication ([name])   . Gout   . Hypertension     ALLERGIES:  is allergic to morphine and related.  MEDICATIONS:  Current Outpatient Medications  Medication Sig Dispense Refill  . amLODipine (NORVASC) 10 MG tablet Take 10 mg by mouth at bedtime.     Marland Kitchen aspirin 81 MG tablet Take 81 mg by mouth at bedtime.     Marland Kitchen atorvastatin (LIPITOR) 40 MG tablet     . BOOSTRIX 5-2.5-18.5 injection     . carvedilol (COREG) 25 MG tablet Take 25 mg by mouth 2 (two) times daily with a meal.     . cholecalciferol (VITAMIN D) 1000 UNITS tablet Take 2,000 Units by mouth every morning.     . colchicine 0.6 MG tablet Take 0.6 mg by mouth every 12 (twelve) hours as needed (gout.).     Marland Kitchen ferrous sulfate (FERROUSUL) 325 (65 FE) MG tablet Take 325 mg by mouth.    . ferrous sulfate 325 (65 FE) MG tablet Take 325 mg by mouth daily with breakfast.    . furosemide (LASIX) 40 MG tablet Take 40 mg by mouth 2 (two) times daily as needed  for fluid.    Marland Kitchen glipiZIDE (GLUCOTROL) 5 MG tablet Take 5 mg by mouth 3 (three) times daily with meals.    . hydrOXYzine (ATARAX/VISTARIL) 25 MG tablet Take 25 mg by mouth at bedtime as needed for itching.     . Liraglutide (VICTOZA) 18 MG/3ML SOPN Inject 1.8 mg into the skin daily at 12 noon.     . lovastatin (MEVACOR) 10 MG tablet Take 10 mg by mouth at bedtime.     Marland Kitchen NOVOFINE 32G X 6 MM MISC      No current facility-administered medications for this visit.     SURGICAL HISTORY:  Past Surgical History:  Procedure Laterality Date  . COLONOSCOPY WITH PROPOFOL N/A 05/11/2014   Procedure: COLONOSCOPY WITH PROPOFOL;  Surgeon: Garlan Fair, MD;  Location: [facility];  Service: Endoscopy;  Laterality: N/A;  . FOOT SURGERY Left 2007  . JOINT REPLACEMENT  2009   bilateral knees  . TOTAL KNEE ARTHROPLASTY     bilateral    REVIEW OF SYSTEMS:  A comprehensive review of systems was negative.   PHYSICAL EXAMINATION: General appearance: alert, cooperative and no distress Head: Normocephalic, without obvious abnormality, atraumatic Neck: no adenopathy, no JVD, supple, symmetrical, trachea midline and thyroid not enlarged, symmetric, no tenderness/mass/nodules Lymph nodes: Cervical, supraclavicular, and axillary nodes normal. Resp: clear to  auscultation bilaterally Back: symmetric, no curvature. ROM normal. No CVA tenderness. Cardio: regular rate and rhythm, S1, S2 normal, no murmur, click, rub or gallop GI: soft, non-tender; bowel sounds normal; no masses,  no organomegaly Extremities: extremities normal, atraumatic, no cyanosis or edema  ECOG PERFORMANCE STATUS: 1 - Symptomatic but completely ambulatory  Blood pressure (!) 155/77, pulse 96, temperature 98.1 F (36.7 C), temperature source Oral, resp. rate 18, height 5\' 9"  (1.753 m), weight 290 lb 14.4 oz (132 kg), SpO2 99 %.   LABORATORY DATA: Lab Results  Component Value Date   WBC 4.6 02/14/2017   HGB 9.9 (L) 02/14/2017   HCT  31.7 (L) 02/14/2017   MCV 86.3 02/14/2017   PLT 195 02/14/2017      Chemistry      Component Value Date/Time   NA 142 02/14/2017 1100   K 4.4 02/14/2017 1100   CL 102 02/25/2008 0548   CO2 24 02/14/2017 1100   BUN 43.2 (H) 02/14/2017 1100   CREATININE 4.2 (HH) 02/14/2017 1100      Component Value Date/Time   CALCIUM 9.9 02/14/2017 1100   ALKPHOS 108 02/14/2017 1100   AST 21 02/14/2017 1100   ALT 15 02/14/2017 1100   BILITOT 0.33 02/14/2017 1100     RADIOGRAPHIC STUDIES: No results found. 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 She had myeloma panel today that showed no further increase in her protein or the serum free kappa light chain. I discussed the lab results with the patient and recommended for her to continue on observation. I will see her back for follow-up visit in 6 months for evaluation with repeat myeloma panel. She was advised to call immediately if she has any concerning symptoms in the interval. The patient voices understanding of current disease status and treatment options and is in agreement with the current care plan. All questions were answered. The patient knows to call the clinic with any problems, questions or concerns. We can certainly see the patient much sooner if necessary. I spent 10 minutes counseling the patient face to face. The total time spent in the appointment was 15 minutes. Disclaimer: This note was dictated with voice recognition software. Similar sounding words can inadvertently be transcribed and may not be corrected upon review.

## 2017-03-09 DIAGNOSIS — E119 Type 2 diabetes mellitus without complications: Secondary | ICD-10-CM | POA: Diagnosis not present

## 2017-03-09 DIAGNOSIS — E785 Hyperlipidemia, unspecified: Secondary | ICD-10-CM | POA: Diagnosis not present

## 2017-03-09 DIAGNOSIS — I129 Hypertensive chronic kidney disease with stage 1 through stage 4 chronic kidney disease, or unspecified chronic kidney disease: Secondary | ICD-10-CM | POA: Diagnosis not present

## 2017-03-09 DIAGNOSIS — M109 Gout, unspecified: Secondary | ICD-10-CM | POA: Diagnosis not present

## 2017-03-09 DIAGNOSIS — Z6841 Body Mass Index (BMI) 40.0 and over, adult: Secondary | ICD-10-CM | POA: Diagnosis not present

## 2017-03-09 DIAGNOSIS — N183 Chronic kidney disease, stage 3 (moderate): Secondary | ICD-10-CM | POA: Diagnosis not present

## 2017-03-09 DIAGNOSIS — Z23 Encounter for immunization: Secondary | ICD-10-CM | POA: Diagnosis not present

## 2017-03-11 DIAGNOSIS — H52223 Regular astigmatism, bilateral: Secondary | ICD-10-CM | POA: Diagnosis not present

## 2017-03-12 DIAGNOSIS — H524 Presbyopia: Secondary | ICD-10-CM | POA: Diagnosis not present

## 2017-03-12 DIAGNOSIS — H52209 Unspecified astigmatism, unspecified eye: Secondary | ICD-10-CM | POA: Diagnosis not present

## 2017-03-12 DIAGNOSIS — H5203 Hypermetropia, bilateral: Secondary | ICD-10-CM | POA: Diagnosis not present

## 2017-03-21 DIAGNOSIS — N183 Chronic kidney disease, stage 3 (moderate): Secondary | ICD-10-CM | POA: Diagnosis not present

## 2017-04-17 ENCOUNTER — Other Ambulatory Visit (HOSPITAL_COMMUNITY)
Admission: RE | Admit: 2017-04-17 | Discharge: 2017-04-17 | Disposition: A | Discharge status: Home / Self Care | Payer: Medicare HMO | Source: Ambulatory Visit | Attending: Obstetrics and Gynecology | Admitting: Obstetrics and Gynecology

## 2017-04-17 ENCOUNTER — Other Ambulatory Visit: Payer: Self-pay | Admitting: Obstetrics and Gynecology

## 2017-04-17 DIAGNOSIS — Z124 Encounter for screening for malignant neoplasm of cervix: Secondary | ICD-10-CM | POA: Insufficient documentation

## 2017-04-17 DIAGNOSIS — Z01419 Encounter for gynecological examination (general) (routine) without abnormal findings: Secondary | ICD-10-CM | POA: Diagnosis not present

## 2017-04-18 LAB — CYTOLOGY - PAP
Diagnosis: NEGATIVE
HPV (WINDOPATH): NOT DETECTED

## 2017-04-23 DIAGNOSIS — D472 Monoclonal gammopathy: Secondary | ICD-10-CM | POA: Diagnosis not present

## 2017-04-23 DIAGNOSIS — E782 Mixed hyperlipidemia: Secondary | ICD-10-CM | POA: Diagnosis not present

## 2017-04-23 DIAGNOSIS — N183 Chronic kidney disease, stage 3 (moderate): Secondary | ICD-10-CM | POA: Diagnosis not present

## 2017-04-23 DIAGNOSIS — N184 Chronic kidney disease, stage 4 (severe): Secondary | ICD-10-CM | POA: Diagnosis not present

## 2017-04-23 DIAGNOSIS — I10 Essential (primary) hypertension: Secondary | ICD-10-CM | POA: Diagnosis not present

## 2017-04-23 DIAGNOSIS — E1122 Type 2 diabetes mellitus with diabetic chronic kidney disease: Secondary | ICD-10-CM | POA: Diagnosis not present

## 2017-04-23 DIAGNOSIS — Z1389 Encounter for screening for other disorder: Secondary | ICD-10-CM | POA: Diagnosis not present

## 2017-04-23 DIAGNOSIS — M109 Gout, unspecified: Secondary | ICD-10-CM | POA: Diagnosis not present

## 2017-05-16 DIAGNOSIS — Z1231 Encounter for screening mammogram for malignant neoplasm of breast: Secondary | ICD-10-CM | POA: Diagnosis not present

## 2017-05-21 DIAGNOSIS — N183 Chronic kidney disease, stage 3 (moderate): Secondary | ICD-10-CM | POA: Diagnosis not present

## 2017-05-24 DIAGNOSIS — I10 Essential (primary) hypertension: Secondary | ICD-10-CM | POA: Diagnosis not present

## 2017-05-24 DIAGNOSIS — Z794 Long term (current) use of insulin: Secondary | ICD-10-CM | POA: Diagnosis not present

## 2017-05-24 DIAGNOSIS — E1122 Type 2 diabetes mellitus with diabetic chronic kidney disease: Secondary | ICD-10-CM | POA: Diagnosis not present

## 2017-05-24 DIAGNOSIS — N184 Chronic kidney disease, stage 4 (severe): Secondary | ICD-10-CM | POA: Diagnosis not present

## 2017-05-24 DIAGNOSIS — D649 Anemia, unspecified: Secondary | ICD-10-CM | POA: Diagnosis not present

## 2017-05-24 DIAGNOSIS — E782 Mixed hyperlipidemia: Secondary | ICD-10-CM | POA: Diagnosis not present

## 2017-06-19 DIAGNOSIS — N183 Chronic kidney disease, stage 3 (moderate): Secondary | ICD-10-CM | POA: Diagnosis not present

## 2017-07-16 DIAGNOSIS — E1122 Type 2 diabetes mellitus with diabetic chronic kidney disease: Secondary | ICD-10-CM | POA: Diagnosis not present

## 2017-07-16 DIAGNOSIS — Z6841 Body Mass Index (BMI) 40.0 and over, adult: Secondary | ICD-10-CM | POA: Diagnosis not present

## 2017-07-16 DIAGNOSIS — M109 Gout, unspecified: Secondary | ICD-10-CM | POA: Diagnosis not present

## 2017-07-16 DIAGNOSIS — I129 Hypertensive chronic kidney disease with stage 1 through stage 4 chronic kidney disease, or unspecified chronic kidney disease: Secondary | ICD-10-CM | POA: Diagnosis not present

## 2017-07-16 DIAGNOSIS — N183 Chronic kidney disease, stage 3 (moderate): Secondary | ICD-10-CM | POA: Diagnosis not present

## 2017-07-16 DIAGNOSIS — E785 Hyperlipidemia, unspecified: Secondary | ICD-10-CM | POA: Diagnosis not present

## 2017-08-20 DIAGNOSIS — N183 Chronic kidney disease, stage 3 (moderate): Secondary | ICD-10-CM | POA: Diagnosis not present

## 2017-08-22 ENCOUNTER — Inpatient Hospital Stay: Payer: Medicare HMO | Attending: Internal Medicine

## 2017-08-22 DIAGNOSIS — M109 Gout, unspecified: Secondary | ICD-10-CM | POA: Insufficient documentation

## 2017-08-22 DIAGNOSIS — I129 Hypertensive chronic kidney disease with stage 1 through stage 4 chronic kidney disease, or unspecified chronic kidney disease: Secondary | ICD-10-CM | POA: Insufficient documentation

## 2017-08-22 DIAGNOSIS — N189 Chronic kidney disease, unspecified: Secondary | ICD-10-CM | POA: Diagnosis not present

## 2017-08-22 DIAGNOSIS — D7589 Other specified diseases of blood and blood-forming organs: Secondary | ICD-10-CM | POA: Diagnosis not present

## 2017-08-22 DIAGNOSIS — Z79899 Other long term (current) drug therapy: Secondary | ICD-10-CM | POA: Diagnosis not present

## 2017-08-22 DIAGNOSIS — Z7982 Long term (current) use of aspirin: Secondary | ICD-10-CM | POA: Insufficient documentation

## 2017-08-22 DIAGNOSIS — E119 Type 2 diabetes mellitus without complications: Secondary | ICD-10-CM | POA: Insufficient documentation

## 2017-08-22 DIAGNOSIS — D890 Polyclonal hypergammaglobulinemia: Secondary | ICD-10-CM

## 2017-08-22 DIAGNOSIS — D472 Monoclonal gammopathy: Secondary | ICD-10-CM | POA: Diagnosis not present

## 2017-08-22 LAB — CBC WITH DIFFERENTIAL/PLATELET
Basophils Absolute: 0 10*3/uL (ref 0.0–0.1)
Basophils Relative: 1 %
EOS PCT: 3 %
Eosinophils Absolute: 0.1 10*3/uL (ref 0.0–0.5)
HCT: 30.2 % — ABNORMAL LOW (ref 34.8–46.6)
HEMOGLOBIN: 9.6 g/dL — AB (ref 11.6–15.9)
LYMPHS ABS: 0.9 10*3/uL (ref 0.9–3.3)
LYMPHS PCT: 26 %
MCH: 27.4 pg (ref 25.1–34.0)
MCHC: 31.7 g/dL (ref 31.5–36.0)
MCV: 86.3 fL (ref 79.5–101.0)
Monocytes Absolute: 0.3 10*3/uL (ref 0.1–0.9)
Monocytes Relative: 8 %
Neutro Abs: 2.3 10*3/uL (ref 1.5–6.5)
Neutrophils Relative %: 62 %
PLATELETS: 173 10*3/uL (ref 145–400)
RBC: 3.49 MIL/uL — AB (ref 3.70–5.45)
RDW: 15.1 % — ABNORMAL HIGH (ref 11.2–14.5)
WBC: 3.7 10*3/uL — ABNORMAL LOW (ref 3.9–10.3)

## 2017-08-22 LAB — COMPREHENSIVE METABOLIC PANEL
ALK PHOS: 126 U/L (ref 40–150)
ALT: 17 U/L (ref 0–55)
AST: 21 U/L (ref 5–34)
Albumin: 2.9 g/dL — ABNORMAL LOW (ref 3.5–5.0)
Anion gap: 7 (ref 3–11)
BILIRUBIN TOTAL: 0.3 mg/dL (ref 0.2–1.2)
BUN: 38 mg/dL — ABNORMAL HIGH (ref 7–26)
CALCIUM: 9.8 mg/dL (ref 8.4–10.4)
CO2: 25 mmol/L (ref 22–29)
Chloride: 110 mmol/L — ABNORMAL HIGH (ref 98–109)
Creatinine, Ser: 4.23 mg/dL (ref 0.60–1.10)
GFR, EST AFRICAN AMERICAN: 12 mL/min — AB (ref >60)
GFR, EST NON AFRICAN AMERICAN: 10 mL/min — AB (ref >60)
Glucose, Bld: 122 mg/dL (ref 70–140)
Potassium: 4.2 mmol/L (ref 3.5–5.1)
Sodium: 142 mmol/L (ref 136–145)
TOTAL PROTEIN: 6.7 g/dL (ref 6.4–8.3)

## 2017-08-22 LAB — LACTATE DEHYDROGENASE: LDH: 204 U/L (ref 125–245)

## 2017-08-23 LAB — KAPPA/LAMBDA LIGHT CHAINS
KAPPA FREE LGHT CHN: 183.8 mg/L — AB (ref 3.3–19.4)
Kappa, lambda light chain ratio: 2.53 — ABNORMAL HIGH (ref 0.26–1.65)
LAMDA FREE LIGHT CHAINS: 72.7 mg/L — AB (ref 5.7–26.3)

## 2017-08-23 LAB — IGG, IGA, IGM
IGA: 402 mg/dL — AB (ref 87–352)
IGM (IMMUNOGLOBULIN M), SRM: 89 mg/dL (ref 26–217)
IgG (Immunoglobin G), Serum: 1191 mg/dL (ref 700–1600)

## 2017-08-23 LAB — BETA 2 MICROGLOBULIN, SERUM: Beta-2 Microglobulin: 7.8 mg/L — ABNORMAL HIGH (ref 0.6–2.4)

## 2017-08-29 ENCOUNTER — Telehealth: Payer: Self-pay | Admitting: Internal Medicine

## 2017-08-29 ENCOUNTER — Encounter: Payer: Self-pay | Admitting: Internal Medicine

## 2017-08-29 ENCOUNTER — Inpatient Hospital Stay: Payer: Medicare HMO | Admitting: Internal Medicine

## 2017-08-29 VITALS — BP 149/97 | HR 85 | Temp 98.4°F | Resp 18 | Ht 69.0 in | Wt 297.9 lb

## 2017-08-29 DIAGNOSIS — E119 Type 2 diabetes mellitus without complications: Secondary | ICD-10-CM | POA: Diagnosis not present

## 2017-08-29 DIAGNOSIS — D472 Monoclonal gammopathy: Secondary | ICD-10-CM | POA: Diagnosis not present

## 2017-08-29 DIAGNOSIS — I129 Hypertensive chronic kidney disease with stage 1 through stage 4 chronic kidney disease, or unspecified chronic kidney disease: Secondary | ICD-10-CM

## 2017-08-29 DIAGNOSIS — M109 Gout, unspecified: Secondary | ICD-10-CM | POA: Diagnosis not present

## 2017-08-29 DIAGNOSIS — Z7982 Long term (current) use of aspirin: Secondary | ICD-10-CM

## 2017-08-29 DIAGNOSIS — Z79899 Other long term (current) drug therapy: Secondary | ICD-10-CM | POA: Diagnosis not present

## 2017-08-29 DIAGNOSIS — D890 Polyclonal hypergammaglobulinemia: Principal | ICD-10-CM

## 2017-08-29 DIAGNOSIS — D7589 Other specified diseases of blood and blood-forming organs: Secondary | ICD-10-CM | POA: Diagnosis not present

## 2017-08-29 DIAGNOSIS — N189 Chronic kidney disease, unspecified: Secondary | ICD-10-CM

## 2017-08-29 NOTE — Telephone Encounter (Signed)
Appointments scheduled AVS/Calendar printed per 6/12 los °

## 2017-08-29 NOTE — Progress Notes (Signed)
[place] Telephone:(336) [phone]   Fax:(336) [phone]  OFFICE PROGRESS NOTE  Wenda Low, MD [address] [place]  [address]  DIAGNOSIS: Monoclonal gammopathy of undetermined significance  PRIOR THERAPY: None  CURRENT THERAPY: Observation.  INTERVAL HISTORY: Kristen English 62 y.o. female returns to the clinic today for six-month follow-up visit.  The patient is feeling fine today with no specific complaints.  She denied having any recent chest pain, shortness of breath, cough or hemoptysis.  She denied having any weight loss or night sweats.  She has no nausea, vomiting, diarrhea or constipation.  She has been in observation and doing well.  There is no change in her situation with the renal transplant.  She had repeat myeloma panel performed recently and she is here for evaluation and discussion of her lab results.   MEDICAL HISTORY: Past Medical History:  Diagnosis Date  . Arthritis   . Chronic kidney disease    protein in urine  . Diabetes mellitus without complication ([place])   . Gout   . Hypertension     ALLERGIES:  is allergic to morphine and related.  MEDICATIONS:  Current Outpatient Medications  Medication Sig Dispense Refill  . amLODipine (NORVASC) 10 MG tablet Take 10 mg by mouth at bedtime.     Marland Kitchen aspirin 81 MG tablet Take 81 mg by mouth at bedtime.     Marland Kitchen atorvastatin (LIPITOR) 40 MG tablet     . BOOSTRIX 5-2.5-18.5 injection     . carvedilol (COREG) 25 MG tablet Take 25 mg by mouth 2 (two) times daily with a meal.     . cholecalciferol (VITAMIN D) 1000 UNITS tablet Take 2,000 Units by mouth every morning.     . colchicine 0.6 MG tablet Take 0.6 mg by mouth every 12 (twelve) hours as needed (gout.).     Marland Kitchen ferrous sulfate (FERROUSUL) 325 (65 FE) MG tablet Take 325 mg by mouth.    . ferrous sulfate 325 (65 FE) MG tablet Take 325 mg by mouth daily with breakfast.    . furosemide (LASIX) 40 MG tablet Take 40 mg by mouth 2  (two) times daily as needed for fluid.    Marland Kitchen glipiZIDE (GLUCOTROL) 5 MG tablet Take 5 mg by mouth 3 (three) times daily with meals.    . hydrOXYzine (ATARAX/VISTARIL) 25 MG tablet Take 25 mg by mouth at bedtime as needed for itching.     . Liraglutide (VICTOZA) 18 MG/3ML SOPN Inject 1.8 mg into the skin daily at 12 noon.     . lovastatin (MEVACOR) 10 MG tablet Take 10 mg by mouth at bedtime.     Marland Kitchen NOVOFINE 32G X 6 MM MISC      No current facility-administered medications for this visit.     SURGICAL HISTORY:  Past Surgical History:  Procedure Laterality Date  . COLONOSCOPY WITH PROPOFOL N/A 05/11/2014   Procedure: COLONOSCOPY WITH PROPOFOL;  Surgeon: Garlan Fair, MD;  Location: [facility];  Service: Endoscopy;  Laterality: N/A;  . FOOT SURGERY Left 2007  . JOINT REPLACEMENT  2009   bilateral knees  . TOTAL KNEE ARTHROPLASTY     bilateral    REVIEW OF SYSTEMS:  A comprehensive review of systems was negative.   PHYSICAL EXAMINATION: General appearance: alert, cooperative and no distress Head: Normocephalic, without obvious abnormality, atraumatic Neck: no adenopathy, no JVD, supple, symmetrical, trachea midline and thyroid not enlarged, symmetric, no tenderness/mass/nodules Lymph nodes: Cervical,  supraclavicular, and axillary nodes normal. Resp: clear to auscultation bilaterally Back: symmetric, no curvature. ROM normal. No CVA tenderness. Cardio: regular rate and rhythm, S1, S2 normal, no murmur, click, rub or gallop GI: soft, non-tender; bowel sounds normal; no masses,  no organomegaly Extremities: extremities normal, atraumatic, no cyanosis or edema  ECOG PERFORMANCE STATUS: 1 - Symptomatic but completely ambulatory  There were no vitals taken for this visit.   LABORATORY DATA: Lab Results  Component Value Date   WBC 3.7 (L) 08/22/2017   HGB 9.6 (L) 08/22/2017   HCT 30.2 (L) 08/22/2017   MCV 86.3 08/22/2017   PLT 173 08/22/2017      Chemistry      Component  Value Date/Time   NA 142 08/22/2017 1120   NA 142 02/14/2017 1100   K 4.2 08/22/2017 1120   K 4.4 02/14/2017 1100   CL 110 (H) 08/22/2017 1120   CO2 25 08/22/2017 1120   CO2 24 02/14/2017 1100   BUN 38 (H) 08/22/2017 1120   BUN 43.2 (H) 02/14/2017 1100   CREATININE 4.23 (HH) 08/22/2017 1120   CREATININE 4.2 (HH) 02/14/2017 1100      Component Value Date/Time   CALCIUM 9.8 08/22/2017 1120   CALCIUM 9.9 02/14/2017 1100   ALKPHOS 126 08/22/2017 1120   ALKPHOS 108 02/14/2017 1100   AST 21 08/22/2017 1120   AST 21 02/14/2017 1100   ALT 17 08/22/2017 1120   ALT 15 02/14/2017 1100   BILITOT 0.3 08/22/2017 1120   BILITOT 0.33 02/14/2017 1100     RADIOGRAPHIC STUDIES: No results found. 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 The patient has no complaints today. Her recent myeloma panel is stable compared to previous lab work. I discussed the lab results with the patient and recommended for her to continue on observation with repeat myeloma panel in 6 months. For the renal insufficiency, she is followed by Dr. Moshe Cipro. She was advised to call immediately if she has any concerning symptoms in the interval. The patient voices understanding of current disease status and treatment options and is in agreement with the current care plan. All questions were answered. The patient knows to call the clinic with any problems, questions or concerns. We can certainly see the patient much sooner if necessary. I spent 10 minutes counseling the patient face to face. The total time spent in the appointment was 15 minutes. Disclaimer: This note was dictated with voice recognition software. Similar sounding words can inadvertently be transcribed and may not be corrected upon review.

## 2017-09-19 DIAGNOSIS — N183 Chronic kidney disease, stage 3 (moderate): Secondary | ICD-10-CM | POA: Diagnosis not present

## 2017-10-23 DIAGNOSIS — Z1389 Encounter for screening for other disorder: Secondary | ICD-10-CM | POA: Diagnosis not present

## 2017-10-23 DIAGNOSIS — I10 Essential (primary) hypertension: Secondary | ICD-10-CM | POA: Diagnosis not present

## 2017-10-23 DIAGNOSIS — D649 Anemia, unspecified: Secondary | ICD-10-CM | POA: Diagnosis not present

## 2017-10-23 DIAGNOSIS — E782 Mixed hyperlipidemia: Secondary | ICD-10-CM | POA: Diagnosis not present

## 2017-10-23 DIAGNOSIS — M109 Gout, unspecified: Secondary | ICD-10-CM | POA: Diagnosis not present

## 2017-10-23 DIAGNOSIS — N183 Chronic kidney disease, stage 3 (moderate): Secondary | ICD-10-CM | POA: Diagnosis not present

## 2017-10-23 DIAGNOSIS — Z0000 Encounter for general adult medical examination without abnormal findings: Secondary | ICD-10-CM | POA: Diagnosis not present

## 2017-10-23 DIAGNOSIS — E1122 Type 2 diabetes mellitus with diabetic chronic kidney disease: Secondary | ICD-10-CM | POA: Diagnosis not present

## 2017-10-23 DIAGNOSIS — Z1159 Encounter for screening for other viral diseases: Secondary | ICD-10-CM | POA: Diagnosis not present

## 2017-10-23 DIAGNOSIS — R21 Rash and other nonspecific skin eruption: Secondary | ICD-10-CM | POA: Diagnosis not present

## 2017-10-23 DIAGNOSIS — D472 Monoclonal gammopathy: Secondary | ICD-10-CM | POA: Diagnosis not present

## 2017-11-05 DIAGNOSIS — T80319A ABO incompatibility with hemolytic transfusion reaction, unspecified, initial encounter: Secondary | ICD-10-CM | POA: Diagnosis not present

## 2017-11-05 DIAGNOSIS — R9431 Abnormal electrocardiogram [ECG] [EKG]: Secondary | ICD-10-CM | POA: Diagnosis not present

## 2017-11-05 DIAGNOSIS — Z79899 Other long term (current) drug therapy: Secondary | ICD-10-CM | POA: Diagnosis not present

## 2017-11-05 DIAGNOSIS — Z01812 Encounter for preprocedural laboratory examination: Secondary | ICD-10-CM | POA: Diagnosis not present

## 2017-11-05 DIAGNOSIS — I120 Hypertensive chronic kidney disease with stage 5 chronic kidney disease or end stage renal disease: Secondary | ICD-10-CM | POA: Diagnosis not present

## 2017-11-05 DIAGNOSIS — N186 End stage renal disease: Secondary | ICD-10-CM | POA: Diagnosis not present

## 2017-11-05 DIAGNOSIS — Z6841 Body Mass Index (BMI) 40.0 and over, adult: Secondary | ICD-10-CM | POA: Diagnosis not present

## 2017-11-05 DIAGNOSIS — N185 Chronic kidney disease, stage 5: Secondary | ICD-10-CM | POA: Diagnosis not present

## 2017-11-05 DIAGNOSIS — E1122 Type 2 diabetes mellitus with diabetic chronic kidney disease: Secondary | ICD-10-CM | POA: Diagnosis not present

## 2017-11-05 DIAGNOSIS — Z5181 Encounter for therapeutic drug level monitoring: Secondary | ICD-10-CM | POA: Diagnosis not present

## 2017-11-05 DIAGNOSIS — E039 Hypothyroidism, unspecified: Secondary | ICD-10-CM | POA: Diagnosis not present

## 2017-11-05 DIAGNOSIS — Z01810 Encounter for preprocedural cardiovascular examination: Secondary | ICD-10-CM | POA: Diagnosis not present

## 2017-11-05 DIAGNOSIS — Z01818 Encounter for other preprocedural examination: Secondary | ICD-10-CM | POA: Diagnosis not present

## 2017-11-05 DIAGNOSIS — D472 Monoclonal gammopathy: Secondary | ICD-10-CM | POA: Diagnosis not present

## 2017-11-15 DIAGNOSIS — E119 Type 2 diabetes mellitus without complications: Secondary | ICD-10-CM | POA: Diagnosis not present

## 2017-11-21 DIAGNOSIS — D631 Anemia in chronic kidney disease: Secondary | ICD-10-CM | POA: Diagnosis not present

## 2017-11-21 DIAGNOSIS — N183 Chronic kidney disease, stage 3 (moderate): Secondary | ICD-10-CM | POA: Diagnosis not present

## 2017-11-22 DIAGNOSIS — E039 Hypothyroidism, unspecified: Secondary | ICD-10-CM | POA: Diagnosis not present

## 2017-11-22 DIAGNOSIS — E119 Type 2 diabetes mellitus without complications: Secondary | ICD-10-CM | POA: Diagnosis not present

## 2017-11-30 DIAGNOSIS — E119 Type 2 diabetes mellitus without complications: Secondary | ICD-10-CM | POA: Diagnosis not present

## 2017-11-30 DIAGNOSIS — Z6841 Body Mass Index (BMI) 40.0 and over, adult: Secondary | ICD-10-CM | POA: Diagnosis not present

## 2017-11-30 DIAGNOSIS — M109 Gout, unspecified: Secondary | ICD-10-CM | POA: Diagnosis not present

## 2017-11-30 DIAGNOSIS — E785 Hyperlipidemia, unspecified: Secondary | ICD-10-CM | POA: Diagnosis not present

## 2017-11-30 DIAGNOSIS — N183 Chronic kidney disease, stage 3 (moderate): Secondary | ICD-10-CM | POA: Diagnosis not present

## 2017-11-30 DIAGNOSIS — E1122 Type 2 diabetes mellitus with diabetic chronic kidney disease: Secondary | ICD-10-CM | POA: Diagnosis not present

## 2017-11-30 DIAGNOSIS — I129 Hypertensive chronic kidney disease with stage 1 through stage 4 chronic kidney disease, or unspecified chronic kidney disease: Secondary | ICD-10-CM | POA: Diagnosis not present

## 2017-12-20 DIAGNOSIS — N183 Chronic kidney disease, stage 3 (moderate): Secondary | ICD-10-CM | POA: Diagnosis not present

## 2017-12-20 DIAGNOSIS — N189 Chronic kidney disease, unspecified: Secondary | ICD-10-CM | POA: Diagnosis not present

## 2017-12-28 DIAGNOSIS — E119 Type 2 diabetes mellitus without complications: Secondary | ICD-10-CM | POA: Diagnosis not present

## 2018-01-21 DIAGNOSIS — N183 Chronic kidney disease, stage 3 (moderate): Secondary | ICD-10-CM | POA: Diagnosis not present

## 2018-01-24 DIAGNOSIS — E119 Type 2 diabetes mellitus without complications: Secondary | ICD-10-CM | POA: Diagnosis not present

## 2018-02-19 DIAGNOSIS — N183 Chronic kidney disease, stage 3 (moderate): Secondary | ICD-10-CM | POA: Diagnosis not present

## 2018-02-21 ENCOUNTER — Inpatient Hospital Stay: Payer: Medicare HMO | Attending: Internal Medicine

## 2018-02-21 DIAGNOSIS — Z79899 Other long term (current) drug therapy: Secondary | ICD-10-CM | POA: Diagnosis not present

## 2018-02-21 DIAGNOSIS — Z7682 Awaiting organ transplant status: Secondary | ICD-10-CM | POA: Insufficient documentation

## 2018-02-21 DIAGNOSIS — I10 Essential (primary) hypertension: Secondary | ICD-10-CM | POA: Diagnosis not present

## 2018-02-21 DIAGNOSIS — M109 Gout, unspecified: Secondary | ICD-10-CM | POA: Insufficient documentation

## 2018-02-21 DIAGNOSIS — Z7982 Long term (current) use of aspirin: Secondary | ICD-10-CM | POA: Diagnosis not present

## 2018-02-21 DIAGNOSIS — M129 Arthropathy, unspecified: Secondary | ICD-10-CM | POA: Insufficient documentation

## 2018-02-21 DIAGNOSIS — D890 Polyclonal hypergammaglobulinemia: Secondary | ICD-10-CM | POA: Diagnosis not present

## 2018-02-21 DIAGNOSIS — E119 Type 2 diabetes mellitus without complications: Secondary | ICD-10-CM | POA: Insufficient documentation

## 2018-02-21 DIAGNOSIS — D472 Monoclonal gammopathy: Secondary | ICD-10-CM

## 2018-02-21 LAB — CMP (CANCER CENTER ONLY)
ALT: 15 U/L (ref 0–44)
AST: 22 U/L (ref 15–41)
Albumin: 3.1 g/dL — ABNORMAL LOW (ref 3.5–5.0)
Alkaline Phosphatase: 107 U/L (ref 38–126)
Anion gap: 10 (ref 5–15)
BUN: 43 mg/dL — ABNORMAL HIGH (ref 8–23)
CALCIUM: 10.3 mg/dL (ref 8.9–10.3)
CO2: 23 mmol/L (ref 22–32)
CREATININE: 4.41 mg/dL — AB (ref 0.44–1.00)
Chloride: 111 mmol/L (ref 98–111)
GFR, Est AFR Am: 11 mL/min — ABNORMAL LOW (ref >60)
GFR, Estimated: 10 mL/min — ABNORMAL LOW (ref >60)
Glucose, Bld: 61 mg/dL — ABNORMAL LOW (ref 70–99)
Potassium: 4.3 mmol/L (ref 3.5–5.1)
Sodium: 144 mmol/L (ref 135–145)
TOTAL PROTEIN: 6.9 g/dL (ref 6.5–8.1)
Total Bilirubin: 0.3 mg/dL (ref 0.3–1.2)

## 2018-02-21 LAB — CBC WITH DIFFERENTIAL (CANCER CENTER ONLY)
Abs Immature Granulocytes: 0.01 10*3/uL (ref 0.00–0.07)
BASOS PCT: 0 %
Basophils Absolute: 0 10*3/uL (ref 0.0–0.1)
EOS PCT: 3 %
Eosinophils Absolute: 0.2 10*3/uL (ref 0.0–0.5)
HCT: 31.5 % — ABNORMAL LOW (ref 36.0–46.0)
Hemoglobin: 9.4 g/dL — ABNORMAL LOW (ref 12.0–15.0)
Immature Granulocytes: 0 %
Lymphocytes Relative: 22 %
Lymphs Abs: 1 10*3/uL (ref 0.7–4.0)
MCH: 27.2 pg (ref 26.0–34.0)
MCHC: 29.8 g/dL — AB (ref 30.0–36.0)
MCV: 91 fL (ref 80.0–100.0)
Monocytes Absolute: 0.3 10*3/uL (ref 0.1–1.0)
Monocytes Relative: 6 %
Neutro Abs: 3 10*3/uL (ref 1.7–7.7)
Neutrophils Relative %: 69 %
Platelet Count: 172 10*3/uL (ref 150–400)
RBC: 3.46 MIL/uL — ABNORMAL LOW (ref 3.87–5.11)
RDW: 14.9 % (ref 11.5–15.5)
WBC Count: 4.4 10*3/uL (ref 4.0–10.5)
nRBC: 0 % (ref 0.0–0.2)

## 2018-02-21 LAB — LACTATE DEHYDROGENASE: LDH: 191 U/L (ref 98–192)

## 2018-02-22 LAB — IGG, IGA, IGM
IGA: 410 mg/dL — AB (ref 87–352)
IgG (Immunoglobin G), Serum: 1327 mg/dL (ref 700–1600)
IgM (Immunoglobulin M), Srm: 93 mg/dL (ref 26–217)

## 2018-02-22 LAB — KAPPA/LAMBDA LIGHT CHAINS
KAPPA, LAMDA LIGHT CHAIN RATIO: 2.9 — AB (ref 0.26–1.65)
Kappa free light chain: 252 mg/L — ABNORMAL HIGH (ref 3.3–19.4)
Lambda free light chains: 86.8 mg/L — ABNORMAL HIGH (ref 5.7–26.3)

## 2018-02-22 LAB — BETA 2 MICROGLOBULIN, SERUM: BETA 2 MICROGLOBULIN: 9.7 mg/L — AB (ref 0.6–2.4)

## 2018-02-28 ENCOUNTER — Telehealth: Payer: Self-pay

## 2018-02-28 ENCOUNTER — Encounter: Payer: Self-pay | Admitting: Internal Medicine

## 2018-02-28 ENCOUNTER — Inpatient Hospital Stay: Payer: Medicare HMO | Admitting: Internal Medicine

## 2018-02-28 VITALS — BP 160/80 | HR 83 | Temp 98.5°F | Resp 18 | Wt 275.6 lb

## 2018-02-28 DIAGNOSIS — D890 Polyclonal hypergammaglobulinemia: Secondary | ICD-10-CM

## 2018-02-28 DIAGNOSIS — Z7682 Awaiting organ transplant status: Secondary | ICD-10-CM | POA: Diagnosis not present

## 2018-02-28 DIAGNOSIS — Z79899 Other long term (current) drug therapy: Secondary | ICD-10-CM

## 2018-02-28 DIAGNOSIS — E119 Type 2 diabetes mellitus without complications: Secondary | ICD-10-CM

## 2018-02-28 DIAGNOSIS — M129 Arthropathy, unspecified: Secondary | ICD-10-CM

## 2018-02-28 DIAGNOSIS — I10 Essential (primary) hypertension: Secondary | ICD-10-CM | POA: Diagnosis not present

## 2018-02-28 DIAGNOSIS — M109 Gout, unspecified: Secondary | ICD-10-CM | POA: Diagnosis not present

## 2018-02-28 DIAGNOSIS — Z7982 Long term (current) use of aspirin: Secondary | ICD-10-CM | POA: Diagnosis not present

## 2018-02-28 NOTE — Progress Notes (Signed)
[place] Telephone:(336) [phone]   Fax:(336) [phone]  OFFICE PROGRESS NOTE  Wenda Low, MD [address] [place] [address]  DIAGNOSIS: Polyclonal gammopathy of undetermined significance  PRIOR THERAPY: None  CURRENT THERAPY: Observation.  INTERVAL HISTORY: Kristen English 62 y.o. female returns to the clinic today for follow-up visit.  The patient is feeling fine today with no concerning complaints.  She denied having any significant chest pain, shortness of breath, cough or hemoptysis.  She denied having any fever or chills.  She has no nausea, vomiting, diarrhea or constipation.  She intentionally lost around 15 pounds recently.  The patient had repeat myeloma panel performed recently and she is here for evaluation and discussion of her lab results.  MEDICAL HISTORY: Past Medical History:  Diagnosis Date  . Arthritis   . Chronic kidney disease    protein in urine  . Diabetes mellitus without complication ([place])   . Gout   . Hypertension     ALLERGIES:  is allergic to morphine and related.  MEDICATIONS:  Current Outpatient Medications  Medication Sig Dispense Refill  . amLODipine (NORVASC) 10 MG tablet Take 10 mg by mouth at bedtime.     Marland Kitchen aspirin 81 MG tablet Take 81 mg by mouth at bedtime.     Marland Kitchen atorvastatin (LIPITOR) 40 MG tablet     . BOOSTRIX 5-2.5-18.5 injection     . carvedilol (COREG) 25 MG tablet Take 25 mg by mouth 2 (two) times daily with a meal.     . cholecalciferol (VITAMIN D) 1000 UNITS tablet Take 2,000 Units by mouth every morning.     . colchicine 0.6 MG tablet Take 0.6 mg by mouth every 12 (twelve) hours as needed (gout.).     Marland Kitchen ferrous sulfate (FERROUSUL) 325 (65 FE) MG tablet Take 325 mg by mouth.    . ferrous sulfate 325 (65 FE) MG tablet Take 325 mg by mouth daily with breakfast.    . furosemide (LASIX) 40 MG tablet Take 40 mg by mouth 2 (two) times daily as needed for fluid.    Marland Kitchen glipiZIDE (GLUCOTROL)  5 MG tablet Take 5 mg by mouth 3 (three) times daily with meals.    . hydrOXYzine (ATARAX/VISTARIL) 25 MG tablet Take 25 mg by mouth at bedtime as needed for itching.     . Liraglutide (VICTOZA) 18 MG/3ML SOPN Inject 1.8 mg into the skin daily at 12 noon.     . lovastatin (MEVACOR) 10 MG tablet Take 10 mg by mouth at bedtime.     Marland Kitchen NOVOFINE 32G X 6 MM MISC      No current facility-administered medications for this visit.     SURGICAL HISTORY:  Past Surgical History:  Procedure Laterality Date  . COLONOSCOPY WITH PROPOFOL N/A 05/11/2014   Procedure: COLONOSCOPY WITH PROPOFOL;  Surgeon: Garlan Fair, MD;  Location: [facility];  Service: Endoscopy;  Laterality: N/A;  . FOOT SURGERY Left 2007  . JOINT REPLACEMENT  2009   bilateral knees  . TOTAL KNEE ARTHROPLASTY     bilateral    REVIEW OF SYSTEMS:  A comprehensive review of systems was negative.   PHYSICAL EXAMINATION: General appearance: alert, cooperative, fatigued and no distress Head: Normocephalic, without obvious abnormality, atraumatic Neck: no adenopathy, no JVD, supple, symmetrical, trachea midline and thyroid not enlarged, symmetric, no tenderness/mass/nodules Lymph nodes: Cervical, supraclavicular, and axillary nodes normal. Resp: clear to auscultation bilaterally Back: symmetric, no curvature. ROM  normal. No CVA tenderness. Cardio: regular rate and rhythm, S1, S2 normal, no murmur, click, rub or gallop GI: soft, non-tender; bowel sounds normal; no masses,  no organomegaly Extremities: extremities normal, atraumatic, no cyanosis or edema  ECOG PERFORMANCE STATUS: 1 - Symptomatic but completely ambulatory  Blood pressure (!) 160/80, pulse 83, temperature 98.5 F (36.9 C), temperature source Oral, resp. rate 18, weight 275 lb 9.6 oz (125 kg), SpO2 100 %.   LABORATORY DATA: Lab Results  Component Value Date   WBC 4.4 02/21/2018   HGB 9.4 (L) 02/21/2018   HCT 31.5 (L) 02/21/2018   MCV 91.0 02/21/2018   PLT 172  02/21/2018      Chemistry      Component Value Date/Time   NA 144 02/21/2018 1132   NA 142 02/14/2017 1100   K 4.3 02/21/2018 1132   K 4.4 02/14/2017 1100   CL 111 02/21/2018 1132   CO2 23 02/21/2018 1132   CO2 24 02/14/2017 1100   BUN 43 (H) 02/21/2018 1132   BUN 43.2 (H) 02/14/2017 1100   CREATININE 4.41 (HH) 02/21/2018 1132   CREATININE 4.2 (HH) 02/14/2017 1100      Component Value Date/Time   CALCIUM 10.3 02/21/2018 1132   CALCIUM 9.9 02/14/2017 1100   ALKPHOS 107 02/21/2018 1132   ALKPHOS 108 02/14/2017 1100   AST 22 02/21/2018 1132   AST 21 02/14/2017 1100   ALT 15 02/21/2018 1132   ALT 15 02/14/2017 1100   BILITOT 0.3 02/21/2018 1132   BILITOT 0.33 02/14/2017 1100     RADIOGRAPHIC STUDIES: No results found. RRT FINAL DIAGNOSIS ASSESSMENT AND PLAN:  This is a very pleasant 62 years old African-American female with polyclonal gammopathy of undetermined significance and has been on observation for several years. The patient is feeling fine today with no concerning complaints. Repeat myeloma panel showed further increase in the free kappa light chain.  The patient is currently on the list for renal transplant.  She was taken off the list for few months until she has reasonable weight loss. I recommended for the patient to continue on observation for now with repeat myeloma panel in 6 months. I do not see a reason to start this patient on treatment for her polyclonal gammopathy. For hypertension she was advised to take her blood pressure medication and to monitor it closely at home. She was advised to call immediately if she has any concerning symptoms in the interval. The patient voices understanding of current disease status and treatment options and is in agreement with the current care plan. All questions were answered. The patient knows to call the clinic with any problems, questions or concerns. We can certainly see the patient much sooner if necessary. I spent 10  minutes counseling the patient face to face. The total time spent in the appointment was 15 minutes. Disclaimer: This note was dictated with voice recognition software. Similar sounding words can inadvertently be transcribed and may not be corrected upon review.

## 2018-02-28 NOTE — Telephone Encounter (Signed)
Printed avs and calender of upcoming appointment. Per 12/12 los 

## 2018-03-05 DIAGNOSIS — E119 Type 2 diabetes mellitus without complications: Secondary | ICD-10-CM | POA: Diagnosis not present

## 2018-03-06 DIAGNOSIS — E119 Type 2 diabetes mellitus without complications: Secondary | ICD-10-CM | POA: Diagnosis not present

## 2018-03-09 DIAGNOSIS — H524 Presbyopia: Secondary | ICD-10-CM | POA: Diagnosis not present

## 2018-03-09 DIAGNOSIS — H52209 Unspecified astigmatism, unspecified eye: Secondary | ICD-10-CM | POA: Diagnosis not present

## 2018-03-09 DIAGNOSIS — H5203 Hypermetropia, bilateral: Secondary | ICD-10-CM | POA: Diagnosis not present

## 2018-03-26 DIAGNOSIS — N183 Chronic kidney disease, stage 3 (moderate): Secondary | ICD-10-CM | POA: Diagnosis not present

## 2018-04-01 DIAGNOSIS — I129 Hypertensive chronic kidney disease with stage 1 through stage 4 chronic kidney disease, or unspecified chronic kidney disease: Secondary | ICD-10-CM | POA: Diagnosis not present

## 2018-04-01 DIAGNOSIS — E1122 Type 2 diabetes mellitus with diabetic chronic kidney disease: Secondary | ICD-10-CM | POA: Diagnosis not present

## 2018-04-01 DIAGNOSIS — N185 Chronic kidney disease, stage 5: Secondary | ICD-10-CM | POA: Diagnosis not present

## 2018-04-01 DIAGNOSIS — I120 Hypertensive chronic kidney disease with stage 5 chronic kidney disease or end stage renal disease: Secondary | ICD-10-CM | POA: Diagnosis not present

## 2018-04-01 DIAGNOSIS — Z01818 Encounter for other preprocedural examination: Secondary | ICD-10-CM | POA: Diagnosis not present

## 2018-04-01 DIAGNOSIS — Z79899 Other long term (current) drug therapy: Secondary | ICD-10-CM | POA: Diagnosis not present

## 2018-04-01 DIAGNOSIS — Z8249 Family history of ischemic heart disease and other diseases of the circulatory system: Secondary | ICD-10-CM | POA: Diagnosis not present

## 2018-04-01 DIAGNOSIS — D472 Monoclonal gammopathy: Secondary | ICD-10-CM | POA: Diagnosis not present

## 2018-04-01 DIAGNOSIS — I517 Cardiomegaly: Secondary | ICD-10-CM | POA: Diagnosis not present

## 2018-04-01 DIAGNOSIS — E1110 Type 2 diabetes mellitus with ketoacidosis without coma: Secondary | ICD-10-CM | POA: Diagnosis not present

## 2018-04-01 DIAGNOSIS — E039 Hypothyroidism, unspecified: Secondary | ICD-10-CM | POA: Diagnosis not present

## 2018-04-05 DIAGNOSIS — E119 Type 2 diabetes mellitus without complications: Secondary | ICD-10-CM | POA: Diagnosis not present

## 2018-04-22 DIAGNOSIS — N183 Chronic kidney disease, stage 3 (moderate): Secondary | ICD-10-CM | POA: Diagnosis not present

## 2018-04-25 DIAGNOSIS — E1122 Type 2 diabetes mellitus with diabetic chronic kidney disease: Secondary | ICD-10-CM | POA: Diagnosis not present

## 2018-04-25 DIAGNOSIS — Z7984 Long term (current) use of oral hypoglycemic drugs: Secondary | ICD-10-CM | POA: Diagnosis not present

## 2018-04-25 DIAGNOSIS — N185 Chronic kidney disease, stage 5: Secondary | ICD-10-CM | POA: Diagnosis not present

## 2018-04-25 DIAGNOSIS — D472 Monoclonal gammopathy: Secondary | ICD-10-CM | POA: Diagnosis not present

## 2018-04-25 DIAGNOSIS — I129 Hypertensive chronic kidney disease with stage 1 through stage 4 chronic kidney disease, or unspecified chronic kidney disease: Secondary | ICD-10-CM | POA: Diagnosis not present

## 2018-04-25 DIAGNOSIS — Z6841 Body Mass Index (BMI) 40.0 and over, adult: Secondary | ICD-10-CM | POA: Diagnosis not present

## 2018-04-25 DIAGNOSIS — E039 Hypothyroidism, unspecified: Secondary | ICD-10-CM | POA: Diagnosis not present

## 2018-04-25 DIAGNOSIS — M109 Gout, unspecified: Secondary | ICD-10-CM | POA: Diagnosis not present

## 2018-04-25 DIAGNOSIS — E782 Mixed hyperlipidemia: Secondary | ICD-10-CM | POA: Diagnosis not present

## 2018-04-25 DIAGNOSIS — I10 Essential (primary) hypertension: Secondary | ICD-10-CM | POA: Diagnosis not present

## 2018-04-25 DIAGNOSIS — E785 Hyperlipidemia, unspecified: Secondary | ICD-10-CM | POA: Diagnosis not present

## 2018-04-25 DIAGNOSIS — N183 Chronic kidney disease, stage 3 (moderate): Secondary | ICD-10-CM | POA: Diagnosis not present

## 2018-05-21 DIAGNOSIS — N183 Chronic kidney disease, stage 3 (moderate): Secondary | ICD-10-CM | POA: Diagnosis not present

## 2018-05-22 DIAGNOSIS — Z803 Family history of malignant neoplasm of breast: Secondary | ICD-10-CM | POA: Diagnosis not present

## 2018-05-22 DIAGNOSIS — Z1231 Encounter for screening mammogram for malignant neoplasm of breast: Secondary | ICD-10-CM | POA: Diagnosis not present

## 2018-05-28 DIAGNOSIS — E119 Type 2 diabetes mellitus without complications: Secondary | ICD-10-CM | POA: Diagnosis not present

## 2018-06-24 DIAGNOSIS — N183 Chronic kidney disease, stage 3 (moderate): Secondary | ICD-10-CM | POA: Diagnosis not present

## 2018-06-26 DIAGNOSIS — E119 Type 2 diabetes mellitus without complications: Secondary | ICD-10-CM | POA: Diagnosis not present

## 2018-07-24 DIAGNOSIS — N183 Chronic kidney disease, stage 3 (moderate): Secondary | ICD-10-CM | POA: Diagnosis not present

## 2018-07-30 DIAGNOSIS — E119 Type 2 diabetes mellitus without complications: Secondary | ICD-10-CM | POA: Diagnosis not present

## 2018-08-20 DIAGNOSIS — N183 Chronic kidney disease, stage 3 (moderate): Secondary | ICD-10-CM | POA: Diagnosis not present

## 2018-08-29 ENCOUNTER — Other Ambulatory Visit: Payer: Self-pay

## 2018-08-29 ENCOUNTER — Inpatient Hospital Stay: Payer: Medicare HMO | Attending: Internal Medicine

## 2018-08-29 ENCOUNTER — Telehealth: Payer: Self-pay | Admitting: *Deleted

## 2018-08-29 DIAGNOSIS — N189 Chronic kidney disease, unspecified: Secondary | ICD-10-CM | POA: Insufficient documentation

## 2018-08-29 DIAGNOSIS — Z7982 Long term (current) use of aspirin: Secondary | ICD-10-CM | POA: Diagnosis not present

## 2018-08-29 DIAGNOSIS — D890 Polyclonal hypergammaglobulinemia: Secondary | ICD-10-CM | POA: Insufficient documentation

## 2018-08-29 DIAGNOSIS — E1122 Type 2 diabetes mellitus with diabetic chronic kidney disease: Secondary | ICD-10-CM | POA: Insufficient documentation

## 2018-08-29 DIAGNOSIS — Z79899 Other long term (current) drug therapy: Secondary | ICD-10-CM | POA: Diagnosis not present

## 2018-08-29 DIAGNOSIS — M129 Arthropathy, unspecified: Secondary | ICD-10-CM | POA: Insufficient documentation

## 2018-08-29 DIAGNOSIS — M109 Gout, unspecified: Secondary | ICD-10-CM | POA: Insufficient documentation

## 2018-08-29 DIAGNOSIS — I129 Hypertensive chronic kidney disease with stage 1 through stage 4 chronic kidney disease, or unspecified chronic kidney disease: Secondary | ICD-10-CM | POA: Diagnosis not present

## 2018-08-29 LAB — CBC WITH DIFFERENTIAL (CANCER CENTER ONLY)
Abs Immature Granulocytes: 0.01 10*3/uL (ref 0.00–0.07)
Basophils Absolute: 0 10*3/uL (ref 0.0–0.1)
Basophils Relative: 1 %
Eosinophils Absolute: 0.2 10*3/uL (ref 0.0–0.5)
Eosinophils Relative: 4 %
HCT: 31.9 % — ABNORMAL LOW (ref 36.0–46.0)
Hemoglobin: 9.6 g/dL — ABNORMAL LOW (ref 12.0–15.0)
Immature Granulocytes: 0 %
Lymphocytes Relative: 24 %
Lymphs Abs: 1 10*3/uL (ref 0.7–4.0)
MCH: 27 pg (ref 26.0–34.0)
MCHC: 30.1 g/dL (ref 30.0–36.0)
MCV: 89.9 fL (ref 80.0–100.0)
Monocytes Absolute: 0.3 10*3/uL (ref 0.1–1.0)
Monocytes Relative: 6 %
Neutro Abs: 2.7 10*3/uL (ref 1.7–7.7)
Neutrophils Relative %: 65 %
Platelet Count: 173 10*3/uL (ref 150–400)
RBC: 3.55 MIL/uL — ABNORMAL LOW (ref 3.87–5.11)
RDW: 14.9 % (ref 11.5–15.5)
WBC Count: 4.1 10*3/uL (ref 4.0–10.5)
nRBC: 0 % (ref 0.0–0.2)

## 2018-08-29 LAB — CMP (CANCER CENTER ONLY)
ALT: 15 U/L (ref 0–44)
AST: 18 U/L (ref 15–41)
Albumin: 2.9 g/dL — ABNORMAL LOW (ref 3.5–5.0)
Alkaline Phosphatase: 99 U/L (ref 38–126)
Anion gap: 8 (ref 5–15)
BUN: 38 mg/dL — ABNORMAL HIGH (ref 8–23)
CO2: 23 mmol/L (ref 22–32)
Calcium: 10.7 mg/dL — ABNORMAL HIGH (ref 8.9–10.3)
Chloride: 108 mmol/L (ref 98–111)
Creatinine: 4.29 mg/dL (ref 0.44–1.00)
GFR, Est AFR Am: 12 mL/min — ABNORMAL LOW (ref >60)
GFR, Estimated: 10 mL/min — ABNORMAL LOW (ref >60)
Glucose, Bld: 156 mg/dL — ABNORMAL HIGH (ref 70–99)
Potassium: 4.7 mmol/L (ref 3.5–5.1)
Sodium: 139 mmol/L (ref 135–145)
Total Bilirubin: 0.3 mg/dL (ref 0.3–1.2)
Total Protein: 7 g/dL (ref 6.5–8.1)

## 2018-08-29 LAB — LACTATE DEHYDROGENASE: LDH: 161 U/L (ref 98–192)

## 2018-08-29 NOTE — Telephone Encounter (Signed)
Received call from lab with creatinine of 4.29. This appears to be baseline for pt.

## 2018-08-30 DIAGNOSIS — E119 Type 2 diabetes mellitus without complications: Secondary | ICD-10-CM | POA: Diagnosis not present

## 2018-08-30 LAB — IGG, IGA, IGM
IgA: 412 mg/dL — ABNORMAL HIGH (ref 87–352)
IgG (Immunoglobin G), Serum: 1237 mg/dL (ref 586–1602)
IgM (Immunoglobulin M), Srm: 89 mg/dL (ref 26–217)

## 2018-08-30 LAB — KAPPA/LAMBDA LIGHT CHAINS
Kappa free light chain: 262.9 mg/L — ABNORMAL HIGH (ref 3.3–19.4)
Kappa, lambda light chain ratio: 3.06 — ABNORMAL HIGH (ref 0.26–1.65)
Lambda free light chains: 85.8 mg/L — ABNORMAL HIGH (ref 5.7–26.3)

## 2018-08-30 LAB — BETA 2 MICROGLOBULIN, SERUM: Beta-2 Microglobulin: 10.9 mg/L — ABNORMAL HIGH (ref 0.6–2.4)

## 2018-09-05 ENCOUNTER — Telehealth: Payer: Self-pay | Admitting: Internal Medicine

## 2018-09-05 ENCOUNTER — Inpatient Hospital Stay (HOSPITAL_BASED_OUTPATIENT_CLINIC_OR_DEPARTMENT_OTHER): Payer: Medicare HMO | Admitting: Internal Medicine

## 2018-09-05 ENCOUNTER — Other Ambulatory Visit: Payer: Self-pay

## 2018-09-05 ENCOUNTER — Encounter: Payer: Self-pay | Admitting: Internal Medicine

## 2018-09-05 VITALS — BP 147/71 | HR 95 | Temp 98.5°F | Resp 20 | Ht 69.0 in | Wt 276.4 lb

## 2018-09-05 DIAGNOSIS — D890 Polyclonal hypergammaglobulinemia: Secondary | ICD-10-CM

## 2018-09-05 DIAGNOSIS — D472 Monoclonal gammopathy: Secondary | ICD-10-CM

## 2018-09-05 DIAGNOSIS — N2889 Other specified disorders of kidney and ureter: Secondary | ICD-10-CM

## 2018-09-05 DIAGNOSIS — M129 Arthropathy, unspecified: Secondary | ICD-10-CM | POA: Diagnosis not present

## 2018-09-05 DIAGNOSIS — M109 Gout, unspecified: Secondary | ICD-10-CM

## 2018-09-05 DIAGNOSIS — Z79899 Other long term (current) drug therapy: Secondary | ICD-10-CM | POA: Diagnosis not present

## 2018-09-05 DIAGNOSIS — N184 Chronic kidney disease, stage 4 (severe): Secondary | ICD-10-CM | POA: Insufficient documentation

## 2018-09-05 DIAGNOSIS — E1122 Type 2 diabetes mellitus with diabetic chronic kidney disease: Secondary | ICD-10-CM

## 2018-09-05 DIAGNOSIS — Z7982 Long term (current) use of aspirin: Secondary | ICD-10-CM | POA: Diagnosis not present

## 2018-09-05 DIAGNOSIS — I129 Hypertensive chronic kidney disease with stage 1 through stage 4 chronic kidney disease, or unspecified chronic kidney disease: Secondary | ICD-10-CM

## 2018-09-05 DIAGNOSIS — N189 Chronic kidney disease, unspecified: Secondary | ICD-10-CM | POA: Diagnosis not present

## 2018-09-05 NOTE — Progress Notes (Signed)
[place] Telephone:(336) [phone]   Fax:(336) [phone]  OFFICE PROGRESS NOTE  Wenda Low, MD [address]  [place] [address]  DIAGNOSIS: Polyclonal gammopathy of undetermined significance  PRIOR THERAPY: None  CURRENT THERAPY: Observation.  INTERVAL HISTORY: Kristen English 63 y.o. female returns to the clinic today for follow-up visit.  The patient is feeling fine today with no concerning complaints.  She denied having any chest pain, shortness of breath, cough or hemoptysis.  She denied having any fever or chills.  She has no nausea, vomiting, diarrhea or constipation.  She has no recent headache or visual changes.  She is trying hard to lose weight so she can be a candidate for the renal transplant.  The patient had repeat myeloma panel performed recently and she is here for evaluation and discussion of her lab results and recommendation regarding her condition.  MEDICAL HISTORY: Past Medical History:  Diagnosis Date  . Arthritis   . Chronic kidney disease    protein in urine  . Diabetes mellitus without complication ([place])   . Gout   . Hypertension     ALLERGIES:  is allergic to morphine and related.  MEDICATIONS:  Current Outpatient Medications  Medication Sig Dispense Refill  . amLODipine (NORVASC) 10 MG tablet Take 10 mg by mouth at bedtime.     Marland Kitchen aspirin 81 MG tablet Take 81 mg by mouth at bedtime.     Marland Kitchen atorvastatin (LIPITOR) 40 MG tablet     . BOOSTRIX 5-2.5-18.5 injection     . carvedilol (COREG) 25 MG tablet Take 25 mg by mouth 2 (two) times daily with a meal.     . cholecalciferol (VITAMIN D) 1000 UNITS tablet Take 2,000 Units by mouth every morning.     . colchicine 0.6 MG tablet Take 0.6 mg by mouth every 12 (twelve) hours as needed (gout.).     Marland Kitchen ferrous sulfate (FERROUSUL) 325 (65 FE) MG tablet Take 325 mg by mouth.    . ferrous sulfate 325 (65 FE) MG tablet Take 325 mg by mouth daily with breakfast.    .  furosemide (LASIX) 40 MG tablet Take 40 mg by mouth 2 (two) times daily as needed for fluid.    Marland Kitchen glipiZIDE (GLUCOTROL) 5 MG tablet Take 5 mg by mouth 3 (three) times daily with meals.    . hydrOXYzine (ATARAX/VISTARIL) 25 MG tablet Take 25 mg by mouth at bedtime as needed for itching.     . Liraglutide (VICTOZA) 18 MG/3ML SOPN Inject 1.8 mg into the skin daily at 12 noon.     . lovastatin (MEVACOR) 10 MG tablet Take 10 mg by mouth at bedtime.     Marland Kitchen NOVOFINE 32G X 6 MM MISC      No current facility-administered medications for this visit.     SURGICAL HISTORY:  Past Surgical History:  Procedure Laterality Date  . COLONOSCOPY WITH PROPOFOL N/A 05/11/2014   Procedure: COLONOSCOPY WITH PROPOFOL;  Surgeon: Garlan Fair, MD;  Location: [facility];  Service: Endoscopy;  Laterality: N/A;  . FOOT SURGERY Left 2007  . JOINT REPLACEMENT  2009   bilateral knees  . TOTAL KNEE ARTHROPLASTY     bilateral    REVIEW OF SYSTEMS:  A comprehensive review of systems was negative except for: Constitutional: positive for fatigue   PHYSICAL EXAMINATION: General appearance: alert, cooperative, fatigued and no distress Head: Normocephalic, without obvious abnormality, atraumatic Neck: no adenopathy, no JVD,  supple, symmetrical, trachea midline and thyroid not enlarged, symmetric, no tenderness/mass/nodules Lymph nodes: Cervical, supraclavicular, and axillary nodes normal. Resp: clear to auscultation bilaterally Back: symmetric, no curvature. ROM normal. No CVA tenderness. Cardio: regular rate and rhythm, S1, S2 normal, no murmur, click, rub or gallop GI: soft, non-tender; bowel sounds normal; no masses,  no organomegaly Extremities: extremities normal, atraumatic, no cyanosis or edema  ECOG PERFORMANCE STATUS: 1 - Symptomatic but completely ambulatory  Blood pressure (!) 147/71, pulse 95, temperature 98.5 F (36.9 C), temperature source Oral, resp. rate 20, height 5\' 9"  (1.753 m), weight 276 lb 6.4  oz (125.4 kg), SpO2 100 %.   LABORATORY DATA: Lab Results  Component Value Date   WBC 4.1 08/29/2018   HGB 9.6 (L) 08/29/2018   HCT 31.9 (L) 08/29/2018   MCV 89.9 08/29/2018   PLT 173 08/29/2018      Chemistry      Component Value Date/Time   NA 139 08/29/2018 1112   NA 142 02/14/2017 1100   K 4.7 08/29/2018 1112   K 4.4 02/14/2017 1100   CL 108 08/29/2018 1112   CO2 23 08/29/2018 1112   CO2 24 02/14/2017 1100   BUN 38 (H) 08/29/2018 1112   BUN 43.2 (H) 02/14/2017 1100   CREATININE 4.29 (HH) 08/29/2018 1112   CREATININE 4.2 (HH) 02/14/2017 1100      Component Value Date/Time   CALCIUM 10.7 (H) 08/29/2018 1112   CALCIUM 9.9 02/14/2017 1100   ALKPHOS 99 08/29/2018 1112   ALKPHOS 108 02/14/2017 1100   AST 18 08/29/2018 1112   AST 21 02/14/2017 1100   ALT 15 08/29/2018 1112   ALT 15 02/14/2017 1100   BILITOT 0.3 08/29/2018 1112   BILITOT 0.33 02/14/2017 1100     RADIOGRAPHIC STUDIES: No results found. RRT FINAL DIAGNOSIS ASSESSMENT AND PLAN:  This is a very pleasant 63 years old African-American female with polyclonal gammopathy of undetermined significance and has been on observation for several years. The patient is currently on observation and she is feeling fine today. Repeat myeloma panel showed a stable but elevated free kappa light chain.  Her disease is consistent with polyclonal gammopathy rather than monoclonal gammopathy. I recommended for the patient to continue on observation with repeat myeloma panel in 6 months. She still required to have reasonable weight loss before enrollment on the renal transplant list again. The patient was advised to call immediately if she has any concerning symptoms in the interval. The patient voices understanding of current disease status and treatment options and is in agreement with the current care plan. All questions were answered. The patient knows to call the clinic with any problems, questions or concerns. We can  certainly see the patient much sooner if necessary. I spent 10 minutes counseling the patient face to face. The total time spent in the appointment was 15 minutes. Disclaimer: This note was dictated with voice recognition software. Similar sounding words can inadvertently be transcribed and may not be corrected upon review.

## 2018-09-05 NOTE — Telephone Encounter (Signed)
Scheduled per los. Gave avs and calendar  

## 2018-09-24 DIAGNOSIS — N183 Chronic kidney disease, stage 3 (moderate): Secondary | ICD-10-CM | POA: Diagnosis not present

## 2018-10-08 DIAGNOSIS — E119 Type 2 diabetes mellitus without complications: Secondary | ICD-10-CM | POA: Diagnosis not present

## 2018-10-14 DIAGNOSIS — E1122 Type 2 diabetes mellitus with diabetic chronic kidney disease: Secondary | ICD-10-CM | POA: Diagnosis not present

## 2018-10-14 DIAGNOSIS — Z6841 Body Mass Index (BMI) 40.0 and over, adult: Secondary | ICD-10-CM | POA: Diagnosis not present

## 2018-10-14 DIAGNOSIS — N183 Chronic kidney disease, stage 3 (moderate): Secondary | ICD-10-CM | POA: Diagnosis not present

## 2018-10-14 DIAGNOSIS — M109 Gout, unspecified: Secondary | ICD-10-CM | POA: Diagnosis not present

## 2018-10-14 DIAGNOSIS — I129 Hypertensive chronic kidney disease with stage 1 through stage 4 chronic kidney disease, or unspecified chronic kidney disease: Secondary | ICD-10-CM | POA: Diagnosis not present

## 2018-10-14 DIAGNOSIS — E785 Hyperlipidemia, unspecified: Secondary | ICD-10-CM | POA: Diagnosis not present

## 2018-10-22 DIAGNOSIS — N183 Chronic kidney disease, stage 3 (moderate): Secondary | ICD-10-CM | POA: Diagnosis not present

## 2018-10-28 DIAGNOSIS — D649 Anemia, unspecified: Secondary | ICD-10-CM | POA: Diagnosis not present

## 2018-10-28 DIAGNOSIS — E1122 Type 2 diabetes mellitus with diabetic chronic kidney disease: Secondary | ICD-10-CM | POA: Diagnosis not present

## 2018-10-28 DIAGNOSIS — Z1389 Encounter for screening for other disorder: Secondary | ICD-10-CM | POA: Diagnosis not present

## 2018-10-28 DIAGNOSIS — Z0000 Encounter for general adult medical examination without abnormal findings: Secondary | ICD-10-CM | POA: Diagnosis not present

## 2018-10-28 DIAGNOSIS — D472 Monoclonal gammopathy: Secondary | ICD-10-CM | POA: Diagnosis not present

## 2018-10-28 DIAGNOSIS — N184 Chronic kidney disease, stage 4 (severe): Secondary | ICD-10-CM | POA: Diagnosis not present

## 2018-10-28 DIAGNOSIS — I10 Essential (primary) hypertension: Secondary | ICD-10-CM | POA: Diagnosis not present

## 2018-10-28 DIAGNOSIS — E782 Mixed hyperlipidemia: Secondary | ICD-10-CM | POA: Diagnosis not present

## 2018-11-04 DIAGNOSIS — E1122 Type 2 diabetes mellitus with diabetic chronic kidney disease: Secondary | ICD-10-CM | POA: Diagnosis not present

## 2018-11-04 DIAGNOSIS — E782 Mixed hyperlipidemia: Secondary | ICD-10-CM | POA: Diagnosis not present

## 2018-11-04 DIAGNOSIS — E039 Hypothyroidism, unspecified: Secondary | ICD-10-CM | POA: Diagnosis not present

## 2018-11-04 DIAGNOSIS — I10 Essential (primary) hypertension: Secondary | ICD-10-CM | POA: Diagnosis not present

## 2018-11-04 DIAGNOSIS — Z794 Long term (current) use of insulin: Secondary | ICD-10-CM | POA: Diagnosis not present

## 2018-11-21 DIAGNOSIS — E119 Type 2 diabetes mellitus without complications: Secondary | ICD-10-CM | POA: Diagnosis not present

## 2018-11-26 DIAGNOSIS — N183 Chronic kidney disease, stage 3 (moderate): Secondary | ICD-10-CM | POA: Diagnosis not present

## 2018-12-23 DIAGNOSIS — N183 Chronic kidney disease, stage 3 unspecified: Secondary | ICD-10-CM | POA: Diagnosis not present

## 2019-01-22 DIAGNOSIS — E1122 Type 2 diabetes mellitus with diabetic chronic kidney disease: Secondary | ICD-10-CM | POA: Diagnosis not present

## 2019-01-22 DIAGNOSIS — M109 Gout, unspecified: Secondary | ICD-10-CM | POA: Diagnosis not present

## 2019-01-22 DIAGNOSIS — Z6841 Body Mass Index (BMI) 40.0 and over, adult: Secondary | ICD-10-CM | POA: Diagnosis not present

## 2019-01-22 DIAGNOSIS — N183 Chronic kidney disease, stage 3 unspecified: Secondary | ICD-10-CM | POA: Diagnosis not present

## 2019-01-22 DIAGNOSIS — I129 Hypertensive chronic kidney disease with stage 1 through stage 4 chronic kidney disease, or unspecified chronic kidney disease: Secondary | ICD-10-CM | POA: Diagnosis not present

## 2019-01-22 DIAGNOSIS — D631 Anemia in chronic kidney disease: Secondary | ICD-10-CM | POA: Diagnosis not present

## 2019-01-22 DIAGNOSIS — E785 Hyperlipidemia, unspecified: Secondary | ICD-10-CM | POA: Diagnosis not present

## 2019-01-22 DIAGNOSIS — Z23 Encounter for immunization: Secondary | ICD-10-CM | POA: Diagnosis not present

## 2019-02-24 DIAGNOSIS — N185 Chronic kidney disease, stage 5: Secondary | ICD-10-CM | POA: Diagnosis not present

## 2019-03-08 DIAGNOSIS — E119 Type 2 diabetes mellitus without complications: Secondary | ICD-10-CM | POA: Diagnosis not present

## 2019-03-10 ENCOUNTER — Inpatient Hospital Stay: Payer: Medicare HMO

## 2019-03-10 ENCOUNTER — Inpatient Hospital Stay: Payer: Medicare HMO | Attending: Internal Medicine | Admitting: Internal Medicine

## 2019-03-24 DIAGNOSIS — N185 Chronic kidney disease, stage 5: Secondary | ICD-10-CM | POA: Diagnosis not present

## 2019-04-21 DIAGNOSIS — Z01419 Encounter for gynecological examination (general) (routine) without abnormal findings: Secondary | ICD-10-CM | POA: Diagnosis not present

## 2019-04-22 DIAGNOSIS — N185 Chronic kidney disease, stage 5: Secondary | ICD-10-CM | POA: Diagnosis not present

## 2019-05-05 DIAGNOSIS — E1122 Type 2 diabetes mellitus with diabetic chronic kidney disease: Secondary | ICD-10-CM | POA: Diagnosis not present

## 2019-05-05 DIAGNOSIS — I129 Hypertensive chronic kidney disease with stage 1 through stage 4 chronic kidney disease, or unspecified chronic kidney disease: Secondary | ICD-10-CM | POA: Diagnosis not present

## 2019-05-05 DIAGNOSIS — N185 Chronic kidney disease, stage 5: Secondary | ICD-10-CM | POA: Diagnosis not present

## 2019-05-05 DIAGNOSIS — N2889 Other specified disorders of kidney and ureter: Secondary | ICD-10-CM | POA: Diagnosis not present

## 2019-05-05 DIAGNOSIS — R6889 Other general symptoms and signs: Secondary | ICD-10-CM | POA: Diagnosis not present

## 2019-05-05 DIAGNOSIS — T80319A ABO incompatibility with hemolytic transfusion reaction, unspecified, initial encounter: Secondary | ICD-10-CM | POA: Diagnosis not present

## 2019-05-05 DIAGNOSIS — Z7984 Long term (current) use of oral hypoglycemic drugs: Secondary | ICD-10-CM | POA: Diagnosis not present

## 2019-05-05 DIAGNOSIS — Z01818 Encounter for other preprocedural examination: Secondary | ICD-10-CM | POA: Diagnosis not present

## 2019-05-07 DIAGNOSIS — Z6841 Body Mass Index (BMI) 40.0 and over, adult: Secondary | ICD-10-CM | POA: Diagnosis not present

## 2019-05-07 DIAGNOSIS — D472 Monoclonal gammopathy: Secondary | ICD-10-CM | POA: Diagnosis not present

## 2019-05-07 DIAGNOSIS — I10 Essential (primary) hypertension: Secondary | ICD-10-CM | POA: Diagnosis not present

## 2019-05-07 DIAGNOSIS — M109 Gout, unspecified: Secondary | ICD-10-CM | POA: Diagnosis not present

## 2019-05-07 DIAGNOSIS — E1122 Type 2 diabetes mellitus with diabetic chronic kidney disease: Secondary | ICD-10-CM | POA: Diagnosis not present

## 2019-05-07 DIAGNOSIS — N185 Chronic kidney disease, stage 5: Secondary | ICD-10-CM | POA: Diagnosis not present

## 2019-05-07 DIAGNOSIS — E782 Mixed hyperlipidemia: Secondary | ICD-10-CM | POA: Diagnosis not present

## 2019-05-07 DIAGNOSIS — Z1389 Encounter for screening for other disorder: Secondary | ICD-10-CM | POA: Diagnosis not present

## 2019-05-21 DIAGNOSIS — N281 Cyst of kidney, acquired: Secondary | ICD-10-CM | POA: Diagnosis not present

## 2019-05-21 DIAGNOSIS — N2889 Other specified disorders of kidney and ureter: Secondary | ICD-10-CM | POA: Diagnosis not present

## 2019-05-28 DIAGNOSIS — Z1231 Encounter for screening mammogram for malignant neoplasm of breast: Secondary | ICD-10-CM | POA: Diagnosis not present

## 2019-06-16 DIAGNOSIS — E1122 Type 2 diabetes mellitus with diabetic chronic kidney disease: Secondary | ICD-10-CM | POA: Diagnosis not present

## 2019-06-16 DIAGNOSIS — Z6841 Body Mass Index (BMI) 40.0 and over, adult: Secondary | ICD-10-CM | POA: Diagnosis not present

## 2019-06-16 DIAGNOSIS — N2581 Secondary hyperparathyroidism of renal origin: Secondary | ICD-10-CM | POA: Diagnosis not present

## 2019-06-16 DIAGNOSIS — D631 Anemia in chronic kidney disease: Secondary | ICD-10-CM | POA: Diagnosis not present

## 2019-06-16 DIAGNOSIS — N183 Chronic kidney disease, stage 3 unspecified: Secondary | ICD-10-CM | POA: Diagnosis not present

## 2019-06-16 DIAGNOSIS — I129 Hypertensive chronic kidney disease with stage 1 through stage 4 chronic kidney disease, or unspecified chronic kidney disease: Secondary | ICD-10-CM | POA: Diagnosis not present

## 2019-06-16 DIAGNOSIS — M109 Gout, unspecified: Secondary | ICD-10-CM | POA: Diagnosis not present

## 2019-06-16 DIAGNOSIS — E785 Hyperlipidemia, unspecified: Secondary | ICD-10-CM | POA: Diagnosis not present

## 2019-06-30 ENCOUNTER — Other Ambulatory Visit (HOSPITAL_COMMUNITY): Payer: Self-pay | Admitting: *Deleted

## 2019-06-30 NOTE — Discharge Instructions (Signed)
Ferumoxytol injection What is this medicine? FERUMOXYTOL is an iron complex. Iron is used to make healthy red blood cells, which carry oxygen and nutrients throughout the body. This medicine is used to treat iron deficiency anemia. This medicine may be used for other purposes; ask your health care provider or pharmacist if you have questions. COMMON BRAND NAME(S): Feraheme What should I tell my health care provider before I take this medicine? They need to know if you have any of these conditions:  anemia not caused by low iron levels  high levels of iron in the blood  magnetic resonance imaging (MRI) test scheduled  an unusual or allergic reaction to iron, other medicines, foods, dyes, or preservatives  pregnant or trying to get pregnant  breast-feeding How should I use this medicine? This medicine is for injection into a vein. It is given by a health care professional in a hospital or clinic setting. Talk to your pediatrician regarding the use of this medicine in children. Special care may be needed. Overdosage: If you think you have taken too much of this medicine contact a poison control center or emergency room at once. NOTE: This medicine is only for you. Do not share this medicine with others. What if I miss a dose? It is important not to miss your dose. Call your doctor or health care professional if you are unable to keep an appointment. What may interact with this medicine? This medicine may interact with the following medications:  other iron products This list may not describe all possible interactions. Give your health care provider a list of all the medicines, herbs, non-prescription drugs, or dietary supplements you use. Also tell them if you smoke, drink alcohol, or use illegal drugs. Some items may interact with your medicine. What should I watch for while using this medicine? Visit your doctor or healthcare professional regularly. Tell your doctor or healthcare  professional if your symptoms do not start to get better or if they get worse. You may need blood work done while you are taking this medicine. You may need to follow a special diet. Talk to your doctor. Foods that contain iron include: whole grains/cereals, dried fruits, beans, or peas, leafy green vegetables, and organ meats (liver, kidney). What side effects may I notice from receiving this medicine? Side effects that you should report to your doctor or health care professional as soon as possible:  allergic reactions like skin rash, itching or hives, swelling of the face, lips, or tongue  breathing problems  changes in blood pressure  feeling faint or lightheaded, falls  fever or chills  flushing, sweating, or hot feelings  swelling of the ankles or feet Side effects that usually do not require medical attention (report to your doctor or health care professional if they continue or are bothersome):  diarrhea  headache  nausea, vomiting  stomach pain This list may not describe all possible side effects. Call your doctor for medical advice about side effects. You may report side effects to FDA at 1-800-FDA-1088. Where should I keep my medicine? This drug is given in a hospital or clinic and will not be stored at home. NOTE: This sheet is a summary. It may not cover all possible information. If you have questions about this medicine, talk to your doctor, pharmacist, or health care provider.  2020 Elsevier/Gold Standard (2016-04-24 20:21:10) Epoetin Alfa injection What is this medicine? EPOETIN ALFA (e POE e tin AL fa) helps your body make more red blood cells. This  medicine is used to treat anemia caused by chronic kidney disease, cancer chemotherapy, or HIV-therapy. It may also be used before surgery if you have anemia. This medicine may be used for other purposes; ask your health care provider or pharmacist if you have questions. COMMON BRAND NAME(S): Epogen, Procrit,  Retacrit What should I tell my health care provider before I take this medicine? They need to know if you have any of these conditions:  cancer  heart disease  high blood pressure  history of blood clots  history of stroke  low levels of folate, iron, or vitamin B12 in the blood  seizures  an unusual or allergic reaction to erythropoietin, albumin, benzyl alcohol, hamster proteins, other medicines, foods, dyes, or preservatives  pregnant or trying to get pregnant  breast-feeding How should I use this medicine? This medicine is for injection into a vein or under the skin. It is usually given by a health care professional in a hospital or clinic setting. If you get this medicine at home, you will be taught how to prepare and give this medicine. Use exactly as directed. Take your medicine at regular intervals. Do not take your medicine more often than directed. It is important that you put your used needles and syringes in a special sharps container. Do not put them in a trash can. If you do not have a sharps container, call your pharmacist or healthcare provider to get one. A special MedGuide will be given to you by the pharmacist with each prescription and refill. Be sure to read this information carefully each time. Talk to your pediatrician regarding the use of this medicine in children. While this drug may be prescribed for selected conditions, precautions do apply. Overdosage: If you think you have taken too much of this medicine contact a poison control center or emergency room at once. NOTE: This medicine is only for you. Do not share this medicine with others. What if I miss a dose? If you miss a dose, take it as soon as you can. If it is almost time for your next dose, take only that dose. Do not take double or extra doses. What may interact with this medicine? Interactions have not been studied. This list may not describe all possible interactions. Give your health care  provider a list of all the medicines, herbs, non-prescription drugs, or dietary supplements you use. Also tell them if you smoke, drink alcohol, or use illegal drugs. Some items may interact with your medicine. What should I watch for while using this medicine? Your condition will be monitored carefully while you are receiving this medicine. You may need blood work done while you are taking this medicine. This medicine may cause a decrease in vitamin B6. You should make sure that you get enough vitamin B6 while you are taking this medicine. Discuss the foods you eat and the vitamins you take with your health care professional. What side effects may I notice from receiving this medicine? Side effects that you should report to your doctor or health care professional as soon as possible:  allergic reactions like skin rash, itching or hives, swelling of the face, lips, or tongue  seizures  signs and symptoms of a blood clot such as breathing problems; changes in vision; chest pain; severe, sudden headache; pain, swelling, warmth in the leg; trouble speaking; sudden numbness or weakness of the face, arm or leg  signs and symptoms of a stroke like changes in vision; confusion; trouble speaking or understanding;   severe headaches; sudden numbness or weakness of the face, arm or leg; trouble walking; dizziness; loss of balance or coordination Side effects that usually do not require medical attention (report to your doctor or health care professional if they continue or are bothersome):  chills  cough  dizziness  fever  headaches  joint pain  muscle cramps  muscle pain  nausea, vomiting  pain, redness, or irritation at site where injected This list may not describe all possible side effects. Call your doctor for medical advice about side effects. You may report side effects to FDA at 1-800-FDA-1088. Where should I keep my medicine? Keep out of the reach of children. Store in a  refrigerator between 2 and 8 degrees C (36 and 46 degrees F). Do not freeze or shake. Throw away any unused portion if using a single-dose vial. Multi-dose vials can be kept in the refrigerator for up to 21 days after the initial dose. Throw away unused medicine. NOTE: This sheet is a summary. It may not cover all possible information. If you have questions about this medicine, talk to your doctor, pharmacist, or health care provider.  2020 Elsevier/Gold Standard (2016-10-13 08:35:19)

## 2019-07-01 ENCOUNTER — Ambulatory Visit (HOSPITAL_COMMUNITY)
Admission: RE | Admit: 2019-07-01 | Discharge: 2019-07-01 | Disposition: A | Discharge status: Home / Self Care | Payer: Medicare HMO | Source: Ambulatory Visit | Attending: Nephrology | Admitting: Nephrology

## 2019-07-01 ENCOUNTER — Other Ambulatory Visit: Payer: Self-pay

## 2019-07-01 VITALS — BP 149/65 | HR 83 | Temp 97.3°F | Resp 18

## 2019-07-01 DIAGNOSIS — N184 Chronic kidney disease, stage 4 (severe): Secondary | ICD-10-CM | POA: Diagnosis not present

## 2019-07-01 LAB — POCT HEMOGLOBIN-HEMACUE: Hemoglobin: 7.3 g/dL — ABNORMAL LOW (ref 12.0–15.0)

## 2019-07-01 MED ORDER — EPOETIN ALFA-EPBX 10000 UNIT/ML IJ SOLN
20000.0000 [IU] | INTRAMUSCULAR | Status: DC
  Administered 2019-07-01: 20000 [IU] via SUBCUTANEOUS

## 2019-07-01 MED ORDER — EPOETIN ALFA-EPBX 10000 UNIT/ML IJ SOLN
INTRAMUSCULAR | Status: AC
  Filled 2019-07-01: qty 2

## 2019-07-01 MED ORDER — SODIUM CHLORIDE 0.9 % IV SOLN
510.0000 mg | INTRAVENOUS | Status: DC
  Administered 2019-07-01: 510 mg via INTRAVENOUS
  Filled 2019-07-01: qty 510

## 2019-07-01 NOTE — Progress Notes (Signed)
Optometrist at [facility] of [facility] 7.3. Per [employer] we should give meds as orderded- patient was same at her appointment at office.

## 2019-07-08 ENCOUNTER — Other Ambulatory Visit: Payer: Self-pay

## 2019-07-08 ENCOUNTER — Ambulatory Visit (HOSPITAL_COMMUNITY)
Admission: RE | Admit: 2019-07-08 | Discharge: 2019-07-08 | Disposition: A | Discharge status: Home / Self Care | Payer: Medicare HMO | Source: Ambulatory Visit | Attending: Nephrology | Admitting: Nephrology

## 2019-07-08 DIAGNOSIS — N183 Chronic kidney disease, stage 3 unspecified: Secondary | ICD-10-CM | POA: Diagnosis not present

## 2019-07-08 DIAGNOSIS — D631 Anemia in chronic kidney disease: Secondary | ICD-10-CM | POA: Diagnosis not present

## 2019-07-08 MED ORDER — SODIUM CHLORIDE 0.9 % IV SOLN
510.0000 mg | INTRAVENOUS | Status: AC
  Administered 2019-07-08: 510 mg via INTRAVENOUS
  Filled 2019-07-08: qty 17

## 2019-07-09 DIAGNOSIS — N281 Cyst of kidney, acquired: Secondary | ICD-10-CM | POA: Diagnosis not present

## 2019-07-09 DIAGNOSIS — N185 Chronic kidney disease, stage 5: Secondary | ICD-10-CM | POA: Diagnosis not present

## 2019-07-09 DIAGNOSIS — Z01810 Encounter for preprocedural cardiovascular examination: Secondary | ICD-10-CM | POA: Diagnosis not present

## 2019-07-16 DIAGNOSIS — N184 Chronic kidney disease, stage 4 (severe): Secondary | ICD-10-CM | POA: Diagnosis not present

## 2019-07-16 DIAGNOSIS — E1122 Type 2 diabetes mellitus with diabetic chronic kidney disease: Secondary | ICD-10-CM | POA: Diagnosis not present

## 2019-07-16 DIAGNOSIS — E782 Mixed hyperlipidemia: Secondary | ICD-10-CM | POA: Diagnosis not present

## 2019-07-16 DIAGNOSIS — I10 Essential (primary) hypertension: Secondary | ICD-10-CM | POA: Diagnosis not present

## 2019-07-16 DIAGNOSIS — D649 Anemia, unspecified: Secondary | ICD-10-CM | POA: Diagnosis not present

## 2019-07-16 DIAGNOSIS — N185 Chronic kidney disease, stage 5: Secondary | ICD-10-CM | POA: Diagnosis not present

## 2019-07-16 DIAGNOSIS — E039 Hypothyroidism, unspecified: Secondary | ICD-10-CM | POA: Diagnosis not present

## 2019-07-17 ENCOUNTER — Ambulatory Visit (HOSPITAL_COMMUNITY)
Admission: RE | Admit: 2019-07-17 | Discharge: 2019-07-17 | Disposition: A | Discharge status: Home / Self Care | Payer: Medicare HMO | Source: Ambulatory Visit | Attending: Nephrology | Admitting: Nephrology

## 2019-07-17 ENCOUNTER — Other Ambulatory Visit: Payer: Self-pay

## 2019-07-17 VITALS — BP 171/104 | HR 105 | Temp 97.3°F | Resp 18

## 2019-07-17 DIAGNOSIS — N184 Chronic kidney disease, stage 4 (severe): Secondary | ICD-10-CM

## 2019-07-17 LAB — POCT HEMOGLOBIN-HEMACUE: Hemoglobin: 8 g/dL — ABNORMAL LOW (ref 12.0–15.0)

## 2019-07-17 MED ORDER — EPOETIN ALFA-EPBX 10000 UNIT/ML IJ SOLN
INTRAMUSCULAR | Status: AC
  Administered 2019-07-17: 20000 [IU]
  Filled 2019-07-17: qty 2

## 2019-07-17 MED ORDER — EPOETIN ALFA-EPBX 10000 UNIT/ML IJ SOLN: 20000.0000 [IU] | INTRAMUSCULAR | Status: DC

## 2019-07-22 ENCOUNTER — Encounter (HOSPITAL_COMMUNITY): Payer: Medicare HMO

## 2019-07-22 DIAGNOSIS — N185 Chronic kidney disease, stage 5: Secondary | ICD-10-CM | POA: Diagnosis not present

## 2019-07-30 ENCOUNTER — Encounter (HOSPITAL_COMMUNITY): Payer: Medicare HMO

## 2019-07-31 ENCOUNTER — Other Ambulatory Visit: Payer: Self-pay

## 2019-07-31 ENCOUNTER — Encounter (HOSPITAL_COMMUNITY)
Admission: RE | Admit: 2019-07-31 | Discharge: 2019-07-31 | Disposition: A | Discharge status: Home / Self Care | Payer: Medicare HMO | Source: Ambulatory Visit | Attending: Nephrology | Admitting: Nephrology

## 2019-07-31 VITALS — BP 123/87 | HR 89 | Temp 97.3°F | Resp 18

## 2019-07-31 DIAGNOSIS — N184 Chronic kidney disease, stage 4 (severe): Secondary | ICD-10-CM | POA: Insufficient documentation

## 2019-07-31 LAB — RENAL FUNCTION PANEL
Albumin: 2.4 g/dL — ABNORMAL LOW (ref 3.5–5.0)
Anion gap: 9 (ref 5–15)
BUN: 38 mg/dL — ABNORMAL HIGH (ref 8–23)
CO2: 19 mmol/L — ABNORMAL LOW (ref 22–32)
Calcium: 10 mg/dL (ref 8.9–10.3)
Chloride: 115 mmol/L — ABNORMAL HIGH (ref 98–111)
Creatinine, Ser: 7.43 mg/dL — ABNORMAL HIGH (ref 0.44–1.00)
GFR calc Af Amer: 6 mL/min — ABNORMAL LOW (ref >60)
GFR calc non Af Amer: 5 mL/min — ABNORMAL LOW (ref >60)
Glucose, Bld: 97 mg/dL (ref 70–99)
Phosphorus: 7.2 mg/dL — ABNORMAL HIGH (ref 2.5–4.6)
Potassium: 4.7 mmol/L (ref 3.5–5.1)
Sodium: 143 mmol/L (ref 135–145)

## 2019-07-31 LAB — IRON AND TIBC
Iron: 69 ug/dL (ref 28–170)
Saturation Ratios: 35 % — ABNORMAL HIGH (ref 10.4–31.8)
TIBC: 199 ug/dL — ABNORMAL LOW (ref 250–450)
UIBC: 130 ug/dL

## 2019-07-31 LAB — POCT HEMOGLOBIN-HEMACUE: Hemoglobin: 9 g/dL — ABNORMAL LOW (ref 12.0–15.0)

## 2019-07-31 LAB — FERRITIN: Ferritin: 402 ng/mL — ABNORMAL HIGH (ref 11–307)

## 2019-07-31 LAB — HEPATITIS B SURFACE ANTIGEN: Hepatitis B Surface Ag: NONREACTIVE

## 2019-07-31 MED ORDER — EPOETIN ALFA-EPBX 10000 UNIT/ML IJ SOLN
INTRAMUSCULAR | Status: AC
  Filled 2019-07-31: qty 2

## 2019-07-31 MED ORDER — EPOETIN ALFA-EPBX 10000 UNIT/ML IJ SOLN
20000.0000 [IU] | INTRAMUSCULAR | Status: DC
  Administered 2019-07-31: 20000 [IU] via SUBCUTANEOUS

## 2019-08-01 LAB — PTH, INTACT AND CALCIUM
Calcium, Total (PTH): 9.7 mg/dL (ref 8.7–10.3)
PTH: 320 pg/mL — ABNORMAL HIGH (ref 15–65)

## 2019-08-05 DIAGNOSIS — Z01810 Encounter for preprocedural cardiovascular examination: Secondary | ICD-10-CM | POA: Diagnosis not present

## 2019-08-05 DIAGNOSIS — E1110 Type 2 diabetes mellitus with ketoacidosis without coma: Secondary | ICD-10-CM | POA: Diagnosis not present

## 2019-08-05 DIAGNOSIS — N186 End stage renal disease: Secondary | ICD-10-CM | POA: Diagnosis not present

## 2019-08-05 DIAGNOSIS — I120 Hypertensive chronic kidney disease with stage 5 chronic kidney disease or end stage renal disease: Secondary | ICD-10-CM | POA: Diagnosis not present

## 2019-08-05 DIAGNOSIS — Z7984 Long term (current) use of oral hypoglycemic drugs: Secondary | ICD-10-CM | POA: Diagnosis not present

## 2019-08-05 DIAGNOSIS — E1122 Type 2 diabetes mellitus with diabetic chronic kidney disease: Secondary | ICD-10-CM | POA: Diagnosis not present

## 2019-08-08 DIAGNOSIS — E039 Hypothyroidism, unspecified: Secondary | ICD-10-CM | POA: Diagnosis not present

## 2019-08-08 DIAGNOSIS — D649 Anemia, unspecified: Secondary | ICD-10-CM | POA: Diagnosis not present

## 2019-08-08 DIAGNOSIS — E1122 Type 2 diabetes mellitus with diabetic chronic kidney disease: Secondary | ICD-10-CM | POA: Diagnosis not present

## 2019-08-08 DIAGNOSIS — E782 Mixed hyperlipidemia: Secondary | ICD-10-CM | POA: Diagnosis not present

## 2019-08-08 DIAGNOSIS — N184 Chronic kidney disease, stage 4 (severe): Secondary | ICD-10-CM | POA: Diagnosis not present

## 2019-08-08 DIAGNOSIS — I10 Essential (primary) hypertension: Secondary | ICD-10-CM | POA: Diagnosis not present

## 2019-08-08 DIAGNOSIS — N185 Chronic kidney disease, stage 5: Secondary | ICD-10-CM | POA: Diagnosis not present

## 2019-08-13 DIAGNOSIS — N185 Chronic kidney disease, stage 5: Secondary | ICD-10-CM | POA: Diagnosis not present

## 2019-08-13 DIAGNOSIS — E1122 Type 2 diabetes mellitus with diabetic chronic kidney disease: Secondary | ICD-10-CM | POA: Diagnosis not present

## 2019-08-13 DIAGNOSIS — I120 Hypertensive chronic kidney disease with stage 5 chronic kidney disease or end stage renal disease: Secondary | ICD-10-CM | POA: Diagnosis not present

## 2019-08-14 ENCOUNTER — Ambulatory Visit (HOSPITAL_COMMUNITY)
Admission: RE | Admit: 2019-08-14 | Discharge: 2019-08-14 | Disposition: A | Discharge status: Home / Self Care | Payer: Medicare HMO | Source: Ambulatory Visit | Attending: Nephrology | Admitting: Nephrology

## 2019-08-14 ENCOUNTER — Other Ambulatory Visit: Payer: Self-pay

## 2019-08-14 VITALS — BP 155/80 | HR 88 | Temp 97.0°F | Resp 20

## 2019-08-14 DIAGNOSIS — N184 Chronic kidney disease, stage 4 (severe): Secondary | ICD-10-CM | POA: Diagnosis not present

## 2019-08-14 LAB — POCT HEMOGLOBIN-HEMACUE: Hemoglobin: 9.1 g/dL — ABNORMAL LOW (ref 12.0–15.0)

## 2019-08-14 MED ORDER — EPOETIN ALFA-EPBX 10000 UNIT/ML IJ SOLN: 20000.0000 [IU] | INTRAMUSCULAR | Status: DC

## 2019-08-14 MED ORDER — EPOETIN ALFA-EPBX 10000 UNIT/ML IJ SOLN
INTRAMUSCULAR | Status: AC
  Administered 2019-08-14: 20000 [IU] via SUBCUTANEOUS
  Filled 2019-08-14: qty 2

## 2019-08-19 DIAGNOSIS — N185 Chronic kidney disease, stage 5: Secondary | ICD-10-CM | POA: Diagnosis not present

## 2019-08-28 ENCOUNTER — Encounter (HOSPITAL_COMMUNITY): Payer: Medicare HMO

## 2019-09-08 DIAGNOSIS — C641 Malignant neoplasm of right kidney, except renal pelvis: Secondary | ICD-10-CM | POA: Insufficient documentation

## 2019-09-08 DIAGNOSIS — D4101 Neoplasm of uncertain behavior of right kidney: Secondary | ICD-10-CM | POA: Insufficient documentation

## 2019-09-11 DIAGNOSIS — Z7982 Long term (current) use of aspirin: Secondary | ICD-10-CM | POA: Diagnosis not present

## 2019-09-11 DIAGNOSIS — E782 Mixed hyperlipidemia: Secondary | ICD-10-CM | POA: Diagnosis not present

## 2019-09-11 DIAGNOSIS — D4101 Neoplasm of uncertain behavior of right kidney: Secondary | ICD-10-CM | POA: Diagnosis not present

## 2019-09-11 DIAGNOSIS — I10 Essential (primary) hypertension: Secondary | ICD-10-CM | POA: Diagnosis not present

## 2019-09-11 DIAGNOSIS — Z7984 Long term (current) use of oral hypoglycemic drugs: Secondary | ICD-10-CM | POA: Diagnosis not present

## 2019-09-11 DIAGNOSIS — Z79899 Other long term (current) drug therapy: Secondary | ICD-10-CM | POA: Diagnosis not present

## 2019-09-11 DIAGNOSIS — N185 Chronic kidney disease, stage 5: Secondary | ICD-10-CM | POA: Diagnosis not present

## 2019-09-11 DIAGNOSIS — Z01818 Encounter for other preprocedural examination: Secondary | ICD-10-CM | POA: Diagnosis not present

## 2019-09-11 DIAGNOSIS — E039 Hypothyroidism, unspecified: Secondary | ICD-10-CM | POA: Diagnosis not present

## 2019-09-11 DIAGNOSIS — I120 Hypertensive chronic kidney disease with stage 5 chronic kidney disease or end stage renal disease: Secondary | ICD-10-CM | POA: Diagnosis not present

## 2019-09-11 DIAGNOSIS — N184 Chronic kidney disease, stage 4 (severe): Secondary | ICD-10-CM | POA: Diagnosis not present

## 2019-09-11 DIAGNOSIS — E1122 Type 2 diabetes mellitus with diabetic chronic kidney disease: Secondary | ICD-10-CM | POA: Diagnosis not present

## 2019-09-11 DIAGNOSIS — D649 Anemia, unspecified: Secondary | ICD-10-CM | POA: Diagnosis not present

## 2019-09-23 DIAGNOSIS — N185 Chronic kidney disease, stage 5: Secondary | ICD-10-CM | POA: Diagnosis not present

## 2019-09-27 ENCOUNTER — Inpatient Hospital Stay (HOSPITAL_COMMUNITY)
Admission: EM | Admit: 2019-09-27 | Discharge: 2019-09-29 | DRG: 638 | Disposition: A | Discharge status: Home / Self Care | Payer: Medicare HMO | Attending: Family Medicine | Admitting: Family Medicine

## 2019-09-27 ENCOUNTER — Other Ambulatory Visit: Payer: Self-pay

## 2019-09-27 ENCOUNTER — Encounter (HOSPITAL_COMMUNITY): Payer: Self-pay | Admitting: Emergency Medicine

## 2019-09-27 DIAGNOSIS — Z7984 Long term (current) use of oral hypoglycemic drugs: Secondary | ICD-10-CM | POA: Diagnosis not present

## 2019-09-27 DIAGNOSIS — I120 Hypertensive chronic kidney disease with stage 5 chronic kidney disease or end stage renal disease: Secondary | ICD-10-CM | POA: Diagnosis not present

## 2019-09-27 DIAGNOSIS — N185 Chronic kidney disease, stage 5: Secondary | ICD-10-CM | POA: Diagnosis not present

## 2019-09-27 DIAGNOSIS — Z91128 Patient's intentional underdosing of medication regimen for other reason: Secondary | ICD-10-CM | POA: Diagnosis not present

## 2019-09-27 DIAGNOSIS — E11649 Type 2 diabetes mellitus with hypoglycemia without coma: Principal | ICD-10-CM | POA: Diagnosis present

## 2019-09-27 DIAGNOSIS — Z96653 Presence of artificial knee joint, bilateral: Secondary | ICD-10-CM | POA: Diagnosis present

## 2019-09-27 DIAGNOSIS — Z79899 Other long term (current) drug therapy: Secondary | ICD-10-CM | POA: Diagnosis not present

## 2019-09-27 DIAGNOSIS — E162 Hypoglycemia, unspecified: Secondary | ICD-10-CM

## 2019-09-27 DIAGNOSIS — N179 Acute kidney failure, unspecified: Secondary | ICD-10-CM | POA: Diagnosis not present

## 2019-09-27 DIAGNOSIS — T501X6A Underdosing of loop [high-ceiling] diuretics, initial encounter: Secondary | ICD-10-CM | POA: Diagnosis present

## 2019-09-27 DIAGNOSIS — M79651 Pain in right thigh: Secondary | ICD-10-CM | POA: Diagnosis not present

## 2019-09-27 DIAGNOSIS — E1122 Type 2 diabetes mellitus with diabetic chronic kidney disease: Secondary | ICD-10-CM | POA: Diagnosis present

## 2019-09-27 DIAGNOSIS — E161 Other hypoglycemia: Secondary | ICD-10-CM | POA: Diagnosis not present

## 2019-09-27 DIAGNOSIS — Z885 Allergy status to narcotic agent status: Secondary | ICD-10-CM | POA: Diagnosis not present

## 2019-09-27 DIAGNOSIS — Z6841 Body Mass Index (BMI) 40.0 and over, adult: Secondary | ICD-10-CM | POA: Diagnosis not present

## 2019-09-27 DIAGNOSIS — D631 Anemia in chronic kidney disease: Secondary | ICD-10-CM | POA: Diagnosis present

## 2019-09-27 DIAGNOSIS — Z20822 Contact with and (suspected) exposure to covid-19: Secondary | ICD-10-CM | POA: Diagnosis present

## 2019-09-27 DIAGNOSIS — M109 Gout, unspecified: Secondary | ICD-10-CM | POA: Diagnosis present

## 2019-09-27 DIAGNOSIS — I10 Essential (primary) hypertension: Secondary | ICD-10-CM | POA: Diagnosis not present

## 2019-09-27 DIAGNOSIS — R001 Bradycardia, unspecified: Secondary | ICD-10-CM | POA: Diagnosis not present

## 2019-09-27 DIAGNOSIS — Z7982 Long term (current) use of aspirin: Secondary | ICD-10-CM

## 2019-09-27 DIAGNOSIS — E785 Hyperlipidemia, unspecified: Secondary | ICD-10-CM | POA: Diagnosis not present

## 2019-09-27 DIAGNOSIS — Z03818 Encounter for observation for suspected exposure to other biological agents ruled out: Secondary | ICD-10-CM | POA: Diagnosis not present

## 2019-09-27 DIAGNOSIS — N281 Cyst of kidney, acquired: Secondary | ICD-10-CM | POA: Diagnosis present

## 2019-09-27 DIAGNOSIS — Z8249 Family history of ischemic heart disease and other diseases of the circulatory system: Secondary | ICD-10-CM

## 2019-09-27 LAB — I-STAT CHEM 8, ED
BUN: 45 mg/dL — ABNORMAL HIGH (ref 8–23)
Calcium, Ion: 1.37 mmol/L (ref 1.15–1.40)
Chloride: 118 mmol/L — ABNORMAL HIGH (ref 98–111)
Creatinine, Ser: 9.3 mg/dL — ABNORMAL HIGH (ref 0.44–1.00)
Glucose, Bld: 60 mg/dL — ABNORMAL LOW (ref 70–99)
HCT: 25 % — ABNORMAL LOW (ref 36.0–46.0)
Hemoglobin: 8.5 g/dL — ABNORMAL LOW (ref 12.0–15.0)
Potassium: 4.5 mmol/L (ref 3.5–5.1)
Sodium: 145 mmol/L (ref 135–145)
TCO2: 18 mmol/L — ABNORMAL LOW (ref 22–32)

## 2019-09-27 LAB — CBC
HCT: 26.5 % — ABNORMAL LOW (ref 36.0–46.0)
Hemoglobin: 7.5 g/dL — ABNORMAL LOW (ref 12.0–15.0)
MCH: 27.3 pg (ref 26.0–34.0)
MCHC: 28.3 g/dL — ABNORMAL LOW (ref 30.0–36.0)
MCV: 96.4 fL (ref 80.0–100.0)
Platelets: 149 10*3/uL — ABNORMAL LOW (ref 150–400)
RBC: 2.75 MIL/uL — ABNORMAL LOW (ref 3.87–5.11)
RDW: 16.3 % — ABNORMAL HIGH (ref 11.5–15.5)
WBC: 5.3 10*3/uL (ref 4.0–10.5)
nRBC: 0.4 % — ABNORMAL HIGH (ref 0.0–0.2)

## 2019-09-27 LAB — CBG MONITORING, ED
Glucose-Capillary: 64 mg/dL — ABNORMAL LOW (ref 70–99)
Glucose-Capillary: 54 mg/dL — ABNORMAL LOW (ref 70–99)
Glucose-Capillary: 48 mg/dL — ABNORMAL LOW (ref 70–99)
Glucose-Capillary: 58 mg/dL — ABNORMAL LOW (ref 70–99)
Glucose-Capillary: 47 mg/dL — ABNORMAL LOW (ref 70–99)

## 2019-09-27 MED ORDER — ASPIRIN 81 MG PO CHEW
81.0000 mg | CHEWABLE_TABLET | Freq: Every day | ORAL | Status: DC
  Administered 2019-09-27 – 2019-09-28 (×2): 81 mg via ORAL
  Filled 2019-09-27 (×2): qty 1

## 2019-09-27 MED ORDER — GABAPENTIN 100 MG PO CAPS
200.0000 mg | ORAL_CAPSULE | Freq: Two times a day (BID) | ORAL | Status: DC | PRN
  Administered 2019-09-28: 200 mg via ORAL
  Filled 2019-09-27: qty 2

## 2019-09-27 MED ORDER — CARVEDILOL 25 MG PO TABS
25.0000 mg | ORAL_TABLET | Freq: Two times a day (BID) | ORAL | Status: DC
  Administered 2019-09-28 – 2019-09-29 (×3): 25 mg via ORAL
  Filled 2019-09-27 (×3): qty 1

## 2019-09-27 MED ORDER — DEXTROSE 5 % IV SOLN: INTRAVENOUS | Status: DC

## 2019-09-27 MED ORDER — ACETAMINOPHEN 325 MG PO TABS
650.0000 mg | ORAL_TABLET | Freq: Four times a day (QID) | ORAL | Status: DC | PRN
  Administered 2019-09-28: 650 mg via ORAL
  Filled 2019-09-27: qty 2

## 2019-09-27 MED ORDER — ACETAMINOPHEN 650 MG RE SUPP: 650.0000 mg | Freq: Four times a day (QID) | RECTAL | Status: DC | PRN

## 2019-09-27 MED ORDER — AMLODIPINE BESYLATE 10 MG PO TABS
10.0000 mg | ORAL_TABLET | Freq: Every day | ORAL | Status: DC
  Administered 2019-09-27 – 2019-09-28 (×2): 10 mg via ORAL
  Filled 2019-09-27: qty 2
  Filled 2019-09-27: qty 1

## 2019-09-27 MED ORDER — HYDROXYZINE HCL 25 MG PO TABS: 25.0000 mg | ORAL_TABLET | Freq: Every evening | ORAL | Status: DC | PRN

## 2019-09-27 MED ORDER — HEPARIN SODIUM (PORCINE) 5000 UNIT/ML IJ SOLN
5000.0000 [IU] | Freq: Three times a day (TID) | INTRAMUSCULAR | Status: DC
  Administered 2019-09-27 – 2019-09-29 (×4): 5000 [IU] via SUBCUTANEOUS
  Filled 2019-09-27 (×4): qty 1

## 2019-09-27 MED ORDER — ONDANSETRON HCL 4 MG/2ML IJ SOLN: 4.0000 mg | Freq: Four times a day (QID) | INTRAMUSCULAR | Status: DC | PRN

## 2019-09-27 MED ORDER — DEXTROSE 10 % IV SOLN: INTRAVENOUS | Status: DC

## 2019-09-27 MED ORDER — SODIUM CHLORIDE 0.9% FLUSH
3.0000 mL | Freq: Two times a day (BID) | INTRAVENOUS | Status: DC
  Administered 2019-09-28 (×2): 3 mL via INTRAVENOUS

## 2019-09-27 MED ORDER — ONDANSETRON HCL 4 MG PO TABS: 4.0000 mg | ORAL_TABLET | Freq: Four times a day (QID) | ORAL | Status: DC | PRN

## 2019-09-27 MED ORDER — ATORVASTATIN CALCIUM 40 MG PO TABS
40.0000 mg | ORAL_TABLET | Freq: Every day | ORAL | Status: DC
  Administered 2019-09-28 – 2019-09-29 (×2): 40 mg via ORAL
  Filled 2019-09-27 (×2): qty 1

## 2019-09-27 MED ORDER — POLYETHYLENE GLYCOL 3350 17 G PO PACK: 17.0000 g | PACK | Freq: Every day | ORAL | Status: DC | PRN

## 2019-09-27 MED ORDER — FUROSEMIDE 80 MG PO TABS
80.0000 mg | ORAL_TABLET | Freq: Two times a day (BID) | ORAL | Status: DC
  Administered 2019-09-28 – 2019-09-29 (×3): 80 mg via ORAL
  Filled 2019-09-27 (×3): qty 1

## 2019-09-27 NOTE — Discharge Instructions (Addendum)
Not take Victoza or glipizide until you are seen by your doctor and have blood sugars rechecked on Monday Please take your blood sugar every 2 hours through the night and have somebody with you.  If you become confused please have them take your blood sugar immediately and give you something to eat.  Please continue to take small frequent amounts of food that have sugar in them.

## 2019-09-27 NOTE — ED Provider Notes (Addendum)
Kristen English   CSN: [id number] Arrival date & time: 09/27/19  1850     History Chief Complaint  Patient presents with  . Hypoglycemia    Kristen English is a 64 y.o. female.  HPI     64 year old female with history of diabetes, renal failure, who presents today complaining of hypoglycemia.  She states that she has been having problems with low blood sugar.  She began getting dizzy today and fell trying to sit up when EMS arrived her CBG was 34.  She was given [place] jelly and coke and her sugar increased to 54.  When EMS arrived they gave her D10.  On arrival to the ED her CBG is 80 and she is alert and oriented taking p.o. without difficulty.  She reports no recent changes in her medications.  She continues to take Victoza and Glucotrol daily.  She has been having some ongoing problems with hypoglycemia.  She is scheduled to have a peritoneal dialysis catheter and kidney surgery next week.  She states that the peritoneal dialysis catheter surgery has been postponed due to the kidney surgery.  She states that she has been told she is close to needing to be on dialysis.  She has not started dialysis at this time.  She has had some weight loss and does not eat as much as she used to but has not had any ongoing nausea or vomiting recently.  Past Medical History:  Diagnosis Date  . Arthritis   . Chronic kidney disease    protein in urine  . Diabetes mellitus without complication ([place])   . Gout   . Hypertension     Patient Active Problem List   Diagnosis Date Noted  . Chronic renal insufficiency, stage 4 (severe) ([place]) 09/05/2018  . Plasma cell dyscrasia 04/27/2014  . 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 COLONOSCOPY WITH PROPOFOL N/A 05/11/2014   Procedure: COLONOSCOPY WITH PROPOFOL;  Surgeon: Garlan Fair, MD;  Location: [facility];   Service: Endoscopy;  Laterality: N/A;  . FOOT SURGERY Left 2007  . JOINT REPLACEMENT  2009   bilateral knees  . TOTAL KNEE ARTHROPLASTY     bilateral     OB History   No obstetric history on file.     History reviewed. No pertinent family history.  Social History   Tobacco Use  . Smoking status: Never Smoker  . Smokeless tobacco: Never Used  Substance Use Topics  . Alcohol use: No  . Drug use: No    Home Medications Prior to Admission medications   Medication Sig Start Date End Date Taking? Authorizing Provider  amLODipine (NORVASC) 10 MG tablet Take 10 mg by mouth at bedtime.     [provider]  aspirin 81 MG tablet Take 81 mg by mouth at bedtime.     [provider]  atorvastatin (LIPITOR) 40 MG tablet  11/01/15   [provider]  [place] 5-2.5-18.5 injection  09/30/15   [provider]  carvedilol (COREG) 25 MG tablet Take 25 mg by mouth 2 (two) times daily with a meal.  02/24/13   [provider]  cholecalciferol (VITAMIN D) 1000 UNITS tablet Take 2,000 Units by mouth every morning.     [provider]  colchicine 0.6 MG tablet Take 0.6 mg by mouth every 12 (twelve) hours as needed (gout.).     [provider]  ferrous sulfate (FERROUSUL) 325 (65 FE) MG tablet Take 325 mg by mouth.    [provider]  ferrous sulfate 325 (65 FE) MG tablet Take 325 mg by mouth daily with breakfast.    [provider]  furosemide (LASIX) 40 MG tablet Take 40 mg by mouth 2 (two) times daily as needed for fluid.    [provider]  glipiZIDE (GLUCOTROL) 5 MG tablet Take 5 mg by mouth 3 (three) times daily with meals.    [provider]  hydrOXYzine (ATARAX/VISTARIL) 25 MG tablet Take 25 mg by mouth at bedtime as needed for itching.     [provider]  Liraglutide (VICTOZA) 18 MG/3ML SOPN Inject 1.8 mg into the skin daily at 12 noon.     [provider]  lovastatin (MEVACOR) 10  MG tablet Take 10 mg by mouth at bedtime.  03/19/13   [provider]  NOVOFINE 32G X 6 MM [place]  08/07/14   [provider]    Allergies    Morphine and related  Review of Systems   Review of Systems  All other systems reviewed and are negative.   Physical Exam Updated Vital Signs BP (!) 161/80   Pulse 83   Temp (!) 97.4 F (36.3 C) (Oral)   Resp (!) 21   SpO2 99%   Physical Exam Vitals and nursing English reviewed.  Constitutional:      Appearance: Normal appearance. She is well-developed. She is obese.  HENT:     Head: Normocephalic and atraumatic.     Right Ear: External ear normal.     Left Ear: External ear normal.     Nose: Nose normal.  Eyes:     Conjunctiva/sclera: Conjunctivae normal.     Pupils: Pupils are equal, round, and reactive to light.  Cardiovascular:     Rate and Rhythm: Normal rate and regular rhythm.     Heart sounds: Normal heart sounds.  Pulmonary:     Effort: Pulmonary effort is normal.     Breath sounds: Normal breath sounds.  Abdominal:     General: Bowel sounds are normal.     Palpations: Abdomen is soft.  Musculoskeletal:        General: Normal range of motion.     Cervical back: Normal range of motion and neck supple.  Skin:    General: Skin is warm and dry.  Neurological:     Mental Status: She is alert and oriented to person, place, and time.     Deep Tendon Reflexes: Reflexes are normal and symmetric.  Psychiatric:        Behavior: Behavior normal.        Thought Content: Thought content normal.        Judgment: Judgment normal.     ED Results / Procedures / Treatments   Labs (all labs ordered are listed, but only abnormal results are displayed) Labs Reviewed  CBG MONITORING, ED - Abnormal; Notable for the following components:      Result Value   Glucose-Capillary 64 (*)    All other components within normal limits  I-STAT CHEM 8, ED - Abnormal; Notable for the following components:   Chloride 118 (*)     BUN 45 (*)    Creatinine, Ser 9.30 (*)    Glucose, Bld 60 (*)    TCO2 18 (*)    Hemoglobin 8.5 (*)    HCT 25.0 (*)    All other components within  normal limits    EKG None  Radiology No results found.  Procedures Procedures (including critical care time)  Medications Ordered in ED Medications - No data to display  ED Course  I have reviewed the triage vital signs and the nursing notes.  Pertinent labs & imaging results that were available during my care of the patient were reviewed by me and considered in my medical decision making (see chart for details).    MDM Rules/Calculators/A&P                           Tolerating p.o. well here.  I-STAT checked and creatinine is significantly elevated with normal potassium.  Plan to hold Victoza and glipizide until she is able to be seen by her primary care doctor and have blood sugars checked on Monday.  I suspect she will need to have these medications adjusted given her change in weight and her renal failure.  I discussed frequent checks of blood sugars and mental status and need to be accompanied.  9:35 PM Patient continues to have hypoglycemia 50s to 40s despite po intake.  Plan iv d5 and will need to be admitted. Discussed with Dr. Daryll Drown who will see for admission Patient final Clinical Impression(s) / ED Diagnoses Final diagnoses:  Hypoglycemia    Rx / DC Orders ED Discharge Orders    None       Pattricia Boss, MD 09/27/19 2044    Pattricia Boss, MD 09/27/19 2200

## 2019-09-27 NOTE — ED Notes (Signed)
Pt given soda per Dr.Ray.

## 2019-09-27 NOTE — ED Notes (Signed)
Pt ambulated to the br with a steady gait. 

## 2019-09-27 NOTE — ED Notes (Signed)
Pt given orange juice and graham crackers to elevate sugar.

## 2019-09-27 NOTE — H&P (Addendum)
History and Physical    AMARE KONTOS MWU:[id number] DOB: Sep 24, 1955 DOA: 09/27/2019  PCP: Wenda Low, MD  Patient coming from: Home  Chief Complaint: Low blood sugar  HPI: Kristen English is a 64 y.o. female with medical history significant of HTN, DM2, CKD stage 5, gout, arthritis who presents for hypoglycemia.  Kristen English reports that she has been having this problem for 2-3 weeks. About 3 weeks ago, she had a episode of low blood sugar and passing out.  Her friends and EMS helped her.  Her BS was 50 and after eating this improved.  She is on Victoza and glipizide for her DM2.  She has been taking these regularly, except she stopped the victoza 1 week ago.  She notes persistent fatigue.  This morning, she woke up and her mouth was numb. She ate and felt better.  However, after a nap, she awoke at 4pm to dizziness, SOB, decreased vision.  She attempted to get up and "fell out."  EMS was called and her BS was 30 and she was brought to the ED.  ED attempted to get her home, but she continued to drop her blood sugar despite eating a meal and orange juice sugar was 40.  Further associated symptoms include SOB and dizziness intermittently.   She has a complicated renal history.  She is currently being worked up for right nephrectomy due to renal cyst that has an unknown malignancy potential.  She is further supposed to have a peritoneal dialysis catheter placed.  However, this has been postponed at this time.  She is supposed to be on lasix 80mg  BID for abdominal and lower leg swelling.  Recently, she has only been taking every other day because she was nervous about the side effects.  Today, she reports LE swelling and abdominal swelling.  She has been losing weight due to the renal disease.    ED Course: In the ED, she was attempted to be sent home, but blood sugar remained low.  Renal function is worse at 9.3 with a GFR that was not calculated.  Her H/H are stable at 8.5/25.  She has an elevated  BUN at 45.  K is 4.5.    Review of Systems: As per HPI otherwise all other systems reviewed and are negative.   Past Medical History:  Diagnosis Date  . Arthritis   . Chronic kidney disease    protein in urine  . Diabetes mellitus without complication ([place])   . Gout   . Hypertension     Past Surgical History:  Procedure Laterality Date  . COLONOSCOPY WITH PROPOFOL N/A 05/11/2014   Procedure: COLONOSCOPY WITH PROPOFOL;  Surgeon: Garlan Fair, MD;  Location: [facility];  Service: Endoscopy;  Laterality: N/A;  . FOOT SURGERY Left 2007  . JOINT REPLACEMENT  2009   bilateral knees  . TOTAL KNEE ARTHROPLASTY     bilateral    Social History  reports that she has never smoked. She has never used smokeless tobacco. She reports that she does not drink alcohol and does not use drugs.  Allergies  Allergen Reactions  . Morphine And Related Itching    Family History  Problem Relation Age of Onset  . Heart attack Mother   . Heart attack Father   Daughter is healthy  Prior to Admission medications   Medication Sig Start Date End Date Taking? Authorizing Provider  amLODipine (NORVASC) 10 MG tablet Take 10 mg by mouth at bedtime.  [provider]  aspirin 81 MG tablet Take 81 mg by mouth at bedtime.     [provider]  atorvastatin (LIPITOR) 40 MG tablet  11/01/15   [provider]  [place] 5-2.5-18.5 injection  09/30/15   [provider]  carvedilol (COREG) 25 MG tablet Take 25 mg by mouth 2 (two) times daily with a meal.  02/24/13   [provider]  cholecalciferol (VITAMIN D) 1000 UNITS tablet Take 2,000 Units by mouth every morning.     [provider]  colchicine 0.6 MG tablet Take 0.6 mg by mouth every 12 (twelve) hours as needed (gout.).     [provider]  ferrous sulfate (FERROUSUL) 325 (65 FE) MG tablet Take 325 mg by mouth.    [provider]  ferrous sulfate 325 (65 FE) MG tablet Take 325 mg by  mouth daily with breakfast.    [provider]  furosemide (LASIX) 40 MG tablet Supposed to be 80mg  BID Take 40 mg by mouth 2 (two) times daily as needed for fluid.    [provider]  hydrOXYzine (ATARAX/VISTARIL) 25 MG tablet Take 25 mg by mouth at bedtime as needed for itching.     [provider]  lovastatin (MEVACOR) 10 MG tablet Take 10 mg by mouth at bedtime.  03/19/13   [provider]  NOVOFINE 32G X 6 MM MISC  08/07/14   [provider]  glipiZIDE (GLUCOTROL) 5 MG tablet Take 5 mg by mouth 3 (three) times daily with meals.  09/27/19  [provider]  Liraglutide (VICTOZA) 18 MG/3ML SOPN Inject 1.8 mg into the skin daily at 12 noon.   09/27/19  [provider]    Physical Exam: Vitals:   09/27/19 2100 09/27/19 2115 09/27/19 2130 09/27/19 2145  BP: (!) 154/66 (!) 173/84 (!) 180/81 (!) 173/66  Pulse: 83 79 88 88  Resp: 19 (!) 28 (!) 21 (!) 24  Temp:      TempSrc:      SpO2: 98% 100% 99% 99%    Constitutional: NAD, calm, comfortable Eyes: lids and conjunctivae normal ENMT: Mucous membranes are moist.   Neck: normal, supple Respiratory: CTAB, no wheezing or rales Cardiovascular: RR, NR, no murmur.  Pulses are intact in bilateral DP and radial arteries.  She has 1+ pitting edema of the legs to knees.   Abdomen: Obese, +BS, NT, ND Musculoskeletal: no clubbing / cyanosis. No contractures Skin: She has chronic skin changes on the feet related to edema.  No other rash or wound on exposed skin Neurologic: CN 2-12 grossly intact. Sensation intact to light touch, no gross motor issues.  Psychiatric: Normal judgment and insight. Alert and oriented x 3. Normal mood.     Labs on Admission: I have personally reviewed following labs and imaging studies  CBC: Recent Labs  Lab 09/27/19 2016  HGB 8.5*  HCT 25.0*    Basic Metabolic Panel: Recent Labs  Lab 09/27/19 2016  NA 145  K 4.5  CL 118*  GLUCOSE 60*  BUN 45*    CREATININE 9.30*    GFR: CrCl cannot be calculated (Unknown ideal weight.).  Liver Function Tests: No results for input(s): AST, ALT, ALKPHOS, BILITOT, PROT, ALBUMIN in the last 168 hours.    Assessment/Plan  Hypoglycemia associated with type 2 diabetes mellitus  Diabetes type 2 - Likely related to GLP-1 and sulfonylurea which can both cause hypoglycemia in renal failure.  Sulfonylurea half life is 2-5 hours; 6-12 for  long acting.  If she took this morning, she should be at 3-4 half lifes this evening or tomorrow morning.  A bit longer if the long acting formulation was taken.  - medications held, discussed possibility of starting insulin as an alternative - D5 at 100cc/hr overnight - Monitor CBG q 4 hours - Telemetry - Carb Mod/Renal diet - Once sugars have normalized, would start SSI and likely long acting + meal coverage  AKI on CKD stage 5 - GFR around 5 at baseline, mildly worse - Strict I/O - D5 at 100cc/hr - Given swelling, will also restart her lasix dose (she is only taking every other day) - Close follow up with renal physician, consider nephrology consult in the AM.   - K is normal and patient is hemodynamically stable, no need for urgent dialysis  Normocytic anemia, chronic - Monitor.  Repeat CBC with MCV in the AM  Right Leg Pain - From hip to knee - likely MSK in nature - Trial of gabapentin - PT evaluation in the AM  DVT prophylaxis: Heparin SQ  Code Status:   Full  Disposition Plan:   Patient is from:  Home  Anticipated DC to:  Home  Anticipated DC date:  09/29/19  Anticipated DC barriers: Wash out of sulfonylurea  Consults called:  Consider Nephrology if renal function does not improve Admission status:  INP, telemetry   Severity of Illness: The appropriate patient status for this patient is INPATIENT. Inpatient status is judged to be reasonable and necessary in order to provide the required intensity of service to ensure the patient's safety. The  patient's presenting symptoms, physical exam findings, and initial radiographic and laboratory data in the context of their chronic comorbidities is felt to place them at high risk for further clinical deterioration. Furthermore, it is not anticipated that the patient will be medically stable for discharge from the hospital within 2 midnights of admission. The following factors support the patient status of inpatient.   " The patient's presenting symptoms include hypoglycemia. " The worrisome physical exam findings include passing out, inability to maintain blood sugar. " The initial radiographic and laboratory data are worrisome because of elevated creatinine, low blood sugar. " The chronic co-morbidities include renal failure, DM2.   * I certify that at the point of admission it is my clinical judgment that the patient will require inpatient hospital care spanning beyond 2 midnights from the point of admission due to high intensity of service, high risk for further deterioration and high frequency of surveillance required.Gilles Chiquito MD [facility]  How to contact the [facility] Attending or Consulting provider [place] or covering provider during after hours [place], for this patient?   1. Check the care team in [facility] and look for a) attending/consulting [facility] provider listed and b) the [facility] team listed 2. Log into [email or web address] and use [facility]'s universal password to access. If you do not have the password, please contact the hospital operator. 3. Locate the [facility] provider you are looking for under [facility] and page to a number that you can be directly reached. 4. If you still have difficulty reaching the provider, please page the [facility] (Director on Call) for the Hospitalists listed on amion for assistance.  09/27/2019, 10:39 PM

## 2019-09-27 NOTE — ED Triage Notes (Signed)
Pt arrived to ED via [facility] d/t hypoglycemia. Pt was reported to have been having no appetite but remaining to take all of her diabetes medicine. She began getting dizzy and fell while trying to sit, when EMS arrived to her home, her CBG was 34. FD gave her a PBJ sandwich and a coke & her CBG went up to 54 by the time EMS arrived on scene, they gave her 5G of D10. Upon arrival to ED pt's CBG is 80, she is A/Ox4, verbal-able to make needs known.

## 2019-09-28 DIAGNOSIS — D631 Anemia in chronic kidney disease: Secondary | ICD-10-CM

## 2019-09-28 DIAGNOSIS — E11649 Type 2 diabetes mellitus with hypoglycemia without coma: Principal | ICD-10-CM

## 2019-09-28 DIAGNOSIS — N179 Acute kidney failure, unspecified: Secondary | ICD-10-CM

## 2019-09-28 DIAGNOSIS — N185 Chronic kidney disease, stage 5: Secondary | ICD-10-CM

## 2019-09-28 LAB — GLUCOSE, CAPILLARY
Glucose-Capillary: 26 mg/dL — CL (ref 70–99)
Glucose-Capillary: 32 mg/dL — CL (ref 70–99)
Glucose-Capillary: 104 mg/dL — ABNORMAL HIGH (ref 70–99)
Glucose-Capillary: 64 mg/dL — ABNORMAL LOW (ref 70–99)
Glucose-Capillary: 102 mg/dL — ABNORMAL HIGH (ref 70–99)
Glucose-Capillary: 153 mg/dL — ABNORMAL HIGH (ref 70–99)
Glucose-Capillary: 183 mg/dL — ABNORMAL HIGH (ref 70–99)

## 2019-09-28 LAB — BASIC METABOLIC PANEL
Anion gap: 9 (ref 5–15)
BUN: 42 mg/dL — ABNORMAL HIGH (ref 8–23)
CO2: 17 mmol/L — ABNORMAL LOW (ref 22–32)
Calcium: 9.2 mg/dL (ref 8.9–10.3)
Chloride: 119 mmol/L — ABNORMAL HIGH (ref 98–111)
Creatinine, Ser: 8.67 mg/dL — ABNORMAL HIGH (ref 0.44–1.00)
GFR calc Af Amer: 5 mL/min — ABNORMAL LOW (ref >60)
GFR calc non Af Amer: 4 mL/min — ABNORMAL LOW (ref >60)
Glucose, Bld: 33 mg/dL — CL (ref 70–99)
Potassium: 4.3 mmol/L (ref 3.5–5.1)
Sodium: 145 mmol/L (ref 135–145)

## 2019-09-28 LAB — CBC
HCT: 24.4 % — ABNORMAL LOW (ref 36.0–46.0)
Hemoglobin: 6.8 g/dL — CL (ref 12.0–15.0)
MCH: 27.2 pg (ref 26.0–34.0)
MCHC: 27.9 g/dL — ABNORMAL LOW (ref 30.0–36.0)
MCV: 97.6 fL (ref 80.0–100.0)
Platelets: 138 10*3/uL — ABNORMAL LOW (ref 150–400)
RBC: 2.5 MIL/uL — ABNORMAL LOW (ref 3.87–5.11)
RDW: 16.3 % — ABNORMAL HIGH (ref 11.5–15.5)
WBC: 5.3 10*3/uL (ref 4.0–10.5)
nRBC: 0.4 % — ABNORMAL HIGH (ref 0.0–0.2)

## 2019-09-28 LAB — HEMOGLOBIN AND HEMATOCRIT, BLOOD
HCT: 28 % — ABNORMAL LOW (ref 36.0–46.0)
Hemoglobin: 7.8 g/dL — ABNORMAL LOW (ref 12.0–15.0)

## 2019-09-28 LAB — HEMOGLOBIN A1C
Hgb A1c MFr Bld: 5.5 % (ref 4.8–5.6)
Mean Plasma Glucose: 111.15 mg/dL

## 2019-09-28 LAB — CREATININE, SERUM
Creatinine, Ser: 8.87 mg/dL — ABNORMAL HIGH (ref 0.44–1.00)
GFR calc Af Amer: 5 mL/min — ABNORMAL LOW (ref >60)
GFR calc non Af Amer: 4 mL/min — ABNORMAL LOW (ref >60)

## 2019-09-28 LAB — SARS CORONAVIRUS 2 BY RT PCR (HOSPITAL ORDER, PERFORMED IN ~~LOC~~ HOSPITAL LAB): SARS Coronavirus 2: NEGATIVE

## 2019-09-28 LAB — PREPARE RBC (CROSSMATCH)

## 2019-09-28 LAB — CBG MONITORING, ED: Glucose-Capillary: 60 mg/dL — ABNORMAL LOW (ref 70–99)

## 2019-09-28 LAB — HIV ANTIBODY (ROUTINE TESTING W REFLEX): HIV Screen 4th Generation wRfx: NONREACTIVE

## 2019-09-28 MED ORDER — DEXTROSE 50 % IV SOLN
INTRAVENOUS | Status: AC
  Administered 2019-09-28: 50 mL
  Filled 2019-09-28: qty 50

## 2019-09-28 MED ORDER — DEXTROSE 50 % IV SOLN: 1.0000 | Freq: Once | INTRAVENOUS | Status: DC

## 2019-09-28 MED ORDER — HYDRALAZINE HCL 25 MG PO TABS
25.0000 mg | ORAL_TABLET | Freq: Four times a day (QID) | ORAL | Status: DC | PRN
  Filled 2019-09-28: qty 1

## 2019-09-28 MED ORDER — DEXTROSE 10 % IV SOLN: INTRAVENOUS | Status: AC

## 2019-09-28 NOTE — Care Plan (Signed)
AM labs revealed Hgb < 7.0.  Patient denies any blood loss including GI bleeding, return of periods or vaginal bleeding, blood in urine.  Could be related to worsening renal function.  1 unit of PRBC ordered for transfusion.  Gilles Chiquito, MD

## 2019-09-28 NOTE — Progress Notes (Addendum)
[facility]  PROGRESS NOTE  Kristen English LYY:[id number] DOB: Sep 13, 1955 DOA: 09/27/2019 PCP: Wenda Low, MD   Brief HPI:   64 year old female with medical history of hypertension, diabetes mellitus type 2, CKD stage V, gout, who presented with hypoglycemia.  Patient said that she has been having this problem for about 2 3 weeks.  About 3 weeks ago she had episode of hypoglycemia and passed out.  She has been on Victoza and glipizide for her diabetes mellitus type 2.  Patient stopped taking Victoza 1 week ago.  Yesterday evening.  Patient woke up with mouth was numb.  She became dizzy and passed out.  EMS was called her blood glucose was 30. Patient has complicated renal history.  She is currently being worked up for right nephrectomy due to renal cyst that has unknown malignancy potential.  Patient is supposed to have peritoneal dialysis catheter placed.  She is followed by nephrology as outpatient.  She was started on Lasix 80 mg twice daily per her nephrologist Dr. Moshe Cipro but has been taking it every other day as she was nervous about side effects. In the ED patient was found to be hypoglycemic and was started on D10W.  Hypoglycemic agents have been held.   Subjective   Patient seen and examined, denies any complaints.  No chest pain or shortness of breath.  She was started on D10W.  Patient glucose is improved and D10W has been discontinued.   Assessment/Plan:     1. Hypoglycemia-associated with diabetes mellitus type 2.  Likely medication induced due to Victoza and sulfonylurea.  Both these medications can cause hypoglycemia in renal failure.  Both medications are on hold.  She is currently off D10W.  Continue to monitor CBG every 4 hours.  Once blood glucose is stabilized, will start sliding scale insulin with NovoLog. 2. Acute kidney injury on CKD stage V-patient baseline creatinine is around 7.5 as of 07/31/2019, she was started on Lasix 80 mg twice daily as outpatient.   She is in the process of getting peritoneal dialysis catheter placed.  She presented with BUN/creatinine of 45/9.30, creatinine has improved today to 8.67.  Follow BMP in a.m.  Continue Lasix 80 mg p.o. twice daily 3. Hypertension-blood pressure is elevated, continue amlodipine 10 mg daily, carvedilol 25 mg twice daily.  Will start hydralazine 25 mg p.o. every 6 hours as needed for BP greater than 160/100. 4. Hyperlipidemia-continue atorvastatin 5. Anemia of chronic disease-likely from underlying CKD stage V, hemoglobin dropped from 8.5-6.8.  Check FOBT.  1 unit PRBC has been ordered.  We will follow CBC in a.m.   Scheduled medications:   . amLODipine  10 mg Oral QHS  . aspirin  81 mg Oral QHS  . atorvastatin  40 mg Oral Daily  . carvedilol  25 mg Oral BID WC  . dextrose  1 ampule Intravenous Once  . furosemide  80 mg Oral BID  . heparin  5,000 Units Subcutaneous Q8H  . sodium chloride flush  3 mL Intravenous Q12H     Antibiotics:   Anti-infectives (From admission, onward)   None      CBG: Recent Labs  Lab 09/28/19 0539 09/28/19 0622 09/28/19 0646 09/28/19 0727 09/28/19 1159  GLUCAP 26* 32* 104* 64* 102*    SpO2: 100 %    CBC: Recent Labs  Lab 09/27/19 2016 09/27/19 2249 09/28/19 0438  WBC  --  5.3 5.3  HGB 8.5* 7.5* 6.8*  HCT 25.0* 26.5* 24.4*  MCV  --  96.4 97.6  PLT  --  149* 138*    Basic Metabolic Panel: Recent Labs  Lab 09/27/19 2016 09/27/19 2249 09/28/19 0438  NA 145  --  145  K 4.5  --  4.3  CL 118*  --  119*  CO2  --   --  17*  GLUCOSE 60*  --  33*  BUN 45*  --  42*  CREATININE 9.30* 8.87* 8.67*  CALCIUM  --   --  9.2     Liver Function Tests: No results for input(s): AST, ALT, ALKPHOS, BILITOT, PROT, ALBUMIN in the last 168 hours.    DVT prophylaxis: Heparin  Code Status: Full code  Family Communication: No family at bedside    Status is: Inpatient  Dispo: The patient is from: Home              Anticipated d/c is to:  Home              Anticipated d/c date is: 09/29/2019              Patient currently not medically stable for discharge  Barrier to discharge-persistent hypoglycemia requiring constant monitoring          Consultants:    Procedures:     Objective   Vitals:   09/28/19 1053 09/28/19 1121 09/28/19 1202 09/28/19 1357  BP: (!) 179/76 (!) 177/66 (!) 168/61 (!) 181/85  Pulse: 93 91 89 87  Resp: 16 16 18 16   Temp: 99.7 F (37.6 C) 99.4 F (37.4 C) 99 F (37.2 C) 99.5 F (37.5 C)  TempSrc: Oral Oral Oral Oral  SpO2: 97% 99% 97% 100%  Weight:      Height:        Intake/Output Summary (Last 24 hours) at 09/28/2019 1405 Last data filed at 09/28/2019 1357 Gross per 24 hour  Intake 828.66 ml  Output 300 ml  Net 528.66 ml    07/09 1901 - 07/11 0700 In: 273.7 [I.V.:273.7] Out: -   Filed Weights   09/28/19 0324  Weight: (!) 138.2 kg    Physical Examination:   General-appears in no acute distress Heart-S1-S2, regular, no murmur auscultated Lungs-clear to auscultation bilaterally, no wheezing or crackles auscultated Abdomen-soft, nontender, no organomegaly Extremities-no edema in the lower extremities Neuro-alert, oriented x3, no focal deficit noted   Data Reviewed:   Recent Results (from the past 240 hour(s))  SARS Coronavirus 2 by RT PCR (hospital order, performed in [facility] hospital lab) Nasopharyngeal Nasopharyngeal Swab     Status: None   Collection Time: 09/27/19  9:45 PM   Specimen: Nasopharyngeal Swab  Result Value Ref Range Status   SARS Coronavirus 2 NEGATIVE NEGATIVE Final    Comment: (NOTE) SARS-CoV-2 target nucleic acids are NOT DETECTED.  The SARS-CoV-2 RNA is generally detectable in upper and lower respiratory specimens during the acute phase of infection. The lowest concentration of SARS-CoV-2 viral copies this assay can detect is 250 copies / mL. A negative result does not preclude SARS-CoV-2 infection and should not be used as the  sole basis for treatment or other patient management decisions.  A negative result may occur with improper specimen collection / handling, submission of specimen other than nasopharyngeal swab, presence of viral mutation(s) within the areas targeted by this assay, and inadequate number of viral copies (<250 copies / mL). A negative result must be combined with clinical observations, patient history, and epidemiological information.  Fact Sheet for Patients:   StrictlyIdeas.no  Fact Sheet  for Healthcare Providers: BankingDealers.co.za  This test is not yet approved or  cleared by the [place] and has been authorized for detection and/or diagnosis of SARS-CoV-2 by FDA under an Emergency Use Authorization (EUA).  This EUA will remain in effect (meaning this test can be used) for the duration of the COVID-19 declaration under Section 564(b)(1) of the Act, 21 U.S.C. section 360bbb-3(b)(1), unless the authorization is terminated or revoked sooner.  Performed at [facility], [place] [address], [place], [place] [address]       [facility] If 7PM-7AM, please contact night-coverage at [email or web address], Office  [phone]   09/28/2019, 2:05 PM  LOS: 1 day

## 2019-09-28 NOTE — Progress Notes (Signed)
PT Cancellation Note  Patient Details Name: Kristen English MRN: [id number] DOB: [birth year]   Cancelled Treatment:    Reason Eval/Treat Not Completed: Other (comment)   Noted Hgb 6.8, with plans to transfuse;   Anticipate better ability to participate/better activity tolerance post transfusion;   Will follow up later today as time allows;  Otherwise, will follow up for PT tomorrow;   Thank you,  Roney Marion, PT  Acute Rehabilitation Services Pager [phone] Office [place] 09/28/2019, 10:26 AM

## 2019-09-29 LAB — TYPE AND SCREEN
ABO/RH(D): B POS
Antibody Screen: NEGATIVE
Unit division: 0

## 2019-09-29 LAB — CBC
HCT: 29.7 % — ABNORMAL LOW (ref 36.0–46.0)
Hemoglobin: 8.3 g/dL — ABNORMAL LOW (ref 12.0–15.0)
MCH: 27 pg (ref 26.0–34.0)
MCHC: 27.9 g/dL — ABNORMAL LOW (ref 30.0–36.0)
MCV: 96.7 fL (ref 80.0–100.0)
Platelets: 159 10*3/uL (ref 150–400)
RBC: 3.07 MIL/uL — ABNORMAL LOW (ref 3.87–5.11)
RDW: 16.8 % — ABNORMAL HIGH (ref 11.5–15.5)
WBC: 6 10*3/uL (ref 4.0–10.5)
nRBC: 0 % (ref 0.0–0.2)

## 2019-09-29 LAB — COMPREHENSIVE METABOLIC PANEL
ALT: 16 U/L (ref 0–44)
AST: 14 U/L — ABNORMAL LOW (ref 15–41)
Albumin: 1.9 g/dL — ABNORMAL LOW (ref 3.5–5.0)
Alkaline Phosphatase: 63 U/L (ref 38–126)
Anion gap: 7 (ref 5–15)
BUN: 44 mg/dL — ABNORMAL HIGH (ref 8–23)
CO2: 18 mmol/L — ABNORMAL LOW (ref 22–32)
Calcium: 9.4 mg/dL (ref 8.9–10.3)
Chloride: 115 mmol/L — ABNORMAL HIGH (ref 98–111)
Creatinine, Ser: 8.72 mg/dL — ABNORMAL HIGH (ref 0.44–1.00)
GFR calc Af Amer: 5 mL/min — ABNORMAL LOW (ref >60)
GFR calc non Af Amer: 4 mL/min — ABNORMAL LOW (ref >60)
Glucose, Bld: 177 mg/dL — ABNORMAL HIGH (ref 70–99)
Potassium: 4.4 mmol/L (ref 3.5–5.1)
Sodium: 140 mmol/L (ref 135–145)
Total Bilirubin: 0.2 mg/dL — ABNORMAL LOW (ref 0.3–1.2)
Total Protein: 5 g/dL — ABNORMAL LOW (ref 6.5–8.1)

## 2019-09-29 LAB — GLUCOSE, CAPILLARY
Glucose-Capillary: 22 mg/dL — CL (ref 70–99)
Glucose-Capillary: 185 mg/dL — ABNORMAL HIGH (ref 70–99)
Glucose-Capillary: 160 mg/dL — ABNORMAL HIGH (ref 70–99)
Glucose-Capillary: 156 mg/dL — ABNORMAL HIGH (ref 70–99)

## 2019-09-29 LAB — BPAM RBC
Blood Product Expiration Date: 202108112359
ISSUE DATE / TIME: 202107111050
Unit Type and Rh: 7300

## 2019-09-29 MED ORDER — VICTOZA 18 MG/3ML ~~LOC~~ SOPN
1.8000 mg | PEN_INJECTOR | Freq: Every day | SUBCUTANEOUS | Status: DC
Start: 2019-09-29 — End: 2019-10-08

## 2019-09-29 MED ORDER — FUROSEMIDE 80 MG PO TABS: 80.0000 mg | ORAL_TABLET | Freq: Two times a day (BID) | ORAL | Status: DC

## 2019-09-29 NOTE — Progress Notes (Signed)
   09/29/19 1100  PT Recommendation  Follow Up Recommendations No PT follow up  PT equipment None recommended by PT  Full note to follow. Kellie Chisolm W,PT Acute Rehabilitation Services Pager:  [phone]  Office:  [phone]

## 2019-09-29 NOTE — Discharge Summary (Addendum)
Physician Discharge Summary  Kristen English KXF:[id number] DOB: 1955/08/29 DOA: 09/27/2019  PCP: Wenda Low, MD  Admit date: 09/27/2019 Discharge date: 09/29/2019  Time spent: 50 minutes  Recommendations for Outpatient Follow-up:  1. Follow-up nephrology in 1 week 2. Follow PCP in 1 week  Discharge Diagnoses:  Active Problems:   Hypoglycemia associated with type 2 diabetes mellitus ([place])   Discharge Condition: Stable  Diet recommendation: Carb modified diet  Filed Weights   09/28/19 0324 09/29/19 0552  Weight: (!) 138.2 kg (!) 140 kg    History of present illness:  64 year old female with medical history of hypertension, diabetes mellitus type 2, CKD stage V, gout, who presented with hypoglycemia.  Patient said that she has been having this problem for about 2 3 weeks.  About 3 weeks ago she had episode of hypoglycemia and passed out.  She has been on Victoza and glyburide for her diabetes mellitus type 2.  Patient stopped taking Victoza 1 week ago.  Yesterday evening.  Patient woke up with mouth was numb.  She became dizzy and passed out.  EMS was called her blood glucose was 30. Patient has complicated renal history.  She is currently being worked up for right nephrectomy due to renal cyst that has unknown malignancy potential.  Patient is supposed to have peritoneal dialysis catheter placed.  She is followed by nephrology as outpatient.  She was started on Lasix 80 mg twice daily per her nephrologist Dr. Moshe Cipro but has been taking it every other day as she was nervous about side effects.   Hospital Course:   1. Hypoglycemia-associated with diabetes mellitus type 2.  Likely medication induced due to Victoza and sulfonylurea.  Both these medications can cause hypoglycemia in renal failure.  Both medications are on hold.  She is currently off D10W.  D10W has been discontinued.  Patient blood glucose is much better now.  I called and discussed with endocrinology Dr. Forde Dandy,  he recommends to discontinue glyburide and continue with Victoza 1.8 mg subcu daily.  Patient hemoglobin and A1c is 5.5.  She can follow-up with PCP in 1 week. 2. Acute kidney injury on CKD stage V-patient baseline creatinine is around 7.5 as of 07/31/2019, she was started on Lasix 80 mg twice daily as outpatient.  She is in the process of getting peritoneal dialysis catheter placed.  She presented with BUN/creatinine of 45/9.30, creatinine has improved today to 8.67. Continue Lasix 80 mg p.o. twice daily.  3. Hypertension-continue amlodipine 10 mg daily, carvedilol 25 mg twice daily.   4. Hyperlipidemia-continue atorvastatin 5. Anemia of chronic disease-likely from underlying CKD stage V, hemoglobin dropped from 8.5-6.8.  Patient received 1 unit PRBC.  Today hemoglobin is up to 8.3.  She can follow-up with PCP and nephrology as outpatient for further management.  Procedures:    Consultations:    Discharge Exam: Vitals:   09/29/19 0552 09/29/19 0756  BP: (!) 153/56 (!) 150/67  Pulse: 89 92  Resp: 20 18  Temp: 98.2 F (36.8 C) 98.1 F (36.7 C)  SpO2: 94% 98%    General: Appears in no acute distress Cardiovascular: S1-S2, regular, no murmur auscultated Respiratory: Clear to auscultation bilaterally  Discharge Instructions   Discharge Instructions    Diet - low sodium heart healthy   Complete by: As directed    Increase activity slowly   Complete by: As directed      Allergies as of 09/29/2019      Reactions   Morphine And Related Itching  Medication List    STOP taking these medications   glipiZIDE 5 MG tablet Commonly known as: GLUCOTROL   lovastatin 10 MG tablet Commonly known as: MEVACOR     TAKE these medications   amLODipine 10 MG tablet Commonly known as: NORVASC Take 10 mg by mouth at bedtime.   aspirin 81 MG tablet Take 81 mg by mouth daily.   atorvastatin 40 MG tablet Commonly known as: LIPITOR Take 40 mg by mouth daily.   carvedilol 25 MG  tablet Commonly known as: COREG Take 25 mg by mouth 2 (two) times daily with a meal.   colchicine 0.6 MG tablet Take 0.6 mg by mouth every 12 (twelve) hours as needed (gout.).   doxazosin 2 MG tablet Commonly known as: CARDURA Take 2 mg by mouth at bedtime.   FerrouSul 325 (65 FE) MG tablet Generic drug: ferrous sulfate Take 325 mg by mouth daily with breakfast.   furosemide 80 MG tablet Commonly known as: LASIX Take 1 tablet (80 mg total) by mouth 2 (two) times daily. What changed:   medication strength  how much to take  when to take this  reasons to take this   hydrOXYzine 25 MG tablet Commonly known as: ATARAX/VISTARIL Take 25 mg by mouth at bedtime as needed for itching.   levothyroxine 25 MCG tablet Commonly known as: SYNTHROID Take 25 mcg by mouth daily.   NovoFine 32G X 6 MM Misc Generic drug: Insulin Pen Needle   Victoza 18 MG/3ML Sopn Generic drug: liraglutide Inject 0.3 mLs (1.8 mg total) into the skin daily. What changed: when to take this      Allergies  Allergen Reactions  . Morphine And Related Itching      The results of significant diagnostics from this hospitalization (including imaging, microbiology, ancillary and laboratory) are listed below for reference.     Microbiology: Recent Results (from the past 240 hour(s))  SARS Coronavirus 2 by RT PCR (hospital order, performed in [facility] hospital lab) Nasopharyngeal Nasopharyngeal Swab     Status: None   Collection Time: 09/27/19  9:45 PM   Specimen: Nasopharyngeal Swab  Result Value Ref Range Status   SARS Coronavirus 2 NEGATIVE NEGATIVE Final    Comment: (NOTE) SARS-CoV-2 target nucleic acids are NOT DETECTED.  The SARS-CoV-2 RNA is generally detectable in upper and lower respiratory specimens during the acute phase of infection. The lowest concentration of SARS-CoV-2 viral copies this assay can detect is 250 copies / mL. A negative result does not preclude SARS-CoV-2  infection and should not be used as the sole basis for treatment or other patient management decisions.  A negative result may occur with improper specimen collection / handling, submission of specimen other than nasopharyngeal swab, presence of viral mutation(s) within the areas targeted by this assay, and inadequate number of viral copies (<250 copies / mL). A negative result must be combined with clinical observations, patient history, and epidemiological information.  Fact Sheet for Patients:   StrictlyIdeas.no  Fact Sheet for Healthcare Providers: BankingDealers.co.za  This test is not yet approved or  cleared by the [place] FDA and has been authorized for detection and/or diagnosis of SARS-CoV-2 by FDA under an Emergency Use Authorization (EUA).  This EUA will remain in effect (meaning this test can be used) for the duration of the COVID-19 declaration under Section 564(b)(1) of the Act, 21 U.S.C. section 360bbb-3(b)(1), unless the authorization is terminated or revoked sooner.  Performed at [facility], [place]  [address] [place], [place] [address]      Labs: Basic Metabolic Panel: Recent Labs  Lab 09/27/19 2016 09/27/19 2249 09/28/19 0438 09/29/19 0633  NA 145  --  145 140  K 4.5  --  4.3 4.4  CL 118*  --  119* 115*  CO2  --   --  17* 18*  GLUCOSE 60*  --  33* 177*  BUN 45*  --  42* 44*  CREATININE 9.30* 8.87* 8.67* 8.72*  CALCIUM  --   --  9.2 9.4   Liver Function Tests: Recent Labs  Lab 09/29/19 0633  AST 14*  ALT 16  ALKPHOS 63  BILITOT 0.2*  PROT 5.0*  ALBUMIN 1.9*   CBC: Recent Labs  Lab 09/27/19 2016 09/27/19 2249 09/28/19 0438 09/28/19 1550 09/29/19 0633  WBC  --  5.3 5.3  --  6.0  HGB 8.5* 7.5* 6.8* 7.8* 8.3*  HCT 25.0* 26.5* 24.4* 28.0* 29.7*  MCV  --  96.4 97.6  --  96.7  PLT  --  149* 138*  --  159    CBG: Recent Labs  Lab 09/28/19 1713 09/28/19 2122 09/29/19 0110  09/29/19 0452 09/29/19 0758  GLUCAP 153* 183* 185* 160* 156*       Signed:  Oswald Hillock MD.  [facility] 09/29/2019, 11:09 AM

## 2019-09-29 NOTE — Evaluation (Addendum)
Physical Therapy Evaluation Patient Details Name: Kristen English MRN: [id number] DOB: [birth year] Today's Date: 09/29/2019   History of Present Illness  64 year old female with medical history of hypertension, diabetes mellitus type 2, CKD stage V, gout, who presented with hypoglycemia.  Patient said that she has been having this problem for about 2 3 weeks.  About 3 weeks ago she had episode of hypoglycemia and passed out.  She has been on Victoza and glyburide for her diabetes mellitus type 2.  Patient stopped taking Victoza 1 week ago  Clinical Impression  Pt admitted with above diagnosis. Pt was able to ambulate with supervision with RW (pt states she can borrow a RW from a friend).  Pt also has cane she can use and appears that she would be safe with this as well.  Pt shown how to do stretches as she has some right hip and leg pain. Pt declined PT follow up at this time and states she is ready to go home.  D/C today per MD.   Follow Up Recommendations No PT follow up    Equipment Recommendations  None recommended by PT    Recommendations for Other Services       Precautions / Restrictions Precautions Precautions: None Restrictions Weight Bearing Restrictions: No      Mobility  Bed Mobility Overal bed mobility: Independent                Transfers Overall transfer level: Independent                  Ambulation/Gait Ambulation/Gait assistance: Supervision Gait Distance (Feet): 180 Feet Assistive device: Rolling walker (2 wheeled) Gait Pattern/deviations: Step-through pattern;Decreased stride length;Wide base of support   Gait velocity interpretation: <1.31 ft/sec, indicative of household ambulator General Gait Details: Pt was able to ambulate with RW wtih good stability. States she can borrow a RW or use her cane. Did walk pt without device and she was steady but device makes her feel more confident.   Stairs         General stair comments: declined  Writer    Modified Rankin (Stroke Patients Only)       Balance Overall balance assessment: No apparent balance deficits (not formally assessed)                                           Pertinent Vitals/Pain Pain Assessment: Faces Faces Pain Scale: Hurts even more Pain Location: right hip and leg Pain Descriptors / Indicators: Aching;Grimacing;Guarding Pain Intervention(s): Limited activity within patient's tolerance;Monitored during session;Repositioned;Utilized relaxation techniques    Home Living Family/patient expects to be discharged to:: Private residence Living Arrangements: Alone Available Help at Discharge: Family;Available PRN/intermittently Type of Home: House Home Access: Stairs to enter Entrance Stairs-Rails: None Entrance Stairs-Number of Steps: 3 Home Layout: One level Home Equipment: Cane - single point;Bedside commode;Hand held shower head;Grab bars - tub/shower      Prior Function Level of Independence: Independent with assistive device(s);Needs assistance   Gait / Transfers Assistance Needed: used cane when needed  ADL's / Homemaking Assistance Needed: B/D self        Hand Dominance   Dominant Hand: Right    Extremity/Trunk Assessment   Upper Extremity Assessment Upper Extremity Assessment: Defer to OT evaluation    Lower Extremity Assessment Lower Extremity Assessment: RLE deficits/detail RLE Deficits / Details: soreness  due to right muscle per pt    Cervical / Trunk Assessment Cervical / Trunk Assessment: Normal  Communication   Communication: No difficulties  Cognition Arousal/Alertness: Awake/alert Behavior During Therapy: WFL for tasks assessed/performed Overall Cognitive Status: Within Functional Limits for tasks assessed                                        General Comments      Exercises General Exercises - Lower Extremity Ankle Circles/Pumps: AROM;5  reps;Both;Supine Heel Slides: AROM;Both;5 reps;Supine Other Exercises Other Exercises: Showed pt piriformis stretch Other Exercises: also showed pt heel cord and hamstring stretch using gait belt.  Issues gait belt to pt   Assessment/Plan    PT Assessment Patent does not need any further PT services  PT Problem List         PT Treatment Interventions      PT Goals (Current goals can be found in the Care Plan section)  Acute Rehab PT Goals Patient Stated Goal: to go home today PT Goal Formulation: All assessment and education complete, DC therapy    Frequency     Barriers to discharge        Co-evaluation               AM-PAC PT "6 Clicks" Mobility  Outcome Measure Help needed turning from your back to your side while in a flat bed without using bedrails?: None Help needed moving from lying on your back to sitting on the side of a flat bed without using bedrails?: None Help needed moving to and from a bed to a chair (including a wheelchair)?: None Help needed standing up from a chair using your arms (e.g., wheelchair or bedside chair)?: None Help needed to walk in hospital room?: None Help needed climbing 3-5 steps with a railing? : None 6 Click Score: 24    End of Session Equipment Utilized During Treatment: Gait belt Activity Tolerance: Patient tolerated treatment well Patient left: in chair;with call bell/phone within reach Nurse Communication: Mobility status PT Visit Diagnosis: Muscle weakness (generalized) (M62.81)    Time: 0350-0938 PT Time Calculation (min) (ACUTE ONLY): 17 min   Charges:   PT Evaluation $PT Eval Low Complexity: 1 Low          Ritisha Deitrick W,PT Acute Rehabilitation Services Pager:  [phone]  Office:  [phone]    Denice Paradise 09/29/2019, 2:45 PM

## 2019-10-02 ENCOUNTER — Other Ambulatory Visit: Payer: Self-pay

## 2019-10-02 ENCOUNTER — Inpatient Hospital Stay (HOSPITAL_COMMUNITY)
Admission: EM | Admit: 2019-10-02 | Discharge: 2019-10-07 | DRG: 673 | Disposition: A | Discharge status: Home / Self Care | Payer: Medicare HMO | Attending: Internal Medicine | Admitting: Internal Medicine

## 2019-10-02 ENCOUNTER — Encounter (HOSPITAL_COMMUNITY): Payer: Self-pay | Admitting: *Deleted

## 2019-10-02 DIAGNOSIS — N185 Chronic kidney disease, stage 5: Secondary | ICD-10-CM | POA: Diagnosis not present

## 2019-10-02 DIAGNOSIS — E872 Acidosis, unspecified: Secondary | ICD-10-CM

## 2019-10-02 DIAGNOSIS — N19 Unspecified kidney failure: Secondary | ICD-10-CM | POA: Diagnosis not present

## 2019-10-02 DIAGNOSIS — Z96653 Presence of artificial knee joint, bilateral: Secondary | ICD-10-CM | POA: Diagnosis present

## 2019-10-02 DIAGNOSIS — D631 Anemia in chronic kidney disease: Secondary | ICD-10-CM | POA: Diagnosis present

## 2019-10-02 DIAGNOSIS — E119 Type 2 diabetes mellitus without complications: Secondary | ICD-10-CM | POA: Diagnosis not present

## 2019-10-02 DIAGNOSIS — M1990 Unspecified osteoarthritis, unspecified site: Secondary | ICD-10-CM | POA: Diagnosis present

## 2019-10-02 DIAGNOSIS — M109 Gout, unspecified: Secondary | ICD-10-CM | POA: Diagnosis present

## 2019-10-02 DIAGNOSIS — R531 Weakness: Secondary | ICD-10-CM | POA: Diagnosis not present

## 2019-10-02 DIAGNOSIS — E039 Hypothyroidism, unspecified: Secondary | ICD-10-CM | POA: Diagnosis present

## 2019-10-02 DIAGNOSIS — D649 Anemia, unspecified: Secondary | ICD-10-CM

## 2019-10-02 DIAGNOSIS — Z7984 Long term (current) use of oral hypoglycemic drugs: Secondary | ICD-10-CM

## 2019-10-02 DIAGNOSIS — N186 End stage renal disease: Secondary | ICD-10-CM | POA: Diagnosis not present

## 2019-10-02 DIAGNOSIS — Z992 Dependence on renal dialysis: Secondary | ICD-10-CM

## 2019-10-02 DIAGNOSIS — Z79890 Hormone replacement therapy (postmenopausal): Secondary | ICD-10-CM

## 2019-10-02 DIAGNOSIS — Z20822 Contact with and (suspected) exposure to covid-19: Secondary | ICD-10-CM | POA: Diagnosis not present

## 2019-10-02 DIAGNOSIS — N179 Acute kidney failure, unspecified: Secondary | ICD-10-CM | POA: Diagnosis not present

## 2019-10-02 DIAGNOSIS — E11649 Type 2 diabetes mellitus with hypoglycemia without coma: Secondary | ICD-10-CM | POA: Diagnosis present

## 2019-10-02 DIAGNOSIS — R601 Generalized edema: Secondary | ICD-10-CM | POA: Diagnosis not present

## 2019-10-02 DIAGNOSIS — Z8249 Family history of ischemic heart disease and other diseases of the circulatory system: Secondary | ICD-10-CM

## 2019-10-02 DIAGNOSIS — I10 Essential (primary) hypertension: Secondary | ICD-10-CM | POA: Diagnosis not present

## 2019-10-02 DIAGNOSIS — Z6841 Body Mass Index (BMI) 40.0 and over, adult: Secondary | ICD-10-CM

## 2019-10-02 DIAGNOSIS — Z7982 Long term (current) use of aspirin: Secondary | ICD-10-CM

## 2019-10-02 DIAGNOSIS — I120 Hypertensive chronic kidney disease with stage 5 chronic kidney disease or end stage renal disease: Secondary | ICD-10-CM | POA: Diagnosis not present

## 2019-10-02 DIAGNOSIS — E8809 Other disorders of plasma-protein metabolism, not elsewhere classified: Secondary | ICD-10-CM | POA: Diagnosis present

## 2019-10-02 DIAGNOSIS — E8770 Fluid overload, unspecified: Secondary | ICD-10-CM | POA: Diagnosis not present

## 2019-10-02 DIAGNOSIS — Z885 Allergy status to narcotic agent status: Secondary | ICD-10-CM | POA: Diagnosis not present

## 2019-10-02 DIAGNOSIS — T82898A Other specified complication of vascular prosthetic devices, implants and grafts, initial encounter: Secondary | ICD-10-CM | POA: Diagnosis not present

## 2019-10-02 DIAGNOSIS — N2581 Secondary hyperparathyroidism of renal origin: Secondary | ICD-10-CM | POA: Diagnosis not present

## 2019-10-02 DIAGNOSIS — E1122 Type 2 diabetes mellitus with diabetic chronic kidney disease: Secondary | ICD-10-CM | POA: Diagnosis present

## 2019-10-02 DIAGNOSIS — R609 Edema, unspecified: Secondary | ICD-10-CM | POA: Diagnosis not present

## 2019-10-02 DIAGNOSIS — Z79899 Other long term (current) drug therapy: Secondary | ICD-10-CM

## 2019-10-02 DIAGNOSIS — R0601 Orthopnea: Secondary | ICD-10-CM | POA: Diagnosis present

## 2019-10-02 DIAGNOSIS — E1129 Type 2 diabetes mellitus with other diabetic kidney complication: Secondary | ICD-10-CM | POA: Diagnosis not present

## 2019-10-02 DIAGNOSIS — Z4901 Encounter for fitting and adjustment of extracorporeal dialysis catheter: Secondary | ICD-10-CM | POA: Diagnosis not present

## 2019-10-02 DIAGNOSIS — J9602 Acute respiratory failure with hypercapnia: Secondary | ICD-10-CM | POA: Diagnosis not present

## 2019-10-02 DIAGNOSIS — R410 Disorientation, unspecified: Secondary | ICD-10-CM | POA: Diagnosis not present

## 2019-10-02 LAB — URINALYSIS, ROUTINE W REFLEX MICROSCOPIC
Bilirubin Urine: NEGATIVE
Glucose, UA: >=500 mg/dL — AB
Ketones, ur: NEGATIVE mg/dL
Leukocytes,Ua: NEGATIVE
Nitrite: NEGATIVE
Protein, ur: >=300 mg/dL — AB
Specific Gravity, Urine: 1.02 (ref 1.005–1.030)
pH: 5 (ref 5.0–8.0)

## 2019-10-02 LAB — CBC
HCT: 30.3 % — ABNORMAL LOW (ref 36.0–46.0)
Hemoglobin: 8.5 g/dL — ABNORMAL LOW (ref 12.0–15.0)
MCH: 26.8 pg (ref 26.0–34.0)
MCHC: 28.1 g/dL — ABNORMAL LOW (ref 30.0–36.0)
MCV: 95.6 fL (ref 80.0–100.0)
Platelets: 152 10*3/uL (ref 150–400)
RBC: 3.17 MIL/uL — ABNORMAL LOW (ref 3.87–5.11)
RDW: 16.4 % — ABNORMAL HIGH (ref 11.5–15.5)
WBC: 7.5 10*3/uL (ref 4.0–10.5)
nRBC: 0 % (ref 0.0–0.2)

## 2019-10-02 LAB — BASIC METABOLIC PANEL
Anion gap: 9 (ref 5–15)
BUN: 54 mg/dL — ABNORMAL HIGH (ref 8–23)
CO2: 17 mmol/L — ABNORMAL LOW (ref 22–32)
Calcium: 9.8 mg/dL (ref 8.9–10.3)
Chloride: 115 mmol/L — ABNORMAL HIGH (ref 98–111)
Creatinine, Ser: 9.1 mg/dL — ABNORMAL HIGH (ref 0.44–1.00)
GFR calc Af Amer: 5 mL/min — ABNORMAL LOW (ref >60)
GFR calc non Af Amer: 4 mL/min — ABNORMAL LOW (ref >60)
Glucose, Bld: 133 mg/dL — ABNORMAL HIGH (ref 70–99)
Potassium: 4.6 mmol/L (ref 3.5–5.1)
Sodium: 141 mmol/L (ref 135–145)

## 2019-10-02 LAB — GLUCOSE, CAPILLARY: Glucose-Capillary: 165 mg/dL — ABNORMAL HIGH (ref 70–99)

## 2019-10-02 MED ORDER — SODIUM CHLORIDE 0.9% FLUSH: 3.0000 mL | Freq: Once | INTRAVENOUS | Status: DC

## 2019-10-02 MED ORDER — SODIUM CHLORIDE 0.9 % IV SOLN: 250.0000 mL | INTRAVENOUS | Status: DC | PRN

## 2019-10-02 MED ORDER — SODIUM CHLORIDE 0.9% FLUSH
3.0000 mL | Freq: Two times a day (BID) | INTRAVENOUS | Status: DC
  Administered 2019-10-02 – 2019-10-06 (×6): 3 mL via INTRAVENOUS

## 2019-10-02 MED ORDER — ACETAMINOPHEN 650 MG RE SUPP: 650.0000 mg | Freq: Four times a day (QID) | RECTAL | Status: DC | PRN

## 2019-10-02 MED ORDER — ONDANSETRON HCL 4 MG/2ML IJ SOLN: 4.0000 mg | Freq: Four times a day (QID) | INTRAMUSCULAR | Status: DC | PRN

## 2019-10-02 MED ORDER — ATORVASTATIN CALCIUM 40 MG PO TABS
40.0000 mg | ORAL_TABLET | Freq: Every day | ORAL | Status: DC
  Administered 2019-10-02 – 2019-10-07 (×6): 40 mg via ORAL
  Filled 2019-10-02 (×6): qty 1

## 2019-10-02 MED ORDER — FUROSEMIDE 80 MG PO TABS
80.0000 mg | ORAL_TABLET | Freq: Two times a day (BID) | ORAL | Status: DC
  Administered 2019-10-02 – 2019-10-05 (×5): 80 mg via ORAL
  Filled 2019-10-02 (×5): qty 1

## 2019-10-02 MED ORDER — ASPIRIN EC 81 MG PO TBEC
81.0000 mg | DELAYED_RELEASE_TABLET | Freq: Every day | ORAL | Status: DC
  Administered 2019-10-02 – 2019-10-07 (×6): 81 mg via ORAL
  Filled 2019-10-02 (×6): qty 1

## 2019-10-02 MED ORDER — ACETAMINOPHEN 325 MG PO TABS: 650.0000 mg | ORAL_TABLET | Freq: Four times a day (QID) | ORAL | Status: DC | PRN

## 2019-10-02 MED ORDER — POLYETHYLENE GLYCOL 3350 17 G PO PACK: 17.0000 g | PACK | Freq: Every day | ORAL | Status: DC | PRN

## 2019-10-02 MED ORDER — SODIUM BICARBONATE 650 MG PO TABS
650.0000 mg | ORAL_TABLET | Freq: Two times a day (BID) | ORAL | Status: DC
  Administered 2019-10-02 – 2019-10-04 (×4): 650 mg via ORAL
  Filled 2019-10-02 (×4): qty 1

## 2019-10-02 MED ORDER — ENOXAPARIN SODIUM 30 MG/0.3ML ~~LOC~~ SOLN: 30.0000 mg | SUBCUTANEOUS | Status: DC

## 2019-10-02 MED ORDER — SODIUM CHLORIDE 0.9% FLUSH: 3.0000 mL | INTRAVENOUS | Status: DC | PRN

## 2019-10-02 MED ORDER — LEVOTHYROXINE SODIUM 25 MCG PO TABS
25.0000 ug | ORAL_TABLET | Freq: Every day | ORAL | Status: DC
  Administered 2019-10-03 – 2019-10-06 (×4): 25 ug via ORAL
  Filled 2019-10-02 (×5): qty 1

## 2019-10-02 MED ORDER — HEPARIN SODIUM (PORCINE) 5000 UNIT/ML IJ SOLN
5000.0000 [IU] | Freq: Three times a day (TID) | INTRAMUSCULAR | Status: DC
  Administered 2019-10-02 – 2019-10-06 (×11): 5000 [IU] via SUBCUTANEOUS
  Filled 2019-10-02 (×11): qty 1

## 2019-10-02 MED ORDER — DOXAZOSIN MESYLATE 2 MG PO TABS
2.0000 mg | ORAL_TABLET | Freq: Every day | ORAL | Status: DC
  Administered 2019-10-02 – 2019-10-06 (×4): 2 mg via ORAL
  Filled 2019-10-02 (×6): qty 1

## 2019-10-02 MED ORDER — ONDANSETRON HCL 4 MG PO TABS: 4.0000 mg | ORAL_TABLET | Freq: Four times a day (QID) | ORAL | Status: DC | PRN

## 2019-10-02 MED ORDER — CARVEDILOL 25 MG PO TABS
25.0000 mg | ORAL_TABLET | Freq: Two times a day (BID) | ORAL | Status: DC
  Administered 2019-10-02 – 2019-10-07 (×9): 25 mg via ORAL
  Filled 2019-10-02 (×9): qty 1

## 2019-10-02 MED ORDER — AMLODIPINE BESYLATE 10 MG PO TABS
10.0000 mg | ORAL_TABLET | Freq: Every day | ORAL | Status: DC
  Administered 2019-10-02 – 2019-10-06 (×4): 10 mg via ORAL
  Filled 2019-10-02 (×5): qty 1

## 2019-10-02 NOTE — Consult Note (Addendum)
Kristen English Admit Date: 10/02/2019 10/02/2019 Kristen English Requesting Physician:  Charlynne Cousins, MD  Reason for Consult:  Kristen English on ckd, needing to start IHD  HPI:  64 year old black female with a PMH of HTN, DM2, CKD5, gout, right renal mass who presents to the hospital for needing to start hemodialysis. She is a patient of Dr. Moshe Cipro at [facility] and had been planning to start peritoneal dialysis, however given the fact that she was found to have potential RCC (along with the potential need for a nephrectomy) that was placed on hold. Patient reports that she has been putting off on starting dialysis for some time. She was recently admitted here at [facility] for hypoglycemia. She was also found to be anemic necessitating 1U PRBC. She does endorse worsening swelling particularly in her bilateral LE's, nausea, loss of appetite, and dysgeusia. She also reports some episodic orthopnea. She denies fevers, chest pain, PND, vomiting, episodic confusion, tremors, abnormal sleep patterns, skin changes.   PMH Incudes: Past Medical History:  Diagnosis Date  . Arthritis   . Chronic kidney disease    protein in urine  . Diabetes mellitus without complication ([place])   . Gout   . Hypertension        Creatinine (mg/dL)  Date Value  08/29/2018 4.29 (HH)  02/21/2018 4.41 (HH)  02/14/2017 4.2 (HH)  11/22/2016 3.8 (HH)  11/01/2016 4.1 (HH)  08/01/2016 4.0 (HH)  04/27/2016 3.4 (HH)  01/27/2016 3.6 (HH)  11/04/2015 3.7 (HH)  07/30/2015 3.6 (HH)  04/26/2015 3.5 ([place])  01/28/2015 3.3 (HH)   Creatinine, Ser (mg/dL)  Date Value  10/02/2019 9.10 (H)  09/29/2019 8.72 (H)  09/28/2019 8.67 (H)  09/27/2019 8.87 (H)  09/27/2019 9.30 (H)  07/31/2019 7.43 (H)  08/22/2017 4.23 (HH)  ] I/Os:  ROS: Balance of 12 systems is negative w/ exceptions as above  PMH  Past Medical History:  Diagnosis Date  . Arthritis   . Chronic kidney disease    protein in urine  . Diabetes mellitus without  complication ([place])   . Gout   . Hypertension    PSH  Past Surgical History:  Procedure Laterality Date  . COLONOSCOPY WITH PROPOFOL N/A 05/11/2014   Procedure: COLONOSCOPY WITH PROPOFOL;  Surgeon: Garlan Fair, MD;  Location: [facility]COPY;  Service: Endoscopy;  Laterality: N/A;  . FOOT SURGERY Left 2007  . JOINT REPLACEMENT  2009   bilateral knees  . TOTAL KNEE ARTHROPLASTY     bilateral   FH  Family History  Problem Relation Age of Onset  . Heart attack Mother   . Heart attack Father    [place]  reports that she has never smoked. She has never used smokeless tobacco. She reports that she does not drink alcohol and does not use drugs. Allergies  Allergies  Allergen Reactions  . Morphine And Related Itching   Home medications Prior to Admission medications   Medication Sig Start Date End Date Taking? Authorizing Provider  amLODipine (NORVASC) 10 MG tablet Take 10 mg by mouth at bedtime.     [provider]  aspirin 81 MG tablet Take 81 mg by mouth daily.     [provider]  atorvastatin (LIPITOR) 40 MG tablet Take 40 mg by mouth daily.  11/01/15   [provider]  carvedilol (COREG) 25 MG tablet Take 25 mg by mouth 2 (two) times daily with a meal.  02/24/13   [provider]  colchicine 0.6 MG tablet Take 0.6  mg by mouth every 12 (twelve) hours as needed (gout.).     [provider]  doxazosin (CARDURA) 2 MG tablet Take 2 mg by mouth at bedtime.  08/05/19   [provider]  ferrous sulfate (FERROUSUL) 325 (65 FE) MG tablet Take 325 mg by mouth daily with breakfast.     [provider]  furosemide (LASIX) 80 MG tablet Take 1 tablet (80 mg total) by mouth 2 (two) times daily. 09/29/19   Oswald Hillock, MD  hydrOXYzine (ATARAX/VISTARIL) 25 MG tablet Take 25 mg by mouth at bedtime as needed for itching.     [provider]  levothyroxine (SYNTHROID) 25 MCG tablet Take 25 mcg by mouth daily. 08/05/19   [provider]  liraglutide (VICTOZA) 18 MG/3ML SOPN Inject 0.3 mLs (1.8 mg total) into the skin daily. 09/29/19   Oswald Hillock, MD  NOVOFINE 32G X 6 MM MISC  08/07/14   [provider]  glipiZIDE (GLUCOTROL) 5 MG tablet Take 5 mg by mouth 3 (three) times daily with meals.  09/27/19  [provider]    Current Medications Scheduled Meds: . sodium chloride flush  3 mL Intravenous Once   Continuous Infusions: PRN Meds:.  CBC Recent Labs  Lab 09/28/19 0438 09/28/19 0438 09/28/19 1550 09/29/19 0633 10/02/19 1233  WBC 5.3  --   --  6.0 7.5  HGB 6.8*   < > 7.8* 8.3* 8.5*  HCT 24.4*   < > 28.0* 29.7* 30.3*  MCV 97.6  --   --  96.7 95.6  PLT 138*  --   --  159 152   < > = values in this interval not displayed.   Basic Metabolic Panel Recent Labs  Lab 09/27/19 2016 09/27/19 2249 09/28/19 0438 09/29/19 0633 10/02/19 1233  NA 145  --  145 140 141  K 4.5  --  4.3 4.4 4.6  CL 118*  --  119* 115* 115*  CO2  --   --  17* 18* 17*  GLUCOSE 60*  --  33* 177* 133*  BUN 45*  --  42* 44* 54*  CREATININE 9.30* 8.87* 8.67* 8.72* 9.10*  CALCIUM  --   --  9.2 9.4 9.8    Physical Exam  Blood pressure (!) 151/96, pulse 91, temperature 98.2 F (36.8 C), temperature source Oral, resp. rate 18, height 5' 8.5" (1.74 m), weight (!) 138.8 kg, SpO2 99 %. 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 I do believe she is exhibiting early uremic symptoms and at this junction she should start hemodialysis while in-house. Will consult IR for tunneled dialysis catheter. Will need a slow start protocol for HD. Patient in agreement with starting IHD 2. Agree with IV Lasix for now given edema 3. Check phos and track calcium for now. If a phosphorus binder is needed then would recommend starting Renvela (dose dependent on phos level). 4. Please check hepatitis B panel (sag, sab, core ab). Please  check quantiferon as well in order to prepare for outpatient dialysis unit placement. 5. Start sodium bicarbonate 1300mg  TID for now until she starts hemodialysis 6. Continue home anti-hypertensives, with the addition of IV diuresis, should see some more improvement in BP 7. Check iron panel. 8. Renal diet (K and phos restriction, also recommend sodium restriction if possible given volume status)  Plans communicated to the primary team. Thank you for this consult. Will continue to follow along with the primary team. Please page the rounding or on-call nephrologist with any questions/concerns.  Kristen English  10/02/2019, 6:09 PM

## 2019-10-02 NOTE — ED Provider Notes (Signed)
[facility] EMERGENCY DEPARTMENT Provider Note   CSN: [id number] Arrival date & time: 10/02/19  1158     History Chief Complaint  Patient presents with  . Weakness    Kristen English is a 64 y.o. female with history of hypertension, diabetes, CKD presents to the ED from PCP for hemodialysis.  Patient states she went to an appointment with her doctor for hospital follow-up.  Dr. Deforest Hoyles told her that he thought she should be started on dialysis right away.  Apparently, Dr. Deforest Hoyles spoke to Dr. Moshe Cipro with nephrology to facilitate starting patient on dialysis soon as possible.  She was referred to the ED to have this done.  Reports long history of kidney disease.  She is not sure what her baseline creatinine is.  Reports increased swelling throughout her body including eyelids, distal arms, abdomen and legs that has gradually worsened.  She has occasional shortness of breath if she is laying down but has been able to still sleep on her back.  Developed a cough over the last few days that she attributes to "catching a virus" while she was admitted recently to the hospital.  No fever.  No chest pain.  She is still urinating and voids 4-5 times a day.  Reports worsening fatigue and no appetite, has not eaten much since hospital discharge.  On Lasix 80 mg twice daily.  HPI     Past Medical History:  Diagnosis Date  . Arthritis   . Chronic kidney disease    protein in urine  . Diabetes mellitus without complication ([place])   . Gout   . Hypertension     Patient Active Problem List   Diagnosis Date Noted  . AKI (acute kidney injury) ([place]) 10/02/2019  . Hypoglycemia associated with type 2 diabetes mellitus ([place]) 09/27/2019  . Chronic renal insufficiency, stage 4 (severe) ([place]) 09/05/2018  . Plasma cell dyscrasia 04/27/2014  . 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 COLONOSCOPY WITH  PROPOFOL N/A 05/11/2014   Procedure: COLONOSCOPY WITH PROPOFOL;  Surgeon: Garlan Fair, MD;  Location: [facility];  Service: Endoscopy;  Laterality: N/A;  . FOOT SURGERY Left 2007  . JOINT REPLACEMENT  2009   bilateral knees  . TOTAL KNEE ARTHROPLASTY     bilateral     OB History   No obstetric history on file.     Family History  Problem Relation Age of Onset  . Heart attack Mother   . Heart attack Father     Social History   Tobacco Use  . Smoking status: Never Smoker  . Smokeless tobacco: Never Used  Substance Use Topics  . Alcohol use: No  . Drug use: No    Home Medications Prior to Admission medications   Medication Sig Start Date End Date Taking? Authorizing Provider  amLODipine (NORVASC) 10 MG tablet Take 10 mg by mouth at bedtime.     [provider]  aspirin 81 MG tablet Take 81 mg by mouth daily.     [provider]  atorvastatin (LIPITOR) 40 MG tablet Take 40 mg by mouth daily.  11/01/15   [provider]  carvedilol (COREG) 25 MG tablet Take 25 mg by mouth 2 (two) times daily with a meal.  02/24/13   [provider]  colchicine 0.6 MG tablet Take 0.6 mg by mouth every 12 (twelve) hours as needed (gout.).     [provider]  doxazosin (CARDURA) 2 MG tablet Take 2 mg by mouth at bedtime.  08/05/19   [provider]  ferrous sulfate (FERROUSUL) 325 (65 FE) MG tablet Take 325 mg by mouth daily with breakfast.     [provider]  furosemide (LASIX) 80 MG tablet Take 1 tablet (80 mg total) by mouth 2 (two) times daily. 09/29/19   Oswald Hillock, MD  hydrOXYzine (ATARAX/VISTARIL) 25 MG tablet Take 25 mg by mouth at bedtime as needed for itching.     [provider]  levothyroxine (SYNTHROID) 25 MCG tablet Take 25 mcg by mouth daily. 08/05/19   [provider]  liraglutide (VICTOZA) 18 MG/3ML SOPN Inject 0.3 mLs (1.8 mg total) into the skin daily. 09/29/19   Oswald Hillock, MD  NOVOFINE 32G  X 6 MM MISC  08/07/14   [provider]  glipiZIDE (GLUCOTROL) 5 MG tablet Take 5 mg by mouth 3 (three) times daily with meals.  09/27/19  [provider]    Allergies    Morphine and related  Review of Systems   Review of Systems  Respiratory: Positive for cough and shortness of breath.   Cardiovascular: Positive for leg swelling.  Gastrointestinal: Positive for abdominal distention.  All other systems reviewed and are negative.   Physical Exam Updated Vital Signs BP (!) 151/96 (BP Location: Right Arm)   Pulse 91   Temp 98.2 F (36.8 C) (Oral)   Resp 18   Ht 5' 8.5" (1.74 m)   Wt (!) 138.8 kg   SpO2 99%   BMI 45.85 kg/m   Physical Exam Vitals and nursing note reviewed.  Constitutional:      General: She is not in acute distress.    Appearance: She is well-developed.     Comments: NAD.  HENT:     Head: Normocephalic and atraumatic.     Right Ear: External ear normal.     Left Ear: External ear normal.     Nose: Nose normal.  Eyes:     General: No scleral icterus.    Conjunctiva/sclera: Conjunctivae normal.     Comments: Bilateral eyelid edema, mild  Cardiovascular:     Rate and Rhythm: Normal rate and regular rhythm.     Heart sounds: Normal heart sounds.     Comments: 2+ pitting edema pretibially, bilateral and equal.  No calf tenderness. Pulmonary:     Effort: Pulmonary effort is normal.     Comments: Diminished air sounds in lower lobes, difficult exam due to body habitus.  No rales.  Speaking in full sentences.  No respiratory distress. Abdominal:     General: There is distension.     Palpations: Abdomen is soft.     Tenderness: There is no abdominal tenderness.     Comments: 1+ pitting edema on the abdomen  Musculoskeletal:        General: No deformity. Normal range of motion.     Cervical back: Normal range of motion and neck supple.  Skin:    General: Skin is warm and dry.     Capillary Refill: Capillary refill takes less than 2  seconds.  Neurological:     Mental Status: She is alert and oriented to person, place, and time.  Psychiatric:        Behavior: Behavior normal.        Thought Content: Thought content normal.        Judgment: Judgment normal.     ED Results /  Procedures / Treatments   Labs (all labs ordered are listed, but only abnormal results are displayed) Labs Reviewed  BASIC METABOLIC PANEL - Abnormal; Notable for the following components:      Result Value   Chloride 115 (*)    CO2 17 (*)    Glucose, Bld 133 (*)    BUN 54 (*)    Creatinine, Ser 9.10 (*)    GFR calc non Af Amer 4 (*)    GFR calc Af Amer 5 (*)    All other components within normal limits  CBC - Abnormal; Notable for the following components:   RBC 3.17 (*)    Hemoglobin 8.5 (*)    HCT 30.3 (*)    MCHC 28.1 (*)    RDW 16.4 (*)    All other components within normal limits  URINALYSIS, ROUTINE W REFLEX MICROSCOPIC  CBG MONITORING, ED    EKG None  Radiology No results found.  Procedures .Critical Care Performed by: Kinnie Feil, PA-C Authorized by: Kinnie Feil, PA-C   Critical care provider statement:    Critical care time (minutes):  45   Critical care was necessary to treat or prevent imminent or life-threatening deterioration of the following conditions:  Renal failure   Critical care was time spent personally by me on the following activities:  Discussions with consultants, evaluation of patient's response to treatment, examination of patient, ordering and performing treatments and interventions, ordering and review of laboratory studies, ordering and review of radiographic studies, pulse oximetry, re-evaluation of patient's condition, obtaining history from patient or surrogate and review of old charts   I assumed direction of critical care for this patient from another provider in my specialty: no     (including critical care time)  Medications Ordered in ED Medications  sodium chloride  flush (NS) 0.9 % injection 3 mL (has no administration in time range)    ED Course  I have reviewed the triage vital signs and the nursing notes.  Pertinent labs & imaging results that were available during my care of the patient were reviewed by me and considered in my medical decision making (see chart for details).  Clinical Course as of Oct 01 1724  Thu Oct 02, 2019  1711 I have consulted and spoke to Dr. Candiss Norse with nephrology, recommends admission.  Nephrology will see patient in the ED and arrange starting dialysis tomorrow.   [CG]    Clinical Course User Index [CG] Arlean Hopping   MDM Rules/Calculators/A&P                          I obtained additional history from triage, nursing notes and review of medical chart.  Previous medical records available, nursing notes reviewed to obtain more history and assist with MDM.   In summary, admitted to hospital recently for hypoglycemia.  Nephrology has been following as OP to arrange work up for right cyst ?malignancy.  Supposed to start PD as OP.  PCP called nephrologist to arrange urgent HD, referred to ED for this.  This patient complains of worsening edema,fatigue, no appetite.    Chief complain involves an extensive number of treatment options and is a complaint that carries with it a high risk of complications and morbidity and mortality.     The differential diagnosis includes acute on chronic AKI, uremia, acidosis, other electrolyte abnormalities due to CKD.  No fever. No infectious symptoms.  HD stable.  No  respiratory distress in ER.    Laboratory studies and imaging ordered in triage by triage RN.  I have personally visualized and interpreted these.    ER work up reveals elevated creatinine 9.10, BUN 54. Hyperglycemia without AG. Normal K.  Hgb stable.  EKG unchanged.    I consulted and discussed ER work up with Dr Candiss Norse (nephrology) who will see patient, will arrange for HD likely tomorrow.    Spoke to  hospitalist who will admit patient.   Re-evaluated patient and no clinical decline.  Agrees with ER POC and admission.   Final Clinical Impression(s) / ED Diagnoses Final diagnoses:  ESRD (end stage renal disease) [facility])    Rx / DC Orders ED Discharge Orders    None       Arlean Hopping 10/02/19 1728    Carmin Muskrat, MD 10/04/19 [phone]

## 2019-10-02 NOTE — H&P (Signed)
History and Physical  Kristen English TKZ:[id number] DOB: Apr 25, 1955 DOA: 10/02/2019  PCP: Wenda Low, MD Patient coming from: PCP office  I have personally briefly reviewed patient's old medical records in [place]   Chief Complaint: Orthopnea and referred by Dr. Deforest Hoyles  HPI: Kristen English is a 64 y.o. female past medical history of essential hypertension diabetes mellitus type 2 with a last A1c of 5.5 disease stage V gout recently discharged from the hospital for hypoglycemia on 09/29/2019 as she was passing out.  Who was seen by her PCP on the day of admission as she was having orthopnea and his anasarca chec the PCP spoke to her nephrologist they both agreed to send her to the ED to start her on hemodialysis (she was scheduled to be started on peritoneal dialysis but did not to incidental renal mass found on imaging this was deferred) she relates she is having orthopnea and some dyspnea with significant exertion, she relates she continues to retain fluid and is gaining weight.  She denies any fever, cough, vomiting or diarrhea.  In the ED: R 95% on room air basic metabolic panel showed mild acidosis with a bicarb of 17, potassium of 4.6 and a creatinine of 9.1, when she was recently discharged her creatinine was about the same.   Review of Systems: All systems reviewed and apart from history of presenting illness, are negative.  Past Medical History:  Diagnosis Date  . Arthritis   . Chronic kidney disease    protein in urine  . Diabetes mellitus without complication ([place])   . Gout   . Hypertension    Past Surgical History:  Procedure Laterality Date  . COLONOSCOPY WITH PROPOFOL N/A 05/11/2014   Procedure: COLONOSCOPY WITH PROPOFOL;  Surgeon: Garlan Fair, MD;  Location: [facility];  Service: Endoscopy;  Laterality: N/A;  . FOOT SURGERY Left 2007  . JOINT REPLACEMENT  2009   bilateral knees  . TOTAL KNEE ARTHROPLASTY     bilateral   Social History:   reports that she has never smoked. She has never used smokeless tobacco. She reports that she does not drink alcohol and does not use drugs.   Allergies  Allergen Reactions  . Morphine And Related Itching    Family History  Problem Relation Age of Onset  . Heart attack Mother   . Heart attack Father     Prior to Admission medications   Medication Sig Start Date End Date Taking? Authorizing Provider  amLODipine (NORVASC) 10 MG tablet Take 10 mg by mouth at bedtime.     [provider]  aspirin 81 MG tablet Take 81 mg by mouth daily.     [provider]  atorvastatin (LIPITOR) 40 MG tablet Take 40 mg by mouth daily.  11/01/15   [provider]  carvedilol (COREG) 25 MG tablet Take 25 mg by mouth 2 (two) times daily with a meal.  02/24/13   [provider]  colchicine 0.6 MG tablet Take 0.6 mg by mouth every 12 (twelve) hours as needed (gout.).     [provider]  doxazosin (CARDURA) 2 MG tablet Take 2 mg by mouth at bedtime.  08/05/19   [provider]  ferrous sulfate (FERROUSUL) 325 (65 FE) MG tablet Take 325 mg by mouth daily with breakfast.     [provider]  furosemide (LASIX) 80 MG tablet Take 1 tablet (80 mg total) by mouth 2 (two) times daily. 09/29/19  Oswald Hillock, MD  hydrOXYzine (ATARAX/VISTARIL) 25 MG tablet Take 25 mg by mouth at bedtime as needed for itching.     [provider]  levothyroxine (SYNTHROID) 25 MCG tablet Take 25 mcg by mouth daily. 08/05/19   [provider]  liraglutide (VICTOZA) 18 MG/3ML SOPN Inject 0.3 mLs (1.8 mg total) into the skin daily. 09/29/19   Oswald Hillock, MD  NOVOFINE 32G X 6 MM MISC  08/07/14   [provider]  glipiZIDE (GLUCOTROL) 5 MG tablet Take 5 mg by mouth 3 (three) times daily with meals.  09/27/19  [provider]   Physical Exam: Vitals:   10/02/19 1221 10/02/19 1227 10/02/19 1542  BP:  (!) 165/88 (!) 151/96  Pulse:  94 91  Resp:  18  18  Temp:  98.2 F (36.8 C)   TempSrc:  Oral   SpO2:  95% 99%  Weight: (!) 138.8 kg    Height: 5' 8.5" (1.74 m)       General exam: Moderately built and nourished patient, lying comfortably supine on the gurney in no obvious distress.  Head, eyes and ENT: Nontraumatic and normocephalic. Pupils equally reacting to light and accommodation. Oral mucosa moist.  Neck: Neck is supple trachea is midline cannot appreciate any JVD  Lymphatics: No lymphadenopathy.  Respiratory system: Good air movement with crackles at the bases.  Cardiovascular system: S1 and S2 heard, RRR.  Cannot appreciate JVD  Gastrointestinal system: Abdomen is nondistended, soft and nontender. Normal bowel sounds heard. No organomegaly or masses appreciated.  Central nervous system: Alert and oriented. No focal neurological deficits.  Extremities: 3+ edema, she also has edema in her sacrum  Skin: No rashes or acute findings.  Musculoskeletal system: Negative exam.  Psychiatry: Pleasant and cooperative.   Labs on Admission:  Basic Metabolic Panel: Recent Labs  Lab 09/27/19 2016 09/27/19 2249 09/28/19 0438 09/29/19 0633 10/02/19 1233  NA 145  --  145 140 141  K 4.5  --  4.3 4.4 4.6  CL 118*  --  119* 115* 115*  CO2  --   --  17* 18* 17*  GLUCOSE 60*  --  33* 177* 133*  BUN 45*  --  42* 44* 54*  CREATININE 9.30* 8.87* 8.67* 8.72* 9.10*  CALCIUM  --   --  9.2 9.4 9.8   Liver Function Tests: Recent Labs  Lab 09/29/19 0633  AST 14*  ALT 16  ALKPHOS 63  BILITOT 0.2*  PROT 5.0*  ALBUMIN 1.9*   No results for input(s): LIPASE, AMYLASE in the last 168 hours. No results for input(s): AMMONIA in the last 168 hours. CBC: Recent Labs  Lab 09/27/19 2249 09/28/19 0438 09/28/19 1550 09/29/19 0633 10/02/19 1233  WBC 5.3 5.3  --  6.0 7.5  HGB 7.5* 6.8* 7.8* 8.3* 8.5*  HCT 26.5* 24.4* 28.0* 29.7* 30.3*  MCV 96.4 97.6  --  96.7 95.6  PLT 149* 138*  --  159 152   Cardiac Enzymes: No results for  input(s): CKTOTAL, CKMB, CKMBINDEX, TROPONINI in the last 168 hours.  BNP (last 3 results) No results for input(s): PROBNP in the last 8760 hours. CBG: Recent Labs  Lab 09/28/19 1713 09/28/19 2122 09/29/19 0110 09/29/19 0452 09/29/19 0758  GLUCAP 153* 183* 185* 160* 156*    Radiological Exams on Admission: No results found.  EKG: Independently reviewed.  None  Assessment/Plan Chronic kidney disease stage V: The patient comes in complaining of orthopnea, some dizziness and dyspnea on  exertion she has edema even in her sacrum. Likely due to fluid overload, I have spoken with nephrology and we will start dialysis on this admission. We will consult IR to place a tunneled catheter. Check renal panel to monitor for phosphorus. Nephrology has been consulted. We will place her on a renal diet.  Continue IV Lasix, strict I's and O's and daily weights. Check PTH.  Non-anion gap metabolic acidosis: Likely due to renal disease will start on bicarbonate tablets.  Normocytic anemia: Likely due to renal disease continue Aranesp and IV iron per renal.  Hypoglycemia associated with type 2 diabetes mellitus ([place]) With a last A1c of 5.4, we will not start her on any insulin check CBGs AC at at bedtime, will stop all oral hypoglycemic agents.  Recent admission was for hypoglycemia, she relates she has not experienced as severe as high hypoglycemic episodes as she was in the past but sometimes her sugars running around 50s.  Essential hypertension Blood pressure slightly elevated and she is mildly tachycardic likely due to volume overload. Continue her Norvasc, Coreg and Cardura, will start her on her current dose of Lasix to see if it helps with blood pressure.   DVT Prophylaxis: lovenox Code Status: full  Family Communication: none  Disposition Plan: Inpatient   Time spent: 70 min    It is my clinical opinion that admission to INPATIENT is reasonable and necessary in this 64 y.o.  female presenting with symptoms of orthopnea with an increase in weight and some dyspnea on exertion with a mild increase in her creatinine compared to previous admission concern about worsening renal function, she is definitely significantly fluid overloaded, will consult interventional radiology, we have already consulted renal to see if we can start dialysis tomorrow morning.  Given the aforementioned, the predictability of an adverse outcome is felt to be significant. I expect that the patient will require at least 2 midnights in the hospital to treat this condition.  Charlynne Cousins MD [facility]   10/02/2019, 5:44 PM

## 2019-10-02 NOTE — ED Triage Notes (Signed)
states she was in the in ED on sat was admitted and was discharged on Monday, went to PCP today for follow up for abnormal labs. Denies pain , knew she was going to have to start dialysis soon

## 2019-10-03 ENCOUNTER — Inpatient Hospital Stay (HOSPITAL_COMMUNITY): Payer: Medicare HMO

## 2019-10-03 DIAGNOSIS — E119 Type 2 diabetes mellitus without complications: Secondary | ICD-10-CM

## 2019-10-03 DIAGNOSIS — N185 Chronic kidney disease, stage 5: Secondary | ICD-10-CM

## 2019-10-03 DIAGNOSIS — E872 Acidosis: Secondary | ICD-10-CM

## 2019-10-03 DIAGNOSIS — E11649 Type 2 diabetes mellitus with hypoglycemia without coma: Secondary | ICD-10-CM

## 2019-10-03 HISTORY — PX: IR FLUORO GUIDE CV LINE RIGHT: IMG2283

## 2019-10-03 HISTORY — PX: IR US GUIDE VASC ACCESS RIGHT: IMG2390

## 2019-10-03 LAB — RENAL FUNCTION PANEL
Albumin: 1.8 g/dL — ABNORMAL LOW (ref 3.5–5.0)
Anion gap: 9 (ref 5–15)
BUN: 60 mg/dL — ABNORMAL HIGH (ref 8–23)
CO2: 15 mmol/L — ABNORMAL LOW (ref 22–32)
Calcium: 9.4 mg/dL (ref 8.9–10.3)
Chloride: 117 mmol/L — ABNORMAL HIGH (ref 98–111)
Creatinine, Ser: 9.4 mg/dL — ABNORMAL HIGH (ref 0.44–1.00)
GFR calc Af Amer: 5 mL/min — ABNORMAL LOW (ref >60)
GFR calc non Af Amer: 4 mL/min — ABNORMAL LOW (ref >60)
Glucose, Bld: 102 mg/dL — ABNORMAL HIGH (ref 70–99)
Phosphorus: 9.8 mg/dL — ABNORMAL HIGH (ref 2.5–4.6)
Potassium: 4.8 mmol/L (ref 3.5–5.1)
Sodium: 141 mmol/L (ref 135–145)

## 2019-10-03 LAB — GLUCOSE, CAPILLARY
Glucose-Capillary: 93 mg/dL (ref 70–99)
Glucose-Capillary: 91 mg/dL (ref 70–99)
Glucose-Capillary: 97 mg/dL (ref 70–99)
Glucose-Capillary: 146 mg/dL — ABNORMAL HIGH (ref 70–99)

## 2019-10-03 LAB — CBC
HCT: 26.2 % — ABNORMAL LOW (ref 36.0–46.0)
Hemoglobin: 7.2 g/dL — ABNORMAL LOW (ref 12.0–15.0)
MCH: 26.7 pg (ref 26.0–34.0)
MCHC: 27.5 g/dL — ABNORMAL LOW (ref 30.0–36.0)
MCV: 97 fL (ref 80.0–100.0)
Platelets: 131 10*3/uL — ABNORMAL LOW (ref 150–400)
RBC: 2.7 MIL/uL — ABNORMAL LOW (ref 3.87–5.11)
RDW: 16.5 % — ABNORMAL HIGH (ref 11.5–15.5)
WBC: 6.8 10*3/uL (ref 4.0–10.5)
nRBC: 0 % (ref 0.0–0.2)

## 2019-10-03 MED ORDER — FENTANYL CITRATE (PF) 100 MCG/2ML IJ SOLN
INTRAMUSCULAR | Status: AC | PRN
  Administered 2019-10-03: 25 ug via INTRAVENOUS

## 2019-10-03 MED ORDER — LIDOCAINE HCL 1 % IJ SOLN
INTRAMUSCULAR | Status: AC
  Filled 2019-10-03: qty 20

## 2019-10-03 MED ORDER — CEFAZOLIN SODIUM-DEXTROSE 2-4 GM/100ML-% IV SOLN
INTRAVENOUS | Status: AC
  Filled 2019-10-03: qty 100

## 2019-10-03 MED ORDER — FENTANYL CITRATE (PF) 100 MCG/2ML IJ SOLN
INTRAMUSCULAR | Status: AC
  Filled 2019-10-03: qty 2

## 2019-10-03 MED ORDER — CHLORHEXIDINE GLUCONATE CLOTH 2 % EX PADS
6.0000 | MEDICATED_PAD | Freq: Every day | CUTANEOUS | Status: DC
  Administered 2019-10-03 – 2019-10-07 (×3): 6 via TOPICAL

## 2019-10-03 MED ORDER — HEPARIN SODIUM (PORCINE) 1000 UNIT/ML IJ SOLN
INTRAMUSCULAR | Status: AC
  Filled 2019-10-03: qty 1

## 2019-10-03 MED ORDER — GELATIN ABSORBABLE 12-7 MM EX MISC
CUTANEOUS | Status: AC
  Filled 2019-10-03: qty 1

## 2019-10-03 MED ORDER — CEFAZOLIN SODIUM-DEXTROSE 2-4 GM/100ML-% IV SOLN
2.0000 g | Freq: Once | INTRAVENOUS | Status: AC
  Administered 2019-10-03: 2 g via INTRAVENOUS
  Filled 2019-10-03: qty 100

## 2019-10-03 MED ORDER — LIDOCAINE HCL 1 % IJ SOLN
INTRAMUSCULAR | Status: AC | PRN
  Administered 2019-10-03: 10 mL

## 2019-10-03 MED ORDER — MIDAZOLAM HCL 2 MG/2ML IJ SOLN
INTRAMUSCULAR | Status: AC
  Filled 2019-10-03: qty 2

## 2019-10-03 NOTE — Progress Notes (Signed)
Patient ID: Kristen English, female   DOB: [birth year], 64 y.o.   MRN: [id number]  PROGRESS NOTE    Kristen English  GHW:[id number] DOB: 1955/08/03 DOA: 10/02/2019 PCP: Wenda Low, MD   Brief Narrative:  64 year old female with history of essential hypertension, diabetes mellitus type 2, chronic kidney disease stage V who was supposed to be started on peritoneal dialysis but was deferred because of incidental renal mass found on imaging, gout, recent discharge from hospital on 09/29/2019 for hypoglycemia was seen by PCP on 10/02/2019 for orthopnea and anasarca and was sent to the hospital for admission for possible initiation of hemodialysis.  In the ED, bicarb was 17 and creatinine was 9.1 which was almost the same from her recent creatinine on discharge.  Nephrology was consulted.  Assessment & Plan:   Chronic any disease stage V Worsening orthopnea and anasarca/fluid overload Non-anion gap metabolic acidosis -Nephrology following.  IR has been consulted for hemodialysis catheter placement.  Will change to n.p.o. for the same.  Patient will probably have dialysis afterwards. -Strict input and output.  Daily weights.  Continue oral Lasix and oral sodium bicarbonate for now.  Diabetes mellitus type 2 with recent episodes of hypoglycemia -Last A1c 5.4.  Continue CBGs with SSI.  Patient was recently hospitalized for episodes of hypoglycemia  Essential hypertension -Blood pressure on the higher side.  Monitor.  Continue amlodipine, Coreg, doxazosin and Lasix.  Hypothyroidism-continue levothyroxine  Morbid obesity -Outpatient follow-up    DVT prophylaxis: Heparin Code Status: Full Family Communication: Spoke to patient at bedside  disposition Plan: Status is: Inpatient  Remains inpatient appropriate because:Inpatient level of care appropriate due to severity of illness   Dispo: The patient is from: Home              Anticipated d/c is to: Home              Anticipated d/c date  is: > 3 days              Patient currently is not medically stable to d/c.  Consultants: Nephrology/IR  Procedures: None  Antimicrobials: None   Subjective: Patient seen and examined at bedside.  Denies any current chest pain.  Feels short of breath with minimal exertion.  No overnight fever, vomiting reported.  Objective: Vitals:   10/02/19 1916 10/02/19 2009 10/03/19 0204 10/03/19 0528  BP: (!) 187/85 (!) 160/86 134/69 (!) 162/63  Pulse: 100 95 90 96  Resp: (!) 22 19 17 19   Temp:  98 F (36.7 C) 98.5 F (36.9 C) 98.4 F (36.9 C)  TempSrc:  Oral Oral Oral  SpO2: 94% 98% 95% 92%  Weight:      Height:        Intake/Output Summary (Last 24 hours) at 10/03/2019 0930 Last data filed at 10/03/2019 7893 Gross per 24 hour  Intake 350 ml  Output --  Net 350 ml   Filed Weights   10/02/19 1221  Weight: (!) 138.8 kg    Examination:  General exam: Appears calm and comfortable.  Looks older than stated age. Respiratory system: Bilateral decreased breath sounds at bases with scattered crackles Cardiovascular system: S1 & S2 heard, Rate controlled Gastrointestinal system: Abdomen is morbidly obese, nondistended, soft and nontender. Normal bowel sounds heard. Extremities: No cyanosis, clubbing; bilateral lower extremity edema present Central nervous system: Alert and oriented. No focal neurological deficits. Moving extremities Skin: No rashes, lesions or ulcers Psychiatry: Judgement and insight appear normal. Mood & affect appropriate.  Data Reviewed: I have personally reviewed following labs and imaging studies  CBC: Recent Labs  Lab 09/27/19 2249 09/27/19 2249 09/28/19 0438 09/28/19 1550 09/29/19 0633 10/02/19 1233 10/03/19 0153  WBC 5.3  --  5.3  --  6.0 7.5 6.8  HGB 7.5*   < > 6.8* 7.8* 8.3* 8.5* 7.2*  HCT 26.5*   < > 24.4* 28.0* 29.7* 30.3* 26.2*  MCV 96.4  --  97.6  --  96.7 95.6 97.0  PLT 149*  --  138*  --  159 152 131*   < > = values in this interval  not displayed.   Basic Metabolic Panel: Recent Labs  Lab 09/27/19 2016 09/27/19 2016 09/27/19 2249 09/28/19 0438 09/29/19 0633 10/02/19 1233 10/03/19 0153  NA 145  --   --  145 140 141 141  K 4.5  --   --  4.3 4.4 4.6 4.8  CL 118*  --   --  119* 115* 115* 117*  CO2  --   --   --  17* 18* 17* 15*  GLUCOSE 60*  --   --  33* 177* 133* 102*  BUN 45*  --   --  42* 44* 54* 60*  CREATININE 9.30*   < > 8.87* 8.67* 8.72* 9.10* 9.40*  CALCIUM  --   --   --  9.2 9.4 9.8 9.4  PHOS  --   --   --   --   --   --  9.8*   < > = values in this interval not displayed.   GFR: Estimated Creatinine Clearance: 8.9 mL/min (A) (by C-G formula based on SCr of 9.4 mg/dL (H)). Liver Function Tests: Recent Labs  Lab 09/29/19 0633 10/03/19 0153  AST 14*  --   ALT 16  --   ALKPHOS 63  --   BILITOT 0.2*  --   PROT 5.0*  --   ALBUMIN 1.9* 1.8*   No results for input(s): LIPASE, AMYLASE in the last 168 hours. No results for input(s): AMMONIA in the last 168 hours. Coagulation Profile: No results for input(s): INR, PROTIME in the last 168 hours. Cardiac Enzymes: No results for input(s): CKTOTAL, CKMB, CKMBINDEX, TROPONINI in the last 168 hours. BNP (last 3 results) No results for input(s): PROBNP in the last 8760 hours. HbA1C: No results for input(s): HGBA1C in the last 72 hours. CBG: Recent Labs  Lab 09/29/19 0110 09/29/19 0452 09/29/19 0758 10/02/19 2120 10/03/19 0729  GLUCAP 185* 160* 156* 165* 93   Lipid Profile: No results for input(s): CHOL, HDL, LDLCALC, TRIG, CHOLHDL, LDLDIRECT in the last 72 hours. Thyroid Function Tests: No results for input(s): TSH, T4TOTAL, FREET4, T3FREE, THYROIDAB in the last 72 hours. Anemia Panel: No results for input(s): VITAMINB12, FOLATE, FERRITIN, TIBC, IRON, RETICCTPCT in the last 72 hours. Sepsis Labs: No results for input(s): PROCALCITON, LATICACIDVEN in the last 168 hours.  Recent Results (from the past 240 hour(s))  SARS Coronavirus 2 by RT  PCR (hospital order, performed in [facility] hospital lab) Nasopharyngeal Nasopharyngeal Swab     Status: None   Collection Time: 09/27/19  9:45 PM   Specimen: Nasopharyngeal Swab  Result Value Ref Range Status   SARS Coronavirus 2 NEGATIVE NEGATIVE Final    Comment: (NOTE) SARS-CoV-2 target nucleic acids are NOT DETECTED.  The SARS-CoV-2 RNA is generally detectable in upper and lower respiratory specimens during the acute phase of infection. The lowest concentration of SARS-CoV-2 viral copies this assay can detect is 250  copies / mL. A negative result does not preclude SARS-CoV-2 infection and should not be used as the sole basis for treatment or other patient management decisions.  A negative result may occur with improper specimen collection / handling, submission of specimen other than nasopharyngeal swab, presence of viral mutation(s) within the areas targeted by this assay, and inadequate number of viral copies (<250 copies / mL). A negative result must be combined with clinical observations, patient history, and epidemiological information.  Fact Sheet for Patients:   StrictlyIdeas.no  Fact Sheet for Healthcare Providers: BankingDealers.co.za  This test is not yet approved or  cleared by the [place] FDA and has been authorized for detection and/or diagnosis of SARS-CoV-2 by FDA under an Emergency Use Authorization (EUA).  This EUA will remain in effect (meaning this test can be used) for the duration of the COVID-19 declaration under Section 564(b)(1) of the Act, 21 U.S.C. section 360bbb-3(b)(1), unless the authorization is terminated or revoked sooner.  Performed at [facility], [place] [address], [place], [place] [address]          Radiology Studies: No results found.      Scheduled Meds: . amLODipine  10 mg Oral QHS  . aspirin EC  81 mg Oral Daily  . atorvastatin  40 mg Oral Daily  . carvedilol   25 mg Oral BID WC  . doxazosin  2 mg Oral QHS  . furosemide  80 mg Oral BID  . heparin injection (subcutaneous)  5,000 Units Subcutaneous Q8H  . levothyroxine  25 mcg Oral Daily  . sodium bicarbonate  650 mg Oral BID  . sodium chloride flush  3 mL Intravenous Once  . sodium chloride flush  3 mL Intravenous Q12H   Continuous Infusions: . sodium chloride            Aline August, MD [facility] 10/03/2019, 9:30 AM

## 2019-10-03 NOTE — Consult Note (Signed)
Chief Complaint: ESRD in fluid overload. Request is for tunneled HD catheter  Referring Physician(s): Dr. Alfonso Ellis  Supervising Physician: Corrie Mckusick  Patient Status: [facility] - In-pt  History of Present Illness: Kristen English is a 64 y.o. female History of HTN, DM, CKD, ESRD Patient was scheduled for a PD catheter placement however procedure was delayed when a mass was found on her right kidney during work up for possible kidney transplant.  Patient presented to the emergency department in fluid overload with [facility] worse upon exertion. IR previously performed a bone marrow biopsy on 8.29.2018 for further determination of monoclonal gammopathy. Team is requesting Tunneled HD catheter for dialysis access. Per Patient she has no prior line history. Patient endorses fatigue but denies SHOB at this time. Patient is being followed for right renal mass.    Past Medical History:  Diagnosis Date  . Arthritis   . Chronic kidney disease    protein in urine  . Diabetes mellitus without complication ([place])   . Gout   . Hypertension     Past Surgical History:  Procedure Laterality Date  . COLONOSCOPY WITH PROPOFOL N/A 05/11/2014   Procedure: COLONOSCOPY WITH PROPOFOL;  Surgeon: Garlan Fair, MD;  Location: [facility];  Service: Endoscopy;  Laterality: N/A;  . FOOT SURGERY Left 2007  . JOINT REPLACEMENT  2009   bilateral knees  . TOTAL KNEE ARTHROPLASTY     bilateral    Allergies: Morphine and related  Medications: Prior to Admission medications   Medication Sig Start Date End Date Taking? Authorizing Provider  amLODipine (NORVASC) 10 MG tablet Take 10 mg by mouth at bedtime.    Yes [provider]  aspirin 81 MG tablet Take 81 mg by mouth daily.    Yes [provider]  atorvastatin (LIPITOR) 40 MG tablet Take 40 mg by mouth daily.  11/01/15  Yes [provider]  carvedilol (COREG) 25 MG tablet Take 25 mg by mouth 2 (two) times daily with a meal.   02/24/13  Yes [provider]  colchicine 0.6 MG tablet Take 0.6 mg by mouth every 12 (twelve) hours as needed (gout.).    Yes [provider]  doxazosin (CARDURA) 2 MG tablet Take 2 mg by mouth at bedtime.  08/05/19  Yes [provider]  ferrous sulfate (FERROUSUL) 325 (65 FE) MG tablet Take 325 mg by mouth daily with breakfast.    Yes [provider]  levothyroxine (SYNTHROID) 25 MCG tablet Take 25 mcg by mouth daily. 08/05/19  Yes [provider]  furosemide (LASIX) 80 MG tablet Take 1 tablet (80 mg total) by mouth 2 (two) times daily. Patient not taking: Reported on 10/02/2019 09/29/19   Oswald Hillock, MD  liraglutide (VICTOZA) 18 MG/3ML SOPN Inject 0.3 mLs (1.8 mg total) into the skin daily. Patient not taking: Reported on 10/02/2019 09/29/19   Oswald Hillock, MD  glipiZIDE (GLUCOTROL) 5 MG tablet Take 5 mg by mouth 3 (three) times daily with meals.  09/27/19  [provider]     Family History  Problem Relation Age of Onset  . Heart attack Mother   . Heart attack Father     Social History   Socioeconomic History  . Marital status: Divorced    Spouse name: Not on file  . Number of children: Not on file  . Years of education: Not on file  . Highest education level: Not on file  Occupational History  . Not on file  Tobacco Use  . Smoking status: Never Smoker  . Smokeless tobacco: Never Used  Substance and Sexual Activity  . Alcohol use: No  . Drug use: No  . Sexual activity: Not Currently  Other Topics Concern  . Not on file  Social History Narrative  . Not on file   Social Determinants of Health   Financial Resource Strain:   . Difficulty of Paying Living Expenses:   Food Insecurity:   . Worried About Charity fundraiser in the Last Year:   . Arboriculturist in the Last Year:   Transportation Needs:   . Film/video editor (Medical):   Marland Kitchen Lack of Transportation (Non-Medical):   Physical Activity:   . Days of  Exercise per Week:   . Minutes of Exercise per Session:   Stress:   . Feeling of Stress :   Social Connections:   . Frequency of Communication with Friends and Family:   . Frequency of Social Gatherings with Friends and Family:   . Attends Religious Services:   . Active Member of Clubs or Organizations:   . Attends Archivist Meetings:   Marland Kitchen Marital Status:     Review of Systems: A 12 point ROS discussed and pertinent positives are indicated in the HPI above.  All other systems are negative.  Review of Systems  Constitutional: Positive for fatigue. Negative for fever.  HENT: Negative for congestion.   Respiratory: Negative for cough and shortness of breath.   Gastrointestinal: Negative for abdominal pain, diarrhea, nausea and vomiting.    Vital Signs: BP (!) 162/63 (BP Location: Left Arm)   Pulse 96   Temp 98.4 F (36.9 C) (Oral)   Resp 19   Ht 5' 8.5" (1.74 m)   Wt (!) 306 lb (138.8 kg)   SpO2 92%   BMI 45.85 kg/m   Physical Exam Vitals and nursing note reviewed.  Constitutional:      Appearance: She is well-developed.  HENT:     Head: Normocephalic and atraumatic.  Eyes:     Conjunctiva/sclera: Conjunctivae normal.  Cardiovascular:     Rate and Rhythm: Normal rate and regular rhythm.     Heart sounds: Normal heart sounds.  Pulmonary:     Effort: Pulmonary effort is normal.     Breath sounds: Normal breath sounds.  Musculoskeletal:        General: Normal range of motion.     Cervical back: Normal range of motion.  Skin:    General: Skin is warm.  Neurological:     Mental Status: She is alert and oriented to person, place, and time.     Imaging: No results found.  Labs:  CBC: Recent Labs    09/28/19 0438 09/28/19 0438 09/28/19 1550 09/29/19 0633 10/02/19 1233 10/03/19 0153  WBC 5.3  --   --  6.0 7.5 6.8  HGB 6.8*   < > 7.8* 8.3* 8.5* 7.2*  HCT 24.4*   < > 28.0* 29.7* 30.3* 26.2*  PLT 138*  --   --  159 152 131*   < > = values in this  interval not displayed.    COAGS: No results for input(s): INR, APTT in the last 8760 hours.  BMP: Recent Labs    09/28/19 0438 09/29/19 0633 10/02/19 1233 10/03/19 0153  NA 145 140 141 141  K 4.3 4.4 4.6 4.8  CL 119* 115* 115* 117*  CO2 17* 18* 17* 15*  GLUCOSE 33* 177* 133* 102*  BUN 42* 44* 54* 60*  CALCIUM 9.2 9.4 9.8 9.4  CREATININE 8.67* 8.72* 9.10* 9.40*  GFRNONAA 4* 4* 4* 4*  GFRAA 5* 5* 5* 5*    LIVER FUNCTION TESTS: Recent Labs    07/31/19 0853 09/29/19 0633 10/03/19 0153  BILITOT  --  0.2*  --   AST  --  14*  --   ALT  --  16  --   ALKPHOS  --  63  --   PROT  --  5.0*  --   ALBUMIN 2.4* 1.9* 1.8*    TUMOR MARKERS: No results for input(s): AFPTM, CEA, CA199, CHROMGRNA in the last 8760 hours.  Assessment and Plan: 64 y.o, female inpatient. History of HTN, DM, CKD, ESRD Patient was scheduled for a PD catheter placement however procedure was delayed when a mass was found on her right kidney during work up for possible kidney transplant.  Patient presented to the emergency department in fluid overload with [facility] worse upon exertion. IR previously performed a bone marrow biopsy on 8.29.2018 for further determination of monoclonal gammopathy. Team is requesting Tunneled HD catheter for dialysis access. Per Patient she has no prior line history. Patient endorses fatigue but denies SHOB at this time. Patient is being followed for right renal mass.     BUN 60, Cr 9.4 All labs and medications are within acceptable parameters. Patient is afebrile.  Risks and benefits discussed with the patient including, but not limited to bleeding, infection, vascular injury, pneumothorax which may require chest tube placement, air embolism or even death  All of the patient's questions were answered, patient is agreeable to proceed. Consent signed and in chart.   Thank you for this interesting consult.  I greatly enjoyed meeting Kristen English and look forward to participating  in their care.  A copy of this report was sent to the requesting provider on this date.  Electronically Signed: Jacqualine Mau, NP 10/03/2019, 1:15 PM   I spent a total of 40 Minutes    in face to face in clinical consultation, greater than 50% of which was counseling/coordinating care for tunneled HD catheter placement

## 2019-10-03 NOTE — Progress Notes (Signed)
Consult received for PIV placement. Spoke with Nolene Ebbs, at this time no infusions or tests ordered at this time that require PIV. Instructed on importance of vein preservation. VU. Fran Lowes, RN VAST

## 2019-10-03 NOTE — Procedures (Signed)
Interventional Radiology Procedure Note  Procedure: Placement of a right IJ approach tunneled HD catheter 19cm tip to cuff.  Tip is positioned at the superior cavoatrial junction and catheter is ready for immediate use.  Complications: None Recommendations:  - Ok to use - Do not submerge - Routine line care   Signed,  Holliday Sheaffer S. Laysha Childers, DO    

## 2019-10-03 NOTE — Plan of Care (Signed)
  Problem: Clinical Measurements: Goal: Ability to maintain clinical measurements within normal limits will improve Outcome: Progressing   Problem: Nutrition: Goal: Adequate nutrition will be maintained Outcome: Progressing   Problem: Pain Managment: Goal: General experience of comfort will improve Outcome: Progressing   

## 2019-10-03 NOTE — Progress Notes (Signed)
Renal Navigator attempted to meet with patient to discuss OP HD referral for ESRD, however, she was in procedure for [facility] at this time. Her daughter was in her room and introduced herself as Sandrea Hammond. She states she is here visiting from [place] until Tuesday. She asks that her mother be referred to clinic closest to her home and also thinks that patient should have "[facility]" (now [facility]) arranged for transportation. She states her mother gets around fairly well with just a cane, but that she has had quite a bit of swelling in her legs over the last 2 weeks per conversations with her. Patient's daughter reports that her mother follows with a Nephrologist in [facility] with [place], though notes state she follows with Dr. Moshe Cipro at [facility]. Patient's daughter states that it is most important for her mother to be close to home for HD and it won't matter if she needs to change practices if she is still with an MD in [place]. Navigator submitted referral to [facility], though will confirm all with patient once available. The closest clinic to patient's home is [place], though Navigator is unsure if this clinic has a seat available at this time or not-Navigator inquiring. Patient has not yet had her first HD treatment, so HepB surface antigen will need to be submitted once resulted after first treatment in order for referral to be complete. Navigator will follow up Monday, 10/06/19. Navigator will completed [facility] application with patient at that time as available.  Alphonzo Cruise, [place] Renal Navigator [phone]

## 2019-10-03 NOTE — Consult Note (Addendum)
Hospital Consult    Reason for Consult:  In need of permanent HD access Requesting Physician:  Joelyn Oms MRN #:  [id number]  History of Present Illness: This is a 64 y.o. female who presented to ED yesterday.  She has a right renal mass and needs to start HD.  She is pt of Dr. Moshe Cipro and was planning to start PD, however, given she has potential to have [place] with potential need for nephrectomy, that is on hold.  She was recently discharged from the hospital for hypoglycemia.  She has had swelling in her legs, nausea and loss of appetite.  She is admitted for uremia and need for HD.    She is scheduled for [facility] today with IR and [facility]VS is consulted for permanent access.   She is right hand dominant.  The pt is on a statin for cholesterol management.  The pt is on a daily aspirin.   Other AC:  none The pt is on CCB, BB for hypertension.   The pt is not diabetic.   Tobacco hx:  Never  Of note, the "d" in her last name is silent and pronounced "Noye".  It is a [place] African name.   Past Medical History:  Diagnosis Date  . Arthritis   . Chronic kidney disease    protein in urine  . Diabetes mellitus without complication ([place])   . Gout   . Hypertension     Past Surgical History:  Procedure Laterality Date  . COLONOSCOPY WITH PROPOFOL N/A 05/11/2014   Procedure: COLONOSCOPY WITH PROPOFOL;  Surgeon: Garlan Fair, MD;  Location: [facility];  Service: Endoscopy;  Laterality: N/A;  . FOOT SURGERY Left 2007  . JOINT REPLACEMENT  2009   bilateral knees  . TOTAL KNEE ARTHROPLASTY     bilateral    Allergies  Allergen Reactions  . Morphine And Related Itching    Prior to Admission medications   Medication Sig Start Date End Date Taking? Authorizing Provider  amLODipine (NORVASC) 10 MG tablet Take 10 mg by mouth at bedtime.    Yes [provider]  aspirin 81 MG tablet Take 81 mg by mouth daily.    Yes [provider]  atorvastatin (LIPITOR) 40 MG tablet Take  40 mg by mouth daily.  11/01/15  Yes [provider]  carvedilol (COREG) 25 MG tablet Take 25 mg by mouth 2 (two) times daily with a meal.  02/24/13  Yes [provider]  colchicine 0.6 MG tablet Take 0.6 mg by mouth every 12 (twelve) hours as needed (gout.).    Yes [provider]  doxazosin (CARDURA) 2 MG tablet Take 2 mg by mouth at bedtime.  08/05/19  Yes [provider]  ferrous sulfate (FERROUSUL) 325 (65 FE) MG tablet Take 325 mg by mouth daily with breakfast.    Yes [provider]  levothyroxine (SYNTHROID) 25 MCG tablet Take 25 mcg by mouth daily. 08/05/19  Yes [provider]  furosemide (LASIX) 80 MG tablet Take 1 tablet (80 mg total) by mouth 2 (two) times daily. Patient not taking: Reported on 10/02/2019 09/29/19   Oswald Hillock, MD  liraglutide (VICTOZA) 18 MG/3ML SOPN Inject 0.3 mLs (1.8 mg total) into the skin daily. Patient not taking: Reported on 10/02/2019 09/29/19   Oswald Hillock, MD  glipiZIDE (GLUCOTROL) 5 MG tablet Take 5 mg by mouth 3 (three) times daily with meals.  09/27/19  [provider]    Social History   Socioeconomic  History  . Marital status: Divorced    Spouse name: Not on file  . Number of children: Not on file  . Years of education: Not on file  . Highest education level: Not on file  Occupational History  . Not on file  Tobacco Use  . Smoking status: Never Smoker  . Smokeless tobacco: Never Used  Substance and Sexual Activity  . Alcohol use: No  . Drug use: No  . Sexual activity: Not Currently  Other Topics Concern  . Not on file  Social History Narrative  . Not on file   Social Determinants of Health   Financial Resource Strain:   . Difficulty of Paying Living Expenses:   Food Insecurity:   . Worried About Charity fundraiser in the Last Year:   . Arboriculturist in the Last Year:   Transportation Needs:   . Film/video editor (Medical):   Marland Kitchen Lack of Transportation  (Non-Medical):   Physical Activity:   . Days of Exercise per Week:   . Minutes of Exercise per Session:   Stress:   . Feeling of Stress :   Social Connections:   . Frequency of Communication with Friends and Family:   . Frequency of Social Gatherings with Friends and Family:   . Attends Religious Services:   . Active Member of Clubs or Organizations:   . Attends Archivist Meetings:   Marland Kitchen Marital Status:   Intimate Partner Violence:   . Fear of Current or Ex-Partner:   . Emotionally Abused:   Marland Kitchen Physically Abused:   . Sexually Abused:     Family History  Problem Relation Age of Onset  . Heart attack Mother   . Heart attack Father     ROS: [x]  Positive   [ ]  Negative   [ ]  All sytems reviewed and are negative  Cardiac: [x]  high blood pressure  Vascular: []  pain in legs while walking []  non-healing ulcers [x]  swelling in legs  Pulmonary: []  productive cough []  asthma/wheezing []  home O2  Neurologic: []  hx of CVA []  mini stroke  Hematologic: [x]  hx of cancer-renal mass ? RCC  Endocrine:   [x]  diabetes [x]  thyroid disease  GI []  vomiting blood []  blood in stool  GU: [x]  CKD/renal failure [x]  HD-new  Psychiatric: []  anxiety []  depression  Musculoskeletal: [x]  gout [x]  arthritis [x]  hx total knee replacement bilaterally and left foot reconstruction.  Integumentary: []  rashes []  ulcers  Constitutional: []  fever []  chills   Physical Examination  Vitals:   10/03/19 0204 10/03/19 0528  BP: 134/69 (!) 162/63  Pulse: 90 96  Resp: 17 19  Temp: 98.5 F (36.9 C) 98.4 F (36.9 C)  SpO2: 95% 92%   Body mass index is 45.85 kg/m.  General:  WDWN in NAD Gait: Not observed HENT: WNL, normocephalic Pulmonary: normal non-labored breathing, without Rales, rhonchi,  wheezing Cardiac: regular, with Murmur without carotid bruits Abdomen:  soft, obese, NT/ND, no masses Skin: without rashes Vascular Exam/Pulses:  Right Left  Radial 2+  (normal) 1+ (weak)  Ulnar Unable to palpate  Unable to palpate  Popliteal Unable to palpate Unable to palpate   AT 1+ (weak) 2+ (normal)  PT Unable to palpate Unable to palpate    Extremities: without ischemic changes, without Gangrene , without cellulitis; without open wounds;  Musculoskeletal: no muscle wasting or atrophy  Neurologic: A&O X 3;  No focal weakness or paresthesias are detected; speech is fluent/normal Psychiatric:  The  pt has Normal affect.   CBC    Component Value Date/Time   WBC 6.8 10/03/2019 0153   RBC 2.70 (L) 10/03/2019 0153   HGB 7.2 (L) 10/03/2019 0153   HGB 9.6 (L) 08/29/2018 1112   HGB 9.9 (L) 02/14/2017 1100   HCT 26.2 (L) 10/03/2019 0153   HCT 31.7 (L) 02/14/2017 1100   PLT 131 (L) 10/03/2019 0153   PLT 173 08/29/2018 1112   PLT 195 02/14/2017 1100   MCV 97.0 10/03/2019 0153   MCV 86.3 02/14/2017 1100   MCH 26.7 10/03/2019 0153   MCHC 27.5 (L) 10/03/2019 0153   RDW 16.5 (H) 10/03/2019 0153   RDW 15.6 (H) 02/14/2017 1100   LYMPHSABS 1.0 08/29/2018 1112   LYMPHSABS 1.0 02/14/2017 1100   MONOABS 0.3 08/29/2018 1112   MONOABS 0.3 02/14/2017 1100   EOSABS 0.2 08/29/2018 1112   EOSABS 0.1 02/14/2017 1100   BASOSABS 0.0 08/29/2018 1112   BASOSABS 0.0 02/14/2017 1100    BMET    Component Value Date/Time   NA 141 10/03/2019 0153   NA 142 02/14/2017 1100   K 4.8 10/03/2019 0153   K 4.4 02/14/2017 1100   CL 117 (H) 10/03/2019 0153   CO2 15 (L) 10/03/2019 0153   CO2 24 02/14/2017 1100   GLUCOSE 102 (H) 10/03/2019 0153   GLUCOSE 95 02/14/2017 1100   BUN 60 (H) 10/03/2019 0153   BUN 43.2 (H) 02/14/2017 1100   CREATININE 9.40 (H) 10/03/2019 0153   CREATININE 4.29 (HH) 08/29/2018 1112   CREATININE 4.2 (HH) 02/14/2017 1100   CALCIUM 9.4 10/03/2019 0153   CALCIUM 9.7 07/31/2019 0853   CALCIUM 9.9 02/14/2017 1100   GFRNONAA 4 (L) 10/03/2019 0153   GFRNONAA 10 (L) 08/29/2018 1112   GFRAA 5 (L) 10/03/2019 0153   GFRAA 12 (L) 08/29/2018 1112     COAGS: Lab Results  Component Value Date   INR 0.95 11/15/2016   INR 1.06 12/31/2013   INR 0.90 12/12/2012     Non-Invasive Vascular Imaging:   ordered   ASSESSMENT/PLAN: This is a 64 y.o. female with CKD 5 now with ESRD needing HD and in need of access  -pt is right hand dominant-restrict left hand.  -TDC placement today with IR -BUE vein mapping is pending.  Will determine access once this is completed and will plan for next week most likely.  -Discussed fistula vs graft as well as steal sx.     Leontine Locket, PA-C [facility] [phone]  Agree with above.  Left side access probably early next week will review vein map.  Ruta Hinds, MD [facility] [place] Office: [phone]

## 2019-10-03 NOTE — Progress Notes (Signed)
Admit: 10/02/2019 LOS: 1  27F new start HD, uremia  Subjective:  . No events overnight . For [facility] with IR today . Plan was for PD but has renal mass needing eval . Discussed role of AVF   07/15 0701 - 07/16 0700 In: 470 [P.O.:470] Out: -   Filed Weights   10/02/19 1221  Weight: (!) 138.8 kg    Scheduled Meds: . amLODipine  10 mg Oral QHS  . aspirin EC  81 mg Oral Daily  . atorvastatin  40 mg Oral Daily  . carvedilol  25 mg Oral BID WC  . Chlorhexidine Gluconate Cloth  6 each Topical Q0600  . doxazosin  2 mg Oral QHS  . furosemide  80 mg Oral BID  . heparin injection (subcutaneous)  5,000 Units Subcutaneous Q8H  . levothyroxine  25 mcg Oral Daily  . sodium bicarbonate  650 mg Oral BID  . sodium chloride flush  3 mL Intravenous Once  . sodium chloride flush  3 mL Intravenous Q12H   Continuous Infusions: . sodium chloride     PRN Meds:.sodium chloride, acetaminophen **OR** acetaminophen, ondansetron **OR** ondansetron (ZOFRAN) IV, polyethylene glycol, sodium chloride flush  Current Labs: reviewed    Physical Exam:  Blood pressure (!) 162/63, pulse 96, temperature 98.4 F (36.9 C), temperature source Oral, resp. rate 19, height 5' 8.5" (1.74 m), weight (!) 138.8 kg, SpO2 92 %. NAD RRR with 2/6 MSM, no rub CTAB 2-3+ LEE b/l Nonfocal S/nt/nd EOMI  A 1. New ESRD with uremia; plan was for PD but put on hold 2/2 #2; needs to start HD needs vascular access 2. R Renal mass, seeing urology 3. DM2 4. Anemia, Hb 7s 5. Gout  6. HTN 7. NAGMA 2/2 #1 8. CKD-BMD, hyperphosphatemia  P . TDC today with IR . Discussed AVF, given uncertainty of if/when will be on PD, recommended she rec and she agrees. VVS consulted, vein mapping ordered . HD today post TDC: 2K, 2.5h, 300/500, 1L UF, no heparin . With problem #2, hold ESA For now, check Fe levels . Trend P with starting HD, check PTH . Medication Issues; o Preferred narcotic agents for pain control are hydromorphone,  fentanyl, and methadone. Morphine should not be used.  o Baclofen should be avoided o Avoid oral sodium phosphate and magnesium citrate based laxatives / bowel preps    Pearson Grippe MD 10/03/2019, 10:08 AM  Recent Labs  Lab 09/29/19 0633 10/02/19 1233 10/03/19 0153  NA 140 141 141  K 4.4 4.6 4.8  CL 115* 115* 117*  CO2 18* 17* 15*  GLUCOSE 177* 133* 102*  BUN 44* 54* 60*  CREATININE 8.72* 9.10* 9.40*  CALCIUM 9.4 9.8 9.4  PHOS  --   --  9.8*   Recent Labs  Lab 09/29/19 0633 10/02/19 1233 10/03/19 0153  WBC 6.0 7.5 6.8  HGB 8.3* 8.5* 7.2*  HCT 29.7* 30.3* 26.2*  MCV 96.7 95.6 97.0  PLT 159 152 131*

## 2019-10-04 ENCOUNTER — Inpatient Hospital Stay (HOSPITAL_COMMUNITY): Payer: Medicare HMO

## 2019-10-04 DIAGNOSIS — R601 Generalized edema: Secondary | ICD-10-CM

## 2019-10-04 DIAGNOSIS — N186 End stage renal disease: Secondary | ICD-10-CM

## 2019-10-04 DIAGNOSIS — D649 Anemia, unspecified: Secondary | ICD-10-CM

## 2019-10-04 LAB — CBC WITH DIFFERENTIAL/PLATELET
Abs Immature Granulocytes: 0.05 10*3/uL (ref 0.00–0.07)
Basophils Absolute: 0 10*3/uL (ref 0.0–0.1)
Basophils Relative: 0 %
Eosinophils Absolute: 0.3 10*3/uL (ref 0.0–0.5)
Eosinophils Relative: 5 %
HCT: 25 % — ABNORMAL LOW (ref 36.0–46.0)
Hemoglobin: 7.2 g/dL — ABNORMAL LOW (ref 12.0–15.0)
Immature Granulocytes: 1 %
Lymphocytes Relative: 12 %
Lymphs Abs: 0.7 10*3/uL (ref 0.7–4.0)
MCH: 27 pg (ref 26.0–34.0)
MCHC: 28.8 g/dL — ABNORMAL LOW (ref 30.0–36.0)
MCV: 93.6 fL (ref 80.0–100.0)
Monocytes Absolute: 0.6 10*3/uL (ref 0.1–1.0)
Monocytes Relative: 9 %
Neutro Abs: 4.7 10*3/uL (ref 1.7–7.7)
Neutrophils Relative %: 73 %
Platelets: 119 10*3/uL — ABNORMAL LOW (ref 150–400)
RBC: 2.67 MIL/uL — ABNORMAL LOW (ref 3.87–5.11)
RDW: 16.2 % — ABNORMAL HIGH (ref 11.5–15.5)
WBC: 6.4 10*3/uL (ref 4.0–10.5)
nRBC: 0.3 % — ABNORMAL HIGH (ref 0.0–0.2)

## 2019-10-04 LAB — RENAL FUNCTION PANEL
Albumin: 1.9 g/dL — ABNORMAL LOW (ref 3.5–5.0)
Anion gap: 9 (ref 5–15)
BUN: 40 mg/dL — ABNORMAL HIGH (ref 8–23)
CO2: 22 mmol/L (ref 22–32)
Calcium: 9 mg/dL (ref 8.9–10.3)
Chloride: 111 mmol/L (ref 98–111)
Creatinine, Ser: 7.04 mg/dL — ABNORMAL HIGH (ref 0.44–1.00)
GFR calc Af Amer: 6 mL/min — ABNORMAL LOW (ref >60)
GFR calc non Af Amer: 6 mL/min — ABNORMAL LOW (ref >60)
Glucose, Bld: 167 mg/dL — ABNORMAL HIGH (ref 70–99)
Phosphorus: 6.6 mg/dL — ABNORMAL HIGH (ref 2.5–4.6)
Potassium: 3.5 mmol/L (ref 3.5–5.1)
Sodium: 142 mmol/L (ref 135–145)

## 2019-10-04 LAB — IRON AND TIBC
Iron: 53 ug/dL (ref 28–170)
Saturation Ratios: 40 % — ABNORMAL HIGH (ref 10.4–31.8)
TIBC: 132 ug/dL — ABNORMAL LOW (ref 250–450)
UIBC: 79 ug/dL

## 2019-10-04 LAB — HEPATITIS B SURFACE ANTIGEN: Hepatitis B Surface Ag: NONREACTIVE

## 2019-10-04 LAB — GLUCOSE, CAPILLARY
Glucose-Capillary: 117 mg/dL — ABNORMAL HIGH (ref 70–99)
Glucose-Capillary: 160 mg/dL — ABNORMAL HIGH (ref 70–99)
Glucose-Capillary: 152 mg/dL — ABNORMAL HIGH (ref 70–99)
Glucose-Capillary: 157 mg/dL — ABNORMAL HIGH (ref 70–99)

## 2019-10-04 LAB — PARATHYROID HORMONE, INTACT (NO CA): PTH: 337 pg/mL — ABNORMAL HIGH (ref 15–65)

## 2019-10-04 LAB — FERRITIN: Ferritin: 618 ng/mL — ABNORMAL HIGH (ref 11–307)

## 2019-10-04 MED ORDER — HEPARIN SODIUM (PORCINE) 1000 UNIT/ML IJ SOLN
INTRAMUSCULAR | Status: AC
  Filled 2019-10-04: qty 4

## 2019-10-04 NOTE — Progress Notes (Signed)
Upper extremity vein mapping pre-AVF study completed.   See Cv Proc for preliminary results.   Darlin Coco

## 2019-10-04 NOTE — Progress Notes (Signed)
Patient ID: DARSHA ZUMSTEIN, female   DOB: 08-Jun-1955, 64 y.o.   MRN: [id number]  PROGRESS NOTE    KOURTLYNN TREVOR  GNF:[id number] DOB: 19-Nov-1955 DOA: 10/02/2019 PCP: Wenda Low, MD   Brief Narrative:  64 year old female with history of essential hypertension, diabetes mellitus type 2, chronic kidney disease stage V who was supposed to be started on peritoneal dialysis but was deferred because of incidental renal mass found on imaging, gout, recent discharge from hospital on 09/29/2019 for hypoglycemia was seen by PCP on 10/02/2019 for orthopnea and anasarca and was sent to the hospital for admission for possible initiation of hemodialysis.  In the ED, bicarb was 17 and creatinine was 9.1 which was almost the same from her recent creatinine on discharge.  Nephrology was consulted.  Assessment & Plan:   Chronic any disease stage V Worsening orthopnea and anasarca/fluid overload Non-anion gap metabolic acidosis -Nephrology following.  Status post right IJ tunneled HD catheter placement by IR on 10/03/2019.  Had dialysis overnight.  Dialysis as per nephrology schedule -Strict input and output.  Daily weights.  May follow-up with nephrology regarding Lasix and sodium bicarbonate use -Vascular surgery ulcers been consulted for pulmonary Dr. Ashok Croon.  Diabetes mellitus type 2 with recent episodes of hypoglycemia -Last A1c 5.4.  Continue CBGs with SSI.  Patient was recently hospitalized for episodes of hypoglycemia  Anemia of chronic disease -From renal failure.  Transfuse if hemoglobin is less than 7  Hypoalbuminemia -Nutrition consult  Essential hypertension -Blood pressure improving.  Monitor.  Continue amlodipine, Coreg, doxazosin and Lasix.  Hypothyroidism-continue levothyroxine  Morbid obesity -Outpatient follow-up    DVT prophylaxis: Heparin Code Status: Full Family Communication: Spoke to patient at bedside  disposition Plan: Status is: Inpatient  Remains inpatient  appropriate because:Inpatient level of care appropriate due to severity of illness   Dispo: The patient is from: Home              Anticipated d/c is to: Home              Anticipated d/c date is: > 3 days              Patient currently is not medically stable to d/c.  Consultants: Nephrology/IR  Procedures: right IJ tunneled HD catheter placement by IR on 10/03/2019  Antimicrobials: None   Subjective: Patient seen and examined at bedside.  Denies worsening shortness of breath.  No overnight fever, vomiting or diarrhea reported.  Objective: Vitals:   10/03/19 2330 10/04/19 0000 10/04/19 0133 10/04/19 0437  BP: (!) 145/74 (!) 150/89 (!) 148/68 (!) 145/63  Pulse: 96 (!) 102 100 98  Resp: '18 18 18 18  '$ Temp:   99 F (37.2 C) 98.9 F (37.2 C)  TempSrc:   Oral Oral  SpO2:   95% 96%  Weight:      Height:        Intake/Output Summary (Last 24 hours) at 10/04/2019 0757 Last data filed at 10/04/2019 0442 Gross per 24 hour  Intake 329.65 ml  Output 1900 ml  Net -1570.35 ml   Filed Weights   10/02/19 1221 10/03/19 2250  Weight: (!) 138.8 kg (!) 139 kg    Examination:  General exam: No acute distress.  Looks older than stated age. Respiratory system: Bilateral decreased breath sounds at bases with some crackles.  No wheezing Cardiovascular system: S1 & S2 heard, intermittently tachycardic Gastrointestinal system: Abdomen is morbidly obese, nondistended, soft and nontender.  Bowel sounds are heard Extremities: Trace bilateral  lower extremity edema present.  No clubbing  Central nervous system: Awake and alert.  No focal neurological deficits.  Moves extremities.   Skin: No obvious ulcers/ecchymosis Psychiatry: Flat affect   Data Reviewed: I have personally reviewed following labs and imaging studies  CBC: Recent Labs  Lab 09/28/19 0438 09/28/19 0438 09/28/19 1550 09/29/19 0633 10/02/19 1233 10/03/19 0153 10/04/19 0449  WBC 5.3  --   --  6.0 7.5 6.8 6.4  NEUTROABS   --   --   --   --   --   --  4.7  HGB 6.8*   < > 7.8* 8.3* 8.5* 7.2* 7.2*  HCT 24.4*   < > 28.0* 29.7* 30.3* 26.2* 25.0*  MCV 97.6  --   --  96.7 95.6 97.0 93.6  PLT 138*  --   --  159 152 131* 119*   < > = values in this interval not displayed.   Basic Metabolic Panel: Recent Labs  Lab 09/28/19 0438 09/29/19 0633 10/02/19 1233 10/03/19 0153 10/04/19 0449  NA 145 140 141 141 142  K 4.3 4.4 4.6 4.8 3.5  CL 119* 115* 115* 117* 111  CO2 17* 18* 17* 15* 22  GLUCOSE 33* 177* 133* 102* 167*  BUN 42* 44* 54* 60* 40*  CREATININE 8.67* 8.72* 9.10* 9.40* 7.04*  CALCIUM 9.2 9.4 9.8 9.4 9.0  PHOS  --   --   --  9.8* 6.6*   GFR: Estimated Creatinine Clearance: 11.9 mL/min (A) (by C-G formula based on SCr of 7.04 mg/dL (H)). Liver Function Tests: Recent Labs  Lab 09/29/19 0633 10/03/19 0153 10/04/19 0449  AST 14*  --   --   ALT 16  --   --   ALKPHOS 63  --   --   BILITOT 0.2*  --   --   PROT 5.0*  --   --   ALBUMIN 1.9* 1.8* 1.9*   No results for input(s): LIPASE, AMYLASE in the last 168 hours. No results for input(s): AMMONIA in the last 168 hours. Coagulation Profile: No results for input(s): INR, PROTIME in the last 168 hours. Cardiac Enzymes: No results for input(s): CKTOTAL, CKMB, CKMBINDEX, TROPONINI in the last 168 hours. BNP (last 3 results) No results for input(s): PROBNP in the last 8760 hours. HbA1C: No results for input(s): HGBA1C in the last 72 hours. CBG: Recent Labs  Lab 10/02/19 2120 10/03/19 0729 10/03/19 1141 10/03/19 1644 10/03/19 2036  GLUCAP 165* 93 91 97 146*   Lipid Profile: No results for input(s): CHOL, HDL, LDLCALC, TRIG, CHOLHDL, LDLDIRECT in the last 72 hours. Thyroid Function Tests: No results for input(s): TSH, T4TOTAL, FREET4, T3FREE, THYROIDAB in the last 72 hours. Anemia Panel: Recent Labs    10/03/19 2225  FERRITIN 618*  TIBC 132*  IRON 53   Sepsis Labs: No results for input(s): PROCALCITON, LATICACIDVEN in the last 168  hours.  Recent Results (from the past 240 hour(s))  SARS Coronavirus 2 by RT PCR (hospital order, performed in [facility] hospital lab) Nasopharyngeal Nasopharyngeal Swab     Status: None   Collection Time: 09/27/19  9:45 PM   Specimen: Nasopharyngeal Swab  Result Value Ref Range Status   SARS Coronavirus 2 NEGATIVE NEGATIVE Final    Comment: (NOTE) SARS-CoV-2 target nucleic acids are NOT DETECTED.  The SARS-CoV-2 RNA is generally detectable in upper and lower respiratory specimens during the acute phase of infection. The lowest concentration of SARS-CoV-2 viral copies this assay can detect is  250 copies / mL. A negative result does not preclude SARS-CoV-2 infection and should not be used as the sole basis for treatment or other patient management decisions.  A negative result may occur with improper specimen collection / handling, submission of specimen other than nasopharyngeal swab, presence of viral mutation(s) within the areas targeted by this assay, and inadequate number of viral copies (<250 copies / mL). A negative result must be combined with clinical observations, patient history, and epidemiological information.  Fact Sheet for Patients:   StrictlyIdeas.no  Fact Sheet for Healthcare Providers: BankingDealers.co.za  This test is not yet approved or  cleared by the [place] FDA and has been authorized for detection and/or diagnosis of SARS-CoV-2 by FDA under an Emergency Use Authorization (EUA).  This EUA will remain in effect (meaning this test can be used) for the duration of the COVID-19 declaration under Section 564(b)(1) of the Act, 21 U.S.C. section 360bbb-3(b)(1), unless the authorization is terminated or revoked sooner.  Performed at [facility], [place] [address] [place], [place] [address]          Radiology Studies: IR Fluoro Guide CV Line Right  Result Date: 10/03/2019 INDICATION: 64 year old  female with renal failure referred for hemodialysis catheter placement EXAM: IMAGE GUIDED TUNNELED HEMODIALYSIS CATHETER PLACEMENT MEDICATIONS: 2 g Ancef; The antibiotic was administered within an appropriate time interval prior to skin puncture. ANESTHESIA/SEDATION: Moderate (conscious) sedation was employed during this procedure. A total of Versed 0 mg and Fentanyl 25 mcg was administered intravenously. Moderate Sedation Time: 0 minutes. The patient's level of consciousness and vital signs were monitored continuously by radiology nursing throughout the procedure under my direct supervision. FLUOROSCOPY TIME:  Fluoroscopy Time: 0 minutes 36 seconds (5 mGy). COMPLICATIONS: None PROCEDURE: Informed written consent was obtained from the patient after a discussion of the risks, benefits, and alternatives to treatment. Questions regarding the procedure were encouraged and answered. The right neck and chest were prepped with chlorhexidine in a sterile fashion, and a sterile drape was applied covering the operative field. Maximum barrier sterile technique with sterile gowns and gloves were used for the procedure. A timeout was performed prior to the initiation of the procedure. Ultrasound survey was performed. Micropuncture kit was utilized to access the right internal jugular vein under direct, real-time ultrasound guidance after the overlying soft tissues were anesthetized with 1% lidocaine with epinephrine. Stab incision was made with 11 blade scalpel. Microwire was passed centrally. The microwire was then marked to measure appropriate internal catheter length. External tunneled length was estimated. A total tip to cuff length of 19 cm was selected. 035 guidewire was advanced to the level of the IVC. Skin and subcutaneous tissues of chest wall below the clavicle were generously infiltrated with 1% lidocaine for local anesthesia. A small stab incision was made with 11 blade scalpel. The selected hemodialysis catheter  was tunneled in a retrograde fashion from the anterior chest wall to the venotomy incision. Serial dilation was performed and then a peel-away sheath was placed. The catheter was then placed through the peel-away sheath with tips ultimately positioned within the superior aspect of the right atrium. Final catheter positioning was confirmed and documented with a spot radiographic image. The catheter aspirates and flushes normally. The catheter was flushed with appropriate volume heparin dwells. The catheter exit site was secured with a 0-Prolene retention suture. Gel-Foam slurry was infused into the soft tissue tract. The venotomy incision was closed Derma bond and sterile dressing. Dressings were applied at the  chest wall. Patient tolerated the procedure well and remained hemodynamically stable throughout. No complications were encountered and no significant blood loss encountered. IMPRESSION: Status post placement of right IJ tunneled hemodialysis catheter. Signed, Dulcy Fanny. Dellia Nims, RPVI [facility] Electronically Signed   By: Corrie Mckusick D.O.   On: 10/03/2019 15:45   IR US Guide Vasc Access Right  Result Date: 10/03/2019 INDICATION: 64 year old female with renal failure referred for hemodialysis catheter placement EXAM: IMAGE GUIDED TUNNELED HEMODIALYSIS CATHETER PLACEMENT MEDICATIONS: 2 g Ancef; The antibiotic was administered within an appropriate time interval prior to skin puncture. ANESTHESIA/SEDATION: Moderate (conscious) sedation was employed during this procedure. A total of Versed 0 mg and Fentanyl 25 mcg was administered intravenously. Moderate Sedation Time: 0 minutes. The patient's level of consciousness and vital signs were monitored continuously by radiology nursing throughout the procedure under my direct supervision. FLUOROSCOPY TIME:  Fluoroscopy Time: 0 minutes 36 seconds (5 mGy). COMPLICATIONS: None PROCEDURE: Informed written  consent was obtained from the patient after a discussion of the risks, benefits, and alternatives to treatment. Questions regarding the procedure were encouraged and answered. The right neck and chest were prepped with chlorhexidine in a sterile fashion, and a sterile drape was applied covering the operative field. Maximum barrier sterile technique with sterile gowns and gloves were used for the procedure. A timeout was performed prior to the initiation of the procedure. Ultrasound survey was performed. Micropuncture kit was utilized to access the right internal jugular vein under direct, real-time ultrasound guidance after the overlying soft tissues were anesthetized with 1% lidocaine with epinephrine. Stab incision was made with 11 blade scalpel. Microwire was passed centrally. The microwire was then marked to measure appropriate internal catheter length. External tunneled length was estimated. A total tip to cuff length of 19 cm was selected. 035 guidewire was advanced to the level of the IVC. Skin and subcutaneous tissues of chest wall below the clavicle were generously infiltrated with 1% lidocaine for local anesthesia. A small stab incision was made with 11 blade scalpel. The selected hemodialysis catheter was tunneled in a retrograde fashion from the anterior chest wall to the venotomy incision. Serial dilation was performed and then a peel-away sheath was placed. The catheter was then placed through the peel-away sheath with tips ultimately positioned within the superior aspect of the right atrium. Final catheter positioning was confirmed and documented with a spot radiographic image. The catheter aspirates and flushes normally. The catheter was flushed with appropriate volume heparin dwells. The catheter exit site was secured with a 0-Prolene retention suture. Gel-Foam slurry was infused into the soft tissue tract. The venotomy incision was closed Derma bond and sterile dressing. Dressings were applied at  the chest wall. Patient tolerated the procedure well and remained hemodynamically stable throughout. No complications were encountered and no significant blood loss encountered. IMPRESSION: Status post placement of right IJ tunneled hemodialysis catheter. Signed, Dulcy Fanny. Dellia Nims, RPVI [facility] Electronically Signed   By: Corrie Mckusick D.O.   On: 10/03/2019 15:45        Scheduled Meds: . amLODipine  10 mg Oral QHS  . aspirin EC  81 mg Oral Daily  . atorvastatin  40 mg Oral Daily  . carvedilol  25 mg Oral BID WC  . Chlorhexidine Gluconate Cloth  6 each Topical Q0600  . doxazosin  2 mg Oral QHS  . furosemide  80 mg Oral BID  . heparin injection (subcutaneous)  5,000 Units Subcutaneous Q8H  . heparin sodium (porcine)      . levothyroxine  25 mcg Oral Daily  . sodium bicarbonate  650 mg Oral BID  . sodium chloride flush  3 mL Intravenous Once  . sodium chloride flush  3 mL Intravenous Q12H   Continuous Infusions: . sodium chloride            Aline August, MD [facility] 10/04/2019, 7:57 AM

## 2019-10-04 NOTE — Progress Notes (Signed)
Vein map reviewed.  Will place right upper arm brachial cephalic AVF on Tuesday.  Please restrict right arm Ok to put IVs and blood draws left arm  Ruta Hinds, MD [facility] [place] Office: [phone]

## 2019-10-04 NOTE — Plan of Care (Signed)
  Problem: Education: Goal: Knowledge of General Education information will improve Description: Including pain rating scale, medication(s)/side effects and non-pharmacologic comfort measures Outcome: Progressing   Problem: Health Behavior/Discharge Planning: Goal: Ability to manage health-related needs will improve Outcome: Progressing   Problem: Clinical Measurements: Goal: Ability to maintain clinical measurements within normal limits will improve Outcome: Progressing Goal: Will remain free from infection Outcome: Progressing Goal: Respiratory complications will improve Outcome: Progressing Goal: Cardiovascular complication will be avoided Outcome: Progressing   Problem: Activity: Goal: Risk for activity intolerance will decrease Outcome: Progressing   Problem: Nutrition: Goal: Adequate nutrition will be maintained Outcome: Progressing   Problem: Coping: Goal: Level of anxiety will decrease Outcome: Progressing   Problem: Elimination: Goal: Will not experience complications related to bowel motility Outcome: Progressing   Problem: Pain Managment: Goal: General experience of comfort will improve Outcome: Progressing   Problem: Safety: Goal: Ability to remain free from injury will improve Outcome: Progressing   Problem: Skin Integrity: Goal: Risk for impaired skin integrity will decrease Outcome: Progressing

## 2019-10-04 NOTE — H&P (View-Only) (Signed)
Vein map reviewed.  Will place right upper arm brachial cephalic AVF on Tuesday.  Please restrict right arm Ok to put IVs and blood draws left arm  Ruta Hinds, MD [facility] [place] Office: [phone]

## 2019-10-04 NOTE — Progress Notes (Signed)
Admit: 10/02/2019 LOS: 2  21F new start HD, uremia  Subjective:   HD#1 yest after IR placed R IJ TDC  Uneventful  Tol 1L UF  CLIP won't happen until next week  No c/o at this time, still with LEE  VVS saw -- for AVF next week  07/16 0701 - 07/17 0700 In: 329.7 [P.O.:240; IV Piggyback:89.7] Out: 1900 [Urine:900]  Filed Weights   10/02/19 1221 10/03/19 2250  Weight: (!) 138.8 kg (!) 139 kg    Scheduled Meds:  amLODipine  10 mg Oral QHS   aspirin EC  81 mg Oral Daily   atorvastatin  40 mg Oral Daily   carvedilol  25 mg Oral BID WC   Chlorhexidine Gluconate Cloth  6 each Topical Q0600   doxazosin  2 mg Oral QHS   furosemide  80 mg Oral BID   heparin injection (subcutaneous)  5,000 Units Subcutaneous Q8H   heparin sodium (porcine)       levothyroxine  25 mcg Oral Daily   sodium bicarbonate  650 mg Oral BID   sodium chloride flush  3 mL Intravenous Once   sodium chloride flush  3 mL Intravenous Q12H   Continuous Infusions:  sodium chloride     PRN Meds:.sodium chloride, acetaminophen **OR** acetaminophen, ondansetron **OR** ondansetron (ZOFRAN) IV, polyethylene glycol, sodium chloride flush  Current Labs: reviewed  Results for TERRIKA, ZUVER (MRN [id number]) as of 10/04/2019 10:58  Ref. Range 10/03/2019 22:25  Saturation Ratios Latest Ref Range: 10.4 - 31.8 % 40 (H)  Ferritin Latest Ref Range: 11 - 307 ng/mL 618 (H)   Results for HEATHERLY, STENNER (MRN [id number]) as of 10/04/2019 10:58  Ref. Range 10/02/2019 20:15  PTH, Intact Latest Ref Range: 15 - 65 pg/mL 337 (H)   Physical Exam:  Blood pressure (!) 145/63, pulse 98, temperature 98.9 F (37.2 C), temperature source Oral, resp. rate 18, height 5' 8.5" (1.74 m), weight (!) 139 kg, SpO2 96 %. NAD RRR with 2/6 MSM, no rub CTAB 2-3+ LEE b/l Nonfocal S/nt/nd EOMI  A 1. New ESRD with uremia; plan was for PD but put on hold 2/2 #2; started HD 7/16 with [facility]; VVS following for AVF 2. R Renal mass,  seeing urology 3. DM2 4. Anemia, Hb 7s: fe replete 5. Gout  6. HTN 7. NAGMA 2/2 #1 8. CKD-BMD, hyperphosphatemia  P  HD #2 today: 3h, 2L UF max, 3K bath, TDC, tight hep  Plan for AVF next week  With problem #2, hold ESA For now, Fe replete  Trend P with starting HD  Medication Issues; o Preferred narcotic agents for pain control are hydromorphone, fentanyl, and methadone. Morphine should not be used.  o Baclofen should be avoided o Avoid oral sodium phosphate and magnesium citrate based laxatives / bowel preps    Pearson Grippe MD 10/04/2019, 10:59 AM  Recent Labs  Lab 10/02/19 1233 10/03/19 0153 10/04/19 0449  NA 141 141 142  K 4.6 4.8 3.5  CL 115* 117* 111  CO2 17* 15* 22  GLUCOSE 133* 102* 167*  BUN 54* 60* 40*  CREATININE 9.10* 9.40* 7.04*  CALCIUM 9.8 9.4 9.0  PHOS  --  9.8* 6.6*   Recent Labs  Lab 10/02/19 1233 10/03/19 0153 10/04/19 0449  WBC 7.5 6.8 6.4  NEUTROABS  --   --  4.7  HGB 8.5* 7.2* 7.2*  HCT 30.3* 26.2* 25.0*  MCV 95.6 97.0 93.6  PLT 152 131* 119*

## 2019-10-05 LAB — CBC WITH DIFFERENTIAL/PLATELET
Abs Immature Granulocytes: 0.04 10*3/uL (ref 0.00–0.07)
Basophils Absolute: 0 10*3/uL (ref 0.0–0.1)
Basophils Relative: 0 %
Eosinophils Absolute: 0.5 10*3/uL (ref 0.0–0.5)
Eosinophils Relative: 8 %
HCT: 25.4 % — ABNORMAL LOW (ref 36.0–46.0)
Hemoglobin: 7.3 g/dL — ABNORMAL LOW (ref 12.0–15.0)
Immature Granulocytes: 1 %
Lymphocytes Relative: 16 %
Lymphs Abs: 1 10*3/uL (ref 0.7–4.0)
MCH: 26.8 pg (ref 26.0–34.0)
MCHC: 28.7 g/dL — ABNORMAL LOW (ref 30.0–36.0)
MCV: 93.4 fL (ref 80.0–100.0)
Monocytes Absolute: 0.6 10*3/uL (ref 0.1–1.0)
Monocytes Relative: 9 %
Neutro Abs: 4.2 10*3/uL (ref 1.7–7.7)
Neutrophils Relative %: 66 %
Platelets: 122 10*3/uL — ABNORMAL LOW (ref 150–400)
RBC: 2.72 MIL/uL — ABNORMAL LOW (ref 3.87–5.11)
RDW: 16.1 % — ABNORMAL HIGH (ref 11.5–15.5)
WBC: 6.3 10*3/uL (ref 4.0–10.5)
nRBC: 0 % (ref 0.0–0.2)

## 2019-10-05 LAB — GLUCOSE, CAPILLARY
Glucose-Capillary: 100 mg/dL — ABNORMAL HIGH (ref 70–99)
Glucose-Capillary: 160 mg/dL — ABNORMAL HIGH (ref 70–99)
Glucose-Capillary: 171 mg/dL — ABNORMAL HIGH (ref 70–99)
Glucose-Capillary: 168 mg/dL — ABNORMAL HIGH (ref 70–99)

## 2019-10-05 LAB — RENAL FUNCTION PANEL
Albumin: 1.9 g/dL — ABNORMAL LOW (ref 3.5–5.0)
Anion gap: 9 (ref 5–15)
BUN: 19 mg/dL (ref 8–23)
CO2: 26 mmol/L (ref 22–32)
Calcium: 8.2 mg/dL — ABNORMAL LOW (ref 8.9–10.3)
Chloride: 104 mmol/L (ref 98–111)
Creatinine, Ser: 3.93 mg/dL — ABNORMAL HIGH (ref 0.44–1.00)
GFR calc Af Amer: 13 mL/min — ABNORMAL LOW (ref >60)
GFR calc non Af Amer: 11 mL/min — ABNORMAL LOW (ref >60)
Glucose, Bld: 109 mg/dL — ABNORMAL HIGH (ref 70–99)
Phosphorus: 3.6 mg/dL (ref 2.5–4.6)
Potassium: 3.1 mmol/L — ABNORMAL LOW (ref 3.5–5.1)
Sodium: 139 mmol/L (ref 135–145)

## 2019-10-05 LAB — PTH, INTACT AND CALCIUM
Calcium, Total (PTH): 9.4 mg/dL (ref 8.7–10.3)
PTH: 355 pg/mL — ABNORMAL HIGH (ref 15–65)

## 2019-10-05 LAB — COMPREHENSIVE METABOLIC PANEL
ALT: 12 U/L (ref 0–44)
AST: 16 U/L (ref 15–41)
Albumin: 2 g/dL — ABNORMAL LOW (ref 3.5–5.0)
Alkaline Phosphatase: 55 U/L (ref 38–126)
Anion gap: 9 (ref 5–15)
BUN: 18 mg/dL (ref 8–23)
CO2: 26 mmol/L (ref 22–32)
Calcium: 8.3 mg/dL — ABNORMAL LOW (ref 8.9–10.3)
Chloride: 104 mmol/L (ref 98–111)
Creatinine, Ser: 3.98 mg/dL — ABNORMAL HIGH (ref 0.44–1.00)
GFR calc Af Amer: 13 mL/min — ABNORMAL LOW (ref >60)
GFR calc non Af Amer: 11 mL/min — ABNORMAL LOW (ref >60)
Glucose, Bld: 109 mg/dL — ABNORMAL HIGH (ref 70–99)
Potassium: 3.2 mmol/L — ABNORMAL LOW (ref 3.5–5.1)
Sodium: 139 mmol/L (ref 135–145)
Total Bilirubin: 0.4 mg/dL (ref 0.3–1.2)
Total Protein: 5.1 g/dL — ABNORMAL LOW (ref 6.5–8.1)

## 2019-10-05 MED ORDER — HEPARIN SODIUM (PORCINE) 1000 UNIT/ML DIALYSIS: 20.0000 [IU]/kg | INTRAMUSCULAR | Status: DC | PRN

## 2019-10-05 MED ORDER — HEPARIN SODIUM (PORCINE) 1000 UNIT/ML IJ SOLN
INTRAMUSCULAR | Status: AC
  Filled 2019-10-05: qty 4

## 2019-10-05 NOTE — Progress Notes (Signed)
Admit: 10/02/2019 LOS: 3  89F new start HD, uremia  Subjective:  . HD#2 overnight, 2L UF . Still some LEE . For AVF 7/20  07/17 0701 - 07/18 0700 In: 420 [P.O.:420] Out: 2226 [Urine:200]  Filed Weights   10/02/19 1221 10/03/19 2250  Weight: (!) 138.8 kg (!) 139 kg    Scheduled Meds: . amLODipine  10 mg Oral QHS  . aspirin EC  81 mg Oral Daily  . atorvastatin  40 mg Oral Daily  . carvedilol  25 mg Oral BID WC  . Chlorhexidine Gluconate Cloth  6 each Topical Q0600  . doxazosin  2 mg Oral QHS  . heparin injection (subcutaneous)  5,000 Units Subcutaneous Q8H  . heparin sodium (porcine)      . levothyroxine  25 mcg Oral Daily  . sodium chloride flush  3 mL Intravenous Once  . sodium chloride flush  3 mL Intravenous Q12H   Continuous Infusions: . sodium chloride     PRN Meds:.sodium chloride, acetaminophen **OR** acetaminophen, ondansetron **OR** ondansetron (ZOFRAN) IV, polyethylene glycol, sodium chloride flush  Current Labs: reviewed  Results for GUELDA, BATSON (MRN [id number]) as of 10/04/2019 10:58  Ref. Range 10/03/2019 22:25  Saturation Ratios Latest Ref Range: 10.4 - 31.8 % 40 (H)  Ferritin Latest Ref Range: 11 - 307 ng/mL 618 (H)   Results for SADIYA, DURAND (MRN [id number]) as of 10/04/2019 10:58  Ref. Range 10/02/2019 20:15  PTH, Intact Latest Ref Range: 15 - 65 pg/mL 337 (H)   Physical Exam:  Blood pressure (!) 179/67, pulse (!) 101, temperature 99.3 F (37.4 C), resp. rate 18, height 5' 8.5" (1.74 m), weight (!) 139 kg, SpO2 (!) 89 %. NAD RRR with 2/6 MSM, no rub CTAB 2+ LEE b/l Nonfocal S/nt/nd EOMI  A 1. New ESRD with uremia; plan was for PD but put on hold 2/2 #2; started HD 7/16 with [facility]; VVS following for AVF planned 7/20 2. 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 DM2 4. Anemia, Hb 7s: fe replete 5. Gout  6. HTN 7. NAGMA 2/2 #1 8. CKD-BMD, hyperphosphatemia, no binders currently, trending  P . HD #3 tomorrow:  3h, 2-3L UF max, 3K bath, TDC, tight hep . Plan for AVF 7/20 . With problem #2, hold ESA For now, Fe replete, transfuse prn . Medication Issues; o Preferred narcotic agents for pain control are hydromorphone, fentanyl, and methadone. Morphine should not be used.  o Baclofen should be avoided o Avoid oral sodium phosphate and magnesium citrate based laxatives / bowel preps    Pearson Grippe MD 10/05/2019, 11:15 AM  Recent Labs  Lab 10/03/19 0153 10/04/19 0449 10/05/19 0540  NA 141 142 139  139  K 4.8 3.5 3.2*  3.1*  CL 117* 111 104  104  CO2 15* 22 26  26   GLUCOSE 102* 167* 109*  109*  BUN 60* 40* 18  19  CREATININE 9.40* 7.04* 3.98*  3.93*  CALCIUM 9.4 9.0 8.3*  8.2*  PHOS 9.8* 6.6* 3.6   Recent Labs  Lab 10/03/19 0153 10/04/19 0449 10/05/19 0540  WBC 6.8 6.4 6.3  NEUTROABS  --  4.7 4.2  HGB 7.2* 7.2* 7.3*  HCT 26.2* 25.0* 25.4*  MCV 97.0 93.6 93.4  PLT 131* 119* 122*

## 2019-10-05 NOTE — Evaluation (Signed)
Physical Therapy Evaluation Patient Details Name: Kristen English MRN: [id number] DOB: [birth year] Today's Date: 10/05/2019   History of Present Illness  Patient is a 64 y/o female who presents from PCP office due to orthopnea, SOB and anasarca with plan to start HD, s/p RIght IJ tunneled catheter placement 7/16. PMH includes HTN, DM, CKD stage V, gout. Recent admission and d/c 7/16 for hypogylcemia.  Clinical Impression  Patient presents with abdominal pain and impaired mobility s/p above. Pt lives alone and reports being Mod I for ADLs and ambulation PTA. Uses SPC as needed. Today, pt tolerated transfers and gait training with Min guard-supervision for safety. Reports feeling tired after dialysis this AM and reports muscle soreness in right thigh resulting in an antalgic like gait pattern. No overt LOB. Encouraged OOB to chair as able. Will follow acutely to maximize independence and mobility prior to return home.     Follow Up Recommendations No PT follow up;Supervision - Intermittent    Equipment Recommendations  None recommended by PT    Recommendations for Other Services       Precautions / Restrictions Precautions Precautions: Fall Restrictions Weight Bearing Restrictions: No      Mobility  Bed Mobility Overal bed mobility: Modified Independent             General bed mobility comments: HOB elevated.  Transfers Overall transfer level: Needs assistance Equipment used: None Transfers: Sit to/from Stand Sit to Stand: Supervision;From elevated surface         General transfer comment: Supervision for safety.  Ambulation/Gait Ambulation/Gait assistance: Min guard;Supervision Gait Distance (Feet): 120 Feet Assistive device: None Gait Pattern/deviations: Step-through pattern;Decreased stride length;Wide base of support;Antalgic Gait velocity: decreased Gait velocity interpretation: <1.31 ft/sec, indicative of household ambulator General Gait Details: Slow,  antalgic gait due to right thigh pain. Declined use of RW. Sp02 dropped to 87% with ambulation on RA, donned 2L at end of session as pt with Sp02 at 86-88% on RA at rest prior to activity.  Stairs            Wheelchair Mobility    Modified Rankin (Stroke Patients Only)       Balance Overall balance assessment: Mild deficits observed, not formally tested                                           Pertinent Vitals/Pain Pain Assessment: 0-10 Pain Score: 8  Pain Location: abdomen Pain Descriptors / Indicators: Tightness;Sore Pain Intervention(s): Monitored during session;Repositioned;Limited activity within patient's tolerance    Home Living Family/patient expects to be discharged to:: Private residence Living Arrangements: Alone Available Help at Discharge: Family;Available PRN/intermittently Type of Home: House Home Access: Stairs to enter Entrance Stairs-Rails: None Entrance Stairs-Number of Steps: 3 Home Layout: One level Home Equipment: Cane - single point;Bedside commode;Hand held shower head;Grab bars - tub/shower      Prior Function Level of Independence: Independent with assistive device(s);Needs assistance   Gait / Transfers Assistance Needed: used cane when needed  ADL's / Homemaking Assistance Needed: Doing own ADLs.        Hand Dominance   Dominant Hand: Right    Extremity/Trunk Assessment   Upper Extremity Assessment Upper Extremity Assessment: Defer to OT evaluation    Lower Extremity Assessment Lower Extremity Assessment: RLE deficits/detail RLE Deficits / Details: soreness in right quad per report, has been dealing with this for a  few months.    Cervical / Trunk Assessment Cervical / Trunk Assessment: Normal  Communication   Communication: No difficulties  Cognition Arousal/Alertness: Awake/alert Behavior During Therapy: WFL for tasks assessed/performed Overall Cognitive Status: Within Functional Limits for tasks  assessed                                        General Comments General comments (skin integrity, edema, etc.): Sp02 86-88% on RA at rest; dropped to 87% on RA with activity. Donned 2L 02 at end of session.    Exercises     Assessment/Plan    PT Assessment Patient needs continued PT services  PT Problem List Decreased strength;Decreased mobility;Pain;Decreased balance;Decreased activity tolerance       PT Treatment Interventions Therapeutic activities;Gait training;Stair training;Balance training;Therapeutic exercise;Patient/family education;Functional mobility training    PT Goals (Current goals can be found in the Care Plan section)  Acute Rehab PT Goals Patient Stated Goal: to feel better and rest PT Goal Formulation: With patient Time For Goal Achievement: 10/19/19 Potential to Achieve Goals: Good    Frequency Min 3X/week   Barriers to discharge Decreased caregiver support lives alone    Co-evaluation               AM-PAC PT "6 Clicks" Mobility  Outcome Measure Help needed turning from your back to your side while in a flat bed without using bedrails?: None Help needed moving from lying on your back to sitting on the side of a flat bed without using bedrails?: None Help needed moving to and from a bed to a chair (including a wheelchair)?: None Help needed standing up from a chair using your arms (e.g., wheelchair or bedside chair)?: A Little Help needed to walk in hospital room?: A Little Help needed climbing 3-5 steps with a railing? : A Little 6 Click Score: 21    End of Session   Activity Tolerance: Treatment limited secondary to medical complications (Comment) (drop in Sp02) Patient left: in bed;with call bell/phone within reach (sitting EOB) Nurse Communication: Mobility status;Other (comment) (need for 02) PT Visit Diagnosis: Muscle weakness (generalized) (M62.81);Pain Pain - part of body:  (abdomen)    Time: 1696-7893 PT Time  Calculation (min) (ACUTE ONLY): 24 min   Charges:   PT Evaluation $PT Eval Low Complexity: 1 Low PT Treatments $Gait Training: 8-22 mins        Marisa Severin, PT, DPT Acute Rehabilitation Services Pager [phone] Office [place] 10/05/2019, 12:29 PM

## 2019-10-05 NOTE — Social Work (Addendum)
12:36pm- No PT f/u rec, no DME rec. Renal navigator is working with pt for outpatient dialysis arrangements.   8:07am- CSW acknowledging consult for Home Health/DME. Will follow for therapy recommendations needed to best determine disposition/for insurance authorization.   Westley Hummer, MSW, [facility]

## 2019-10-05 NOTE — Progress Notes (Signed)
Patient ID: Kristen English, female   DOB: 1955-03-24, 64 y.o.   MRN: [id number]  PROGRESS NOTE    Kristen English  XVQ:[id number] DOB: May 25, 1955 DOA: 10/02/2019 PCP: Wenda Low, MD   Brief Narrative:  64 year old female with history of essential hypertension, diabetes mellitus type 2, chronic kidney disease stage V who was supposed to be started on peritoneal dialysis but was deferred because of incidental renal mass found on imaging, gout, recent discharge from hospital on 09/29/2019 for hypoglycemia was seen by PCP on 10/02/2019 for orthopnea and anasarca and was sent to the hospital for admission for possible initiation of hemodialysis.  In the ED, bicarb was 17 and creatinine was 9.1 which was almost the same from her recent creatinine on discharge.  Nephrology was consulted.  Assessment & Plan:   Chronic any disease stage V Worsening orthopnea and anasarca/fluid overload Non-anion gap metabolic acidosis -Nephrology following.  Status post right IJ tunneled HD catheter placement by IR on 10/03/2019.  Subsequently patient has been started on dialysis.  Dialysis as per nephrology schedule -Strict input and output.  Daily weights.  Currently still on Lasix and oral sodium bicarbonate: Follow-up with nephrology regarding the necessity for these now that the patient is on dialysis -Vascular surgery ulcers been consulted for permanent access placement.  Diabetes mellitus type 2 with recent episodes of hypoglycemia -Last A1c 5.4.  Continue CBGs with SSI.  Patient was recently hospitalized for episodes of hypoglycemia  Anemia of chronic disease -From renal failure.  Transfuse if hemoglobin is less than 7  Hypoalbuminemia -Nutrition consult  Essential hypertension -Blood pressure improving.  Monitor.  Continue amlodipine, Coreg, doxazosin and Lasix.  Hypothyroidism-continue levothyroxine  Morbid obesity -Outpatient follow-up    DVT prophylaxis: Heparin Code Status: Full Family  Communication: Spoke to patient at bedside  disposition Plan: Status is: Inpatient  Remains inpatient appropriate because:Inpatient level of care appropriate due to severity of illness   Dispo: The patient is from: Home              Anticipated d/c is to: Home              Anticipated d/c date is: > 3 days              Patient currently is not medically stable to d/c.  Consultants: Nephrology/IR  Procedures: right IJ tunneled HD catheter placement by IR on 10/03/2019  Antimicrobials: None   Subjective: Patient seen and examined at bedside.  No new complaints.  No fever, vomiting, diarrhea reported.  Still mildly short of breath with exertion.  Patient had dialysis again this morning.  Objective: Vitals:   10/05/19 0530 10/05/19 0538 10/05/19 0600 10/05/19 0621  BP: (!) 164/90 (!) 166/79 (!) 160/80 (!) 179/67  Pulse:   100 (!) 101  Resp:   18 18  Temp:   98.2 F (36.8 C) 99.3 F (37.4 C)  TempSrc:   Oral   SpO2:   92% (!) 89%  Weight:      Height:        Intake/Output Summary (Last 24 hours) at 10/05/2019 0815 Last data filed at 10/05/2019 0600 Gross per 24 hour  Intake 420 ml  Output 2226 ml  Net -1806 ml   Filed Weights   10/02/19 1221 10/03/19 2250  Weight: (!) 138.8 kg (!) 139 kg    Examination:  General exam: No distress.  Looks older than stated age. Respiratory system: Bilateral decreased breath sounds at bases with scattered crackles cardiovascular  system: S1 & S2 heard, mildly tachycardic intermittently Gastrointestinal system: Abdomen is morbidly obese, nondistended, soft and nontender.  Normal bowel sounds heard  extremities: Mild lower extremity edema present no clubbing    Data Reviewed: I have personally reviewed following labs and imaging studies  CBC: Recent Labs  Lab 09/29/19 0633 10/02/19 1233 10/03/19 0153 10/04/19 0449 10/05/19 0540  WBC 6.0 7.5 6.8 6.4 6.3  NEUTROABS  --   --   --  4.7 4.2  HGB 8.3* 8.5* 7.2* 7.2* 7.3*  HCT 29.7*  30.3* 26.2* 25.0* 25.4*  MCV 96.7 95.6 97.0 93.6 93.4  PLT 159 152 131* 119* 962*   Basic Metabolic Panel: Recent Labs  Lab 09/29/19 0633 10/02/19 1233 10/03/19 0153 10/04/19 0449 10/05/19 0540  NA 140 141 141 142 139  139  K 4.4 4.6 4.8 3.5 3.2*  3.1*  CL 115* 115* 117* 111 104  104  CO2 18* 17* 15* '22 26  26  '$ GLUCOSE 177* 133* 102* 167* 109*  109*  BUN 44* 54* 60* 40* 18  19  CREATININE 8.72* 9.10* 9.40* 7.04* 3.98*  3.93*  CALCIUM 9.4 9.8 9.4 9.0 8.3*  8.2*  PHOS  --   --  9.8* 6.6* 3.6   GFR: Estimated Creatinine Clearance: 21.3 mL/min (A) (by C-G formula based on SCr of 3.93 mg/dL (H)). Liver Function Tests: Recent Labs  Lab 09/29/19 0633 10/03/19 0153 10/04/19 0449 10/05/19 0540  AST 14*  --   --  16  ALT 16  --   --  12  ALKPHOS 63  --   --  55  BILITOT 0.2*  --   --  0.4  PROT 5.0*  --   --  5.1*  ALBUMIN 1.9* 1.8* 1.9* 2.0*  1.9*   No results for input(s): LIPASE, AMYLASE in the last 168 hours. No results for input(s): AMMONIA in the last 168 hours. Coagulation Profile: No results for input(s): INR, PROTIME in the last 168 hours. Cardiac Enzymes: No results for input(s): CKTOTAL, CKMB, CKMBINDEX, TROPONINI in the last 168 hours. BNP (last 3 results) No results for input(s): PROBNP in the last 8760 hours. HbA1C: No results for input(s): HGBA1C in the last 72 hours. CBG: Recent Labs  Lab 10/04/19 0800 10/04/19 1211 10/04/19 1726 10/04/19 2050 10/05/19 0724  GLUCAP 117* 160* 152* 157* 100*   Lipid Profile: No results for input(s): CHOL, HDL, LDLCALC, TRIG, CHOLHDL, LDLDIRECT in the last 72 hours. Thyroid Function Tests: No results for input(s): TSH, T4TOTAL, FREET4, T3FREE, THYROIDAB in the last 72 hours. Anemia Panel: Recent Labs    10/03/19 2225  FERRITIN 618*  TIBC 132*  IRON 53   Sepsis Labs: No results for input(s): PROCALCITON, LATICACIDVEN in the last 168 hours.  Recent Results (from the past 240 hour(s))  SARS  Coronavirus 2 by RT PCR (hospital order, performed in [facility] hospital lab) Nasopharyngeal Nasopharyngeal Swab     Status: None   Collection Time: 09/27/19  9:45 PM   Specimen: Nasopharyngeal Swab  Result Value Ref Range Status   SARS Coronavirus 2 NEGATIVE NEGATIVE Final    Comment: (NOTE) SARS-CoV-2 target nucleic acids are NOT DETECTED.  The SARS-CoV-2 RNA is generally detectable in upper and lower respiratory specimens during the acute phase of infection. The lowest concentration of SARS-CoV-2 viral copies this assay can detect is 250 copies / mL. A negative result does not preclude SARS-CoV-2 infection and should not be used as the sole basis for treatment or other  patient management decisions.  A negative result may occur with improper specimen collection / handling, submission of specimen other than nasopharyngeal swab, presence of viral mutation(s) within the areas targeted by this assay, and inadequate number of viral copies (<250 copies / mL). A negative result must be combined with clinical observations, patient history, and epidemiological information.  Fact Sheet for Patients:   StrictlyIdeas.no  Fact Sheet for Healthcare Providers: BankingDealers.co.za  This test is not yet approved or  cleared by the [place] FDA and has been authorized for detection and/or diagnosis of SARS-CoV-2 by FDA under an Emergency Use Authorization (EUA).  This EUA will remain in effect (meaning this test can be used) for the duration of the COVID-19 declaration under Section 564(b)(1) of the Act, 21 U.S.C. section 360bbb-3(b)(1), unless the authorization is terminated or revoked sooner.  Performed at [facility], [place] [address], [place], [place] [address]          Radiology Studies: IR Fluoro Guide CV Line Right  Result Date: 10/03/2019 INDICATION: 64 year old female with renal failure referred for hemodialysis  catheter placement EXAM: IMAGE GUIDED TUNNELED HEMODIALYSIS CATHETER PLACEMENT MEDICATIONS: 2 g Ancef; The antibiotic was administered within an appropriate time interval prior to skin puncture. ANESTHESIA/SEDATION: Moderate (conscious) sedation was employed during this procedure. A total of Versed 0 mg and Fentanyl 25 mcg was administered intravenously. Moderate Sedation Time: 0 minutes. The patient's level of consciousness and vital signs were monitored continuously by radiology nursing throughout the procedure under my direct supervision. FLUOROSCOPY TIME:  Fluoroscopy Time: 0 minutes 36 seconds (5 mGy). COMPLICATIONS: None PROCEDURE: Informed written consent was obtained from the patient after a discussion of the risks, benefits, and alternatives to treatment. Questions regarding the procedure were encouraged and answered. The right neck and chest were prepped with chlorhexidine in a sterile fashion, and a sterile drape was applied covering the operative field. Maximum barrier sterile technique with sterile gowns and gloves were used for the procedure. A timeout was performed prior to the initiation of the procedure. Ultrasound survey was performed. Micropuncture kit was utilized to access the right internal jugular vein under direct, real-time ultrasound guidance after the overlying soft tissues were anesthetized with 1% lidocaine with epinephrine. Stab incision was made with 11 blade scalpel. Microwire was passed centrally. The microwire was then marked to measure appropriate internal catheter length. External tunneled length was estimated. A total tip to cuff length of 19 cm was selected. 035 guidewire was advanced to the level of the IVC. Skin and subcutaneous tissues of chest wall below the clavicle were generously infiltrated with 1% lidocaine for local anesthesia. A small stab incision was made with 11 blade scalpel. The selected hemodialysis catheter was tunneled in a retrograde fashion from the anterior  chest wall to the venotomy incision. Serial dilation was performed and then a peel-away sheath was placed. The catheter was then placed through the peel-away sheath with tips ultimately positioned within the superior aspect of the right atrium. Final catheter positioning was confirmed and documented with a spot radiographic image. The catheter aspirates and flushes normally. The catheter was flushed with appropriate volume heparin dwells. The catheter exit site was secured with a 0-Prolene retention suture. Gel-Foam slurry was infused into the soft tissue tract. The venotomy incision was closed Derma bond and sterile dressing. Dressings were applied at the chest wall. Patient tolerated the procedure well and remained hemodynamically stable throughout. No complications were encountered and no significant blood loss encountered. IMPRESSION: Status post  placement of right IJ tunneled hemodialysis catheter. Signed, Dulcy Fanny. Dellia Nims, RPVI [facility] Radiology Electronically Signed   By: Corrie Mckusick D.O.   On: 10/03/2019 15:45   IR US Guide Vasc Access Right  Result Date: 10/03/2019 INDICATION: 65 year old female with renal failure referred for hemodialysis catheter placement EXAM: IMAGE GUIDED TUNNELED HEMODIALYSIS CATHETER PLACEMENT MEDICATIONS: 2 g Ancef; The antibiotic was administered within an appropriate time interval prior to skin puncture. ANESTHESIA/SEDATION: Moderate (conscious) sedation was employed during this procedure. A total of Versed 0 mg and Fentanyl 25 mcg was administered intravenously. Moderate Sedation Time: 0 minutes. The patient's level of consciousness and vital signs were monitored continuously by radiology nursing throughout the procedure under my direct supervision. FLUOROSCOPY TIME:  Fluoroscopy Time: 0 minutes 36 seconds (5 mGy). COMPLICATIONS: None PROCEDURE: Informed written consent was obtained from the patient after a discussion  of the risks, benefits, and alternatives to treatment. Questions regarding the procedure were encouraged and answered. The right neck and chest were prepped with chlorhexidine in a sterile fashion, and a sterile drape was applied covering the operative field. Maximum barrier sterile technique with sterile gowns and gloves were used for the procedure. A timeout was performed prior to the initiation of the procedure. Ultrasound survey was performed. Micropuncture kit was utilized to access the right internal jugular vein under direct, real-time ultrasound guidance after the overlying soft tissues were anesthetized with 1% lidocaine with epinephrine. Stab incision was made with 11 blade scalpel. Microwire was passed centrally. The microwire was then marked to measure appropriate internal catheter length. External tunneled length was estimated. A total tip to cuff length of 19 cm was selected. 035 guidewire was advanced to the level of the IVC. Skin and subcutaneous tissues of chest wall below the clavicle were generously infiltrated with 1% lidocaine for local anesthesia. A small stab incision was made with 11 blade scalpel. The selected hemodialysis catheter was tunneled in a retrograde fashion from the anterior chest wall to the venotomy incision. Serial dilation was performed and then a peel-away sheath was placed. The catheter was then placed through the peel-away sheath with tips ultimately positioned within the superior aspect of the right atrium. Final catheter positioning was confirmed and documented with a spot radiographic image. The catheter aspirates and flushes normally. The catheter was flushed with appropriate volume heparin dwells. The catheter exit site was secured with a 0-Prolene retention suture. Gel-Foam slurry was infused into the soft tissue tract. The venotomy incision was closed Derma bond and sterile dressing. Dressings were applied at the chest wall. Patient tolerated the procedure well and  remained hemodynamically stable throughout. No complications were encountered and no significant blood loss encountered. IMPRESSION: Status post placement of right IJ tunneled hemodialysis catheter. Signed, Dulcy Fanny. Dellia Nims, RPVI [facility] Radiology Electronically Signed   By: Corrie Mckusick D.O.   On: 10/03/2019 15:45   VAS [place] UPPER EXT VEIN MAPPING (PRE-OP AVF)  Result Date: 10/04/2019 UPPER EXTREMITY VEIN MAPPING  Indications: Pre-access. Comparison Study: No prior studies. Performing Technologist: Darlin Coco Supporting Technologist: Sharion Dove RVS  Examination Guidelines: A complete evaluation includes B-mode imaging, spectral Doppler, color Doppler, and power Doppler as needed of all accessible portions of each vessel. Bilateral testing is considered an integral part of a complete examination. Limited examinations for reoccurring indications may be performed as noted. +-----------------+-------------+----------+--------------+ Right Cephalic   Diameter (cm)Depth (cm)   Findings    +-----------------+-------------+----------+--------------+ Shoulder  0.47        2.27                  +-----------------+-------------+----------+--------------+ Prox upper arm       0.47        1.81                  +-----------------+-------------+----------+--------------+ Mid upper arm        0.37        2.00     branching    +-----------------+-------------+----------+--------------+ Dist upper arm       0.46        1.60                  +-----------------+-------------+----------+--------------+ Antecubital fossa    1.12        0.47                  +-----------------+-------------+----------+--------------+ Prox forearm         0.45        1.22     branching    +-----------------+-------------+----------+--------------+ Mid forearm          0.26        0.85     branching     +-----------------+-------------+----------+--------------+ Dist forearm         0.22        0.77                  +-----------------+-------------+----------+--------------+ Wrist                                   not visualized +-----------------+-------------+----------+--------------+ +-----------------+-------------+----------+--------------+ Right Basilic    Diameter (cm)Depth (cm)   Findings    +-----------------+-------------+----------+--------------+ Prox upper arm       0.36                              +-----------------+-------------+----------+--------------+ Mid upper arm        0.32                              +-----------------+-------------+----------+--------------+ Dist upper arm       0.30                 branching    +-----------------+-------------+----------+--------------+ Antecubital fossa    0.30                 branching    +-----------------+-------------+----------+--------------+ Prox forearm         0.15                              +-----------------+-------------+----------+--------------+ Mid forearm          0.11                              +-----------------+-------------+----------+--------------+ Distal forearm       0.11                              +-----------------+-------------+----------+--------------+ Wrist  not visualized +-----------------+-------------+----------+--------------+ +-----------------+-------------+----------+------------------------------+ Left Cephalic    Diameter (cm)Depth (cm)           Findings            +-----------------+-------------+----------+------------------------------+ Shoulder                                        not visualized         +-----------------+-------------+----------+------------------------------+ Prox upper arm       0.16        0.17                                   +-----------------+-------------+----------+------------------------------+ Mid upper arm        0.27        1.60             branching            +-----------------+-------------+----------+------------------------------+ Dist upper arm       0.24        1.29             branching            +-----------------+-------------+----------+------------------------------+ Antecubital fossa    0.47        0.49                                  +-----------------+-------------+----------+------------------------------+ Prox forearm         0.26        0.63                                  +-----------------+-------------+----------+------------------------------+ Mid forearm          0.20        0.54   branching and partial thrombus +-----------------+-------------+----------+------------------------------+ Dist forearm         0.18        0.59                                  +-----------------+-------------+----------+------------------------------+ Wrist                0.20        0.69                                  +-----------------+-------------+----------+------------------------------+ +-----------------+-------------+----------+--------------+ Left Basilic     Diameter (cm)Depth (cm)   Findings    +-----------------+-------------+----------+--------------+ Dist upper arm       0.28                   Origin     +-----------------+-------------+----------+--------------+ Antecubital fossa    0.33                              +-----------------+-------------+----------+--------------+ Prox forearm         0.24                 branching    +-----------------+-------------+----------+--------------+ Mid forearm          0.14  branching    +-----------------+-------------+----------+--------------+ Distal forearm                          not visualized +-----------------+-------------+----------+--------------+ Wrist                                    not visualized +-----------------+-------------+----------+--------------+ *See table(s) above for measurements and observations.  Diagnosing physician: Ruta Hinds MD Electronically signed by Ruta Hinds MD on 10/04/2019 at 12:29:55 PM.    Final         Scheduled Meds: . amLODipine  10 mg Oral QHS  . aspirin EC  81 mg Oral Daily  . atorvastatin  40 mg Oral Daily  . carvedilol  25 mg Oral BID WC  . Chlorhexidine Gluconate Cloth  6 each Topical Q0600  . doxazosin  2 mg Oral QHS  . furosemide  80 mg Oral BID  . heparin injection (subcutaneous)  5,000 Units Subcutaneous Q8H  . heparin sodium (porcine)      . levothyroxine  25 mcg Oral Daily  . sodium bicarbonate  650 mg Oral BID  . sodium chloride flush  3 mL Intravenous Once  . sodium chloride flush  3 mL Intravenous Q12H   Continuous Infusions: . sodium chloride            Aline August, MD [facility] 10/05/2019, 8:15 AM

## 2019-10-05 NOTE — Progress Notes (Signed)
D/w pt right upper arm AV fistula.  Procedure details risk benefits.  She is on the OR schedule for Tuesday with Dr Ebony Cargo, MD [facility] [place] Office: [phone]

## 2019-10-06 LAB — GLUCOSE, CAPILLARY
Glucose-Capillary: 126 mg/dL — ABNORMAL HIGH (ref 70–99)
Glucose-Capillary: 107 mg/dL — ABNORMAL HIGH (ref 70–99)
Glucose-Capillary: 132 mg/dL — ABNORMAL HIGH (ref 70–99)
Glucose-Capillary: 145 mg/dL — ABNORMAL HIGH (ref 70–99)

## 2019-10-06 LAB — RENAL FUNCTION PANEL
Albumin: 1.9 g/dL — ABNORMAL LOW (ref 3.5–5.0)
Anion gap: 9 (ref 5–15)
BUN: 25 mg/dL — ABNORMAL HIGH (ref 8–23)
CO2: 23 mmol/L (ref 22–32)
Calcium: 9.3 mg/dL (ref 8.9–10.3)
Chloride: 107 mmol/L (ref 98–111)
Creatinine, Ser: 5.68 mg/dL — ABNORMAL HIGH (ref 0.44–1.00)
GFR calc Af Amer: 8 mL/min — ABNORMAL LOW (ref >60)
GFR calc non Af Amer: 7 mL/min — ABNORMAL LOW (ref >60)
Glucose, Bld: 133 mg/dL — ABNORMAL HIGH (ref 70–99)
Phosphorus: 6.5 mg/dL — ABNORMAL HIGH (ref 2.5–4.6)
Potassium: 3.8 mmol/L (ref 3.5–5.1)
Sodium: 139 mmol/L (ref 135–145)

## 2019-10-06 LAB — SARS CORONAVIRUS 2 BY RT PCR (HOSPITAL ORDER, PERFORMED IN ~~LOC~~ HOSPITAL LAB): SARS Coronavirus 2: NEGATIVE

## 2019-10-06 MED ORDER — COLCHICINE 0.6 MG PO TABS
0.6000 mg | ORAL_TABLET | Freq: Every day | ORAL | Status: DC
  Administered 2019-10-06: 0.6 mg via ORAL
  Filled 2019-10-06: qty 1

## 2019-10-06 MED ORDER — METHOCARBAMOL 500 MG PO TABS: 500.0000 mg | ORAL_TABLET | Freq: Four times a day (QID) | ORAL | Status: DC | PRN

## 2019-10-06 MED ORDER — HEPARIN SODIUM (PORCINE) 1000 UNIT/ML IJ SOLN
INTRAMUSCULAR | Status: AC
  Filled 2019-10-06: qty 4

## 2019-10-06 NOTE — Plan of Care (Signed)
  Problem: Education: Goal: Knowledge of General Education information will improve Description: Including pain rating scale, medication(s)/side effects and non-pharmacologic comfort measures Outcome: Progressing   Problem: Health Behavior/Discharge Planning: Goal: Ability to manage health-related needs will improve Outcome: Progressing   Problem: Clinical Measurements: Goal: Ability to maintain clinical measurements within normal limits will improve Outcome: Progressing Goal: Will remain free from infection Outcome: Progressing Goal: Diagnostic test results will improve Outcome: Progressing Goal: Respiratory complications will improve Outcome: Progressing Goal: Cardiovascular complication will be avoided Outcome: Progressing   Problem: Activity: Goal: Risk for activity intolerance will decrease Outcome: Progressing   Problem: Nutrition: Goal: Adequate nutrition will be maintained Outcome: Progressing   Problem: Coping: Goal: Level of anxiety will decrease Outcome: Progressing   Problem: Elimination: Goal: Will not experience complications related to bowel motility Outcome: Progressing   Problem: Pain Managment: Goal: General experience of comfort will improve Outcome: Progressing   

## 2019-10-06 NOTE — Progress Notes (Signed)
Pre op'd Patient for right upper extremity revision of AVF vs graft tomorrow 10/07/19. NPO after midnight. Morning Labs. Consent order placed

## 2019-10-06 NOTE — Progress Notes (Signed)
Renal Navigator met with patient at HD bedside to discuss OP HD referral and [facility] [place] transportation application. Referral in process. Transportation application completed and submitted. Navigator will follow up with patient once seat schedule has been obtained.  Alphonzo Cruise,  Renal Navigator [phone]

## 2019-10-06 NOTE — Progress Notes (Signed)
Renal Navigator submitted HepB surface antigen lab result to [facility] to complete referral. Navigator received message that [facility] is unable to accept new patients at this time, so referral is being sent to next closest clinic to patient's home, which [facility] to see if they have a seat for a new patient. Navigator will continue to follow closely.   Alphonzo Cruise,  Renal Navigator [phone]

## 2019-10-06 NOTE — Progress Notes (Signed)
Patient ID: Kristen English, female   DOB: 02-Apr-1955, 64 y.o.   MRN: [id number]  PROGRESS NOTE    Kristen English  MBW:[id number] DOB: 14-May-1955 DOA: 10/02/2019 PCP: Wenda Low, MD   Brief Narrative:  64 year old female with history of essential hypertension, diabetes mellitus type 2, chronic kidney disease stage V who was supposed to be started on peritoneal dialysis but was deferred because of incidental renal mass found on imaging, gout, recent discharge from hospital on 09/29/2019 for hypoglycemia was seen by PCP on 10/02/2019 for orthopnea and anasarca and was sent to the hospital for admission for possible initiation of hemodialysis.  In the ED, bicarb was 17 and creatinine was 9.1 which was almost the same from her recent creatinine on discharge.  Nephrology was consulted.  Assessment & Plan:   Chronic any disease stage V Worsening orthopnea and anasarca/fluid overload Non-anion gap metabolic acidosis -Nephrology following.  Status post right IJ tunneled HD catheter placement by IR on 10/03/2019.  Subsequently patient has been started on dialysis.  Dialysis as per nephrology schedule -Strict input and output.  Daily weights.  Oral Lasix and sodium bicarbonate discontinued on 10/05/2019 after getting approval from nephrology. -Vascular surgery has been consulted for permanent access placement: For probable right upper arm AV fistula placement on Tuesday.  Diabetes mellitus type 2 with recent episodes of hypoglycemia -Last A1c 5.4.  Continue CBGs with SSI.  Patient was recently hospitalized for episodes of hypoglycemia  Anemia of chronic disease -From renal failure.  Transfuse if hemoglobin is less than 7  Hypoalbuminemia -Nutrition consult  Essential hypertension -Blood pressure improving.  Monitor.  Continue amlodipine, Coreg, doxazosin.  Hypothyroidism-continue levothyroxine  Morbid obesity -Outpatient follow-up    DVT prophylaxis: Heparin Code Status: Full Family  Communication: Spoke to patient at bedside  disposition Plan: Status is: Inpatient  Remains inpatient appropriate because:Inpatient level of care appropriate due to severity of illness.  Patient needs permanent HD access placement and arrangement for outpatient hemodialysis unit.   Dispo: The patient is from: Home              Anticipated d/c is to: Home              Anticipated d/c date is: > 3 days              Patient currently is not medically stable to d/c.  Consultants: Nephrology/IR  Procedures: right IJ tunneled HD catheter placement by IR on 10/03/2019  Antimicrobials: None   Subjective: Patient seen and examined at bedside.  Denies overnight fever, vomiting, worsening shortness of breath.  No chest pain reported.  Complains of some right foot pain and some back pain.  Objective: Vitals:   10/05/19 0621 10/05/19 1325 10/05/19 2128 10/06/19 0428  BP: (!) 179/67 (!) 151/74 (!) 143/57 (!) 157/63  Pulse: (!) 101 91 86 98  Resp: 18 18 17 17   Temp: 99.3 F (37.4 C) 99.2 F (37.3 C) 98.8 F (37.1 C) 99.7 F (37.6 C)  TempSrc:  Oral Oral Oral  SpO2: (!) 89% 100% 99% 94%  Weight:      Height:        Intake/Output Summary (Last 24 hours) at 10/06/2019 0739 Last data filed at 10/06/2019 0052 Gross per 24 hour  Intake 850 ml  Output 600 ml  Net 250 ml   Filed Weights   10/02/19 1221 10/03/19 2250  Weight: (!) 138.8 kg (!) 139 kg    Examination:  General exam: No acute distress.  Looks older than stated age. Respiratory system: Bilateral decreased breath sounds at bases some crackles, no wheezing  cardiovascular system: S1 & S2 heard, currently rate controlled  gastrointestinal system: Abdomen is morbidly obese, nondistended, soft and nontender.  Bowel sounds are heard extremities: No cyanosis.  Trace bilateral lower extremity edema present   Data Reviewed: I have personally reviewed following labs and imaging studies  CBC: Recent Labs  Lab 10/02/19 1233  10/03/19 0153 10/04/19 0449 10/05/19 0540  WBC 7.5 6.8 6.4 6.3  NEUTROABS  --   --  4.7 4.2  HGB 8.5* 7.2* 7.2* 7.3*  HCT 30.3* 26.2* 25.0* 25.4*  MCV 95.6 97.0 93.6 93.4  PLT 152 131* 119* 366*   Basic Metabolic Panel: Recent Labs  Lab 10/02/19 1233 10/02/19 1233 10/03/19 0153 10/03/19 2225 10/04/19 0449 10/05/19 0540 10/06/19 0318  NA 141  --  141  --  142 139  139 139  K 4.6  --  4.8  --  3.5 3.2*  3.1* 3.8  CL 115*  --  117*  --  111 104  104 107  CO2 17*  --  15*  --  22 26  26 23   GLUCOSE 133*  --  102*  --  167* 109*  109* 133*  BUN 54*  --  60*  --  40* 18  19 25*  CREATININE 9.10*  --  9.40*  --  7.04* 3.98*  3.93* 5.68*  CALCIUM 9.8   < > 9.4 9.4 9.0 8.3*  8.2* 9.3  PHOS  --   --  9.8*  --  6.6* 3.6 6.5*   < > = values in this interval not displayed.   GFR: Estimated Creatinine Clearance: 14.8 mL/min (A) (by C-G formula based on SCr of 5.68 mg/dL (H)). Liver Function Tests: Recent Labs  Lab 10/03/19 0153 10/04/19 0449 10/05/19 0540 10/06/19 0318  AST  --   --  16  --   ALT  --   --  12  --   ALKPHOS  --   --  55  --   BILITOT  --   --  0.4  --   PROT  --   --  5.1*  --   ALBUMIN 1.8* 1.9* 2.0*  1.9* 1.9*   No results for input(s): LIPASE, AMYLASE in the last 168 hours. No results for input(s): AMMONIA in the last 168 hours. Coagulation Profile: No results for input(s): INR, PROTIME in the last 168 hours. Cardiac Enzymes: No results for input(s): CKTOTAL, CKMB, CKMBINDEX, TROPONINI in the last 168 hours. BNP (last 3 results) No results for input(s): PROBNP in the last 8760 hours. HbA1C: No results for input(s): HGBA1C in the last 72 hours. CBG: Recent Labs  Lab 10/05/19 0724 10/05/19 1148 10/05/19 1644 10/05/19 2128 10/06/19 0646  GLUCAP 100* 160* 171* 168* 126*   Lipid Profile: No results for input(s): CHOL, HDL, LDLCALC, TRIG, CHOLHDL, LDLDIRECT in the last 72 hours. Thyroid Function Tests: No results for input(s): TSH,  T4TOTAL, FREET4, T3FREE, THYROIDAB in the last 72 hours. Anemia Panel: Recent Labs    10/03/19 2225  FERRITIN 618*  TIBC 132*  IRON 53   Sepsis Labs: No results for input(s): PROCALCITON, LATICACIDVEN in the last 168 hours.  Recent Results (from the past 240 hour(s))  SARS Coronavirus 2 by RT PCR (hospital order, performed in [facility] hospital lab) Nasopharyngeal Nasopharyngeal Swab     Status: None   Collection Time: 09/27/19  9:45 PM  Specimen: Nasopharyngeal Swab  Result Value Ref Range Status   SARS Coronavirus 2 NEGATIVE NEGATIVE Final    Comment: (NOTE) SARS-CoV-2 target nucleic acids are NOT DETECTED.  The SARS-CoV-2 RNA is generally detectable in upper and lower respiratory specimens during the acute phase of infection. The lowest concentration of SARS-CoV-2 viral copies this assay can detect is 250 copies / mL. A negative result does not preclude SARS-CoV-2 infection and should not be used as the sole basis for treatment or other patient management decisions.  A negative result may occur with improper specimen collection / handling, submission of specimen other than nasopharyngeal swab, presence of viral mutation(s) within the areas targeted by this assay, and inadequate number of viral copies (<250 copies / mL). A negative result must be combined with clinical observations, patient history, and epidemiological information.  Fact Sheet for Patients:   StrictlyIdeas.no  Fact Sheet for Healthcare Providers: BankingDealers.co.za  This test is not yet approved or  cleared by the [place] FDA and has been authorized for detection and/or diagnosis of SARS-CoV-2 by FDA under an Emergency Use Authorization (EUA).  This EUA will remain in effect (meaning this test can be used) for the duration of the COVID-19 declaration under Section 564(b)(1) of the Act, 21 U.S.C. section 360bbb-3(b)(1), unless the authorization is  terminated or revoked sooner.  Performed at [facility], [place] [address] [place], [place] [address]          Radiology Studies: VAS [place] UPPER EXT VEIN MAPPING (PRE-OP AVF)  Result Date: 10/04/2019 UPPER EXTREMITY VEIN MAPPING  Indications: Pre-access. Comparison Study: No prior studies. Performing Technologist: Darlin Coco Supporting Technologist: Sharion Dove RVS  Examination Guidelines: A complete evaluation includes B-mode imaging, spectral Doppler, color Doppler, and power Doppler as needed of all accessible portions of each vessel. Bilateral testing is considered an integral part of a complete examination. Limited examinations for reoccurring indications may be performed as noted. +-----------------+-------------+----------+--------------+ Right Cephalic   Diameter (cm)Depth (cm)   Findings    +-----------------+-------------+----------+--------------+ Shoulder             0.47        2.27                  +-----------------+-------------+----------+--------------+ Prox upper arm       0.47        1.81                  +-----------------+-------------+----------+--------------+ Mid upper arm        0.37        2.00     branching    +-----------------+-------------+----------+--------------+ Dist upper arm       0.46        1.60                  +-----------------+-------------+----------+--------------+ Antecubital fossa    1.12        0.47                  +-----------------+-------------+----------+--------------+ Prox forearm         0.45        1.22     branching    +-----------------+-------------+----------+--------------+ Mid forearm          0.26        0.85     branching    +-----------------+-------------+----------+--------------+ Dist forearm         0.22        0.77                  +-----------------+-------------+----------+--------------+  Wrist                                   not visualized  +-----------------+-------------+----------+--------------+ +-----------------+-------------+----------+--------------+ Right Basilic    Diameter (cm)Depth (cm)   Findings    +-----------------+-------------+----------+--------------+ Prox upper arm       0.36                              +-----------------+-------------+----------+--------------+ Mid upper arm        0.32                              +-----------------+-------------+----------+--------------+ Dist upper arm       0.30                 branching    +-----------------+-------------+----------+--------------+ Antecubital fossa    0.30                 branching    +-----------------+-------------+----------+--------------+ Prox forearm         0.15                              +-----------------+-------------+----------+--------------+ Mid forearm          0.11                              +-----------------+-------------+----------+--------------+ Distal forearm       0.11                              +-----------------+-------------+----------+--------------+ Wrist                                   not visualized +-----------------+-------------+----------+--------------+ +-----------------+-------------+----------+------------------------------+ Left Cephalic    Diameter (cm)Depth (cm)           Findings            +-----------------+-------------+----------+------------------------------+ Shoulder                                        not visualized         +-----------------+-------------+----------+------------------------------+ Prox upper arm       0.16        0.17                                  +-----------------+-------------+----------+------------------------------+ Mid upper arm        0.27        1.60             branching            +-----------------+-------------+----------+------------------------------+ Dist upper arm       0.24        1.29             branching             +-----------------+-------------+----------+------------------------------+ Antecubital fossa    0.47        0.49                                  +-----------------+-------------+----------+------------------------------+  Prox forearm         0.26        0.63                                  +-----------------+-------------+----------+------------------------------+ Mid forearm          0.20        0.54   branching and partial thrombus +-----------------+-------------+----------+------------------------------+ Dist forearm         0.18        0.59                                  +-----------------+-------------+----------+------------------------------+ Wrist                0.20        0.69                                  +-----------------+-------------+----------+------------------------------+ +-----------------+-------------+----------+--------------+ Left Basilic     Diameter (cm)Depth (cm)   Findings    +-----------------+-------------+----------+--------------+ Dist upper arm       0.28                   Origin     +-----------------+-------------+----------+--------------+ Antecubital fossa    0.33                              +-----------------+-------------+----------+--------------+ Prox forearm         0.24                 branching    +-----------------+-------------+----------+--------------+ Mid forearm          0.14                 branching    +-----------------+-------------+----------+--------------+ Distal forearm                          not visualized +-----------------+-------------+----------+--------------+ Wrist                                   not visualized +-----------------+-------------+----------+--------------+ *See table(s) above for measurements and observations.  Diagnosing physician: Ruta Hinds MD Electronically signed by Ruta Hinds MD on 10/04/2019 at 12:29:55 PM.    Final         Scheduled  Meds: . amLODipine  10 mg Oral QHS  . aspirin EC  81 mg Oral Daily  . atorvastatin  40 mg Oral Daily  . carvedilol  25 mg Oral BID WC  . Chlorhexidine Gluconate Cloth  6 each Topical Q0600  . doxazosin  2 mg Oral QHS  . heparin injection (subcutaneous)  5,000 Units Subcutaneous Q8H  . levothyroxine  25 mcg Oral Daily  . sodium chloride flush  3 mL Intravenous Once  . sodium chloride flush  3 mL Intravenous Q12H   Continuous Infusions: . sodium chloride            Aline August, MD [facility] 10/06/2019, 7:39 AM

## 2019-10-06 NOTE — Progress Notes (Addendum)
Admit: 10/02/2019 LOS: 4  6F new start HD, uremia  Subjective:   Seen on HD today, HD#3 today.Still has LEE, no other complaints. For AVF 7/20  qbf 3110ml/min, uf goal 3.5L, rij tdc  I was present at this dialysis session. I have reviewed the session itself and made appropriate changes.   Objective:  Hemodialysis Rx: 3 hours Bath: 3K, 2.5Cal, 137Na Dialyzer F180 BFR 300cc/min, DFR 500 cc/min UF goal 1-2L as tolerated  07/18 0701 - 07/19 0700 In: 850 [P.O.:850] Out: 600 [Urine:600]  Filed Weights   10/02/19 1221 10/03/19 2250  Weight: (!) 138.8 kg (!) 139 kg    Scheduled Meds:  amLODipine  10 mg Oral QHS   aspirin EC  81 mg Oral Daily   atorvastatin  40 mg Oral Daily   carvedilol  25 mg Oral BID WC   Chlorhexidine Gluconate Cloth  6 each Topical Q0600   doxazosin  2 mg Oral QHS   heparin injection (subcutaneous)  5,000 Units Subcutaneous Q8H   levothyroxine  25 mcg Oral Daily   sodium chloride flush  3 mL Intravenous Once   sodium chloride flush  3 mL Intravenous Q12H   Continuous Infusions:  sodium chloride     PRN Meds:.sodium chloride, acetaminophen **OR** acetaminophen, ondansetron **OR** ondansetron (ZOFRAN) IV, polyethylene glycol, sodium chloride flush  Current Labs: reviewed  Results for AGNESS, SIBRIAN (MRN [id number]) as of 10/04/2019 10:58  Ref. Range 10/03/2019 22:25  Saturation Ratios Latest Ref Range: 10.4 - 31.8 % 40 (H)  Ferritin Latest Ref Range: 11 - 307 ng/mL 618 (H)   Results for ARLETTA, LUMADUE (MRN [id number]) as of 10/04/2019 10:58  Ref. Range 10/02/2019 20:15  PTH, Intact Latest Ref Range: 15 - 65 pg/mL 337 (H)   Recent Labs  Lab 10/04/19 0449 10/05/19 0540 10/06/19 0318  NA 142 139   139 139  K 3.5 3.2*   3.1* 3.8  CL 111 104   104 107  CO2 22 26   26 23   GLUCOSE 167* 109*   109* 133*  BUN 40* 18   19 25*  CREATININE 7.04* 3.98*   3.93* 5.68*  CALCIUM 9.0 8.3*   8.2* 9.3  PHOS 6.6* 3.6 6.5*   Recent Labs  Lab  10/03/19 0153 10/04/19 0449 10/05/19 0540  WBC 6.8 6.4 6.3  NEUTROABS  --  4.7 4.2  HGB 7.2* 7.2* 7.3*  HCT 26.2* 25.0* 25.4*  MCV 97.0 93.6 93.4  PLT 131* 119* 122*     Physical Exam:  Blood pressure (!) 157/63, pulse 98, temperature 99.7 F (37.6 C), temperature source Oral, resp. rate 17, height 5' 8.5" (1.74 m), weight (!) 139 kg, SpO2 94 %. Gen: NAD CV: RRR with 2/6 MSM, no rub Resp: CTAB, normal WOB, unlabored, bilateral chest expansion Ext: 1+ pitting edema bilateral LE's Neuro: speech clear and coherent, aaox3, Nonfocal exam Abd: soft, nt/nd HEENT: EOMI, anicteric sclera  A 1. New ESRD with uremia; plan was for PD but put on hold 2/2 #2; started HD 7/16 with [facility]; [facility] following for AVF planned 7/20 2. 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 DM2 4. Anemia, Hb 7s: fe replete 5. Gout  6. HTN 7. NAGMA 2/2 #1 8. CKD-BMD, hyperphosphatemia, no binders currently, trending  P  HD #3 today: 3h, 3.5L UF max, 3K bath, TDC, tight hep. Will plan for running her on dialysis again on Wed (plan for 4hr treatment with increased BFR as tolerated  to 475mL/min). Will maintain on MWF schedule  Plan for AVF 7/20  With problem #2, hold ESA For now, Fe replete, transfuse prn  Renal diet  Medication Issues; o Preferred narcotic agents for pain control are hydromorphone, fentanyl, and methadone. Morphine should not be used.  o Baclofen should be avoided o Avoid oral sodium phosphate and magnesium citrate based laxatives / bowel preps   Gean Quint, MD [place]

## 2019-10-07 ENCOUNTER — Other Ambulatory Visit: Payer: Self-pay

## 2019-10-07 ENCOUNTER — Encounter (HOSPITAL_COMMUNITY)
Admission: EM | Disposition: A | Discharge status: Home / Self Care | Payer: Self-pay | Source: Home / Self Care | Attending: Internal Medicine

## 2019-10-07 ENCOUNTER — Inpatient Hospital Stay (HOSPITAL_COMMUNITY): Payer: Medicare HMO | Admitting: Certified Registered"

## 2019-10-07 ENCOUNTER — Encounter (HOSPITAL_COMMUNITY): Payer: Self-pay | Admitting: Internal Medicine

## 2019-10-07 DIAGNOSIS — Z992 Dependence on renal dialysis: Secondary | ICD-10-CM | POA: Diagnosis not present

## 2019-10-07 DIAGNOSIS — T82898A Other specified complication of vascular prosthetic devices, implants and grafts, initial encounter: Secondary | ICD-10-CM | POA: Diagnosis not present

## 2019-10-07 DIAGNOSIS — N186 End stage renal disease: Secondary | ICD-10-CM | POA: Diagnosis not present

## 2019-10-07 HISTORY — PX: AV FISTULA PLACEMENT: SHX1204

## 2019-10-07 LAB — GLUCOSE, CAPILLARY
Glucose-Capillary: 116 mg/dL — ABNORMAL HIGH (ref 70–99)
Glucose-Capillary: 113 mg/dL — ABNORMAL HIGH (ref 70–99)
Glucose-Capillary: 101 mg/dL — ABNORMAL HIGH (ref 70–99)
Glucose-Capillary: 131 mg/dL — ABNORMAL HIGH (ref 70–99)

## 2019-10-07 LAB — HEMOGLOBIN AND HEMATOCRIT, BLOOD
HCT: 29 % — ABNORMAL LOW (ref 36.0–46.0)
Hemoglobin: 8.1 g/dL — ABNORMAL LOW (ref 12.0–15.0)

## 2019-10-07 LAB — CBC
HCT: 24.7 % — ABNORMAL LOW (ref 36.0–46.0)
Hemoglobin: 6.8 g/dL — CL (ref 12.0–15.0)
MCH: 26.7 pg (ref 26.0–34.0)
MCHC: 27.5 g/dL — ABNORMAL LOW (ref 30.0–36.0)
MCV: 96.9 fL (ref 80.0–100.0)
Platelets: 106 10*3/uL — ABNORMAL LOW (ref 150–400)
RBC: 2.55 MIL/uL — ABNORMAL LOW (ref 3.87–5.11)
RDW: 15.4 % (ref 11.5–15.5)
WBC: 6.5 10*3/uL (ref 4.0–10.5)
nRBC: 0 % (ref 0.0–0.2)

## 2019-10-07 LAB — SURGICAL PCR SCREEN
MRSA, PCR: NEGATIVE
Staphylococcus aureus: NEGATIVE

## 2019-10-07 LAB — RENAL FUNCTION PANEL
Albumin: 1.8 g/dL — ABNORMAL LOW (ref 3.5–5.0)
Anion gap: 5 (ref 5–15)
BUN: 14 mg/dL (ref 8–23)
CO2: 29 mmol/L (ref 22–32)
Calcium: 9.3 mg/dL (ref 8.9–10.3)
Chloride: 106 mmol/L (ref 98–111)
Creatinine, Ser: 4.46 mg/dL — ABNORMAL HIGH (ref 0.44–1.00)
GFR calc Af Amer: 11 mL/min — ABNORMAL LOW (ref >60)
GFR calc non Af Amer: 10 mL/min — ABNORMAL LOW (ref >60)
Glucose, Bld: 131 mg/dL — ABNORMAL HIGH (ref 70–99)
Phosphorus: 5.4 mg/dL — ABNORMAL HIGH (ref 2.5–4.6)
Potassium: 3.5 mmol/L (ref 3.5–5.1)
Sodium: 140 mmol/L (ref 135–145)

## 2019-10-07 LAB — PREPARE RBC (CROSSMATCH)

## 2019-10-07 LAB — HEPATITIS B SURFACE ANTIBODY,QUALITATIVE: Hep B S Ab: NONREACTIVE

## 2019-10-07 LAB — HEPATITIS B CORE ANTIBODY, TOTAL: Hep B Core Total Ab: NONREACTIVE

## 2019-10-07 SURGERY — ARTERIOVENOUS (AV) FISTULA CREATION
Anesthesia: General | Laterality: Right

## 2019-10-07 MED ORDER — 0.9 % SODIUM CHLORIDE (POUR BTL) OPTIME
TOPICAL | Status: DC | PRN
  Administered 2019-10-07: 1000 mL

## 2019-10-07 MED ORDER — SUCCINYLCHOLINE CHLORIDE 20 MG/ML IJ SOLN
INTRAMUSCULAR | Status: DC | PRN
Start: 2019-10-07 — End: 2019-10-07
  Administered 2019-10-07: 60 mg via INTRAVENOUS
  Administered 2019-10-07: 120 mg via INTRAVENOUS

## 2019-10-07 MED ORDER — DEXAMETHASONE SODIUM PHOSPHATE 10 MG/ML IJ SOLN
INTRAMUSCULAR | Status: AC
  Filled 2019-10-07: qty 1

## 2019-10-07 MED ORDER — SODIUM CHLORIDE 0.9 % IV SOLN: INTRAVENOUS | Status: DC

## 2019-10-07 MED ORDER — FENTANYL CITRATE (PF) 250 MCG/5ML IJ SOLN
INTRAMUSCULAR | Status: AC
  Filled 2019-10-07: qty 5

## 2019-10-07 MED ORDER — LIDOCAINE 2% (20 MG/ML) 5 ML SYRINGE
INTRAMUSCULAR | Status: AC
  Filled 2019-10-07: qty 5

## 2019-10-07 MED ORDER — PHENYLEPHRINE 40 MCG/ML (10ML) SYRINGE FOR IV PUSH (FOR BLOOD PRESSURE SUPPORT)
PREFILLED_SYRINGE | INTRAVENOUS | Status: DC | PRN
  Administered 2019-10-07 (×2): 80 ug via INTRAVENOUS
  Administered 2019-10-07: 40 ug via INTRAVENOUS
  Administered 2019-10-07: 80 ug via INTRAVENOUS
  Administered 2019-10-07: 120 ug via INTRAVENOUS

## 2019-10-07 MED ORDER — PHENYLEPHRINE 40 MCG/ML (10ML) SYRINGE FOR IV PUSH (FOR BLOOD PRESSURE SUPPORT)
PREFILLED_SYRINGE | INTRAVENOUS | Status: AC
  Filled 2019-10-07: qty 10

## 2019-10-07 MED ORDER — LIDOCAINE-EPINEPHRINE (PF) 1 %-1:200000 IJ SOLN
INTRAMUSCULAR | Status: AC
  Filled 2019-10-07: qty 30

## 2019-10-07 MED ORDER — ROCURONIUM BROMIDE 100 MG/10ML IV SOLN
INTRAVENOUS | Status: DC | PRN
Start: 2019-10-07 — End: 2019-10-07
  Administered 2019-10-07: 20 mg via INTRAVENOUS

## 2019-10-07 MED ORDER — ACETAMINOPHEN 500 MG PO TABS
1000.0000 mg | ORAL_TABLET | Freq: Once | ORAL | Status: AC
  Administered 2019-10-07: 1000 mg via ORAL
  Filled 2019-10-07: qty 2

## 2019-10-07 MED ORDER — OXYCODONE HCL 5 MG PO TABS: 5.0000 mg | ORAL_TABLET | ORAL | 0 refills | Status: DC | PRN

## 2019-10-07 MED ORDER — PHENYLEPHRINE HCL-NACL 10-0.9 MG/250ML-% IV SOLN
INTRAVENOUS | Status: DC | PRN
  Administered 2019-10-07: 50 ug/min via INTRAVENOUS

## 2019-10-07 MED ORDER — SODIUM CHLORIDE 0.9% IV SOLUTION: Freq: Once | INTRAVENOUS | Status: DC

## 2019-10-07 MED ORDER — SUGAMMADEX SODIUM 200 MG/2ML IV SOLN
INTRAVENOUS | Status: DC | PRN
  Administered 2019-10-07 (×2): 100 mg via INTRAVENOUS

## 2019-10-07 MED ORDER — DEXAMETHASONE SODIUM PHOSPHATE 10 MG/ML IJ SOLN
INTRAMUSCULAR | Status: DC | PRN
  Administered 2019-10-07: 5 mg via INTRAVENOUS

## 2019-10-07 MED ORDER — PROTAMINE SULFATE 10 MG/ML IV SOLN
INTRAVENOUS | Status: DC | PRN
  Administered 2019-10-07: 40 mg via INTRAVENOUS

## 2019-10-07 MED ORDER — GLYCOPYRROLATE 0.2 MG/ML IJ SOLN
INTRAMUSCULAR | Status: DC | PRN
Start: 2019-10-07 — End: 2019-10-07
  Administered 2019-10-07: .2 mg via INTRAVENOUS

## 2019-10-07 MED ORDER — LIDOCAINE-EPINEPHRINE (PF) 1 %-1:200000 IJ SOLN
INTRAMUSCULAR | Status: DC | PRN
  Administered 2019-10-07: 40 mL

## 2019-10-07 MED ORDER — SUCCINYLCHOLINE CHLORIDE 200 MG/10ML IV SOSY
PREFILLED_SYRINGE | INTRAVENOUS | Status: AC
  Filled 2019-10-07: qty 10

## 2019-10-07 MED ORDER — MIDAZOLAM HCL 2 MG/2ML IJ SOLN
INTRAMUSCULAR | Status: AC
  Filled 2019-10-07: qty 2

## 2019-10-07 MED ORDER — ROCURONIUM BROMIDE 10 MG/ML (PF) SYRINGE
PREFILLED_SYRINGE | INTRAVENOUS | Status: AC
  Filled 2019-10-07: qty 10

## 2019-10-07 MED ORDER — HEPARIN SODIUM (PORCINE) 1000 UNIT/ML IJ SOLN
INTRAMUSCULAR | Status: AC
  Filled 2019-10-07: qty 1

## 2019-10-07 MED ORDER — HEPARIN SODIUM (PORCINE) 1000 UNIT/ML IJ SOLN
INTRAMUSCULAR | Status: DC | PRN
  Administered 2019-10-07: 10000 [IU] via INTRAVENOUS

## 2019-10-07 MED ORDER — PROPOFOL 10 MG/ML IV BOLUS
INTRAVENOUS | Status: DC | PRN
  Administered 2019-10-07: 200 mg via INTRAVENOUS

## 2019-10-07 MED ORDER — LIDOCAINE HCL (PF) 1 % IJ SOLN
INTRAMUSCULAR | Status: AC
  Filled 2019-10-07: qty 30

## 2019-10-07 MED ORDER — CEFAZOLIN SODIUM-DEXTROSE 2-4 GM/100ML-% IV SOLN: 2.0000 g | Freq: Once | INTRAVENOUS | Status: DC

## 2019-10-07 MED ORDER — SODIUM CHLORIDE 0.9 % IV SOLN
INTRAVENOUS | Status: DC | PRN
  Administered 2019-10-07: 500 mL

## 2019-10-07 MED ORDER — PAPAVERINE HCL 30 MG/ML IJ SOLN
INTRAMUSCULAR | Status: AC
  Filled 2019-10-07: qty 2

## 2019-10-07 MED ORDER — ONDANSETRON HCL 4 MG/2ML IJ SOLN
INTRAMUSCULAR | Status: DC | PRN
  Administered 2019-10-07: 4 mg via INTRAVENOUS

## 2019-10-07 MED ORDER — DEXTROSE 5 % IV SOLN
3.0000 g | Freq: Once | INTRAVENOUS | Status: AC
  Administered 2019-10-07: 3 g via INTRAVENOUS
  Filled 2019-10-07: qty 3

## 2019-10-07 MED ORDER — ONDANSETRON HCL 4 MG/2ML IJ SOLN
INTRAMUSCULAR | Status: AC
  Filled 2019-10-07: qty 2

## 2019-10-07 MED ORDER — FENTANYL CITRATE (PF) 250 MCG/5ML IJ SOLN
INTRAMUSCULAR | Status: DC | PRN
  Administered 2019-10-07: 50 ug via INTRAVENOUS

## 2019-10-07 MED ORDER — SODIUM CHLORIDE 0.9 % IV SOLN
INTRAVENOUS | Status: AC
  Filled 2019-10-07: qty 1.2

## 2019-10-07 MED ORDER — PROPOFOL 10 MG/ML IV BOLUS
INTRAVENOUS | Status: AC
  Filled 2019-10-07: qty 20

## 2019-10-07 MED ORDER — COLCHICINE 0.6 MG PO TABS: 0.6000 mg | ORAL_TABLET | ORAL | Status: AC | PRN

## 2019-10-07 MED ORDER — CHLORHEXIDINE GLUCONATE 0.12 % MT SOLN
15.0000 mL | Freq: Once | OROMUCOSAL | Status: AC
  Administered 2019-10-07: 15 mL via OROMUCOSAL
  Filled 2019-10-07: qty 15

## 2019-10-07 MED ORDER — LIDOCAINE 2% (20 MG/ML) 5 ML SYRINGE
INTRAMUSCULAR | Status: DC | PRN
Start: 2019-10-07 — End: 2019-10-07
  Administered 2019-10-07: 60 mg via INTRAVENOUS

## 2019-10-07 SURGICAL SUPPLY — 36 items
ADH SKN CLS APL DERMABOND .7 (GAUZE/BANDAGES/DRESSINGS) ×1
ARMBAND PINK RESTRICT EXTREMIT (MISCELLANEOUS) ×3 IMPLANT
CANISTER SUCT 3000ML PPV (MISCELLANEOUS) ×3 IMPLANT
CANNULA VESSEL 3MM 2 BLNT TIP (CANNULA) ×3 IMPLANT
CLIP VESOCCLUDE MED 6/CT (CLIP) ×3 IMPLANT
CLIP VESOCCLUDE SM WIDE 6/CT (CLIP) ×5 IMPLANT
COVER PROBE W GEL 5X96 (DRAPES) ×1 IMPLANT
COVER WAND RF STERILE (DRAPES) ×1 IMPLANT
DECANTER SPIKE VIAL GLASS SM (MISCELLANEOUS) ×3 IMPLANT
DERMABOND ADVANCED (GAUZE/BANDAGES/DRESSINGS) ×2
DERMABOND ADVANCED .7 DNX12 (GAUZE/BANDAGES/DRESSINGS) ×1 IMPLANT
ELECT REM PT RETURN 9FT ADLT (ELECTROSURGICAL) ×3
ELECTRODE REM PT RTRN 9FT ADLT (ELECTROSURGICAL) ×1 IMPLANT
GLOVE BIO SURGEON STRL SZ7.5 (GLOVE) ×3 IMPLANT
GLOVE BIOGEL PI IND STRL 6.5 (GLOVE) IMPLANT
GLOVE BIOGEL PI IND STRL 8 (GLOVE) ×1 IMPLANT
GLOVE BIOGEL PI INDICATOR 6.5 (GLOVE) ×2
GLOVE BIOGEL PI INDICATOR 8 (GLOVE) ×2
GLOVE SS BIOGEL STRL SZ 6.5 (GLOVE) IMPLANT
GLOVE SUPERSENSE BIOGEL SZ 6.5 (GLOVE) ×2
GOWN STRL REUS W/ TWL LRG LVL3 (GOWN DISPOSABLE) ×3 IMPLANT
GOWN STRL REUS W/TWL LRG LVL3 (GOWN DISPOSABLE) ×9
KIT BASIN OR (CUSTOM PROCEDURE TRAY) ×3 IMPLANT
KIT TURNOVER KIT B (KITS) ×3 IMPLANT
NS IRRIG 1000ML POUR BTL (IV SOLUTION) ×3 IMPLANT
PACK CV ACCESS (CUSTOM PROCEDURE TRAY) ×3 IMPLANT
PAD ARMBOARD 7.5X6 YLW CONV (MISCELLANEOUS) ×6 IMPLANT
SPONGE SURGIFOAM ABS GEL 100 (HEMOSTASIS) IMPLANT
SUT PROLENE 6 0 BV (SUTURE) ×3 IMPLANT
SUT VIC AB 3-0 SH 27 (SUTURE) ×6
SUT VIC AB 3-0 SH 27X BRD (SUTURE) ×1 IMPLANT
SUT VIC AB 4-0 PS2 18 (SUTURE) ×2 IMPLANT
SUT VICRYL 4-0 PS2 18IN ABS (SUTURE) ×3 IMPLANT
TOWEL GREEN STERILE (TOWEL DISPOSABLE) ×3 IMPLANT
UNDERPAD 30X36 HEAVY ABSORB (UNDERPADS AND DIAPERS) ×3 IMPLANT
WATER STERILE IRR 1000ML POUR (IV SOLUTION) ×3 IMPLANT

## 2019-10-07 NOTE — Interval H&P Note (Signed)
History and Physical Interval Note:  10/07/2019 11:05 AM  Kristen English  has presented today for surgery, with the diagnosis of END STAGE RENAL FAILURE.  The various methods of treatment have been discussed with the patient and family. After consideration of risks, benefits and other options for treatment, the patient has consented to  Procedure(s): ARM FISTULA SUPERFICIALIZATION VS. GRAFT (Right) as a surgical intervention.  The patient's history has been reviewed, patient examined, no change in status, stable for surgery.  I have reviewed the patient's chart and labs.  Questions were answered to the patient's satisfaction.     Deitra Mayo

## 2019-10-07 NOTE — Op Note (Signed)
    NAME: Kristen English    MRN: [id number] DOB: Sep 27, 1955    DATE OF OPERATION: 10/07/2019  PREOP DIAGNOSIS:    End-stage renal disease  POSTOP DIAGNOSIS:    Same  PROCEDURE:    1. Right brachiocephalic AV fistula 2. Superficialization of right brachiocephalic AV fistula  SURGEON: Judeth Cornfield. Scot Dock, MD  ASSIST: Risa Grill, PA  ANESTHESIA: General  EBL: Minimal  INDICATIONS:    Kristen English is a 64 y.o. female who presents for new access.  Excellent thrill in upper arm fistula at the completion of the procedure.  FINDINGS:   3 and half millimeter upper arm cephalic vein.  The vein was quite deep so I superficialized the fistula.  TECHNIQUE:   The patient was taken to the operating room and received a general anesthetic.  The right arm was prepped and draped in the usual sterile fashion.  A transverse incision was made just above the antecubital level.  Here the cephalic vein was dissected free.  Branches were divided between clips and 3-0 silk ties.  The brachial artery was dissected free beneath the fascia.  The patient was heparinized.  The brachial artery was clamped proximally and distally and a longitudinal arteriotomy was made.  The vein was ligated distally and slightly spatulated.  This was then sewn end-to-side to the brachial artery using continuous 6-0 Prolene suture.  At the completion there was a good thrill in the fistula.  Hemostasis was obtained in the wound.  The vein was quite deep I therefore fully mobilized the vein.  I made 1 incision in the mid upper arm so that I could mobilize the vein circumferentially all the way up to the shoulder.  I also debrided a large amount of adipose tissue overlying the vein through these 2 incisions.  Hemostasis was obtained in the wounds.  The incision at the antecubital level was closed with a deep layer of 3-0 Vicryl and the skin closed with 4-0 Vicryl.  The incision in the mid upper arm was closed with a 4-0  Vicryl.  Dermabond was applied.  Patient tolerated the procedure well was transferred to the recovery room in stable condition.  All needle and sponge counts were correct.  Given the complexity of the case a first assistant was necessary in order to expedient the procedure and safely perform the technical aspects of the operation.  Deitra Mayo, MD, FACS [facility]  DATE OF DICTATION:   10/07/2019

## 2019-10-07 NOTE — Progress Notes (Signed)
Renal Navigator received call from [facility] stating they do have a seat available and can accept patient today to start on Thursday, 10/09/19. Navigator met with patient to inform of clinic and schedule, which is TTS, 12:40pm. She needs to arrive to her appointments at 12:20pm. On her first day, she needs to arrive at 11:30am to complete intake paperwork prior to her first treatment. Patient states understanding and appreciation. Dr. Singh/Nephrology informed, as well as Primary/Dr. Starla Link, who plans for discharge today after AVF surgery if cleared by surgeon. Per Dr. Candiss Norse, patient does not need another HD in hospital prior to discharge and next treatment can be Thursday at [facility].  Renal Navigator notified clinic and asked Renal PA to send OP HD orders to clinic.  Navigator has not received confirmation of [facility] [place] approval for transportation yet, but patient states her daughter will take her to her first appointment and she can arrange transportation starting for her appointment Saturday.  Alphonzo Cruise, [place] Renal Navigator [phone]

## 2019-10-07 NOTE — Anesthesia Postprocedure Evaluation (Signed)
Anesthesia Post Note  Patient: Kristen English  Procedure(s) Performed: ARTERIOVENOUS (AV) FISTULA CREATION RIGHT UPPER ARM (Right )     Patient location during evaluation: PACU Anesthesia Type: General Level of consciousness: awake and alert Pain management: pain level controlled Vital Signs Assessment: post-procedure vital signs reviewed and stable Respiratory status: spontaneous breathing, nonlabored ventilation, respiratory function stable and patient connected to nasal cannula oxygen Cardiovascular status: blood pressure returned to baseline and stable Postop Assessment: no apparent nausea or vomiting Anesthetic complications: no   Encounter Complications  Complication Outcome Phase Comment  Airway obstruction requiring support  Intraprocedure     Last Vitals:  Vitals:   10/07/19 1605 10/07/19 1624  BP: (!) 141/64 132/70  Pulse: 87 78  Resp: 15 16  Temp: 36.7 C 36.7 C  SpO2: 96% 94%    Last Pain:  Vitals:   10/07/19 1624  TempSrc: Oral  PainSc:                  Tiajuana Amass

## 2019-10-07 NOTE — Transfer of Care (Signed)
Immediate Anesthesia Transfer of Care Note  Patient: Kristen English  Procedure(s) Performed: ARTERIOVENOUS (AV) FISTULA CREATION RIGHT UPPER ARM (Right )  Patient Location: PACU  Anesthesia Type:General  Level of Consciousness: awake and oriented  Airway & Oxygen Therapy: Patient Spontanous Breathing and Patient connected to face mask oxygen  Post-op Assessment: Report given to RN  Post vital signs: Reviewed and unstable  Last Vitals:  Vitals Value Taken Time  BP 100/82 10/07/19 1420  Temp    Pulse 96 10/07/19 1425  Resp 22 10/07/19 1425  SpO2 93 % 10/07/19 1425  Vitals shown include unvalidated device data.  Last Pain:  Vitals:   10/07/19 0940  TempSrc: Oral  PainSc:          Complications:  Encounter Complications  Complication Outcome Phase Comment  Airway obstruction requiring support  Intraprocedure

## 2019-10-07 NOTE — Progress Notes (Signed)
PT Cancellation Note  Patient Details Name: LILLIONA BLAKENEY MRN: [id number] DOB: 07/27/1955   Cancelled Treatment:    Reason Eval/Treat Not Completed: Patient at procedure or test/unavailable Pt off floor in OR.Will follow.   Marguarite Arbour A Kasra Melvin 10/07/2019, 11:07 AM Marisa Severin, PT, DPT Acute Rehabilitation Services Pager [phone] Office [phone]

## 2019-10-07 NOTE — Progress Notes (Signed)
Admit: 10/02/2019 LOS: 5  70F new start HD, uremia  Subjective:  . Just post op for right upper extremity brachiocephalic AV fistula which was uncomplicated.  Drowsy/lethargic on my encounter due to the fact that she is post sedation.  Received 1 unit PRBC for hemoglobin 6.8 this morning  Objective:  07/19 0701 - 07/20 0700 In: 570 [P.O.:570] Out: 3000   Filed Weights   10/03/19 2250 10/06/19 0719 10/06/19 1104  Weight: (!) 139 kg 134.8 kg 131.6 kg    Scheduled Meds: . [MAR Hold] sodium chloride   Intravenous Once  . [MAR Hold] amLODipine  10 mg Oral QHS  . [MAR Hold] aspirin EC  81 mg Oral Daily  . [MAR Hold] atorvastatin  40 mg Oral Daily  . [MAR Hold] carvedilol  25 mg Oral BID WC  . [MAR Hold] Chlorhexidine Gluconate Cloth  6 each Topical Q0600  . [MAR Hold] doxazosin  2 mg Oral QHS  . [MAR Hold] heparin injection (subcutaneous)  5,000 Units Subcutaneous Q8H  . [MAR Hold] levothyroxine  25 mcg Oral Daily  . [MAR Hold] sodium chloride flush  3 mL Intravenous Once  . [MAR Hold] sodium chloride flush  3 mL Intravenous Q12H   Continuous Infusions: . [MAR Hold] sodium chloride    . sodium chloride     PRN Meds:.[MAR Hold] sodium chloride, [MAR Hold] acetaminophen **OR** [MAR Hold] acetaminophen, [MAR Hold] methocarbamol, [MAR Hold] ondansetron **OR** [MAR Hold] ondansetron (ZOFRAN) IV, [MAR Hold] polyethylene glycol, [MAR Hold] sodium chloride flush  Current Labs: reviewed  Results for Kristen, English (MRN [id number]) as of 10/04/2019 10:58  Ref. Range 10/03/2019 22:25  Saturation Ratios Latest Ref Range: 10.4 - 31.8 % 40 (H)  Ferritin Latest Ref Range: 11 - 307 ng/mL 618 (H)   Results for Kristen English (MRN [id number]) as of 10/04/2019 10:58  Ref. Range 10/02/2019 20:15  PTH, Intact Latest Ref Range: 15 - 65 pg/mL 337 (H)   Recent Labs  Lab 10/04/19 0449 10/05/19 0540 10/06/19 0318  NA 142 139  139 139  K 3.5 3.2*  3.1* 3.8  CL 111 104  104 107  CO2 22 26   26 23   GLUCOSE 167* 109*  109* 133*  BUN 40* 18  19 25*  CREATININE 7.04* 3.98*  3.93* 5.68*  CALCIUM 9.0 8.3*  8.2* 9.3  PHOS 6.6* 3.6 6.5*   Recent Labs  Lab 10/03/19 0153 10/04/19 0449 10/05/19 0540  WBC 6.8 6.4 6.3  NEUTROABS  --  4.7 4.2  HGB 7.2* 7.2* 7.3*  HCT 26.2* 25.0* 25.4*  MCV 97.0 93.6 93.4  PLT 131* 119* 122*     Physical Exam:  Blood pressure (!) 141/62, pulse 86, temperature 97.9 F (36.6 C), resp. rate 13, height 5' 8.5" (1.74 m), weight 131.6 kg, SpO2 100 %. Gen: letargic, but nad CV: RRR with 2/6 MSM, no rub Resp: CTAB, normal WOB, unlabored, bilateral chest expansion Ext: trace pitting edema bilateral LE's Neuro: lethargic but arousable (post-sedation) Abd: soft, nt/nd HEENT: EOMI, anicteric sclera  A 1. New ESRD with uremia; plan was for PD but put on hold 2/2 #2; started HD 7/16 with [facility]; s/p rue brachiocephalic avf 04/29/4707 with vvs 2. 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 DM2 4. Anemia, Hb 7s: fe replete 5. Gout  6. HTN 7. NAGMA 2/2 #1 8. CKD-BMD, hyperphosphatemia, no binders currently, trending  P . Can plan for dialysis this coming Thursday at [place] outpatient  unit [place] running TTS there[place]. Volume status currently acceptable and labs stable. Stable for discharge from nephrology perspective . With problem #2, hold ESA For now, Fe replete, transfuse prn . Renal diet . Medication Issues; o Preferred narcotic agents for pain control are hydromorphone, fentanyl, and methadone. Morphine should not be used.  o Baclofen should be avoided o Avoid oral sodium phosphate and magnesium citrate based laxatives / bowel preps   Gean Quint, MD [place]

## 2019-10-07 NOTE — Progress Notes (Signed)
Nutrition Brief Note  RD working remotely.  RD consulted for assessment of nutrition requirements/ status.   Wt Readings from Last 15 Encounters:  10/06/19 131.6 kg  09/29/19 (!) 140 kg  09/05/18 125.4 kg  02/28/18 125 kg  08/29/17 135.1 kg  02/21/17 132 kg  11/22/16 132.4 kg  11/15/16 130.6 kg  11/08/16 131.6 kg  08/08/16 134.8 kg  05/04/16 134.1 kg  02/03/16 135.2 kg  11/04/15 133.2 kg  08/05/15 128.9 kg  05/03/15 58.84 kg   64 year old female with history of essential hypertension, diabetes mellitus type 2, chronic kidney disease stage V who was supposed to be started on peritoneal dialysis but was deferred because of incidental renal mass found on imaging, gout, recent discharge from hospital on 09/29/2019 for hypoglycemia was seen by PCP on 10/02/2019 for orthopnea and anasarca and was sent to the hospital for admission for possible initiation of hemodialysis.  In the ED, bicarb was 17 and creatinine was 9.1 which was almost the same from her recent creatinine on discharge.  Nephrology was consulted.  Pt admitted with chronic kidney disease stage 5.   RD attempted to speak with pt via room phone x 2, however, no answer. Noted pt is down in OR for fistula placement.  Per MD notes, plan to d/c pt home after OR.   Per chart review, pt is new to HD. RD will provide handouts in AVS/ discharge instructions ("[place] for People with Kidney Disease"). Pt will also have access to RD at HD center for further support.   Albumin has a half-life of 21 days and is strongly affected by stress response and inflammatory process, therefore, do not expect to see an improvement in this lab value during acute hospitalization. When a patient presents with low albumin, it is likely skewed due to the acute inflammatory response.  Unless it is suspected that patient had poor PO intake or malnutrition prior to admission, then RD should not be consulted solely for low albumin. Note that low  albumin is no longer used to diagnose malnutrition; [place] uses the new malnutrition guidelines published by the American Society for Parenteral and Enteral Nutrition (A.S.P.E.N.) and the Academy of Nutrition and Dietetics (AND).    Body mass index is 43.47 kg/m. 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   Current diet order is NPO (previously renal/ carb modified), patient is consuming approximately 75-100% of meals at this time. Labs and medications reviewed.   No nutrition interventions warranted at this time. If nutrition issues arise, please consult RD.   Loistine Chance, RD, LDN, [place] Registered Dietitian II Certified Diabetes Care and Education Specialist Please refer to [facility] for RD and/or RD on-call/weekend/after hours pager

## 2019-10-07 NOTE — Anesthesia Preprocedure Evaluation (Addendum)
Anesthesia Evaluation  Patient identified by MRN, date of birth, ID band Patient awake    Reviewed: Allergy & Precautions, NPO status , Patient's Chart, lab work & pertinent test results  Airway Mallampati: III  TM Distance: >3 FB Neck ROM: Full    Dental  (+) Dental Advisory Given   Pulmonary neg pulmonary ROS,    breath sounds clear to auscultation       Cardiovascular hypertension, Pt. on medications and Pt. on home beta blockers  Rhythm:Regular Rate:Normal     Neuro/Psych negative neurological ROS     GI/Hepatic negative GI ROS, Neg liver ROS,   Endo/Other  diabetes, Type 2  Renal/GU ESRF and DialysisRenal disease     Musculoskeletal  (+) Arthritis ,   Abdominal   Peds  Hematology  (+) anemia ,   Anesthesia Other Findings   Reproductive/Obstetrics                             Lab Results  Component Value Date   WBC 6.5 10/07/2019   HGB 6.8 (LL) 10/07/2019   HCT 24.7 (L) 10/07/2019   MCV 96.9 10/07/2019   PLT 106 (L) 10/07/2019   Lab Results  Component Value Date   CREATININE 4.46 (H) 10/07/2019   BUN 14 10/07/2019   NA 140 10/07/2019   K 3.5 10/07/2019   CL 106 10/07/2019   CO2 29 10/07/2019    Anesthesia Physical Anesthesia Plan  ASA: III  Anesthesia Plan: General   Post-op Pain Management:    Induction: Intravenous  PONV Risk Score and Plan: 3 and Dexamethasone, Ondansetron and Treatment may vary due to age or medical condition  Airway Management Planned: LMA  Additional Equipment:   Intra-op Plan:   Post-operative Plan: Extubation in OR  Informed Consent: I have reviewed the patients History and Physical, chart, labs and discussed the procedure including the risks, benefits and alternatives for the proposed anesthesia with the patient or authorized representative who has indicated his/her understanding and acceptance.     Dental advisory given  Plan  Discussed with: CRNA  Anesthesia Plan Comments:        Anesthesia Quick Evaluation

## 2019-10-07 NOTE — Discharge Summary (Signed)
Physician Discharge Summary  Kristen English JXB:[id number] DOB: 1956/01/02 DOA: 10/02/2019  PCP: Wenda Low, MD  Admit date: 10/02/2019 Discharge date: 10/07/2019  Admitted From: Home Disposition: Home  Recommendations for Outpatient Follow-up:  1. Follow up with PCP in 1 week with repeat CBC/BMP 2. Follow-up with outpatient dialysis as scheduled 3. Follow-up with vascular surgery as an outpatient 4. Follow up in ED if symptoms worsen or new appear   Home Health: No Equipment/Devices: None  Discharge Condition: Stable CODE STATUS: Full Diet recommendation: Heart healthy  Brief/Interim Summary: 64 year old female with history of essential hypertension, diabetes mellitus type 2, chronic kidney disease stage V who was supposed to be started on peritoneal dialysis but was deferred because of incidental renal mass found on imaging, gout, recent discharge from hospital on 09/29/2019 for hypoglycemia was seen by PCP on 10/02/2019 for orthopnea and anasarca and was sent to the hospital for admission for possible initiation of hemodialysis.  In the ED, bicarb was 17 and creatinine was 9.1 which was almost the same from her recent creatinine on discharge.  Nephrology was consulted.  Patient was subsequently started on dialysis during the hospitalization.  Vascular surgery was also consulted.  Patient is planned for AV fistula placement today.  Outpatient hemodialysis unit/schedule has been arranged.  Nephrology has cleared the patient for discharge.  She will be discharged home after fistula placement today if cleared by vascular surgery.  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  Status post right IJ tunneled HD catheter placement by IR on 10/03/2019.  Subsequently patient has been started on dialysis.  Dialysis as per nephrology  schedule -Oral Lasix and sodium bicarbonate discontinued on 10/05/2019 after getting approval from nephrology. -Vascular surgery following: For probable right upper arm AV fistula placement today - Outpatient hemodialysis unit/schedule has been arranged.  Nephrology has cleared the patient for discharge.  She will be discharged home after fistula placement today if cleared by vascular surgery.  -Diabetes mellitus type 2 with recent episodes of hypoglycemia -Last A1c 5.4.    Carb modified diet.  Currently not on any form of insulin. Patient was recently hospitalized for episodes of hypoglycemia.  Outpatient follow-up  Anemia of chronic disease -From renal failure.    Hemoglobin 6.8 this morning.  1 unit packed red cell transfusion has been ordered.  Outpatient follow-up of CBC.  Hypoalbuminemia -Follow nutrition recommendations.  Essential hypertension -Blood pressure improving.  Outpatient follow-up. Continue amlodipine, Coreg, doxazosin.  Hypothyroidism-continue levothyroxine  Morbid obesity -Outpatient follow-up  Discharge Instructions  Discharge Instructions    Diet - low sodium heart healthy   Complete by: As directed    Increase activity slowly   Complete by: As directed      Allergies as of 10/07/2019      Reactions   Morphine And Related Itching      Medication List    STOP taking these medications   furosemide 80 MG tablet Commonly known as: LASIX   Victoza 18 MG/3ML Sopn Generic drug: liraglutide     TAKE these medications   amLODipine 10 MG tablet Commonly known as: NORVASC Take 10 mg by mouth at bedtime.   aspirin 81 MG tablet Take 81 mg by mouth daily.   atorvastatin 40 MG tablet Commonly known as: LIPITOR Take 40 mg by mouth daily.   carvedilol 25 MG tablet Commonly known as: COREG Take 25 mg by  mouth 2 (two) times daily with a meal.   colchicine 0.6 MG tablet Take 1 tablet (0.6 mg total) by mouth as needed (for gout flare. Don't repeat  dose for another 2 weeks.). What changed:   when to take this  reasons to take this   doxazosin 2 MG tablet Commonly known as: CARDURA Take 2 mg by mouth at bedtime.   FerrouSul 325 (65 FE) MG tablet Generic drug: ferrous sulfate Take 325 mg by mouth daily with breakfast.   levothyroxine 25 MCG tablet Commonly known as: SYNTHROID Take 25 mcg by mouth daily.       Follow-up Information    Wenda Low, MD. Schedule an appointment as soon as possible for a visit in 1 week(s).   Specialty: Internal Medicine Contact information: [address] [place] [address] [phone]        Outpatient dialysis Follow up.   Why: As scheduled       Angelia Mould, MD. Schedule an appointment as soon as possible for a visit in 1 week(s).   Specialties: Vascular Surgery, Cardiology Contact information: [address] [place] [address] [phone]              Allergies  Allergen Reactions  . Morphine And Related Itching    Consultations:  Nephrology/IR/vascular surgery   Procedures/Studies: IR Fluoro Guide CV Line Right  Result Date: 10/03/2019 INDICATION: 64 year old female with renal failure referred for hemodialysis catheter placement EXAM: IMAGE GUIDED TUNNELED HEMODIALYSIS CATHETER PLACEMENT MEDICATIONS: 2 g Ancef; The antibiotic was administered within an appropriate time interval prior to skin puncture. ANESTHESIA/SEDATION: Moderate (conscious) sedation was employed during this procedure. A total of Versed 0 mg and Fentanyl 25 mcg was administered intravenously. Moderate Sedation Time: 0 minutes. The patient's level of consciousness and vital signs were monitored continuously by radiology nursing throughout the procedure under my direct supervision. FLUOROSCOPY TIME:  Fluoroscopy Time: 0 minutes 36 seconds (5 mGy). COMPLICATIONS: None PROCEDURE: Informed written consent was obtained from the patient after a discussion of the risks,  benefits, and alternatives to treatment. Questions regarding the procedure were encouraged and answered. The right neck and chest were prepped with chlorhexidine in a sterile fashion, and a sterile drape was applied covering the operative field. Maximum barrier sterile technique with sterile gowns and gloves were used for the procedure. A timeout was performed prior to the initiation of the procedure. Ultrasound survey was performed. Micropuncture kit was utilized to access the right internal jugular vein under direct, real-time ultrasound guidance after the overlying soft tissues were anesthetized with 1% lidocaine with epinephrine. Stab incision was made with 11 blade scalpel. Microwire was passed centrally. The microwire was then marked to measure appropriate internal catheter length. External tunneled length was estimated. A total tip to cuff length of 19 cm was selected. 035 guidewire was advanced to the level of the IVC. Skin and subcutaneous tissues of chest wall below the clavicle were generously infiltrated with 1% lidocaine for local anesthesia. A small stab incision was made with 11 blade scalpel. The selected hemodialysis catheter was tunneled in a retrograde fashion from the anterior chest wall to the venotomy incision. Serial dilation was performed and then a peel-away sheath was placed. The catheter was then placed through the peel-away sheath with tips ultimately positioned within the superior aspect of the right atrium. Final catheter positioning was confirmed and documented with a spot radiographic image. The catheter aspirates and flushes normally. The catheter was flushed with appropriate  volume heparin dwells. The catheter exit site was secured with a 0-Prolene retention suture. Gel-Foam slurry was infused into the soft tissue tract. The venotomy incision was closed Derma bond and sterile dressing. Dressings were applied at the chest wall. Patient tolerated the procedure well and remained  hemodynamically stable throughout. No complications were encountered and no significant blood loss encountered. IMPRESSION: Status post placement of right IJ tunneled hemodialysis catheter. Signed, Dulcy Fanny. Dellia Nims, RPVI [facility] Radiology Electronically Signed   By: Corrie Mckusick D.O.   On: 10/03/2019 15:45   IR US Guide Vasc Access Right  Result Date: 10/03/2019 INDICATION: 64 year old female with renal failure referred for hemodialysis catheter placement EXAM: IMAGE GUIDED TUNNELED HEMODIALYSIS CATHETER PLACEMENT MEDICATIONS: 2 g Ancef; The antibiotic was administered within an appropriate time interval prior to skin puncture. ANESTHESIA/SEDATION: Moderate (conscious) sedation was employed during this procedure. A total of Versed 0 mg and Fentanyl 25 mcg was administered intravenously. Moderate Sedation Time: 0 minutes. The patient's level of consciousness and vital signs were monitored continuously by radiology nursing throughout the procedure under my direct supervision. FLUOROSCOPY TIME:  Fluoroscopy Time: 0 minutes 36 seconds (5 mGy). COMPLICATIONS: None PROCEDURE: Informed written consent was obtained from the patient after a discussion of the risks, benefits, and alternatives to treatment. Questions regarding the procedure were encouraged and answered. The right neck and chest were prepped with chlorhexidine in a sterile fashion, and a sterile drape was applied covering the operative field. Maximum barrier sterile technique with sterile gowns and gloves were used for the procedure. A timeout was performed prior to the initiation of the procedure. Ultrasound survey was performed. Micropuncture kit was utilized to access the right internal jugular vein under direct, real-time ultrasound guidance after the overlying soft tissues were anesthetized with 1% lidocaine with epinephrine. Stab incision was made with 11 blade scalpel. Microwire was passed  centrally. The microwire was then marked to measure appropriate internal catheter length. External tunneled length was estimated. A total tip to cuff length of 19 cm was selected. 035 guidewire was advanced to the level of the IVC. Skin and subcutaneous tissues of chest wall below the clavicle were generously infiltrated with 1% lidocaine for local anesthesia. A small stab incision was made with 11 blade scalpel. The selected hemodialysis catheter was tunneled in a retrograde fashion from the anterior chest wall to the venotomy incision. Serial dilation was performed and then a peel-away sheath was placed. The catheter was then placed through the peel-away sheath with tips ultimately positioned within the superior aspect of the right atrium. Final catheter positioning was confirmed and documented with a spot radiographic image. The catheter aspirates and flushes normally. The catheter was flushed with appropriate volume heparin dwells. The catheter exit site was secured with a 0-Prolene retention suture. Gel-Foam slurry was infused into the soft tissue tract. The venotomy incision was closed Derma bond and sterile dressing. Dressings were applied at the chest wall. Patient tolerated the procedure well and remained hemodynamically stable throughout. No complications were encountered and no significant blood loss encountered. IMPRESSION: Status post placement of right IJ tunneled hemodialysis catheter. Signed, Dulcy Fanny. Dellia Nims, RPVI [facility] Radiology Electronically Signed   By: Corrie Mckusick D.O.   On: 10/03/2019 15:45   VAS [place] UPPER EXT VEIN MAPPING (PRE-OP AVF)  Result Date: 10/04/2019 UPPER EXTREMITY VEIN MAPPING  Indications: Pre-access. Comparison Study: No prior studies. Performing Technologist: Darlin Coco Supporting Technologist: Sharion Dove RVS  Examination Guidelines: A complete evaluation includes B-mode imaging, spectral Doppler, color  Doppler, and power Doppler as needed of all accessible portions of each vessel. Bilateral testing is considered an integral part of a complete examination. Limited examinations for reoccurring indications may be performed as noted. +-----------------+-------------+----------+--------------+ Right Cephalic   Diameter (cm)Depth (cm)   Findings    +-----------------+-------------+----------+--------------+ Shoulder             0.47        2.27                  +-----------------+-------------+----------+--------------+ Prox upper arm       0.47        1.81                  +-----------------+-------------+----------+--------------+ Mid upper arm        0.37        2.00     branching    +-----------------+-------------+----------+--------------+ Dist upper arm       0.46        1.60                  +-----------------+-------------+----------+--------------+ Antecubital fossa    1.12        0.47                  +-----------------+-------------+----------+--------------+ Prox forearm         0.45        1.22     branching    +-----------------+-------------+----------+--------------+ Mid forearm          0.26        0.85     branching    +-----------------+-------------+----------+--------------+ Dist forearm         0.22        0.77                  +-----------------+-------------+----------+--------------+ Wrist                                   not visualized +-----------------+-------------+----------+--------------+ +-----------------+-------------+----------+--------------+ Right Basilic    Diameter (cm)Depth (cm)   Findings    +-----------------+-------------+----------+--------------+ Prox upper arm       0.36                              +-----------------+-------------+----------+--------------+ Mid upper arm        0.32                              +-----------------+-------------+----------+--------------+ Dist upper arm       0.30                  branching    +-----------------+-------------+----------+--------------+ Antecubital fossa    0.30                 branching    +-----------------+-------------+----------+--------------+ Prox forearm         0.15                              +-----------------+-------------+----------+--------------+ Mid forearm          0.11                              +-----------------+-------------+----------+--------------+  Distal forearm       0.11                              +-----------------+-------------+----------+--------------+ Wrist                                   not visualized +-----------------+-------------+----------+--------------+ +-----------------+-------------+----------+------------------------------+ Left Cephalic    Diameter (cm)Depth (cm)           Findings            +-----------------+-------------+----------+------------------------------+ Shoulder                                        not visualized         +-----------------+-------------+----------+------------------------------+ Prox upper arm       0.16        0.17                                  +-----------------+-------------+----------+------------------------------+ Mid upper arm        0.27        1.60             branching            +-----------------+-------------+----------+------------------------------+ Dist upper arm       0.24        1.29             branching            +-----------------+-------------+----------+------------------------------+ Antecubital fossa    0.47        0.49                                  +-----------------+-------------+----------+------------------------------+ Prox forearm         0.26        0.63                                  +-----------------+-------------+----------+------------------------------+ Mid forearm          0.20        0.54   branching and partial thrombus  +-----------------+-------------+----------+------------------------------+ Dist forearm         0.18        0.59                                  +-----------------+-------------+----------+------------------------------+ Wrist                0.20        0.69                                  +-----------------+-------------+----------+------------------------------+ +-----------------+-------------+----------+--------------+ Left Basilic     Diameter (cm)Depth (cm)   Findings    +-----------------+-------------+----------+--------------+ Dist upper arm       0.28                   Origin     +-----------------+-------------+----------+--------------+ Antecubital fossa    0.33                              +-----------------+-------------+----------+--------------+  Prox forearm         0.24                 branching    +-----------------+-------------+----------+--------------+ Mid forearm          0.14                 branching    +-----------------+-------------+----------+--------------+ Distal forearm                          not visualized +-----------------+-------------+----------+--------------+ Wrist                                   not visualized +-----------------+-------------+----------+--------------+ *See table(s) above for measurements and observations.  Diagnosing physician: Ruta Hinds MD Electronically signed by Ruta Hinds MD on 10/04/2019 at 12:29:55 PM.    Final        Subjective: Patient seen and examined at bedside.  She denies any worsening shortness of breath, nausea, vomiting or abdominal pain.  Feels that she would be okay going home after her fistula surgery today.  Discharge Exam: Vitals:   10/07/19 0915 10/07/19 0940  BP: (!) 146/69 (!) 146/63  Pulse: 87 76  Resp: 19 18  Temp: 98.8 F (37.1 C) 99 F (37.2 C)  SpO2: 94% 99%    General: Pt is alert, awake, not in acute distress Cardiovascular: rate controlled, S1/S2  + Respiratory: bilateral decreased breath sounds at bases with some scattered crackles Abdominal: Soft, morbidly obese, NT, ND, bowel sounds + Extremities: Bilateral lower extremity edema present; no cyanosis    The results of significant diagnostics from this hospitalization (including imaging, microbiology, ancillary and laboratory) are listed below for reference.     Microbiology: Recent Results (from the past 240 hour(s))  SARS Coronavirus 2 by RT PCR (hospital order, performed in [facility] hospital lab) Nasopharyngeal Nasopharyngeal Swab     Status: None   Collection Time: 09/27/19  9:45 PM   Specimen: Nasopharyngeal Swab  Result Value Ref Range Status   SARS Coronavirus 2 NEGATIVE NEGATIVE Final    Comment: (NOTE) SARS-CoV-2 target nucleic acids are NOT DETECTED.  The SARS-CoV-2 RNA is generally detectable in upper and lower respiratory specimens during the acute phase of infection. The lowest concentration of SARS-CoV-2 viral copies this assay can detect is 250 copies / mL. A negative result does not preclude SARS-CoV-2 infection and should not be used as the sole basis for treatment or other patient management decisions.  A negative result may occur with improper specimen collection / handling, submission of specimen other than nasopharyngeal swab, presence of viral mutation(s) within the areas targeted by this assay, and inadequate number of viral copies (<250 copies / mL). A negative result must be combined with clinical observations, patient history, and epidemiological information.  Fact Sheet for Patients:   StrictlyIdeas.no  Fact Sheet for Healthcare Providers: BankingDealers.co.za  This test is not yet approved or  cleared by the [place] FDA and has been authorized for detection and/or diagnosis of SARS-CoV-2 by FDA under an Emergency Use Authorization (EUA).  This EUA will remain in effect (meaning this  test can be used) for the duration of the COVID-19 declaration under Section 564(b)(1) of the Act, 21 U.S.C. section 360bbb-3(b)(1), unless the authorization is terminated or revoked sooner.  Performed at [facility], [place] [address], [place], [place] [address]   SARS  Coronavirus 2 by RT PCR (hospital order, performed in [facility] hospital lab) Nasopharyngeal Nasopharyngeal Swab     Status: None   Collection Time: 10/06/19  7:13 PM   Specimen: Nasopharyngeal Swab  Result Value Ref Range Status   SARS Coronavirus 2 NEGATIVE NEGATIVE Final    Comment: (NOTE) SARS-CoV-2 target nucleic acids are NOT DETECTED.  The SARS-CoV-2 RNA is generally detectable in upper and lower respiratory specimens during the acute phase of infection. The lowest concentration of SARS-CoV-2 viral copies this assay can detect is 250 copies / mL. A negative result does not preclude SARS-CoV-2 infection and should not be used as the sole basis for treatment or other patient management decisions.  A negative result may occur with improper specimen collection / handling, submission of specimen other than nasopharyngeal swab, presence of viral mutation(s) within the areas targeted by this assay, and inadequate number of viral copies (<250 copies / mL). A negative result must be combined with clinical observations, patient history, and epidemiological information.  Fact Sheet for Patients:   StrictlyIdeas.no  Fact Sheet for Healthcare Providers: BankingDealers.co.za  This test is not yet approved or  cleared by the [place] FDA and has been authorized for detection and/or diagnosis of SARS-CoV-2 by FDA under an Emergency Use Authorization (EUA).  This EUA will remain in effect (meaning this test can be used) for the duration of the COVID-19 declaration under Section 564(b)(1) of the Act, 21 U.S.C. section 360bbb-3(b)(1), unless the authorization is  terminated or revoked sooner.  Performed at [facility], [place] [address] [place], [place] [address]   Surgical pcr screen     Status: None   Collection Time: 10/07/19  4:15 AM   Specimen: Nasal Mucosa; Nasal Swab  Result Value Ref Range Status   MRSA, PCR NEGATIVE NEGATIVE Final   Staphylococcus aureus NEGATIVE NEGATIVE Final    Comment: (NOTE) The Xpert SA Assay (FDA approved for NASAL specimens in patients 38 years of age and older), is one component of a comprehensive surveillance program. It is not intended to diagnose infection nor to guide or monitor treatment. Performed at [facility], [place] [address] [place], [place] [address]      Labs: BNP (last 3 results) No results for input(s): BNP in the last 8760 hours. Basic Metabolic Panel: Recent Labs  Lab 10/03/19 0153 10/03/19 2225 10/04/19 0449 10/05/19 0540 10/06/19 0318 10/07/19 0223  NA 141  --  142 139  139 139 140  K 4.8  --  3.5 3.2*  3.1* 3.8 3.5  CL 117*  --  111 104  104 107 106  CO2 15*  --  '22 26  26 23 29  ' GLUCOSE 102*  --  167* 109*  109* 133* 131*  BUN 60*  --  40* 18  19 25* 14  CREATININE 9.40*  --  7.04* 3.98*  3.93* 5.68* 4.46*  CALCIUM 9.4 9.4 9.0 8.3*  8.2* 9.3 9.3  PHOS 9.8*  --  6.6* 3.6 6.5* 5.4*   Liver Function Tests: Recent Labs  Lab 10/03/19 0153 10/04/19 0449 10/05/19 0540 10/06/19 0318 10/07/19 0223  AST  --   --  16  --   --   ALT  --   --  12  --   --   ALKPHOS  --   --  55  --   --   BILITOT  --   --  0.4  --   --  PROT  --   --  5.1*  --   --   ALBUMIN 1.8* 1.9* 2.0*  1.9* 1.9* 1.8*   No results for input(s): LIPASE, AMYLASE in the last 168 hours. No results for input(s): AMMONIA in the last 168 hours. CBC: Recent Labs  Lab 10/02/19 1233 10/03/19 0153 10/04/19 0449 10/05/19 0540 10/07/19 0223  WBC 7.5 6.8 6.4 6.3 6.5  NEUTROABS  --   --  4.7 4.2  --   HGB 8.5* 7.2* 7.2* 7.3* 6.8*  HCT 30.3* 26.2* 25.0* 25.4* 24.7*  MCV 95.6 97.0 93.6  93.4 96.9  PLT 152 131* 119* 122* 106*   Cardiac Enzymes: No results for input(s): CKTOTAL, CKMB, CKMBINDEX, TROPONINI in the last 168 hours. BNP: Invalid input(s): POCBNP CBG: Recent Labs  Lab 10/06/19 0646 10/06/19 1210 10/06/19 1742 10/06/19 2057 10/07/19 0740  GLUCAP 126* 107* 132* 145* 116*   D-Dimer No results for input(s): DDIMER in the last 72 hours. Hgb A1c No results for input(s): HGBA1C in the last 72 hours. Lipid Profile No results for input(s): CHOL, HDL, LDLCALC, TRIG, CHOLHDL, LDLDIRECT in the last 72 hours. Thyroid function studies No results for input(s): TSH, T4TOTAL, T3FREE, THYROIDAB in the last 72 hours.  Invalid input(s): FREET3 Anemia work up No results for input(s): VITAMINB12, FOLATE, FERRITIN, TIBC, IRON, RETICCTPCT in the last 72 hours. Urinalysis    Component Value Date/Time   COLORURINE YELLOW 10/02/2019 1940   APPEARANCEUR HAZY (A) 10/02/2019 1940   LABSPEC 1.020 10/02/2019 1940   PHURINE 5.0 10/02/2019 1940   GLUCOSEU >=500 (A) 10/02/2019 1940   HGBUR SMALL (A) 10/02/2019 1940   BILIRUBINUR NEGATIVE 10/02/2019 1940   KETONESUR NEGATIVE 10/02/2019 1940   PROTEINUR >=300 (A) 10/02/2019 1942   UROBILINOGEN 0.2 02/20/2008 1241   NITRITE NEGATIVE 10/02/2019 1940   LEUKOCYTESUR NEGATIVE 10/02/2019 1940   Sepsis Labs Invalid input(s): PROCALCITONIN,  WBC,  LACTICIDVEN Microbiology Recent Results (from the past 240 hour(s))  SARS Coronavirus 2 by RT PCR (hospital order, performed in [place] hospital lab) Nasopharyngeal Nasopharyngeal Swab     Status: None   Collection Time: 09/27/19  9:45 PM   Specimen: Nasopharyngeal Swab  Result Value Ref Range Status   SARS Coronavirus 2 NEGATIVE NEGATIVE Final    Comment: (NOTE) SARS-CoV-2 target nucleic acids are NOT DETECTED.  The SARS-CoV-2 RNA is generally detectable in upper and lower respiratory specimens during the acute phase of infection. The lowest concentration of SARS-CoV-2 viral  copies this assay can detect is 250 copies / mL. A negative result does not preclude SARS-CoV-2 infection and should not be used as the sole basis for treatment or other patient management decisions.  A negative result may occur with improper specimen collection / handling, submission of specimen other than nasopharyngeal swab, presence of viral mutation(s) within the areas targeted by this assay, and inadequate number of viral copies (<250 copies / mL). A negative result must be combined with clinical observations, patient history, and epidemiological information.  Fact Sheet for Patients:   StrictlyIdeas.no  Fact Sheet for Healthcare Providers: BankingDealers.co.za  This test is not yet approved or  cleared by the [place] FDA and has been authorized for detection and/or diagnosis of SARS-CoV-2 by FDA under an Emergency Use Authorization (EUA).  This EUA will remain in effect (meaning this test can be used) for the duration of the COVID-19 declaration under Section 564(b)(1) of the Act, 21 U.S.C. section 360bbb-3(b)(1), unless the authorization is terminated or revoked sooner.  Performed  at [facility], [place] [address], [place], [place] [address]   SARS Coronavirus 2 by RT PCR (hospital order, performed in [facility] hospital lab) Nasopharyngeal Nasopharyngeal Swab     Status: None   Collection Time: 10/06/19  7:13 PM   Specimen: Nasopharyngeal Swab  Result Value Ref Range Status   SARS Coronavirus 2 NEGATIVE NEGATIVE Final    Comment: (NOTE) SARS-CoV-2 target nucleic acids are NOT DETECTED.  The SARS-CoV-2 RNA is generally detectable in upper and lower respiratory specimens during the acute phase of infection. The lowest concentration of SARS-CoV-2 viral copies this assay can detect is 250 copies / mL. A negative result does not preclude SARS-CoV-2 infection and should not be used as the sole basis for treatment or  other patient management decisions.  A negative result may occur with improper specimen collection / handling, submission of specimen other than nasopharyngeal swab, presence of viral mutation(s) within the areas targeted by this assay, and inadequate number of viral copies (<250 copies / mL). A negative result must be combined with clinical observations, patient history, and epidemiological information.  Fact Sheet for Patients:   StrictlyIdeas.no  Fact Sheet for Healthcare Providers: BankingDealers.co.za  This test is not yet approved or  cleared by the [place] FDA and has been authorized for detection and/or diagnosis of SARS-CoV-2 by FDA under an Emergency Use Authorization (EUA).  This EUA will remain in effect (meaning this test can be used) for the duration of the COVID-19 declaration under Section 564(b)(1) of the Act, 21 U.S.C. section 360bbb-3(b)(1), unless the authorization is terminated or revoked sooner.  Performed at [facility], [place] [address] [place], [place] [address]   Surgical pcr screen     Status: None   Collection Time: 10/07/19  4:15 AM   Specimen: Nasal Mucosa; Nasal Swab  Result Value Ref Range Status   MRSA, PCR NEGATIVE NEGATIVE Final   Staphylococcus aureus NEGATIVE NEGATIVE Final    Comment: (NOTE) The Xpert SA Assay (FDA approved for NASAL specimens in patients 43 years of age and older), is one component of a comprehensive surveillance program. It is not intended to diagnose infection nor to guide or monitor treatment. Performed at [facility], [place] [address], [place], [place] [address]      Time coordinating discharge: 35 minutes  SIGNED:   Aline August, MD  [facility] 10/07/2019, 10:37 AM

## 2019-10-07 NOTE — Anesthesia Procedure Notes (Addendum)
Procedure Name: Intubation Date/Time: 10/07/2019 12:08 PM Performed by: Barrington Ellison, CRNA Pre-anesthesia Checklist: Suction available and Patient being monitored Patient Re-evaluated:Patient Re-evaluated prior to induction Oxygen Delivery Method: Circle system utilized Preoxygenation: Pre-oxygenation with 100% oxygen Induction Type: IV induction Ventilation: Two handed mask ventilation required Laryngoscope Size: Mac and 3 Grade View: Grade I Tube type: Oral Tube size: 7.0 mm Number of attempts: 1 Airway Equipment and Method: Stylet Placement Confirmation: ETT inserted through vocal cords under direct vision,  positive ETCO2 and breath sounds checked- equal and bilateral Secured at: 22 cm Tube secured with: Tape Dental Injury: Teeth and Oropharynx as per pre-operative assessment

## 2019-10-07 NOTE — Progress Notes (Signed)
2035 Going home instructions was already given by AM Rn. Dialysis Cath dsg changed by IV Nurse. D/c pt home , NT to bring pt  Downstairs.

## 2019-10-08 ENCOUNTER — Inpatient Hospital Stay (HOSPITAL_COMMUNITY)
Admission: EM | Admit: 2019-10-08 | Discharge: 2019-10-10 | DRG: 189 | Disposition: A | Discharge status: Home / Self Care | Payer: Medicare HMO | Attending: Internal Medicine | Admitting: Internal Medicine

## 2019-10-08 ENCOUNTER — Inpatient Hospital Stay (HOSPITAL_COMMUNITY): Payer: Medicare HMO

## 2019-10-08 ENCOUNTER — Emergency Department (HOSPITAL_COMMUNITY): Payer: Medicare HMO

## 2019-10-08 ENCOUNTER — Encounter (HOSPITAL_COMMUNITY): Payer: Self-pay | Admitting: Emergency Medicine

## 2019-10-08 DIAGNOSIS — Z20822 Contact with and (suspected) exposure to covid-19: Secondary | ICD-10-CM | POA: Diagnosis not present

## 2019-10-08 DIAGNOSIS — D649 Anemia, unspecified: Secondary | ICD-10-CM | POA: Diagnosis not present

## 2019-10-08 DIAGNOSIS — R0902 Hypoxemia: Secondary | ICD-10-CM | POA: Diagnosis not present

## 2019-10-08 DIAGNOSIS — N2889 Other specified disorders of kidney and ureter: Secondary | ICD-10-CM | POA: Diagnosis present

## 2019-10-08 DIAGNOSIS — I5032 Chronic diastolic (congestive) heart failure: Secondary | ICD-10-CM | POA: Diagnosis not present

## 2019-10-08 DIAGNOSIS — Z992 Dependence on renal dialysis: Secondary | ICD-10-CM

## 2019-10-08 DIAGNOSIS — Z7982 Long term (current) use of aspirin: Secondary | ICD-10-CM | POA: Diagnosis not present

## 2019-10-08 DIAGNOSIS — R55 Syncope and collapse: Secondary | ICD-10-CM | POA: Diagnosis not present

## 2019-10-08 DIAGNOSIS — E1122 Type 2 diabetes mellitus with diabetic chronic kidney disease: Secondary | ICD-10-CM | POA: Diagnosis present

## 2019-10-08 DIAGNOSIS — E8770 Fluid overload, unspecified: Secondary | ICD-10-CM | POA: Diagnosis not present

## 2019-10-08 DIAGNOSIS — E162 Hypoglycemia, unspecified: Secondary | ICD-10-CM | POA: Diagnosis present

## 2019-10-08 DIAGNOSIS — N186 End stage renal disease: Secondary | ICD-10-CM | POA: Diagnosis present

## 2019-10-08 DIAGNOSIS — R410 Disorientation, unspecified: Secondary | ICD-10-CM | POA: Diagnosis not present

## 2019-10-08 DIAGNOSIS — Z79890 Hormone replacement therapy (postmenopausal): Secondary | ICD-10-CM

## 2019-10-08 DIAGNOSIS — D638 Anemia in other chronic diseases classified elsewhere: Secondary | ICD-10-CM | POA: Diagnosis present

## 2019-10-08 DIAGNOSIS — I132 Hypertensive heart and chronic kidney disease with heart failure and with stage 5 chronic kidney disease, or end stage renal disease: Secondary | ICD-10-CM | POA: Diagnosis present

## 2019-10-08 DIAGNOSIS — E1129 Type 2 diabetes mellitus with other diabetic kidney complication: Secondary | ICD-10-CM | POA: Diagnosis not present

## 2019-10-08 DIAGNOSIS — J9601 Acute respiratory failure with hypoxia: Secondary | ICD-10-CM | POA: Diagnosis not present

## 2019-10-08 DIAGNOSIS — Z905 Acquired absence of kidney: Secondary | ICD-10-CM

## 2019-10-08 DIAGNOSIS — Z79899 Other long term (current) drug therapy: Secondary | ICD-10-CM | POA: Diagnosis not present

## 2019-10-08 DIAGNOSIS — J9602 Acute respiratory failure with hypercapnia: Secondary | ICD-10-CM | POA: Diagnosis not present

## 2019-10-08 DIAGNOSIS — I959 Hypotension, unspecified: Secondary | ICD-10-CM | POA: Diagnosis not present

## 2019-10-08 DIAGNOSIS — E039 Hypothyroidism, unspecified: Secondary | ICD-10-CM | POA: Diagnosis present

## 2019-10-08 DIAGNOSIS — R52 Pain, unspecified: Secondary | ICD-10-CM

## 2019-10-08 DIAGNOSIS — N179 Acute kidney failure, unspecified: Secondary | ICD-10-CM | POA: Diagnosis not present

## 2019-10-08 DIAGNOSIS — Z8249 Family history of ischemic heart disease and other diseases of the circulatory system: Secondary | ICD-10-CM

## 2019-10-08 DIAGNOSIS — R4020 Unspecified coma: Secondary | ICD-10-CM | POA: Diagnosis not present

## 2019-10-08 DIAGNOSIS — J811 Chronic pulmonary edema: Secondary | ICD-10-CM | POA: Diagnosis not present

## 2019-10-08 DIAGNOSIS — R404 Transient alteration of awareness: Secondary | ICD-10-CM | POA: Diagnosis not present

## 2019-10-08 DIAGNOSIS — R05 Cough: Secondary | ICD-10-CM | POA: Diagnosis not present

## 2019-10-08 DIAGNOSIS — E662 Morbid (severe) obesity with alveolar hypoventilation: Secondary | ICD-10-CM | POA: Diagnosis present

## 2019-10-08 DIAGNOSIS — I120 Hypertensive chronic kidney disease with stage 5 chronic kidney disease or end stage renal disease: Secondary | ICD-10-CM | POA: Diagnosis not present

## 2019-10-08 DIAGNOSIS — N185 Chronic kidney disease, stage 5: Secondary | ICD-10-CM | POA: Diagnosis not present

## 2019-10-08 DIAGNOSIS — Z96653 Presence of artificial knee joint, bilateral: Secondary | ICD-10-CM | POA: Diagnosis present

## 2019-10-08 DIAGNOSIS — M7989 Other specified soft tissue disorders: Secondary | ICD-10-CM

## 2019-10-08 DIAGNOSIS — I10 Essential (primary) hypertension: Secondary | ICD-10-CM | POA: Diagnosis not present

## 2019-10-08 DIAGNOSIS — N250 Renal osteodystrophy: Secondary | ICD-10-CM | POA: Diagnosis not present

## 2019-10-08 DIAGNOSIS — I5031 Acute diastolic (congestive) heart failure: Secondary | ICD-10-CM | POA: Diagnosis not present

## 2019-10-08 DIAGNOSIS — I517 Cardiomegaly: Secondary | ICD-10-CM | POA: Diagnosis not present

## 2019-10-08 LAB — CREATININE, SERUM
Creatinine, Ser: 5.91 mg/dL — ABNORMAL HIGH (ref 0.44–1.00)
GFR calc Af Amer: 8 mL/min — ABNORMAL LOW (ref >60)
GFR calc non Af Amer: 7 mL/min — ABNORMAL LOW (ref >60)

## 2019-10-08 LAB — COMPREHENSIVE METABOLIC PANEL
ALT: 10 U/L (ref 0–44)
AST: 26 U/L (ref 15–41)
Albumin: 1.9 g/dL — ABNORMAL LOW (ref 3.5–5.0)
Alkaline Phosphatase: 49 U/L (ref 38–126)
Anion gap: 8 (ref 5–15)
BUN: 21 mg/dL (ref 8–23)
CO2: 26 mmol/L (ref 22–32)
Calcium: 9.7 mg/dL (ref 8.9–10.3)
Chloride: 105 mmol/L (ref 98–111)
Creatinine, Ser: 5.82 mg/dL — ABNORMAL HIGH (ref 0.44–1.00)
GFR calc Af Amer: 8 mL/min — ABNORMAL LOW (ref >60)
GFR calc non Af Amer: 7 mL/min — ABNORMAL LOW (ref >60)
Glucose, Bld: 195 mg/dL — ABNORMAL HIGH (ref 70–99)
Potassium: 4.2 mmol/L (ref 3.5–5.1)
Sodium: 139 mmol/L (ref 135–145)
Total Bilirubin: 0.3 mg/dL (ref 0.3–1.2)
Total Protein: 4.9 g/dL — ABNORMAL LOW (ref 6.5–8.1)

## 2019-10-08 LAB — TYPE AND SCREEN
ABO/RH(D): B POS
Antibody Screen: NEGATIVE
Unit division: 0

## 2019-10-08 LAB — I-STAT ARTERIAL BLOOD GAS, ED
Acid-Base Excess: 1 mmol/L (ref 0.0–2.0)
Acid-Base Excess: 0 mmol/L (ref 0.0–2.0)
Bicarbonate: 29.9 mmol/L — ABNORMAL HIGH (ref 20.0–28.0)
Bicarbonate: 30.5 mmol/L — ABNORMAL HIGH (ref 20.0–28.0)
Calcium, Ion: 1.44 mmol/L — ABNORMAL HIGH (ref 1.15–1.40)
Calcium, Ion: 1.47 mmol/L — ABNORMAL HIGH (ref 1.15–1.40)
HCT: 25 % — ABNORMAL LOW (ref 36.0–46.0)
HCT: 29 % — ABNORMAL LOW (ref 36.0–46.0)
Hemoglobin: 8.5 g/dL — ABNORMAL LOW (ref 12.0–15.0)
Hemoglobin: 9.9 g/dL — ABNORMAL LOW (ref 12.0–15.0)
O2 Saturation: 66 %
O2 Saturation: 93 %
Patient temperature: 98.8
Patient temperature: 98.8
Potassium: 4.1 mmol/L (ref 3.5–5.1)
Potassium: 4 mmol/L (ref 3.5–5.1)
Sodium: 141 mmol/L (ref 135–145)
Sodium: 141 mmol/L (ref 135–145)
TCO2: 32 mmol/L (ref 22–32)
TCO2: 33 mmol/L — ABNORMAL HIGH (ref 22–32)
pCO2 arterial: 76.4 mmHg (ref 32.0–48.0)
pCO2 arterial: 91.2 mmHg (ref 32.0–48.0)
pH, Arterial: 7.2 — ABNORMAL LOW (ref 7.350–7.450)
pH, Arterial: 7.133 — CL (ref 7.350–7.450)
pO2, Arterial: 44 mmHg — ABNORMAL LOW (ref 83.0–108.0)
pO2, Arterial: 90 mmHg (ref 83.0–108.0)

## 2019-10-08 LAB — ECHOCARDIOGRAM COMPLETE
Area-P 1/2: 3.08 cm2
Calc EF: 68.2 %
Height: 68 in
S' Lateral: 3.3 cm
Single Plane A2C EF: 69.5 %
Single Plane A4C EF: 66.3 %
Weight: 4624 oz

## 2019-10-08 LAB — CBG MONITORING, ED
Glucose-Capillary: 138 mg/dL — ABNORMAL HIGH (ref 70–99)
Glucose-Capillary: 108 mg/dL — ABNORMAL HIGH (ref 70–99)

## 2019-10-08 LAB — SARS CORONAVIRUS 2 BY RT PCR (HOSPITAL ORDER, PERFORMED IN ~~LOC~~ HOSPITAL LAB): SARS Coronavirus 2: NEGATIVE

## 2019-10-08 LAB — CBC WITH DIFFERENTIAL/PLATELET
Abs Immature Granulocytes: 0.04 10*3/uL (ref 0.00–0.07)
Basophils Absolute: 0 10*3/uL (ref 0.0–0.1)
Basophils Relative: 0 %
Eosinophils Absolute: 0 10*3/uL (ref 0.0–0.5)
Eosinophils Relative: 0 %
HCT: 28.1 % — ABNORMAL LOW (ref 36.0–46.0)
Hemoglobin: 7.7 g/dL — ABNORMAL LOW (ref 12.0–15.0)
Immature Granulocytes: 0 %
Lymphocytes Relative: 5 %
Lymphs Abs: 0.5 10*3/uL — ABNORMAL LOW (ref 0.7–4.0)
MCH: 27.4 pg (ref 26.0–34.0)
MCHC: 27.4 g/dL — ABNORMAL LOW (ref 30.0–36.0)
MCV: 100 fL (ref 80.0–100.0)
Monocytes Absolute: 0.6 10*3/uL (ref 0.1–1.0)
Monocytes Relative: 6 %
Neutro Abs: 9 10*3/uL — ABNORMAL HIGH (ref 1.7–7.7)
Neutrophils Relative %: 89 %
Platelets: 116 10*3/uL — ABNORMAL LOW (ref 150–400)
RBC: 2.81 MIL/uL — ABNORMAL LOW (ref 3.87–5.11)
RDW: 15.3 % (ref 11.5–15.5)
WBC: 10.1 10*3/uL (ref 4.0–10.5)
nRBC: 0 % (ref 0.0–0.2)

## 2019-10-08 LAB — BRAIN NATRIURETIC PEPTIDE: B Natriuretic Peptide: 185.3 pg/mL — ABNORMAL HIGH (ref 0.0–100.0)

## 2019-10-08 LAB — TROPONIN I (HIGH SENSITIVITY)
Troponin I (High Sensitivity): 20 ng/L — ABNORMAL HIGH (ref <18)
Troponin I (High Sensitivity): 19 ng/L — ABNORMAL HIGH (ref <18)

## 2019-10-08 LAB — BPAM RBC
Blood Product Expiration Date: 202108202359
ISSUE DATE / TIME: 202107200915
Unit Type and Rh: 7300

## 2019-10-08 LAB — GLUCOSE, CAPILLARY: Glucose-Capillary: 193 mg/dL — ABNORMAL HIGH (ref 70–99)

## 2019-10-08 MED ORDER — HEPARIN SODIUM (PORCINE) 5000 UNIT/ML IJ SOLN
5000.0000 [IU] | Freq: Three times a day (TID) | INTRAMUSCULAR | Status: DC
  Administered 2019-10-08 – 2019-10-10 (×7): 5000 [IU] via SUBCUTANEOUS
  Filled 2019-10-08 (×7): qty 1

## 2019-10-08 MED ORDER — ACETAMINOPHEN 325 MG PO TABS
650.0000 mg | ORAL_TABLET | Freq: Four times a day (QID) | ORAL | Status: DC | PRN
  Administered 2019-10-08 – 2019-10-09 (×2): 650 mg via ORAL
  Filled 2019-10-08 (×2): qty 2

## 2019-10-08 MED ORDER — ATORVASTATIN CALCIUM 40 MG PO TABS
40.0000 mg | ORAL_TABLET | Freq: Every day | ORAL | Status: DC
  Administered 2019-10-08 – 2019-10-10 (×3): 40 mg via ORAL
  Filled 2019-10-08 (×3): qty 1

## 2019-10-08 MED ORDER — CHLORHEXIDINE GLUCONATE CLOTH 2 % EX PADS
6.0000 | MEDICATED_PAD | Freq: Every day | CUTANEOUS | Status: DC
  Administered 2019-10-08 – 2019-10-09 (×2): 6 via TOPICAL

## 2019-10-08 MED ORDER — LEVOTHYROXINE SODIUM 25 MCG PO TABS
25.0000 ug | ORAL_TABLET | Freq: Every day | ORAL | Status: DC
  Administered 2019-10-09 – 2019-10-10 (×2): 25 ug via ORAL
  Filled 2019-10-08 (×3): qty 1

## 2019-10-08 MED ORDER — ASPIRIN EC 81 MG PO TBEC
81.0000 mg | DELAYED_RELEASE_TABLET | Freq: Every day | ORAL | Status: DC
  Administered 2019-10-08 – 2019-10-10 (×3): 81 mg via ORAL
  Filled 2019-10-08 (×3): qty 1

## 2019-10-08 MED ORDER — PERFLUTREN LIPID MICROSPHERE
1.0000 mL | INTRAVENOUS | Status: AC | PRN
  Administered 2019-10-08: 2 mL via INTRAVENOUS
  Filled 2019-10-08: qty 10

## 2019-10-08 MED ORDER — ACETAMINOPHEN 650 MG RE SUPP: 650.0000 mg | Freq: Four times a day (QID) | RECTAL | Status: DC | PRN

## 2019-10-08 MED ORDER — FERROUS SULFATE 325 (65 FE) MG PO TABS
325.0000 mg | ORAL_TABLET | Freq: Every day | ORAL | Status: DC
  Administered 2019-10-08 – 2019-10-10 (×3): 325 mg via ORAL
  Filled 2019-10-08 (×3): qty 1

## 2019-10-08 MED ORDER — DOXAZOSIN MESYLATE 2 MG PO TABS
2.0000 mg | ORAL_TABLET | Freq: Every day | ORAL | Status: DC
  Administered 2019-10-08 – 2019-10-09 (×2): 2 mg via ORAL
  Filled 2019-10-08 (×3): qty 1

## 2019-10-08 MED ORDER — COLCHICINE 0.6 MG PO TABS: 0.6000 mg | ORAL_TABLET | ORAL | Status: DC | PRN

## 2019-10-08 MED ORDER — CARVEDILOL 25 MG PO TABS
25.0000 mg | ORAL_TABLET | Freq: Two times a day (BID) | ORAL | Status: DC
  Administered 2019-10-08 – 2019-10-10 (×3): 25 mg via ORAL
  Filled 2019-10-08 (×3): qty 1

## 2019-10-08 MED ORDER — AMLODIPINE BESYLATE 10 MG PO TABS
10.0000 mg | ORAL_TABLET | Freq: Every day | ORAL | Status: DC
  Administered 2019-10-08 – 2019-10-10 (×3): 10 mg via ORAL
  Filled 2019-10-08: qty 2
  Filled 2019-10-08 (×2): qty 1

## 2019-10-08 NOTE — ED Notes (Signed)
IV attempted x's 2 without success 

## 2019-10-08 NOTE — Progress Notes (Signed)
  Echocardiogram 2D Echocardiogram with definity has been performed.  Kristen English M 10/08/2019, 10:55 AM

## 2019-10-08 NOTE — Progress Notes (Signed)
Lower extremity venous bilateral study completed.   See Cv Proc for preliminary results.   Syretta Kochel  

## 2019-10-08 NOTE — Progress Notes (Signed)
Navigator aware of patient's re-admission and has notified OP HD clinic. Navigator will monitor closely for new start date at OP HD clinic. Patient is approved for transportation with [facility].  Alphonzo Cruise, [place] Renal Navigator [phone]

## 2019-10-08 NOTE — ED Notes (Signed)
IV team in to access pt for IV access

## 2019-10-08 NOTE — Progress Notes (Signed)
Will leave pt on bipap at this time, she is able to follow commands and answer questions appropriately but falls asleep easily and RR decreases below 10 and become more shallow. RT will continue to assess.

## 2019-10-08 NOTE — Consult Note (Signed)
[facility] Renal Consultation Note    Indication for Consultation:  Management of ESRD/hemodialysis; anemia, hypertension/volume and secondary hyperparathyroidism  HPI: Kristen English is a 64 y.o. female with ESRD on HD TTS. PMH also significant for T2DM, HTN, gout,  R renal mass followed by urology.  Previously followed by Dr. Moshe Cipro at [facility] and was planning to start peritoneal dialysis, but found to have possible RCC. She was just discharged from [facility] following admission 7/15-7/20/21 with CKD V/ESRD requiring initiation of dialysis. Her first dialysis was 10/03/19 via R IJ TDC.  She had  RUE AVF placed on 10/07/19 and was scheduled to start outpatient dialysis at [facility] on Thursday.  She went home and had a syncopal episode which she does not remember and returned to the ED via EMS.   ED evaluation notable for ABG with pH 7.133, pCO2 91.2 and she was placed on BiPAP. CXR with mild pulm vascular congestion. Head CT negative.  Labs: K 4.0, BUN 21 Cr 5.82. Covid negative.  Seen and examined in the ED. She is currently off BiBAP. O2 sats 94% on RA. She complains of bilateral leg pain. [place] tech in room to do Doppler study. Her breathing is improved. Denies f,c, cp, sob at present, n/v.   Past Medical History:  Diagnosis Date  . Arthritis   . Chronic kidney disease    protein in urine  . Diabetes mellitus without complication ([place])   . Gout   . Hypertension    Past Surgical History:  Procedure Laterality Date  . AV FISTULA PLACEMENT Right 10/07/2019   Procedure: ARTERIOVENOUS (AV) FISTULA CREATION RIGHT UPPER ARM;  Surgeon: Angelia Mould, MD;  Location: [place];  Service: Vascular;  Laterality: Right;  . COLONOSCOPY WITH PROPOFOL N/A 05/11/2014   Procedure: COLONOSCOPY WITH PROPOFOL;  Surgeon: Garlan Fair, MD;  Location: [facility];  Service: Endoscopy;  Laterality: N/A;  . FOOT SURGERY Left 2007  . IR FLUORO GUIDE CV LINE RIGHT  10/03/2019   . IR US GUIDE VASC ACCESS RIGHT  10/03/2019  . JOINT REPLACEMENT  2009   bilateral knees  . TOTAL KNEE ARTHROPLASTY     bilateral   Family History  Problem Relation Age of Onset  . Heart attack Mother   . Heart attack Father    Social History:  reports that she has never smoked. She has never used smokeless tobacco. She reports that she does not drink alcohol and does not use drugs. Allergies  Allergen Reactions  . Morphine And Related Itching   Prior to Admission medications   Medication Sig Start Date End Date Taking? Authorizing Provider  amLODipine (NORVASC) 10 MG tablet Take 10 mg by mouth daily.    Yes [provider]  aspirin 81 MG tablet Take 81 mg by mouth daily.    Yes [provider]  atorvastatin (LIPITOR) 40 MG tablet Take 40 mg by mouth daily.  11/01/15  Yes [provider]  carvedilol (COREG) 25 MG tablet Take 25 mg by mouth 2 (two) times daily with a meal.  02/24/13  Yes [provider]  colchicine 0.6 MG tablet Take 1 tablet (0.6 mg total) by mouth as needed (for gout flare. Don't repeat dose for another 2 weeks.). 10/07/19  Yes Aline August, MD  doxazosin (CARDURA) 2 MG tablet Take 2 mg by mouth at bedtime.  08/05/19  Yes [provider]  ferrous sulfate (FERROUSUL) 325 (65 FE) MG tablet Take 325 mg by  mouth daily with breakfast.    Yes [provider]  levothyroxine (SYNTHROID) 25 MCG tablet Take 25 mcg by mouth daily. 08/05/19  Yes [provider]  oxyCODONE (ROXICODONE) 5 MG immediate release tablet Take 1 tablet (5 mg total) by mouth every 4 (four) hours as needed. Patient taking differently: Take 5 mg by mouth every 4 (four) hours as needed for moderate pain.  10/07/19   Angelia Mould, MD  glipiZIDE (GLUCOTROL) 5 MG tablet Take 5 mg by mouth 3 (three) times daily with meals.  09/27/19  [provider]   Current Facility-Administered Medications  Medication Dose Route Frequency Provider  Last Rate Last Admin  . acetaminophen (TYLENOL) tablet 650 mg  650 mg Oral Q6H PRN Elgergawy, Silver Huguenin, MD   650 mg at 10/08/19 0850   Or  . acetaminophen (TYLENOL) suppository 650 mg  650 mg Rectal Q6H PRN Elgergawy, Silver Huguenin, MD      . amLODipine (NORVASC) tablet 10 mg  10 mg Oral Daily Elgergawy, Silver Huguenin, MD   10 mg at 10/08/19 1150  . aspirin EC tablet 81 mg  81 mg Oral Daily Elgergawy, Silver Huguenin, MD   81 mg at 10/08/19 1147  . atorvastatin (LIPITOR) tablet 40 mg  40 mg Oral Daily Elgergawy, Silver Huguenin, MD   40 mg at 10/08/19 1150  . carvedilol (COREG) tablet 25 mg  25 mg Oral BID WC Elgergawy, Silver Huguenin, MD      . doxazosin (CARDURA) tablet 2 mg  2 mg Oral QHS Elgergawy, Dawood S, MD      . ferrous sulfate tablet 325 mg  325 mg Oral Q breakfast Elgergawy, Silver Huguenin, MD   325 mg at 10/08/19 0852  . heparin injection 5,000 Units  5,000 Units Subcutaneous Q8H Elgergawy, Silver Huguenin, MD   5,000 Units at 10/08/19 [id number]  . levothyroxine (SYNTHROID) tablet 25 mcg  25 mcg Oral Daily Elgergawy, Silver Huguenin, MD       Current Outpatient Medications  Medication Sig Dispense Refill  . amLODipine (NORVASC) 10 MG tablet Take 10 mg by mouth daily.     Marland Kitchen aspirin 81 MG tablet Take 81 mg by mouth daily.     Marland Kitchen atorvastatin (LIPITOR) 40 MG tablet Take 40 mg by mouth daily.     . carvedilol (COREG) 25 MG tablet Take 25 mg by mouth 2 (two) times daily with a meal.     . colchicine 0.6 MG tablet Take 1 tablet (0.6 mg total) by mouth as needed (for gout flare. Don't repeat dose for another 2 weeks.).    Marland Kitchen doxazosin (CARDURA) 2 MG tablet Take 2 mg by mouth at bedtime.     . ferrous sulfate (FERROUSUL) 325 (65 FE) MG tablet Take 325 mg by mouth daily with breakfast.     . levothyroxine (SYNTHROID) 25 MCG tablet Take 25 mcg by mouth daily.    Marland Kitchen oxyCODONE (ROXICODONE) 5 MG immediate release tablet Take 1 tablet (5 mg total) by mouth every 4 (four) hours as needed. (Patient taking differently: Take 5 mg by mouth every 4 (four)  hours as needed for moderate pain. ) 15 tablet 0     ROS: As per HPI otherwise negative.  Physical Exam: Vitals:   10/08/19 1145 10/08/19 1150 10/08/19 1245 10/08/19 1338  BP: (!) 142/58 (!) 142/58 (!) 144/66   Pulse: 67   64  Resp:    16  Temp:      TempSrc:  SpO2: 99%   95%  Weight:      Height:         General: Lying in bed, alert, nad  Head: NCAT sclera not icteric Neck: Supple. No JVD appreciated  Lungs: Normal WOB. CTA bilaterally without wheezes, rales, or rhonchi. Heart: RRR with S1 S2 Abdomen: obese, soft non-tender  Lower extremities: trace bilateral LE edema, LE tender to palpation bilaterally  Neuro: A & O  X 3. Moves all extremities spontaneously. Psych:  Responds to questions appropriately with a normal affect. Dialysis Access: R IJ TDC dsg c,d,i . New RUE AVF +bruit   Labs: Basic Metabolic Panel: Recent Labs  Lab 10/05/19 0540 10/05/19 0540 10/06/19 0318 10/06/19 0318 10/07/19 0223 10/07/19 0223 10/08/19 0259 10/08/19 0415 10/08/19 0442 10/08/19 0801  NA 139  139   < > 139   < > 140   < > 141 139 141  --   K 3.2*  3.1*   < > 3.8   < > 3.5   < > 4.1 4.2 4.0  --   CL 104  104   < > 107  --  106  --   --  105  --   --   CO2 26  26   < > 23  --  29  --   --  26  --   --   GLUCOSE 109*  109*   < > 133*  --  131*  --   --  195*  --   --   BUN 18  19   < > 25*  --  14  --   --  21  --   --   CREATININE 3.98*  3.93*   < > 5.68*   < > 4.46*  --   --  5.82*  --  5.91*  CALCIUM 8.3*  8.2*   < > 9.3  --  9.3  --   --  9.7  --   --   PHOS 3.6  --  6.5*  --  5.4*  --   --   --   --   --    < > = values in this interval not displayed.   Liver Function Tests: Recent Labs  Lab 10/05/19 0540 10/05/19 0540 10/06/19 0318 10/07/19 0223 10/08/19 0415  AST 16  --   --   --  26  ALT 12  --   --   --  10  ALKPHOS 55  --   --   --  49  BILITOT 0.4  --   --   --  0.3  PROT 5.1*  --   --   --  4.9*  ALBUMIN 2.0*  1.9*   < > 1.9* 1.8* 1.9*   < > =  values in this interval not displayed.   No results for input(s): LIPASE, AMYLASE in the last 168 hours. No results for input(s): AMMONIA in the last 168 hours. CBC: Recent Labs  Lab 10/03/19 0153 10/03/19 0153 10/04/19 0449 10/04/19 0449 10/05/19 0540 10/05/19 0540 10/07/19 0223 10/07/19 1833 10/08/19 0259 10/08/19 0415 10/08/19 0442  WBC 6.8   < > 6.4   < > 6.3  --  6.5  --   --  10.1  --   NEUTROABS  --   --  4.7  --  4.2  --   --   --   --  9.0*  --   HGB 7.2*   < >  7.2*   < > 7.3*   < > 6.8*   < > 8.5* 7.7* 9.9*  HCT 26.2*   < > 25.0*   < > 25.4*   < > 24.7*   < > 25.0* 28.1* 29.0*  MCV 97.0  --  93.6  --  93.4  --  96.9  --   --  100.0  --   PLT 131*   < > 119*   < > 122*  --  106*  --   --  116*  --    < > = values in this interval not displayed.   Cardiac Enzymes: No results for input(s): CKTOTAL, CKMB, CKMBINDEX, TROPONINI in the last 168 hours. CBG: Recent Labs  Lab 10/07/19 0740 10/07/19 1016 10/07/19 1423 10/07/19 1703 10/08/19 0827  GLUCAP 116* 113* 101* 131* 138*   Iron Studies: No results for input(s): IRON, TIBC, TRANSFERRIN, FERRITIN in the last 72 hours. Studies/Results: CT Head Wo Contrast  Result Date: 10/08/2019 CLINICAL DATA:  Syncope EXAM: CT HEAD WITHOUT CONTRAST TECHNIQUE: Contiguous axial images were obtained from the base of the skull through the vertex without intravenous contrast. COMPARISON:  None. FINDINGS: Brain: No acute infarct or hemorrhage. Lateral ventricles and midline structures are unremarkable. No acute extra-axial fluid collections. No mass effect. Vascular: No hyperdense vessel or unexpected calcification. Skull: Normal. Negative for fracture or focal lesion. Sinuses/Orbits: No acute finding. Other: None. IMPRESSION: 1. No acute intracranial process. Electronically Signed   By: Randa Ngo M.D.   On: 10/08/2019 01:08   CT Cervical Spine Wo Contrast  Result Date: 10/08/2019 CLINICAL DATA:  Syncope EXAM: CT CERVICAL SPINE  WITHOUT CONTRAST TECHNIQUE: Multidetector CT imaging of the cervical spine was performed without intravenous contrast. Multiplanar CT image reconstructions were also generated. COMPARISON:  None. FINDINGS: Alignment: There is reversal of the normal cervical lordosis. Skull base and vertebrae: Visualized skull base is intact. No atlanto-occipital dissociation. The vertebral body heights are well maintained. No fracture or pathologic osseous lesion seen. Soft tissues and spinal canal: The visualized paraspinal soft tissues are unremarkable. No prevertebral soft tissue swelling is seen. The spinal canal is grossly unremarkable, no large epidural collection or significant canal narrowing. Disc levels: Disc height loss with anterior osteophytes disc osteophyte complex and uncovertebral osteophytes is most notable at C4-C5 and C5-C6 with moderate neural foraminal narrowing and mild effacement of the anterior thecal sac. Upper chest: The lung apices are clear. Thoracic inlet is within normal limits. Scattered aortic atherosclerosis is seen. Other: None IMPRESSION: No acute fracture or malalignment of the spine. Aortic Atherosclerosis (ICD10-I70.0). Electronically Signed   By: Prudencio Pair M.D.   On: 10/08/2019 01:21   DG Chest Portable 1 View  Result Date: 10/08/2019 CLINICAL DATA:  Cough EXAM: PORTABLE CHEST 1 VIEW COMPARISON:  March 26, 2007 FINDINGS: There is mild cardiomegaly. A right-sided central venous catheter seen with the tip at the superior cavoatrial junction. There is mild prominence of the central pulmonary vasculature. There is patchy airspace opacity seen within the retrocardiac region. The visualized skeletal structures are unremarkable. IMPRESSION: cardiomegaly and mild pulmonary vascular congestion. Patchy airspace opacity in the retrocardiac region which could be due to atelectasis and/or early infectious etiology Electronically Signed   By: Prudencio Pair M.D.   On: 10/08/2019 01:03    Dialysis  Orders:  [facility] TTS - 1st outpatient tmt was scheduled for 7/22  Assessment/Plan: 1. Acute hypercarbic respiratory failure. Improved with BiPAP support. Off BiPAP/O2 at time of my exam.  Per primary team -to f/u for sleep study as outpatient  2. ESRD -  HD TTS. HD started 10/03/19.  Using [facility]. RUE BCF placed 7/20. Continue HD on schedule. Next HD 7/22.  3. Hypertension/volume  - On amlodipine 10,  carvedilol 25 bid. Establishing dry weight. Does have volume on exam. UF 3L next HD.  4. Anemia  - Holding ESA 2/2 possible RCC. Tsat 40%. Transfuse prn.  5. Metabolic bone disease -  No binders currently. Follow Ca/Phos.  6. R Renal mass  - followed by urology as outpatient, likely to req nephrectomy  Lynnda Child PA-C [facility] 10/08/2019, 2:07 PM

## 2019-10-08 NOTE — Progress Notes (Signed)
Pt presenting with acute respiratory failure w/ hypercapnia. She H&P for full details. Nephro and PCCM consulted. Appreciate assistance. Awaiting Echo read. Otherwise, continue as per H&P from this morning.   General: 64 y.o. female resting in bed in NAD on BiPap  Cardiovascular: RRR, +S1, S2, no m/g/r, equal pulses throughout Respiratory: BiPap sounds, comfortable and in sync GI: BS+, NDNT, no masses noted, no organomegaly noted MSK: No c/c; trace BLE edema Neuro: A&O x 3, no focal deficits Psyc: Appropriate interaction and affect, calm/cooperative   A/P Acute respiratory failure with hypercapnia     - This is most likely related to underlying OHS/OSA, she never had sleep study in the past, her ABG showing pH of 7.1, PCO2 of 91     - for now we will continue with BiPAP, even with that sometimes patient becomes apneic and respiratory distress as discussed with RT, especially if she goes to sleep, otherwise when she is awake she appears to be comfortable.     - PCCM has eval'd; appreciate assistance     - Echo pending     - she remains on BiPap for now  End-stage renal disease     - she was started hemodialysis on recent admission, will have morning team to consult renal to resume dialysis on TTS schedule.     - s/p rue brachiocephalic avf 0/09/6224 wby Vascular surgery.     - Consulted nephrology; appreciate assistance   Diabetes mellitus type 2 with recent episodes of hypoglycemia     - Last A1c 5.4.     - We will keep her n.p.o. given she is on BiPAP.     - follow glucose for now  Anemia of chronic disease     - Patient did require PRBC transfusion during recent hospital stay, hemoglobin is 7.7 on admission, which is around her baseline.  Essential hypertension     - Continue amlodipine, Coreg, doxazosin.  Hypothyroidism     - continue levothyroxine  Morbid obesity     - Outpatient follow-up

## 2019-10-08 NOTE — Progress Notes (Signed)
[name] [id number] Admission Data: 10/08/2019 6:06 PM Attending Provider: Jonnie Finner, DO  VDI:[id number], [place] Ar, MD Consults/ Treatment Team: Treatment Team:  Claudia Desanctis, MD  Kristen English is a 64 y.o. female patient admitted from ED awake, alert  & orientated  X 3,  Full Code, VSS - Blood pressure (!) 153/60, pulse 77, temperature 98 F (36.7 C), temperature source Oral, resp. rate 11, height 5\' 8"  (1.727 m), weight 131.1 kg, SpO2 93 %., O2  2 L nasal cannular, no c/o shortness of breath, no c/o chest pain, no distress noted. Tele # 8 placed and pt is currently running:normal sinus rhythm.   IV site WDL:   Left shoulder, condition patent and no redness with a transparent dsg that's clean dry and intact.    Pt orientation to unit, room and routine. Information packet given to patient/family and safety video watched.  Admission INP armband ID verified with patient/family, and in place. SR up x 2, fall risk assessment complete with Patient and family verbalizing understanding of risks associated with falls. Pt verbalizes an understanding of how to use the call bell and to call for help before getting out of bed.  Skin, clean-dry- intact without evidence of bruising, or skin tears.   No evidence of skin break down noted on exam.     Will cont to monitor and assist as needed.  Hosie Spangle, [place] 10/08/2019 6:06 PM

## 2019-10-08 NOTE — ED Provider Notes (Signed)
[facility] DEPARTMENT Provider Note   CSN: [id number] Arrival date & time: 10/08/19  0012     History Chief Complaint  Patient presents with  . Loss of Consciousness    EARA BURRUEL is a 64 y.o. female.  Patient brought in by EMS after episode of loss of consciousness and unresponsive episode.  She had just been discharged from the inpatient service with ESRD and creation of new dialysis fistula.  She was apparently home for about 4 hours.  She was left in her bed by her son and then found unresponsive on the bathroom floor.  Patient states she remembers lying in her bed and having a coughing spell with some specks of blood in her cough.  The next thing she knows she says her son was standing over her telling her to wake up.  She thinks she must of fallen out of bed.  Is not certain if she hit her head.  EMS reports they had to sternal rub her and she responded though initial GCS was 12.  Her vitals were stable and her CBG was 244.  Patient did not take any of the prescribed pain medication.  She denies any chest pain or shortness of breath.  She denies any abdominal pain, nausea, vomiting.  No headache.  No neck pain or back pain. She believes she was awake the entire time and remembers EMS arriving.  No tongue biting or incontinence or reported seizure activity.  The history is provided by the patient and the EMS personnel.  Loss of Consciousness Associated symptoms: no chest pain, no fever, no nausea, no shortness of breath, no vomiting and no weakness        Past Medical History:  Diagnosis Date  . Arthritis   . Chronic kidney disease    protein in urine  . Diabetes mellitus without complication ([place])   . Gout   . Hypertension     Patient Active Problem List   Diagnosis Date Noted  . AKI (acute kidney injury) ([place]) 10/02/2019  . Essential hypertension 10/02/2019  . Normocytic anemia 10/02/2019  . Normal anion gap metabolic acidosis  67/89/3810  . Hypoglycemia associated with type 2 diabetes mellitus ([place]) 09/27/2019  . Chronic renal insufficiency, stage 4 (severe) ([place]) 09/05/2018  . Plasma cell dyscrasia 04/27/2014  . 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 COLONOSCOPY WITH PROPOFOL N/A 05/11/2014   Procedure: COLONOSCOPY WITH PROPOFOL;  Surgeon: Garlan Fair, MD;  Location: [facility] ENDOSCOPY;  Service: Endoscopy;  Laterality: N/A;  . FOOT SURGERY Left 2007  . IR FLUORO GUIDE CV LINE RIGHT  10/03/2019  . IR US GUIDE VASC ACCESS RIGHT  10/03/2019  . JOINT REPLACEMENT  2009   bilateral knees  . TOTAL KNEE ARTHROPLASTY     bilateral     OB History   No obstetric history on file.     Family History  Problem Relation Age of Onset  . Heart attack Mother   . Heart attack Father     Social History   Tobacco Use  . Smoking status: Never Smoker  . Smokeless tobacco: Never Used  Substance Use Topics  . Alcohol use: No  . Drug use: No    Home Medications Prior to Admission medications   Medication Sig Start Date End Date Taking? Authorizing Provider  amLODipine (NORVASC) 10 MG tablet Take 10 mg by mouth at bedtime.  [provider]  aspirin 81 MG tablet Take 81 mg by mouth daily.     [provider]  atorvastatin (LIPITOR) 40 MG tablet Take 40 mg by mouth daily.  11/01/15   [provider]  carvedilol (COREG) 25 MG tablet Take 25 mg by mouth 2 (two) times daily with a meal.  02/24/13   [provider]  colchicine 0.6 MG tablet Take 1 tablet (0.6 mg total) by mouth as needed (for gout flare. Don't repeat dose for another 2 weeks.). 10/07/19   Aline August, MD  doxazosin (CARDURA) 2 MG tablet Take 2 mg by mouth at bedtime.  08/05/19   [provider]  ferrous sulfate (FERROUSUL) 325 (65 FE) MG tablet Take 325 mg by mouth daily with breakfast.     [provider]  levothyroxine  (SYNTHROID) 25 MCG tablet Take 25 mcg by mouth daily. 08/05/19   [provider]  oxyCODONE (ROXICODONE) 5 MG immediate release tablet Take 1 tablet (5 mg total) by mouth every 4 (four) hours as needed. 10/07/19   Angelia Mould, MD  glipiZIDE (GLUCOTROL) 5 MG tablet Take 5 mg by mouth 3 (three) times daily with meals.  09/27/19  [provider]    Allergies    Morphine and related  Review of Systems   Review of Systems  Constitutional: Negative for activity change, appetite change and fever.  HENT: Negative for congestion and rhinorrhea.   Respiratory: Negative for chest tightness and shortness of breath.   Cardiovascular: Positive for syncope. Negative for chest pain.  Gastrointestinal: Negative for abdominal pain, nausea and vomiting.  Genitourinary: Negative for dysuria and hematuria.  Musculoskeletal: Negative for arthralgias and myalgias.  Skin: Negative for rash.  Neurological: Positive for syncope. Negative for weakness.   all other systems are negative except as noted in the HPI and PMH.    Physical Exam Updated Vital Signs BP (!) 126/55   Pulse 84   Temp 98.8 F (37.1 C) (Oral)   Resp 20   Ht 5' 8.5" (1.74 m)   Wt 131.6 kg   SpO2 100%   BMI 43.47 kg/m   Physical Exam Vitals and nursing note reviewed.  Constitutional:      General: She is not in acute distress.    Appearance: She is well-developed. She is obese.  HENT:     Head: Normocephalic and atraumatic.     Mouth/Throat:     Pharynx: No oropharyngeal exudate.  Eyes:     Conjunctiva/sclera: Conjunctivae normal.     Pupils: Pupils are equal, round, and reactive to light.  Neck:     Comments: No C-spine tenderness Cardiovascular:     Rate and Rhythm: Normal rate and regular rhythm.     Heart sounds: Normal heart sounds. No murmur heard.      Comments: Dialysis catheter right chest Pulmonary:     Effort: Pulmonary effort is normal. No respiratory distress.     Breath sounds:  Normal breath sounds.  Abdominal:     Palpations: Abdomen is soft.     Tenderness: There is no abdominal tenderness. There is no guarding or rebound.  Musculoskeletal:        General: No tenderness. Normal range of motion.     Cervical back: Normal range of motion and neck supple.     Comments: Healing surgical incisions right upper arm with thrill present No T or L-spine tenderness  Skin:    General: Skin is warm.  Neurological:  Mental Status: She is alert and oriented to person, place, and time.     Cranial Nerves: No cranial nerve deficit.     Motor: No abnormal muscle tone.     Coordination: Coordination normal.     Comments:  5/5 strength throughout. CN 2-12 intact.Equal grip strength.   Psychiatric:        Behavior: Behavior normal.     ED Results / Procedures / Treatments   Labs (all labs ordered are listed, but only abnormal results are displayed) Labs Reviewed  BRAIN NATRIURETIC PEPTIDE - Abnormal; Notable for the following components:      Result Value   B Natriuretic Peptide 185.3 (*)    All other components within normal limits  CBC WITH DIFFERENTIAL/PLATELET - Abnormal; Notable for the following components:   RBC 2.81 (*)    Hemoglobin 7.7 (*)    HCT 28.1 (*)    MCHC 27.4 (*)    Platelets 116 (*)    Neutro Abs 9.0 (*)    Lymphs Abs 0.5 (*)    All other components within normal limits  COMPREHENSIVE METABOLIC PANEL - Abnormal; Notable for the following components:   Glucose, Bld 195 (*)    Creatinine, Ser 5.82 (*)    Total Protein 4.9 (*)    Albumin 1.9 (*)    GFR calc non Af Amer 7 (*)    GFR calc Af Amer 8 (*)    All other components within normal limits  I-STAT ARTERIAL BLOOD GAS, ED - Abnormal; Notable for the following components:   pH, Arterial 7.200 (*)    pCO2 arterial 76.4 (*)    pO2, Arterial 44 (*)    Bicarbonate 29.9 (*)    Calcium, Ion 1.44 (*)    HCT 25.0 (*)    Hemoglobin 8.5 (*)    All other components within normal limits    I-STAT ARTERIAL BLOOD GAS, ED - Abnormal; Notable for the following components:   pH, Arterial 7.133 (*)    pCO2 arterial 91.2 (*)    Bicarbonate 30.5 (*)    TCO2 33 (*)    Calcium, Ion 1.47 (*)    HCT 29.0 (*)    Hemoglobin 9.9 (*)    All other components within normal limits  CBG MONITORING, ED - Abnormal; Notable for the following components:   Glucose-Capillary 138 (*)    All other components within normal limits  TROPONIN I (HIGH SENSITIVITY) - Abnormal; Notable for the following components:   Troponin I (High Sensitivity) 20 (*)    All other components within normal limits  SARS CORONAVIRUS 2 BY RT PCR (HOSPITAL ORDER, [facility])  CBC WITH DIFFERENTIAL/PLATELET  RAPID URINE DRUG SCREEN, HOSP PERFORMED  URINALYSIS, ROUTINE W REFLEX MICROSCOPIC  CBC  CREATININE, SERUM  TROPONIN I (HIGH SENSITIVITY)    EKG EKG Interpretation  Date/Time:  Wednesday October 08 2019 00:36:47 EDT Ventricular Rate:  88 PR Interval:    QRS Duration: 126 QT Interval:  366 QTC Calculation: 443 R Axis:   -4 Text Interpretation: Sinus rhythm Ventricular premature complex Nonspecific intraventricular conduction delay Borderline ST elevation, anterior leads No significant change was found Confirmed by Ezequiel Essex [phone]) on 10/08/2019 12:39:26 AM   Radiology CT Head Wo Contrast  Result Date: 10/08/2019 CLINICAL DATA:  Syncope EXAM: CT HEAD WITHOUT CONTRAST TECHNIQUE: Contiguous axial images were obtained from the base of the skull through the vertex without intravenous contrast. COMPARISON:  None. FINDINGS: Brain: No acute infarct or  hemorrhage. Lateral ventricles and midline structures are unremarkable. No acute extra-axial fluid collections. No mass effect. Vascular: No hyperdense vessel or unexpected calcification. Skull: Normal. Negative for fracture or focal lesion. Sinuses/Orbits: No acute finding. Other: None. IMPRESSION: 1. No acute intracranial process.  Electronically Signed   By: Randa Ngo M.D.   On: 10/08/2019 01:08   CT Cervical Spine Wo Contrast  Result Date: 10/08/2019 CLINICAL DATA:  Syncope EXAM: CT CERVICAL SPINE WITHOUT CONTRAST TECHNIQUE: Multidetector CT imaging of the cervical spine was performed without intravenous contrast. Multiplanar CT image reconstructions were also generated. COMPARISON:  None. FINDINGS: Alignment: There is reversal of the normal cervical lordosis. Skull base and vertebrae: Visualized skull base is intact. No atlanto-occipital dissociation. The vertebral body heights are well maintained. No fracture or pathologic osseous lesion seen. Soft tissues and spinal canal: The visualized paraspinal soft tissues are unremarkable. No prevertebral soft tissue swelling is seen. The spinal canal is grossly unremarkable, no large epidural collection or significant canal narrowing. Disc levels: Disc height loss with anterior osteophytes disc osteophyte complex and uncovertebral osteophytes is most notable at C4-C5 and C5-C6 with moderate neural foraminal narrowing and mild effacement of the anterior thecal sac. Upper chest: The lung apices are clear. Thoracic inlet is within normal limits. Scattered aortic atherosclerosis is seen. Other: None IMPRESSION: No acute fracture or malalignment of the spine. Aortic Atherosclerosis (ICD10-I70.0). Electronically Signed   By: Prudencio Pair M.D.   On: 10/08/2019 01:21   DG Chest Portable 1 View  Result Date: 10/08/2019 CLINICAL DATA:  Cough EXAM: PORTABLE CHEST 1 VIEW COMPARISON:  March 26, 2007 FINDINGS: There is mild cardiomegaly. A right-sided central venous catheter seen with the tip at the superior cavoatrial junction. There is mild prominence of the central pulmonary vasculature. There is patchy airspace opacity seen within the retrocardiac region. The visualized skeletal structures are unremarkable. IMPRESSION: cardiomegaly and mild pulmonary vascular congestion. Patchy airspace opacity  in the retrocardiac region which could be due to atelectasis and/or early infectious etiology Electronically Signed   By: Prudencio Pair M.D.   On: 10/08/2019 01:03    Procedures .Critical Care Performed by: Ezequiel Essex, MD Authorized by: Ezequiel Essex, MD   Critical care provider statement:    Critical care time (minutes):  45   Critical care was necessary to treat or prevent imminent or life-threatening deterioration of the following conditions:  Respiratory failure   Critical care was time spent personally by me on the following activities:  Discussions with consultants, evaluation of patient's response to treatment, examination of patient, ordering and performing treatments and interventions, ordering and review of laboratory studies, ordering and review of radiographic studies, pulse oximetry, re-evaluation of patient's condition, obtaining history from patient or surrogate and review of old charts   (including critical care time)  Medications Ordered in ED Medications - No data to display  ED Course  I have reviewed the triage vital signs and the nursing notes.  Pertinent labs & imaging results that were available during my care of the patient were reviewed by me and considered in my medical decision making (see chart for details).    MDM Rules/Calculators/A&P                          Patient recently discharged in the hospital with new ESRD.  Here with unresponsive episode no loss of consciousness.  Now awake and responding neurologically intact.  Vitals are stable.  EKG is sinus rhythm.  Patient is awake and neurologically intact.  CT head and C-spine are negative.  Chest x-ray shows mild vascular congestion.  She does have a dry cough which she attributes to being under anesthesia yesterday.  Initial blood gas was obtained to evaluate for CO2 retention which showed a pH of 7.2 and a PCO2 of 75 with PO2 of 45.  However patient was very awake and appeared to be back to  baseline per her son in the room.  Patient had loss of consciousness at home but does not recall how she got from her bed to the bathroom floor.  She suspects that she was asleep and rolled out of bed.  Her son states she was unresponsive on the floor for several minutes.  On repeat ABG, PCO2 is worsened to 90 with pH of 7.1.  Mental status is declining as well.  Will place on BiPAP. Patient states she has never been diagnosed with sleep apnea.  Bipap continued. Suspect some component of OSA/obesity hypoventilation syndrome. Her minute ventilation drops when she sleeps, but improves when she is awake.   Suspect she had similar episode at home with CO2 retention. Will need admission for CO2 narcosis, continue bipap.  D/w Dr. Waldron Labs. Final Clinical Impression(s) / ED Diagnoses Final diagnoses:  None    Rx / DC Orders ED Discharge Orders    None       Macai Sisneros, Annie Main, MD 10/08/19 [phone]

## 2019-10-08 NOTE — ED Notes (Signed)
Pt st's she has been coughing since her surg. Yesterday.  St's tonight she started coughing and the next thing she remembers is her son telling her to get up.  Pt st's she remembers EMS being called and helping her up.   On arrival to ED pt was on a NRM at 15 LPM.  Pt changed to [place] at 3 LPM

## 2019-10-08 NOTE — Plan of Care (Signed)
  Problem: Education: Goal: Knowledge of General Education information will improve Description: Including pain rating scale, medication(s)/side effects and non-pharmacologic comfort measures Outcome: Progressing   Problem: Health Behavior/Discharge Planning: Goal: Ability to manage health-related needs will improve Outcome: Progressing   Problem: Activity: Goal: Risk for activity intolerance will decrease Outcome: Progressing   Problem: Nutrition: Goal: Adequate nutrition will be maintained Outcome: Progressing   Problem: Coping: Goal: Level of anxiety will decrease Outcome: Progressing   Problem: Elimination: Goal: Will not experience complications related to bowel motility Outcome: Progressing   Problem: Pain Managment: Goal: General experience of comfort will improve Outcome: Progressing   Problem: Elimination: Goal: Will not experience complications related to urinary retention Outcome: Not Applicable   Problem: Safety: Goal: Ability to remain free from injury will improve Outcome: Not Applicable   Problem: Skin Integrity: Goal: Risk for impaired skin integrity will decrease Outcome: Not Applicable

## 2019-10-08 NOTE — H&P (Signed)
TRH H&P   Patient Demographics:    Kristen English, is a 64 y.o. female  MRN: [id number]   DOB - 07/08/1955  Admit Date - 10/08/2019  Outpatient Primary MD for the patient is Wenda Low, MD  Referring MD/NP/PA: Dr Wyvonnia Dusky  Patient coming from: Home  Chief Complaint  Patient presents with  . Loss of Consciousness      HPI:    Kristen English  is a 64 y.o. female, past medical history of diabetes mellitus, hypertension, recent hospitalization where she was diagnosed with ESRD, status post new dialysis fistula, which she was started on hemodialysis, on TTS schedule, patient was discharged home yesterday evening, apparently she was at home for 4 hours, her son put her in bed to sleep, and then he comes back to see her, he found her unresponsive on the floor, patient had loss of consciousness, remembers waking up son trying to wake her up, thinks she might fell out of the bed, she does not recall if she hit her head, EMS report they had to do sternal rub for her to wake up, initial GCS was 12, CBG was 244, she denies any chest pain, shortness of breath, fever, chills, nausea or vomiting there was no seizure activity, urinary or stool incontinence.  - in ED patient has significantly improved, ABG initially was with pH of 7.2, PCO2 of 76, repeat ABG was pH 7.1, PCO2 of 91, despite that patient was awake, she was started on BiPAP , while patient was on BiPAP, she went and slept, and per RT, patient became apneic despite being on BiPAP, with increased work of breathing, this has resolved after she woke up, her work-up significant for creatinine of 5.8, she is due for dialysis on Thursday, her hemoglobin was 7.7, around her baseline, she was transfused 1 unit PRBC yesterday before discharge, triage hospitalist consulted to admit.    Review of systems:    In addition to the HPI above No  Fever-chills, patient presents with an episode of unconsciousness, No Headache, No changes with Vision or hearing, No problems swallowing food or Liquids, No Chest pain, Cough or Shortness of Breath, No Abdominal pain, No Nausea or Vommitting, Bowel movements are regular, No Blood in stool or Urine, No dysuria, No new skin rashes or bruises, No new joints pains-aches,  No new weakness, tingling, numbness in any extremity, No recent weight gain or loss, No polyuria, polydypsia or polyphagia, No significant Mental Stressors.  A full 10 point Review of Systems was done, except as stated above, all other Review of Systems were negative.   With Past History of the following :    Past Medical History:  Diagnosis Date  . Arthritis   . Chronic kidney disease    protein in urine  . Diabetes mellitus without complication ([place])   . Gout   . Hypertension  Past Surgical History:  Procedure Laterality Date  . COLONOSCOPY WITH PROPOFOL N/A 05/11/2014   Procedure: COLONOSCOPY WITH PROPOFOL;  Surgeon: Garlan Fair, MD;  Location: [facility];  Service: Endoscopy;  Laterality: N/A;  . FOOT SURGERY Left 2007  . IR FLUORO GUIDE CV LINE RIGHT  10/03/2019  . IR US GUIDE VASC ACCESS RIGHT  10/03/2019  . JOINT REPLACEMENT  2009   bilateral knees  . TOTAL KNEE ARTHROPLASTY     bilateral      Social History:     Social History   Tobacco Use  . Smoking status: Never Smoker  . Smokeless tobacco: Never Used  Substance Use Topics  . Alcohol use: No       Family History :     Family History  Problem Relation Age of Onset  . Heart attack Mother   . Heart attack Father      Home Medications:   Prior to Admission medications   Medication Sig Start Date End Date Taking? Authorizing Provider  amLODipine (NORVASC) 10 MG tablet Take 10 mg by mouth daily.    Yes [provider]  aspirin 81 MG tablet Take 81 mg by mouth daily.    Yes [provider]    atorvastatin (LIPITOR) 40 MG tablet Take 40 mg by mouth daily.  11/01/15  Yes [provider]  carvedilol (COREG) 25 MG tablet Take 25 mg by mouth 2 (two) times daily with a meal.  02/24/13  Yes [provider]  colchicine 0.6 MG tablet Take 1 tablet (0.6 mg total) by mouth as needed (for gout flare. Don't repeat dose for another 2 weeks.). 10/07/19  Yes Aline August, MD  doxazosin (CARDURA) 2 MG tablet Take 2 mg by mouth at bedtime.  08/05/19  Yes [provider]  ferrous sulfate (FERROUSUL) 325 (65 FE) MG tablet Take 325 mg by mouth daily with breakfast.    Yes [provider]  levothyroxine (SYNTHROID) 25 MCG tablet Take 25 mcg by mouth daily. 08/05/19  Yes [provider]  oxyCODONE (ROXICODONE) 5 MG immediate release tablet Take 1 tablet (5 mg total) by mouth every 4 (four) hours as needed. Patient taking differently: Take 5 mg by mouth every 4 (four) hours as needed for moderate pain.  10/07/19   Angelia Mould, MD  glipiZIDE (GLUCOTROL) 5 MG tablet Take 5 mg by mouth 3 (three) times daily with meals.  09/27/19  [provider]     Allergies:     Allergies  Allergen Reactions  . Morphine And Related Itching     Physical Exam:   Vitals  Blood pressure (!) 150/57, pulse 74, temperature 98.8 F (37.1 C), temperature source Oral, resp. rate (!) 23, height 5\' 8"  (1.727 m), weight 131.1 kg, SpO2 100 %.   1. General obese female, laying in bed, in mild discomfort secondary to dyspnea, on BiPAP.  2. Normal affect and insight, Not Suicidal or Homicidal, Awake Alert, Oriented X 3.  3. No F.N deficits, ALL C.Nerves Intact, Strength 5/5 all 4 extremities, Sensation intact all 4 extremities, Plantars down going.  4. Ears and Eyes appear Normal, Conjunctivae clear, PERRLA.  5. Supple Neck, No JVD, No cervical lymphadenopathy appriciated, No Carotid Bruits.  6. Symmetrical Chest wall movement, Good air movement bilaterally,  scattered rhonchi  7. RRR, No Gallops, Rubs or Murmurs, No Parasternal Heave.  +1 edema  8. Positive Bowel Sounds, Abdomen Soft, No tenderness, No organomegaly appriciated,No rebound -guarding or rigidity.  9.  No Cyanosis, Normal Skin Turgor, No Skin Rash or Bruise.  10. Good muscle tone,  joints appear normal , no effusions, Normal ROM.  11. No Palpable Lymph Nodes in Neck or Axillae    Data Review:    CBC Recent Labs  Lab 10/03/19 0153 10/03/19 0153 10/04/19 0449 10/04/19 0449 10/05/19 0540 10/05/19 0540 10/07/19 0223 10/07/19 1833 10/08/19 0259 10/08/19 0415 10/08/19 0442  WBC 6.8  --  6.4  --  6.3  --  6.5  --   --  10.1  --   HGB 7.2*   < > 7.2*   < > 7.3*   < > 6.8* 8.1* 8.5* 7.7* 9.9*  HCT 26.2*   < > 25.0*   < > 25.4*   < > 24.7* 29.0* 25.0* 28.1* 29.0*  PLT 131*  --  119*  --  122*  --  106*  --   --  116*  --   MCV 97.0  --  93.6  --  93.4  --  96.9  --   --  100.0  --   MCH 26.7  --  27.0  --  26.8  --  26.7  --   --  27.4  --   MCHC 27.5*  --  28.8*  --  28.7*  --  27.5*  --   --  27.4*  --   RDW 16.5*  --  16.2*  --  16.1*  --  15.4  --   --  15.3  --   LYMPHSABS  --   --  0.7  --  1.0  --   --   --   --  0.5*  --   MONOABS  --   --  0.6  --  0.6  --   --   --   --  0.6  --   EOSABS  --   --  0.3  --  0.5  --   --   --   --  0.0  --   BASOSABS  --   --  0.0  --  0.0  --   --   --   --  0.0  --    < > = values in this interval not displayed.   ------------------------------------------------------------------------------------------------------------------  Chemistries  Recent Labs  Lab 10/04/19 0449 10/04/19 0449 10/05/19 0540 10/05/19 0540 10/06/19 0318 10/07/19 0223 10/08/19 0259 10/08/19 0415 10/08/19 0442  NA 142   < > 139  139   < > 139 140 141 139 141  K 3.5   < > 3.2*  3.1*   < > 3.8 3.5 4.1 4.2 4.0  CL 111  --  104  104  --  107 106  --  105  --   CO2 22  --  26  26  --  23 29  --  26  --   GLUCOSE 167*  --  109*  109*  --  133*  131*  --  195*  --   BUN 40*  --  18  19  --  25* 14  --  21  --   CREATININE 7.04*  --  3.98*  3.93*  --  5.68* 4.46*  --  5.82*  --   CALCIUM 9.0  --  8.3*  8.2*  --  9.3 9.3  --  9.7  --   AST  --   --  16  --   --   --   --  26  --   ALT  --   --  12  --   --   --   --  10  --   ALKPHOS  --   --  55  --   --   --   --  49  --   BILITOT  --   --  0.4  --   --   --   --  0.3  --    < > = values in this interval not displayed.   ------------------------------------------------------------------------------------------------------------------ estimated creatinine clearance is 13.8 mL/min (A) (by C-G formula based on SCr of 5.82 mg/dL (H)). ------------------------------------------------------------------------------------------------------------------ No results for input(s): TSH, T4TOTAL, T3FREE, THYROIDAB in the last 72 hours.  Invalid input(s): FREET3  Coagulation profile No results for input(s): INR, PROTIME in the last 168 hours. ------------------------------------------------------------------------------------------------------------------- No results for input(s): DDIMER in the last 72 hours. -------------------------------------------------------------------------------------------------------------------  Cardiac Enzymes No results for input(s): CKMB, TROPONINI, MYOGLOBIN in the last 168 hours.  Invalid input(s): CK ------------------------------------------------------------------------------------------------------------------    Component Value Date/Time   BNP 185.3 (H) 10/08/2019 0149     ---------------------------------------------------------------------------------------------------------------  Urinalysis    Component Value Date/Time   COLORURINE YELLOW 10/02/2019 1940   APPEARANCEUR HAZY (A) 10/02/2019 1940   LABSPEC 1.020 10/02/2019 1940   PHURINE 5.0 10/02/2019 1940   GLUCOSEU >=500 (A) 10/02/2019 1940   HGBUR SMALL (A) 10/02/2019 1940    BILIRUBINUR NEGATIVE 10/02/2019 1940   KETONESUR NEGATIVE 10/02/2019 1940   PROTEINUR >=300 (A) 10/02/2019 1940   UROBILINOGEN 0.2 02/20/2008 1241   NITRITE NEGATIVE 10/02/2019 1940   LEUKOCYTESUR NEGATIVE 10/02/2019 1940    ----------------------------------------------------------------------------------------------------------------   Imaging Results:    CT Head Wo Contrast  Result Date: 10/08/2019 CLINICAL DATA:  Syncope EXAM: CT HEAD WITHOUT CONTRAST TECHNIQUE: Contiguous axial images were obtained from the base of the skull through the vertex without intravenous contrast. COMPARISON:  None. FINDINGS: Brain: No acute infarct or hemorrhage. Lateral ventricles and midline structures are unremarkable. No acute extra-axial fluid collections. No mass effect. Vascular: No hyperdense vessel or unexpected calcification. Skull: Normal. Negative for fracture or focal lesion. Sinuses/Orbits: No acute finding. Other: None. IMPRESSION: 1. No acute intracranial process. Electronically Signed   By: Randa Ngo M.D.   On: 10/08/2019 01:08   CT Cervical Spine Wo Contrast  Result Date: 10/08/2019 CLINICAL DATA:  Syncope EXAM: CT CERVICAL SPINE WITHOUT CONTRAST TECHNIQUE: Multidetector CT imaging of the cervical spine was performed without intravenous contrast. Multiplanar CT image reconstructions were also generated. COMPARISON:  None. FINDINGS: Alignment: There is reversal of the normal cervical lordosis. Skull base and vertebrae: Visualized skull base is intact. No atlanto-occipital dissociation. The vertebral body heights are well maintained. No fracture or pathologic osseous lesion seen. Soft tissues and spinal canal: The visualized paraspinal soft tissues are unremarkable. No prevertebral soft tissue swelling is seen. The spinal canal is grossly unremarkable, no large epidural collection or significant canal narrowing. Disc levels: Disc height loss with anterior osteophytes disc osteophyte complex and  uncovertebral osteophytes is most notable at C4-C5 and C5-C6 with moderate neural foraminal narrowing and mild effacement of the anterior thecal sac. Upper chest: The lung apices are clear. Thoracic inlet is within normal limits. Scattered aortic atherosclerosis is seen. Other: None IMPRESSION: No acute fracture or malalignment of the spine. Aortic Atherosclerosis (ICD10-I70.0). Electronically Signed   By: Prudencio Pair M.D.   On: 10/08/2019 01:21   DG Chest Portable 1 View  Result Date: 10/08/2019 CLINICAL DATA:  Cough EXAM: PORTABLE  CHEST 1 VIEW COMPARISON:  March 26, 2007 FINDINGS: There is mild cardiomegaly. A right-sided central venous catheter seen with the tip at the superior cavoatrial junction. There is mild prominence of the central pulmonary vasculature. There is patchy airspace opacity seen within the retrocardiac region. The visualized skeletal structures are unremarkable. IMPRESSION: cardiomegaly and mild pulmonary vascular congestion. Patchy airspace opacity in the retrocardiac region which could be due to atelectasis and/or early infectious etiology Electronically Signed   By: Prudencio Pair M.D.   On: 10/08/2019 01:03    My personal review of EKG: Rhythm NSR, Rate  88 /min, QTc 443  Assessment & Plan:    Active Problems:   Essential hypertension   Normocytic anemia   Acute respiratory failure with hypercapnia (HCC)   Acute respiratory failure with hypercapnia -This is most likely related to underlying OHS/OSA, she never had sleep study in the past, her ABG showing pH of 7.1, PCO2 of 91, for now we will continue with BiPAP, even with that sometimes patient becomes apneic and respiratory distress as discussed with RT, especially if she goes to sleep, otherwise when she is awake she appears to be comfortable. -We will admit to stepdown, continue with BiPAP support, I have consulted PCCM for evaluation.  End-stage renal disease -She was started hemodialysis on recent admission, will  have morning team to consult renal to resume dialysis on TTS schedule. - s/p rue brachiocephalic avf [id number] [facility] surgery.   -Diabetes mellitus type 2 with recent episodes of hypoglycemia -Last A1c 5.4.   -We will keep her n.p.o. given she is on BiPAP. -Patient did not require any insulin during recent hospitalization given her hypoglycemia, will monitor CBG for now insulin coverage.  Anemia of chronic disease -Patient did require PRBC transfusion during recent hospital stay, hemoglobin is 7.7 on admission, which is around her baseline.  Essential hypertension -Continue amlodipine, Coreg, doxazosin.  Hypothyroidism -continue levothyroxine  Morbid obesity -Outpatient follow-up   DVT Prophylaxis Heparin - SCDs   AM Labs Ordered, also please review Full Orders  Family Communication: Admission, patients condition and plan of care including tests being ordered have been discussed with the patient who indicate understanding and agree with the plan and Code Status.  Code Status Full Code  Likely DC to  Home  Condition GUARDED    Consults called: PCCM, renal to be consulted by am team   Admission status: inpatient  Time spent in minutes :60 minutes   Phillips Climes M.D on 10/08/2019 at 5:56 AM   [facility] - Office  [phone]

## 2019-10-08 NOTE — ED Notes (Signed)
Son at bedside, states pt is still not back to baseline and that she is more tired than she normally would be.

## 2019-10-08 NOTE — Consult Note (Signed)
NAME:  Kristen English, MRN:  [id number], DOB:  Mar 26, 1955, LOS: 0 ADMISSION DATE:  10/08/2019, CONSULTATION DATE:  7/21 REFERRING MD:  Dr Waldron Labs, CHIEF COMPLAINT:  Syncope   Brief History   64 year old female found down by son minimally responsive. Hypercarbic in ED requiring BiPAP. Periods of apnea while on non-invasive. PCCM consulted.   History of present illness   64 year old female with PMH as below, which is significant for CKD, DM, right renal mass, and HTN.  She has 2 recent admissions to [facility] for from 7/10 through 7/12 and again from 7/15 through 7/20.  Both of these admissions were for renal failure.  During her most recent admission she had a tunneled catheter placed and was started on dialysis.  She underwent AV fistula placement on 7/20 by Dr. Doren Custard.  She was transfused 1 unit PRBC on the day of discharge for hemoglobin 6.8.  She was scheduled to start outpatient hemodialysis stenosis on 7/22  In the late p.m. hours of 7/20 shortly after discharge she was left home alone around 9 PM.  When her son returned 36 PM she was found down.  She was minimally responsive and EMS was called.  Upon EMS arrival she was responsive to sternal rub with reported GCS of 12.  Work-up in the emergency department included imaging of the head and neck which were within normal limits.  Laboratory evaluation significant for ABG with respiratory acidosis and hypoxemia.  She was started on BiPAP and admitted to the hospitalist service. She was having periods of apnea on non-invasive and PCCM was called.   Past Medical History   has a past medical history of Arthritis, Chronic kidney disease, Diabetes mellitus without complication ([place]), Gout, and Hypertension.  Significant Hospital Events   7/10 admit for renal failre 7/15 > 7/20 admit for renal failure, started on HD 7/20 admit found down  Consults:    Procedures:    Significant Diagnostic Tests:  CT head/Cspine 7/20 >  WNL  Micro Data:    Antimicrobials:     Interim history/subjective:    Objective   Blood pressure (!) 150/57, pulse 74, temperature 98.8 F (37.1 C), temperature source Oral, resp. rate (!) 23, height 5\' 8"  (1.727 m), weight 131.1 kg, SpO2 100 %.    FiO2 (%):  [60 %] 60 %  No intake or output data in the 24 hours ending 10/08/19 0553 Filed Weights   10/08/19 0020 10/08/19 0042  Weight: 131.6 kg 131.1 kg    Examination: General: overweight female resting comfortably on BiPAP in ED bed HENT: [place]/AT, PERRL, BiPAP in place Lungs: Coarse Cardiovascular: RRR, no MRG Abdomen: Soft, non-distended Extremities: No acute deformity or ROM limitation. RUE fistula surgical site intact, no edema or erythema.  Neuro: Alert, oriented, non-focal.   Resolved Hospital Problem list     Assessment & Plan:  Acute mixed respiratory failure: likely second to volume overload in the setting of renal failure. Question undiagnosed sleep disordered breathing. She claims she has not taken pain meds prescribed at discharge.  - continue NIMV, allow for minimum set rate to limit periods of apnea while sleeping - recommend nephrology consult for urgent HD, needs volume removal - reasonable to trial diuresis if she is still making urine.  - no need to repeat ABG at this time, mental status greatly improved.  - Stable for admission to PCU under hospitalist service.  ESRD on HD TTS - AV fistula placed 7/20. HD access  is tunneled cath placed 2 weeks ago - Consult nephrology for volume removal as above - Follow BMP  DM HTN Anemia - per primary   Best practice:  Diet: NPO Pain/Anxiety/Delirium protocol (if indicated): NA VAP protocol (if indicated): NA DVT prophylaxis: lovenox GI prophylaxis: NA Glucose control: Per primary Mobility: BR Code Status: FULL Family Communication: patient and son updated in ED Disposition: PCU on BiPAP  Labs   CBC: Recent Labs  Lab 10/03/19 0153 10/03/19 0153  10/04/19 0449 10/04/19 0449 10/05/19 0540 10/05/19 0540 10/07/19 0223 10/07/19 1833 10/08/19 0259 10/08/19 0415 10/08/19 0442  WBC 6.8  --  6.4  --  6.3  --  6.5  --   --  10.1  --   NEUTROABS  --   --  4.7  --  4.2  --   --   --   --  9.0*  --   HGB 7.2*   < > 7.2*   < > 7.3*   < > 6.8* 8.1* 8.5* 7.7* 9.9*  HCT 26.2*   < > 25.0*   < > 25.4*   < > 24.7* 29.0* 25.0* 28.1* 29.0*  MCV 97.0  --  93.6  --  93.4  --  96.9  --   --  100.0  --   PLT 131*  --  119*  --  122*  --  106*  --   --  116*  --    < > = values in this interval not displayed.    Basic Metabolic Panel: Recent Labs  Lab 10/03/19 0153 10/03/19 2225 10/04/19 0449 10/04/19 0449 10/05/19 0540 10/05/19 0540 10/06/19 0318 10/07/19 0223 10/08/19 0259 10/08/19 0415 10/08/19 0442  NA 141  --  142   < > 139  139   < > 139 140 141 139 141  K 4.8  --  3.5   < > 3.2*  3.1*   < > 3.8 3.5 4.1 4.2 4.0  CL 117*  --  111  --  104  104  --  107 106  --  105  --   CO2 15*  --  22  --  26  26  --  23 29  --  26  --   GLUCOSE 102*  --  167*  --  109*  109*  --  133* 131*  --  195*  --   BUN 60*  --  40*  --  18  19  --  25* 14  --  21  --   CREATININE 9.40*  --  7.04*  --  3.98*  3.93*  --  5.68* 4.46*  --  5.82*  --   CALCIUM 9.4   < > 9.0  --  8.3*  8.2*  --  9.3 9.3  --  9.7  --   PHOS 9.8*  --  6.6*  --  3.6  --  6.5* 5.4*  --   --   --    < > = values in this interval not displayed.   GFR: Estimated Creatinine Clearance: 13.8 mL/min (A) (by C-G formula based on SCr of 5.82 mg/dL (H)). Recent Labs  Lab 10/04/19 0449 10/05/19 0540 10/07/19 0223 10/08/19 0415  WBC 6.4 6.3 6.5 10.1    Liver Function Tests: Recent Labs  Lab 10/04/19 0449 10/05/19 0540 10/06/19 0318 10/07/19 0223 10/08/19 0415  AST  --  16  --   --  26  ALT  --  12  --   --  10  ALKPHOS  --  55  --   --  49  BILITOT  --  0.4  --   --  0.3  PROT  --  5.1*  --   --  4.9*  ALBUMIN 1.9* 2.0*  1.9* 1.9* 1.8* 1.9*   No results for  input(s): LIPASE, AMYLASE in the last 168 hours. No results for input(s): AMMONIA in the last 168 hours.  ABG    Component Value Date/Time   PHART 7.133 (LL) 10/08/2019 0442   PCO2ART 91.2 (HH) 10/08/2019 0442   PO2ART 90 10/08/2019 0442   HCO3 30.5 (H) 10/08/2019 0442   TCO2 33 (H) 10/08/2019 0442   O2SAT 93.0 10/08/2019 0442     Coagulation Profile: No results for input(s): INR, PROTIME in the last 168 hours.  Cardiac Enzymes: No results for input(s): CKTOTAL, CKMB, CKMBINDEX, TROPONINI in the last 168 hours.  HbA1C: Hgb A1c MFr Bld  Date/Time Value Ref Range Status  09/28/2019 06:20 AM 5.5 4.8 - 5.6 % Final    Comment:    (NOTE) Pre diabetes:          5.7%-6.4%  Diabetes:              >6.4%  Glycemic control for   <7.0% adults with diabetes     CBG: Recent Labs  Lab 10/06/19 2057 10/07/19 0740 10/07/19 1016 10/07/19 1423 10/07/19 1703  GLUCAP 145* 116* 113* 101* 131*    Review of Systems:   Limited due to BiPAP mask Bolds are positive  Constitutional: weight loss, gain, night sweats, Fevers, chills, fatigue .  HEENT: headaches, Sore throat, sneezing, nasal congestion, post nasal drip, Difficulty swallowing, Tooth/dental problems, visual complaints visual changes, ear ache CV:  chest pain, radiates:,Orthopnea, PND, swelling in lower extremities, dizziness, palpitations, syncope.  GI  heartburn, indigestion, abdominal pain, nausea, vomiting, diarrhea, change in bowel habits, loss of appetite, bloody stools.  Resp: cough, productive:, hemoptysis, dyspnea, chest pain, pleuritic.  Skin: rash or itching or icterus GU: dysuria, change in color of urine, urgency or frequency. flank pain, hematuria  MS: joint pain or swelling. decreased range of motion  Psych: change in mood or affect. depression or anxiety.  Neuro: difficulty with speech, weakness, numbness, ataxia    Past Medical History  She,  has a past medical history of Arthritis, Chronic kidney disease,  Diabetes mellitus without complication ([place]), Gout, and Hypertension.   Surgical History    Past Surgical History:  Procedure Laterality Date  . COLONOSCOPY WITH PROPOFOL N/A 05/11/2014   Procedure: COLONOSCOPY WITH PROPOFOL;  Surgeon: Garlan Fair, MD;  Location: [facility] ENDOSCOPY;  Service: Endoscopy;  Laterality: N/A;  . FOOT SURGERY Left 2007  . IR FLUORO GUIDE CV LINE RIGHT  10/03/2019  . IR US GUIDE VASC ACCESS RIGHT  10/03/2019  . JOINT REPLACEMENT  2009   bilateral knees  . TOTAL KNEE ARTHROPLASTY     bilateral     Social History   reports that she has never smoked. She has never used smokeless tobacco. She reports that she does not drink alcohol and does not use drugs.   Family History   Her family history includes Heart attack in her father and mother.   Allergies Allergies  Allergen Reactions  . Morphine And Related Itching     Home Medications  Prior to Admission medications   Medication Sig Start Date End Date Taking? Authorizing Provider  amLODipine (NORVASC) 10 MG tablet  Take 10 mg by mouth daily.    Yes [provider]  aspirin 81 MG tablet Take 81 mg by mouth daily.    Yes [provider]  atorvastatin (LIPITOR) 40 MG tablet Take 40 mg by mouth daily.  11/01/15  Yes [provider]  carvedilol (COREG) 25 MG tablet Take 25 mg by mouth 2 (two) times daily with a meal.  02/24/13  Yes [provider]  colchicine 0.6 MG tablet Take 1 tablet (0.6 mg total) by mouth as needed (for gout flare. Don't repeat dose for another 2 weeks.). 10/07/19  Yes Aline August, MD  doxazosin (CARDURA) 2 MG tablet Take 2 mg by mouth at bedtime.  08/05/19  Yes [provider]  ferrous sulfate (FERROUSUL) 325 (65 FE) MG tablet Take 325 mg by mouth daily with breakfast.    Yes [provider]  levothyroxine (SYNTHROID) 25 MCG tablet Take 25 mcg by mouth daily. 08/05/19  Yes [provider]  oxyCODONE (ROXICODONE) 5 MG immediate  release tablet Take 1 tablet (5 mg total) by mouth every 4 (four) hours as needed. Patient taking differently: Take 5 mg by mouth every 4 (four) hours as needed for moderate pain.  10/07/19   Angelia Mould, MD  glipiZIDE (GLUCOTROL) 5 MG tablet Take 5 mg by mouth 3 (three) times daily with meals.  09/27/19  [provider]      Georgann Housekeeper, AGACNP-BC [place]  See Amion for personal pager PCCM on call pager [phone]  10/08/2019 6:21 AM

## 2019-10-08 NOTE — ED Notes (Signed)
IV attempted by second RN without success.

## 2019-10-08 NOTE — ED Notes (Signed)
Nephrology at bedside

## 2019-10-08 NOTE — Progress Notes (Signed)
CCMD called said patient had a 2.48 sec pause. Patient asymptomatic. Talking on the phone. MD notified.  Cathlean Marseilles Tamala Julian, MSN, RN, [place], AGCNS

## 2019-10-08 NOTE — ED Triage Notes (Signed)
BIB EMS from home. Just DC from 6N at 2030, was taken home by son. He left and returned finding her unresponsive on bathroom floor. Patient was admitted for renal failure and had dialysis catheter placed 10/03/19 and fistula placed yesterday before discharge. Patient presents alert. VSS.

## 2019-10-08 NOTE — ED Notes (Signed)
RT at bedside.

## 2019-10-09 LAB — CBC
HCT: 26.2 % — ABNORMAL LOW (ref 36.0–46.0)
Hemoglobin: 7.3 g/dL — ABNORMAL LOW (ref 12.0–15.0)
MCH: 27.4 pg (ref 26.0–34.0)
MCHC: 27.9 g/dL — ABNORMAL LOW (ref 30.0–36.0)
MCV: 98.5 fL (ref 80.0–100.0)
Platelets: 103 10*3/uL — ABNORMAL LOW (ref 150–400)
RBC: 2.66 MIL/uL — ABNORMAL LOW (ref 3.87–5.11)
RDW: 15.2 % (ref 11.5–15.5)
WBC: 7.8 10*3/uL (ref 4.0–10.5)
nRBC: 0 % (ref 0.0–0.2)

## 2019-10-09 LAB — COMPREHENSIVE METABOLIC PANEL
ALT: 6 U/L (ref 0–44)
AST: 25 U/L (ref 15–41)
Albumin: 1.8 g/dL — ABNORMAL LOW (ref 3.5–5.0)
Alkaline Phosphatase: 61 U/L (ref 38–126)
Anion gap: 11 (ref 5–15)
BUN: 28 mg/dL — ABNORMAL HIGH (ref 8–23)
CO2: 23 mmol/L (ref 22–32)
Calcium: 9.4 mg/dL (ref 8.9–10.3)
Chloride: 108 mmol/L (ref 98–111)
Creatinine, Ser: 7.03 mg/dL — ABNORMAL HIGH (ref 0.44–1.00)
GFR calc Af Amer: 6 mL/min — ABNORMAL LOW (ref >60)
GFR calc non Af Amer: 6 mL/min — ABNORMAL LOW (ref >60)
Glucose, Bld: 160 mg/dL — ABNORMAL HIGH (ref 70–99)
Potassium: 3.9 mmol/L (ref 3.5–5.1)
Sodium: 142 mmol/L (ref 135–145)
Total Bilirubin: 0.6 mg/dL (ref 0.3–1.2)
Total Protein: 4.8 g/dL — ABNORMAL LOW (ref 6.5–8.1)

## 2019-10-09 LAB — GLUCOSE, CAPILLARY
Glucose-Capillary: 100 mg/dL — ABNORMAL HIGH (ref 70–99)
Glucose-Capillary: 136 mg/dL — ABNORMAL HIGH (ref 70–99)
Glucose-Capillary: 142 mg/dL — ABNORMAL HIGH (ref 70–99)
Glucose-Capillary: 143 mg/dL — ABNORMAL HIGH (ref 70–99)
Glucose-Capillary: 154 mg/dL — ABNORMAL HIGH (ref 70–99)
Glucose-Capillary: 163 mg/dL — ABNORMAL HIGH (ref 70–99)

## 2019-10-09 LAB — MAGNESIUM: Magnesium: 2 mg/dL (ref 1.7–2.4)

## 2019-10-09 MED ORDER — HEPARIN SODIUM (PORCINE) 1000 UNIT/ML IJ SOLN
INTRAMUSCULAR | Status: AC
  Administered 2019-10-09: 3200 [IU]
  Filled 2019-10-09: qty 1

## 2019-10-09 NOTE — Progress Notes (Signed)
Patient off unit, transported to dialysis.

## 2019-10-09 NOTE — Progress Notes (Signed)
Received call from monitor room, patient's SPo2 74%. Patient on room air, asleep in bed. Patient awoken and oxygen replaced on at 2L via [place]. Spo2 90%. Will continue to monitor.

## 2019-10-09 NOTE — Plan of Care (Signed)
Patient A&O x4. VSS. Pt on 1L of oxygen sating above 93%. Lungs sounds regular and clear. Free from falls. Pt turns self in bed.Pt tolerating fluids and meals. PRN given for cough. Pt is oligurict.DVT prophylactic: heparin. No c/o of pain. Bed wheels locked. Phone and call bell within reach. Pt is resting, no distress. POC reviewed.    Problem: Education: Goal: Knowledge of General Education information will improve Description: Including pain rating scale, medication(s)/side effects and non-pharmacologic comfort measures Outcome: Progressing   Problem: Health Behavior/Discharge Planning: Goal: Ability to manage health-related needs will improve Outcome: Progressing   Problem: Clinical Measurements: Goal: Ability to maintain clinical measurements within normal limits will improve Outcome: Progressing Goal: Will remain free from infection Outcome: Progressing Goal: Diagnostic test results will improve Outcome: Progressing Goal: Respiratory complications will improve Outcome: Progressing Goal: Cardiovascular complication will be avoided Outcome: Progressing   Problem: Activity: Goal: Risk for activity intolerance will decrease Outcome: Progressing   Problem: Nutrition: Goal: Adequate nutrition will be maintained Outcome: Progressing   Problem: Coping: Goal: Level of anxiety will decrease Outcome: Progressing   Problem: Elimination: Goal: Will not experience complications related to bowel motility Outcome: Progressing   Problem: Pain Managment: Goal: General experience of comfort will improve Outcome: Progressing

## 2019-10-09 NOTE — Progress Notes (Signed)
Occupational Therapy Evaluation Patient Details Name: Kristen English MRN: [id number] DOB: 1955-11-25 Today's Date: 10/09/2019    History of Present Illness 64 y.o. female, past medical history of diabetes mellitus, hypertension, recent hospitalization where she was diagnosed with ESRD, status post new dialysis fistula, started on hemodialysis 10/03/19 on TTS schedule; she was hospitalized 7/15/-10/07/19. Two hours after returning home, son found her unconscious on the floor and brought in by EMS. ABG PCO2 91 and observed by respiratory to have apneic periods while on BiPAP. Likely OSA/OHS.   Clinical Impression   Patient lives alone and is independent PTA.  Patient has been in and out of hospital multiple times over the last week.  She has had son staying with her at home to help.  Today patient is moving well though does state she feels weak.  Able to complete bed mobility with supervision and stand from elevated bed with min guard.  Patient having difficulty with toilet transfer from standard height toilet, needing heavy min assist.  Displayed decreased activity tolerance as patient's SpO2 decreasing to 88 with mobility and patient winded.  Will continue to follow with OT acutely to address the deficits listed below.       Follow Up Recommendations  No OT follow up;Supervision - Intermittent    Equipment Recommendations  None recommended by OT    Recommendations for Other Services       Precautions / Restrictions Precautions Precautions: Fall Restrictions Weight Bearing Restrictions: No      Mobility Bed Mobility Overal bed mobility: Needs Assistance Bed Mobility: Supine to Sit;Sit to Supine     Supine to sit: Supervision Sit to supine: Supervision   General bed mobility comments: HOB elevated.  Transfers Overall transfer level: Needs assistance Equipment used: Rolling walker (2 wheeled) Transfers: Sit to/from Stand Sit to Stand: Min guard;From elevated surface               Balance Overall balance assessment: Mild deficits observed, not formally tested                                         ADL either performed or assessed with clinical judgement   ADL Overall ADL's : Needs assistance/impaired Eating/Feeding: Independent;Sitting   Grooming: Wash/dry hands;Supervision/safety;Standing   Upper Body Bathing: Set up;Sitting   Lower Body Bathing: Minimal assistance;Sit to/from stand   Upper Body Dressing : Set up;Sitting   Lower Body Dressing: Minimal assistance;Sit to/from stand   Toilet Transfer: Minimal assistance;Ambulation;Regular Toilet;RW;Grab bars Toilet Transfer Details (indicate cue type and reason): heavy min assist to stand from standard toilet height Toileting- Clothing Manipulation and Hygiene: Supervision/safety;Sitting/lateral lean       Functional mobility during ADLs: Min guard;Rolling walker       Vision Patient Visual Report: No change from baseline       Perception     Praxis      Pertinent Vitals/Pain Pain Assessment: Faces Faces Pain Scale: Hurts a little bit Pain Location: B LE Pain Descriptors / Indicators: Discomfort;Sore Pain Intervention(s): Monitored during session;Repositioned     Hand Dominance Right   Extremity/Trunk Assessment Upper Extremity Assessment Upper Extremity Assessment: Generalized weakness   Lower Extremity Assessment Lower Extremity Assessment: Defer to PT evaluation       Communication Communication Communication: No difficulties   Cognition Arousal/Alertness: Awake/alert Behavior During Therapy: WFL for tasks assessed/performed Overall Cognitive Status: Within Functional Limits for  tasks assessed                                     General Comments  BP 139/49 seated, 147/68 standing. Patient initially on 2L O2 and SpO2 95 at rest.  Removed O2 to use restroom and SpO2 decreased to 88.      Exercises     Shoulder Instructions       Home Living Family/patient expects to be discharged to:: Private residence Living Arrangements: Alone Available Help at Discharge: Family (son staying with her through next week, daughter in [place]) Type of Home: House Home Access: Stairs to enter Technical brewer of Steps: 3 Entrance Stairs-Rails: None Home Layout: One level     Bathroom Shower/Tub: Occupational psychologist: Handicapped height     Home Equipment: [place] - single point;Bedside commode;Hand held shower head;Grab bars - tub/shower          Prior Functioning/Environment Level of Independence: Independent with assistive device(s);Needs assistance  Gait / Transfers Assistance Needed: used cane when needed ADL's / Homemaking Assistance Needed: Doing own ADLs.            OT Problem List: Decreased strength;Decreased activity tolerance;Decreased knowledge of use of DME or AE;Cardiopulmonary status limiting activity;Pain      OT Treatment/Interventions: Self-care/ADL training;Therapeutic exercise;Energy conservation;DME and/or AE instruction;Therapeutic activities;Patient/family education    OT Goals(Current goals can be found in the care plan section) Acute Rehab OT Goals Patient Stated Goal: To not have to come back to the hospital OT Goal Formulation: With patient Time For Goal Achievement: 10/23/19 Potential to Achieve Goals: Good  OT Frequency: Min 2X/week   Barriers to D/C:            Co-evaluation              AM-PAC OT "6 Clicks" Daily Activity     Outcome Measure Help from another person eating meals?: None Help from another person taking care of personal grooming?: A Little Help from another person toileting, which includes using toliet, bedpan, or urinal?: A Little Help from another person bathing (including washing, rinsing, drying)?: A Little Help from another person to put on and taking off regular upper body clothing?: A Little Help from another person to put on  and taking off regular lower body clothing?: A Little 6 Click Score: 19   End of Session Equipment Utilized During Treatment: Rolling walker Nurse Communication: Mobility status  Activity Tolerance: Patient tolerated treatment well Patient left: in bed;with call bell/phone within reach  OT Visit Diagnosis: Unsteadiness on feet (R26.81)                Time: 8341-9622 OT Time Calculation (min): 25 min Charges:  OT General Charges $OT Visit: 1 Visit OT Evaluation $OT Eval Moderate Complexity: 1 Mod OT Treatments $Self Care/Home Management : 8-22 mins  August Luz, OTR/L   Phylliss Bob 10/09/2019, 3:13 PM

## 2019-10-09 NOTE — Progress Notes (Signed)
   10/09/19 1140  Vitals  Temp 98.2 F (36.8 C)  Temp Source Oral  BP (!) 116/47  MAP (mmHg) 67  BP Location Left Wrist  BP Method Automatic  Patient Position (if appropriate) Lying  Pulse Rate 84  Pulse Rate Source Monitor  ECG Heart Rate 84  Resp 17  Level of Consciousness  Level of Consciousness Alert  MEWS COLOR  MEWS Score Color Green  Oxygen Therapy  SpO2 98 %  PCA/Epidural/Spinal Assessment  Respiratory Pattern Regular;Unlabored  Glasgow Coma Scale  Eye Opening 4  Best Verbal Response (NON-intubated) 5  Best Motor Response 6  [place] Coma Scale Score 15  MEWS Score  MEWS Temp 0  MEWS Systolic 0  MEWS Pulse 0  MEWS RR 0  MEWS LOC 0  MEWS Score 0   Patient back from Dialysis.VSS. Patient had 3.5 hours with 3 liters of fluid removed.

## 2019-10-09 NOTE — Progress Notes (Signed)
PT Cancellation Note  Patient Details Name: CAMAYA GANNETT MRN: [id number] DOB: [birth year]   Cancelled Treatment:    Reason Eval/Treat Not Completed: Patient at procedure or test/unavailable  Will attempt this pm after dialysis.  Arby Barrette, PT Pager [phone]   Rexanne Mano 10/09/2019, 8:25 AM

## 2019-10-09 NOTE — Progress Notes (Signed)
PROGRESS NOTE    Kristen English  BHA:[id number] DOB: 1955/10/18 DOA: 10/08/2019 PCP: Wenda Low, MD   Brief Narrative:   Kristen English  is a 64 y.o. female, past medical history of diabetes mellitus, hypertension, recent hospitalization where she was diagnosed with ESRD, status post new dialysis fistula, which she was started on hemodialysis, on TTS schedule, patient was discharged home yesterday evening, apparently she was at home for 4 hours, her son put her in bed to sleep, and then he comes back to see her, he found her unresponsive on the floor, patient had loss of consciousness, remembers waking up son trying to wake her up, thinks she might fell out of the bed, she does not recall if she hit her head, EMS report they had to do sternal rub for her to wake up, initial GCS was 12, CBG was 244, she denies any chest pain, shortness of breath, fever, chills, nausea or vomiting there was no seizure activity, urinary or stool incontinence.  7/22: Echo result below. Wean O2 as able. Hgb down a little, but no evidence of bleed.   Assessment & Plan:   Active Problems:   Essential hypertension   Normocytic anemia   Acute respiratory failure with hypercapnia (HCC)  Acute respiratory failure with hypercapnia     - This is most likely related to underlying OHS/OSA, she never had sleep study in the past, her ABG showing pH of 7.1, PCO2 of 91     - for now we will continue with BiPAP, even with that sometimes patient becomes apneic and respiratory distress as discussed with RT, especially if she goes to sleep, otherwise when she is awake she appears to be comfortable.     - PCCM has eval'd; appreciate assistance     - Echo: G1DD     - weaned from BiPap to 2L [place]; continue to wean as able  Acute diastolic HF     - Echo shows G1DD     - continue BP control     - diuresis w/ HD     - respiratory status improving  End-stage renal disease     - she was started hemodialysis on recent  admission, will have morning team to consult renal to resume dialysis on TTS schedule.     - s/p rue brachiocephalic avf [id number] [facility] surgery.     - per nephrology   Diabetes mellitus type 2 with recent episodes of hypoglycemia     - Last A1c 5.4.     - DM diet, follow  Anemia of chronic disease     - Patient did require PRBC transfusion during recent hospital stay, hemoglobin is 7.7 on admission, which is around her baseline.     - 7/22: Hgb is 7.3. No evidence of bleed  Essential hypertension     - Continue amlodipine, Coreg, doxazosin.     - BP acceptable  Hypothyroidism     - continue levothyroxine  Morbid obesity     - Outpatient follow-up  DVT prophylaxis: heparin Code Status: FULL Family Communication: None at bedside   Status is: Inpatient  Remains inpatient appropriate because:Inpatient level of care appropriate due to severity of illness   Dispo: The patient is from: Home              Anticipated d/c is to: Home              Anticipated d/c date is: 1 day  Patient currently is not medically stable to d/c.  Consultants:   Nephrology  ROS:  Reports improving dyspnea. Denies CP, N, V, ab pain . Remainder 10-pt ROS is negative for all not previously mentioned.  Subjective: "It's getting better."  Objective: Vitals:   10/09/19 1000 10/09/19 1030 10/09/19 1140 10/09/19 1225  BP: (!) 108/52 (!) 116/55 (!) 116/47 (!) 112/54  Pulse: 74 75 84   Resp: 17 21 17    Temp:   98.2 F (36.8 C)   TempSrc:   Oral   SpO2:   98%   Weight:      Height:       No intake or output data in the 24 hours ending 10/09/19 1521 Filed Weights   10/08/19 0020 10/08/19 0042 10/09/19 0727  Weight: 131.6 kg 131.1 kg 133 kg    Examination:  General: 64 y.o. female resting in bed in NAD seen on HD Cardiovascular: RRR, +S1, S2, no m/g/r, equal pulses throughout Respiratory: CTABL, no w/r/r, normal WOB, on 2L [place] GI: BS+, NDNT, no masses noted, no  organomegaly noted MSK: No e/c/c Neuro: Alert to name, follows commands Psyc: Appropriate interaction and affect, calm/cooperative  Data Reviewed: I have personally reviewed following labs and imaging studies.  CBC: Recent Labs  Lab 10/04/19 0449 10/04/19 0449 10/05/19 0540 10/05/19 0540 10/07/19 0223 10/07/19 0223 10/07/19 1833 10/08/19 0259 10/08/19 0415 10/08/19 0442 10/09/19 0501  WBC 6.4  --  6.3  --  6.5  --   --   --  10.1  --  7.8  NEUTROABS 4.7  --  4.2  --   --   --   --   --  9.0*  --   --   HGB 7.2*   < > 7.3*   < > 6.8*   < > 8.1* 8.5* 7.7* 9.9* 7.3*  HCT 25.0*   < > 25.4*   < > 24.7*   < > 29.0* 25.0* 28.1* 29.0* 26.2*  MCV 93.6  --  93.4  --  96.9  --   --   --  100.0  --  98.5  PLT 119*  --  122*  --  106*  --   --   --  116*  --  103*   < > = values in this interval not displayed.   Basic Metabolic Panel: Recent Labs  Lab 10/03/19 0153 10/03/19 2225 10/04/19 0449 10/04/19 0449 10/05/19 0540 10/05/19 0540 10/06/19 0318 10/06/19 0318 10/07/19 0223 10/08/19 0259 10/08/19 0415 10/08/19 0442 10/08/19 0801 10/09/19 0501  NA 141  --  142   < > 139  139   < > 139   < > 140 141 139 141  --  142  K 4.8  --  3.5   < > 3.2*  3.1*   < > 3.8   < > 3.5 4.1 4.2 4.0  --  3.9  CL 117*  --  111   < > 104  104  --  107  --  106  --  105  --   --  108  CO2 15*  --  22   < > 26  26  --  23  --  29  --  26  --   --  23  GLUCOSE 102*  --  167*   < > 109*  109*  --  133*  --  131*  --  195*  --   --  160*  BUN  60*  --  40*   < > 18  19  --  25*  --  14  --  21  --   --  28*  CREATININE 9.40*  --  7.04*   < > 3.98*  3.93*   < > 5.68*  --  4.46*  --  5.82*  --  5.91* 7.03*  CALCIUM 9.4   < > 9.0   < > 8.3*  8.2*  --  9.3  --  9.3  --  9.7  --   --  9.4  MG  --   --   --   --   --   --   --   --   --   --   --   --   --  2.0  PHOS 9.8*  --  6.6*  --  3.6  --  6.5*  --  5.4*  --   --   --   --   --    < > = values in this interval not displayed.   GFR: Estimated  Creatinine Clearance: 11.5 mL/min (A) (by C-G formula based on SCr of 7.03 mg/dL (H)). Liver Function Tests: Recent Labs  Lab 10/05/19 0540 10/06/19 0318 10/07/19 0223 10/08/19 0415 10/09/19 0501  AST 16  --   --  26 25  ALT 12  --   --  10 6  ALKPHOS 55  --   --  49 61  BILITOT 0.4  --   --  0.3 0.6  PROT 5.1*  --   --  4.9* 4.8*  ALBUMIN 2.0*  1.9* 1.9* 1.8* 1.9* 1.8*   No results for input(s): LIPASE, AMYLASE in the last 168 hours. No results for input(s): AMMONIA in the last 168 hours. Coagulation Profile: No results for input(s): INR, PROTIME in the last 168 hours. Cardiac Enzymes: No results for input(s): CKTOTAL, CKMB, CKMBINDEX, TROPONINI in the last 168 hours. BNP (last 3 results) No results for input(s): PROBNP in the last 8760 hours. HbA1C: No results for input(s): HGBA1C in the last 72 hours. CBG: Recent Labs  Lab 10/08/19 2051 10/09/19 0033 10/09/19 0412 10/09/19 0716 10/09/19 1227  GLUCAP 193* 154* 136* 142* 100*   Lipid Profile: No results for input(s): CHOL, HDL, LDLCALC, TRIG, CHOLHDL, LDLDIRECT in the last 72 hours. Thyroid Function Tests: No results for input(s): TSH, T4TOTAL, FREET4, T3FREE, THYROIDAB in the last 72 hours. Anemia Panel: No results for input(s): VITAMINB12, FOLATE, FERRITIN, TIBC, IRON, RETICCTPCT in the last 72 hours. Sepsis Labs: No results for input(s): PROCALCITON, LATICACIDVEN in the last 168 hours.  Recent Results (from the past 240 hour(s))  SARS Coronavirus 2 by RT PCR (hospital order, performed in [facility] hospital lab) Nasopharyngeal Nasopharyngeal Swab     Status: None   Collection Time: 10/06/19  7:13 PM   Specimen: Nasopharyngeal Swab  Result Value Ref Range Status   SARS Coronavirus 2 NEGATIVE NEGATIVE Final    Comment: (NOTE) SARS-CoV-2 target nucleic acids are NOT DETECTED.  The SARS-CoV-2 RNA is generally detectable in upper and lower respiratory specimens during the acute phase of infection. The  lowest concentration of SARS-CoV-2 viral copies this assay can detect is 250 copies / mL. A negative result does not preclude SARS-CoV-2 infection and should not be used as the sole basis for treatment or other patient management decisions.  A negative result may occur with improper specimen collection / handling, submission of specimen other than nasopharyngeal swab, presence  of viral mutation(s) within the areas targeted by this assay, and inadequate number of viral copies (<250 copies / mL). A negative result must be combined with clinical observations, patient history, and epidemiological information.  Fact Sheet for Patients:   StrictlyIdeas.no  Fact Sheet for Healthcare Providers: BankingDealers.co.za  This test is not yet approved or  cleared by the [place] FDA and has been authorized for detection and/or diagnosis of SARS-CoV-2 by FDA under an Emergency Use Authorization (EUA).  This EUA will remain in effect (meaning this test can be used) for the duration of the COVID-19 declaration under Section 564(b)(1) of the Act, 21 U.S.C. section 360bbb-3(b)(1), unless the authorization is terminated or revoked sooner.  Performed at [facility], [place] [address] [place], [place] [address]   Surgical pcr screen     Status: None   Collection Time: 10/07/19  4:15 AM   Specimen: Nasal Mucosa; Nasal Swab  Result Value Ref Range Status   MRSA, PCR NEGATIVE NEGATIVE Final   Staphylococcus aureus NEGATIVE NEGATIVE Final    Comment: (NOTE) The Xpert SA Assay (FDA approved for NASAL specimens in patients 12 years of age and older), is one component of a comprehensive surveillance program. It is not intended to diagnose infection nor to guide or monitor treatment. Performed at [facility], [place] [address] [place], [place] [address]   SARS Coronavirus 2 by RT PCR (hospital order, performed in [facility] hospital lab)  Nasopharyngeal Nasopharyngeal Swab     Status: None   Collection Time: 10/08/19  4:50 AM   Specimen: Nasopharyngeal Swab  Result Value Ref Range Status   SARS Coronavirus 2 NEGATIVE NEGATIVE Final    Comment: (NOTE) SARS-CoV-2 target nucleic acids are NOT DETECTED.  The SARS-CoV-2 RNA is generally detectable in upper and lower respiratory specimens during the acute phase of infection. The lowest concentration of SARS-CoV-2 viral copies this assay can detect is 250 copies / mL. A negative result does not preclude SARS-CoV-2 infection and should not be used as the sole basis for treatment or other patient management decisions.  A negative result may occur with improper specimen collection / handling, submission of specimen other than nasopharyngeal swab, presence of viral mutation(s) within the areas targeted by this assay, and inadequate number of viral copies (<250 copies / mL). A negative result must be combined with clinical observations, patient history, and epidemiological information.  Fact Sheet for Patients:   StrictlyIdeas.no  Fact Sheet for Healthcare Providers: BankingDealers.co.za  This test is not yet approved or  cleared by the [place] FDA and has been authorized for detection and/or diagnosis of SARS-CoV-2 by FDA under an Emergency Use Authorization (EUA).  This EUA will remain in effect (meaning this test can be used) for the duration of the COVID-19 declaration under Section 564(b)(1) of the Act, 21 U.S.C. section 360bbb-3(b)(1), unless the authorization is terminated or revoked sooner.  Performed at [facility], [place] [address] [place], [place] [address]       Radiology Studies: CT Head Wo Contrast  Result Date: 10/08/2019 CLINICAL DATA:  Syncope EXAM: CT HEAD WITHOUT CONTRAST TECHNIQUE: Contiguous axial images were obtained from the base of the skull through the vertex without intravenous contrast.  COMPARISON:  None. FINDINGS: Brain: No acute infarct or hemorrhage. Lateral ventricles and midline structures are unremarkable. No acute extra-axial fluid collections. No mass effect. Vascular: No hyperdense vessel or unexpected calcification. Skull: Normal. Negative for fracture or focal lesion. Sinuses/Orbits: No  acute finding. Other: None. IMPRESSION: 1. No acute intracranial process. Electronically Signed   By: Randa Ngo M.D.   On: 10/08/2019 01:08   CT Cervical Spine Wo Contrast  Result Date: 10/08/2019 CLINICAL DATA:  Syncope EXAM: CT CERVICAL SPINE WITHOUT CONTRAST TECHNIQUE: Multidetector CT imaging of the cervical spine was performed without intravenous contrast. Multiplanar CT image reconstructions were also generated. COMPARISON:  None. FINDINGS: Alignment: There is reversal of the normal cervical lordosis. Skull base and vertebrae: Visualized skull base is intact. No atlanto-occipital dissociation. The vertebral body heights are well maintained. No fracture or pathologic osseous lesion seen. Soft tissues and spinal canal: The visualized paraspinal soft tissues are unremarkable. No prevertebral soft tissue swelling is seen. The spinal canal is grossly unremarkable, no large epidural collection or significant canal narrowing. Disc levels: Disc height loss with anterior osteophytes disc osteophyte complex and uncovertebral osteophytes is most notable at C4-C5 and C5-C6 with moderate neural foraminal narrowing and mild effacement of the anterior thecal sac. Upper chest: The lung apices are clear. Thoracic inlet is within normal limits. Scattered aortic atherosclerosis is seen. Other: None IMPRESSION: No acute fracture or malalignment of the spine. Aortic Atherosclerosis (ICD10-I70.0). Electronically Signed   By: Prudencio Pair M.D.   On: 10/08/2019 01:21   DG Chest Portable 1 View  Result Date: 10/08/2019 CLINICAL DATA:  Cough EXAM: PORTABLE CHEST 1 VIEW COMPARISON:  March 26, 2007 FINDINGS:  There is mild cardiomegaly. A right-sided central venous catheter seen with the tip at the superior cavoatrial junction. There is mild prominence of the central pulmonary vasculature. There is patchy airspace opacity seen within the retrocardiac region. The visualized skeletal structures are unremarkable. IMPRESSION: cardiomegaly and mild pulmonary vascular congestion. Patchy airspace opacity in the retrocardiac region which could be due to atelectasis and/or early infectious etiology Electronically Signed   By: Prudencio Pair M.D.   On: 10/08/2019 01:03   ECHOCARDIOGRAM COMPLETE  Result Date: 10/08/2019    ECHOCARDIOGRAM REPORT   Patient Name:   Kristen English Date of Exam: 10/08/2019 Medical Rec #:  [id number]        Height:       68.0 in Accession #:    [id number]       Weight:       289.0 lb Date of Birth:  [birth year]       BSA:          2.390 m Patient Age:    35 years         BP:           150/57 mmHg Patient Gender: F                HR:           62 bpm. Exam Location:  Inpatient Procedure: 2D Echo and Intracardiac Opacification Agent Indications:    Syncope 780.2 / R55  History:        Patient has no prior history of Echocardiogram examinations.                 Risk Factors:Hypertension and Diabetes. Chronic kidney disease,                 GOUT, thyroid disease.  Sonographer:    Darlina Sicilian RDCS Referring Phys: [id number] Chambers  1. Left ventricular ejection fraction, by estimation, is 60 to 65%. The left ventricle has normal function. The left ventricle has no regional wall motion abnormalities. Left ventricular diastolic parameters are  consistent with Grade I diastolic dysfunction (impaired relaxation).  2. Right ventricular systolic function is normal. The right ventricular size is normal. Tricuspid regurgitation signal is inadequate for assessing PA pressure.  3. Left atrial size was mildly dilated.  4. The mitral valve is normal in structure. Trivial mitral valve regurgitation. No  evidence of mitral stenosis.  5. The aortic valve is tricuspid. Aortic valve regurgitation is not visualized. Mild aortic valve sclerosis is present, with no evidence of aortic valve stenosis.  6. The inferior vena cava is dilated in size with <50% respiratory variability, suggesting right atrial pressure of 15 mmHg.  7. There is a small to moderate predominantly lateral pericardial effusion, no tamponade. FINDINGS  Left Ventricle: Left ventricular ejection fraction, by estimation, is 60 to 65%. The left ventricle has normal function. The left ventricle has no regional wall motion abnormalities. Definity contrast agent was given IV to delineate the left ventricular  endocardial borders. The left ventricular internal cavity size was normal in size. There is no left ventricular hypertrophy. Left ventricular diastolic parameters are consistent with Grade I diastolic dysfunction (impaired relaxation). Right Ventricle: The right ventricular size is normal. No increase in right ventricular wall thickness. Right ventricular systolic function is normal. Tricuspid regurgitation signal is inadequate for assessing PA pressure. Left Atrium: Left atrial size was mildly dilated. Right Atrium: Right atrial size was normal in size. Pericardium: There is a small to moderate predominantly lateral pericardial effusion, no tamponade. A small pericardial effusion is present. Mitral Valve: The mitral valve is normal in structure. Mild mitral annular calcification. Trivial mitral valve regurgitation. No evidence of mitral valve stenosis. Tricuspid Valve: The tricuspid valve is normal in structure. Tricuspid valve regurgitation is trivial. Aortic Valve: The aortic valve is tricuspid. Aortic valve regurgitation is not visualized. Mild aortic valve sclerosis is present, with no evidence of aortic valve stenosis. Pulmonic Valve: The pulmonic valve was normal in structure. Pulmonic valve regurgitation is trivial. Aorta: The aortic root is  normal in size and structure. Venous: The inferior vena cava is dilated in size with less than 50% respiratory variability, suggesting right atrial pressure of 15 mmHg. IAS/Shunts: No atrial level shunt detected by color flow Doppler.  LEFT VENTRICLE PLAX 2D LVIDd:         5.10 cm      Diastology LVIDs:         3.30 cm      LV e' lateral:   6.96 cm/s LV PW:         1.00 cm      LV E/e' lateral: 15.1 LV IVS:        1.00 cm      LV e' medial:    3.81 cm/s LVOT diam:     1.90 cm      LV E/e' medial:  27.6 LV SV:         89 LV SV Index:   37 LVOT Area:     2.84 cm  LV Volumes (MOD) LV vol d, MOD A2C: 156.0 ml LV vol d, MOD A4C: 127.0 ml LV vol s, MOD A2C: 47.6 ml LV vol s, MOD A4C: 42.8 ml LV SV MOD A2C:     108.4 ml LV SV MOD A4C:     127.0 ml LV SV MOD BP:      97.2 ml LEFT ATRIUM             Index LA diam:        3.70 cm 1.55  cm/m LA Vol (A2C):   78.7 ml 32.93 ml/m LA Vol (A4C):   85.9 ml 35.94 ml/m LA Biplane Vol: 90.6 ml 37.91 ml/m  AORTIC VALVE LVOT Vmax:   135.00 cm/s LVOT Vmean:  89.400 cm/s LVOT VTI:    0.313 m  AORTA Ao Root diam: 2.90 cm MITRAL VALVE MV Area (PHT): 3.08 cm     SHUNTS MV Decel Time: 246 msec     Systemic VTI:  0.31 m MV E velocity: 105.00 cm/s  Systemic Diam: 1.90 cm MV A velocity: 111.00 cm/s MV E/A ratio:  0.95 Loralie Champagne MD Electronically signed by Loralie Champagne MD Signature Date/Time: 10/08/2019/4:18:50 PM    Final    VAS [place] LOWER EXTREMITY VENOUS (DVT)  Result Date: 10/08/2019  Lower Venous DVTStudy Indications: Pain, and Swelling.  Limitations: Body habitus and depth of vessels, and patient pain/intolerance to compression. Comparison Study: No prior studies. Performing Technologist: Darlin Coco  Examination Guidelines: A complete evaluation includes B-mode imaging, spectral Doppler, color Doppler, and power Doppler as needed of all accessible portions of each vessel. Bilateral testing is considered an integral part of a complete examination. Limited examinations for  reoccurring indications may be performed as noted. The reflux portion of the exam is performed with the patient in reverse Trendelenburg.  +---------+---------------+---------+-----------+----------+--------------+ RIGHT    CompressibilityPhasicitySpontaneityPropertiesThrombus Aging +---------+---------------+---------+-----------+----------+--------------+ CFV      Full           Yes      Yes                                 +---------+---------------+---------+-----------+----------+--------------+ SFJ      Full                                                        +---------+---------------+---------+-----------+----------+--------------+ FV Prox  Full                    Yes                                 +---------+---------------+---------+-----------+----------+--------------+ FV Mid                  Yes      Yes                                 +---------+---------------+---------+-----------+----------+--------------+ FV Distal               Yes      Yes                                 +---------+---------------+---------+-----------+----------+--------------+ PFV                     Yes      Yes                                 +---------+---------------+---------+-----------+----------+--------------+ POP      Full  Yes      Yes                                 +---------+---------------+---------+-----------+----------+--------------+ PTV      Full                                                        +---------+---------------+---------+-----------+----------+--------------+ PERO     Full                                                        +---------+---------------+---------+-----------+----------+--------------+   +---------+---------------+---------+-----------+----------+-----------------+ LEFT     CompressibilityPhasicitySpontaneityPropertiesThrombus Aging     +---------+---------------+---------+-----------+----------+-----------------+ CFV      Full           Yes      Yes                                    +---------+---------------+---------+-----------+----------+-----------------+ SFJ      Full                                                           +---------+---------------+---------+-----------+----------+-----------------+ FV Prox  Full                                                           +---------+---------------+---------+-----------+----------+-----------------+ FV Mid                  Yes      Yes                                    +---------+---------------+---------+-----------+----------+-----------------+ FV Distal               Yes      Yes                                    +---------+---------------+---------+-----------+----------+-----------------+ PFV      Full                                                           +---------+---------------+---------+-----------+----------+-----------------+ POP      Full           Yes      Yes                                    +---------+---------------+---------+-----------+----------+-----------------+  PTV      Partial                                      Age Indeterminate +---------+---------------+---------+-----------+----------+-----------------+ PERO                    Yes      Yes                                    +---------+---------------+---------+-----------+----------+-----------------+     Summary: RIGHT: - There is no evidence of deep vein thrombosis in the lower extremity. However, portions of this examination were limited- see technologist comments above.  - No cystic structure found in the popliteal fossa.  LEFT: - Findings consistent with age indeterminate deep vein thrombosis involving the left posterior tibial veins. - Portions of this examination were limited- see technologist comments above. - No cystic structure  found in the popliteal fossa.  *See table(s) above for measurements and observations. Electronically signed by Ruta Hinds MD on 10/08/2019 at 6:14:25 PM.    Final      Scheduled Meds: . amLODipine  10 mg Oral Daily  . aspirin EC  81 mg Oral Daily  . atorvastatin  40 mg Oral Daily  . carvedilol  25 mg Oral BID WC  . Chlorhexidine Gluconate Cloth  6 each Topical Daily  . doxazosin  2 mg Oral QHS  . ferrous sulfate  325 mg Oral Q breakfast  . heparin  5,000 Units Subcutaneous Q8H  . levothyroxine  25 mcg Oral Daily   Continuous Infusions:   LOS: 1 day    Time spent: 25 minutes spent in the coordination of care today.    Jonnie Finner, DO [facility]  If 7PM-7AM, please contact night-coverage [email or web address] 10/09/2019, 3:21 PM

## 2019-10-09 NOTE — Progress Notes (Signed)
[facility] Progress Note   Subjective:   Feeling sore all over.  Tolerating HD ok currently   Objective Vitals:   10/08/19 1735 10/08/19 2052 10/09/19 0034 10/09/19 0400  BP: (!) 153/60 (!) 121/55 (!) 125/48 (!) 146/58  Pulse: 77 82 78 75  Resp: 11 15 (!) 23 17  Temp: 98 F (36.7 C) 97.8 F (36.6 C) 98.1 F (36.7 C) 98.4 F (36.9 C)  TempSrc: Oral Oral Oral Oral  SpO2: 93% 94% 95% 94%  Weight:      Height:       Physical Exam General: obese, uncomfortable but nontoxic Heart: RRR Lungs: normal WOB at rest Abdomen: sfot, nontender Extremities: trace edema Dialysis Access: [facility] c/d/i  Additional Objective Labs: Basic Metabolic Panel: Recent Labs  Lab 10/05/19 0540 10/05/19 0540 10/06/19 0318 10/06/19 0318 10/07/19 0223 10/08/19 0259 10/08/19 0415 10/08/19 0442 10/08/19 0801 10/09/19 0501  NA 139  139   < > 139   < > 140   < > 139 141  --  142  K 3.2*  3.1*   < > 3.8   < > 3.5   < > 4.2 4.0  --  3.9  CL 104  104   < > 107   < > 106  --  105  --   --  108  CO2 26  26   < > 23   < > 29  --  26  --   --  23  GLUCOSE 109*  109*   < > 133*   < > 131*  --  195*  --   --  160*  BUN 18  19   < > 25*   < > 14  --  21  --   --  28*  CREATININE 3.98*  3.93*   < > 5.68*   < > 4.46*  --  5.82*  --  5.91* 7.03*  CALCIUM 8.3*  8.2*   < > 9.3   < > 9.3  --  9.7  --   --  9.4  PHOS 3.6  --  6.5*  --  5.4*  --   --   --   --   --    < > = values in this interval not displayed.   Liver Function Tests: Recent Labs  Lab 10/05/19 0540 10/06/19 0318 10/07/19 0223 10/08/19 0415 10/09/19 0501  AST 16  --   --  26 25  ALT 12  --   --  10 6  ALKPHOS 55  --   --  49 61  BILITOT 0.4  --   --  0.3 0.6  PROT 5.1*  --   --  4.9* 4.8*  ALBUMIN 2.0*  1.9*   < > 1.8* 1.9* 1.8*   < > = values in this interval not displayed.   No results for input(s): LIPASE, AMYLASE in the last 168 hours. CBC: Recent Labs  Lab 10/04/19 0449 10/04/19 0449  10/05/19 0540 10/05/19 0540 10/07/19 0223 10/07/19 1833 10/08/19 0415 10/08/19 0442 10/09/19 0501  WBC 6.4   < > 6.3   < > 6.5  --  10.1  --  7.8  NEUTROABS 4.7  --  4.2  --   --   --  9.0*  --   --   HGB 7.2*   < > 7.3*   < > 6.8*   < > 7.7* 9.9* 7.3*  HCT 25.0*   < >  25.4*   < > 24.7*   < > 28.1* 29.0* 26.2*  MCV 93.6  --  93.4  --  96.9  --  100.0  --  98.5  PLT 119*   < > 122*   < > 106*  --  116*  --  103*   < > = values in this interval not displayed.   Blood Culture No results found for: SDES, SPECREQUEST, CULT, REPTSTATUS  Cardiac Enzymes: No results for input(s): CKTOTAL, CKMB, CKMBINDEX, TROPONINI in the last 168 hours. CBG: Recent Labs  Lab 10/08/19 1556 10/08/19 2051 10/09/19 0033 10/09/19 0412 10/09/19 0716  GLUCAP 108* 193* 154* 136* 142*   Iron Studies: No results for input(s): IRON, TIBC, TRANSFERRIN, FERRITIN in the last 72 hours. @lablastinr3 @ Studies/Results: CT Head Wo Contrast  Result Date: 10/08/2019 CLINICAL DATA:  Syncope EXAM: CT HEAD WITHOUT CONTRAST TECHNIQUE: Contiguous axial images were obtained from the base of the skull through the vertex without intravenous contrast. COMPARISON:  None. FINDINGS: Brain: No acute infarct or hemorrhage. Lateral ventricles and midline structures are unremarkable. No acute extra-axial fluid collections. No mass effect. Vascular: No hyperdense vessel or unexpected calcification. Skull: Normal. Negative for fracture or focal lesion. Sinuses/Orbits: No acute finding. Other: None. IMPRESSION: 1. No acute intracranial process. Electronically Signed   By: Randa Ngo M.D.   On: 10/08/2019 01:08   CT Cervical Spine Wo Contrast  Result Date: 10/08/2019 CLINICAL DATA:  Syncope EXAM: CT CERVICAL SPINE WITHOUT CONTRAST TECHNIQUE: Multidetector CT imaging of the cervical spine was performed without intravenous contrast. Multiplanar CT image reconstructions were also generated. COMPARISON:  None. FINDINGS: Alignment: There is  reversal of the normal cervical lordosis. Skull base and vertebrae: Visualized skull base is intact. No atlanto-occipital dissociation. The vertebral body heights are well maintained. No fracture or pathologic osseous lesion seen. Soft tissues and spinal canal: The visualized paraspinal soft tissues are unremarkable. No prevertebral soft tissue swelling is seen. The spinal canal is grossly unremarkable, no large epidural collection or significant canal narrowing. Disc levels: Disc height loss with anterior osteophytes disc osteophyte complex and uncovertebral osteophytes is most notable at C4-C5 and C5-C6 with moderate neural foraminal narrowing and mild effacement of the anterior thecal sac. Upper chest: The lung apices are clear. Thoracic inlet is within normal limits. Scattered aortic atherosclerosis is seen. Other: None IMPRESSION: No acute fracture or malalignment of the spine. Aortic Atherosclerosis (ICD10-I70.0). Electronically Signed   By: Prudencio Pair M.D.   On: 10/08/2019 01:21   DG Chest Portable 1 View  Result Date: 10/08/2019 CLINICAL DATA:  Cough EXAM: PORTABLE CHEST 1 VIEW COMPARISON:  March 26, 2007 FINDINGS: There is mild cardiomegaly. A right-sided central venous catheter seen with the tip at the superior cavoatrial junction. There is mild prominence of the central pulmonary vasculature. There is patchy airspace opacity seen within the retrocardiac region. The visualized skeletal structures are unremarkable. IMPRESSION: cardiomegaly and mild pulmonary vascular congestion. Patchy airspace opacity in the retrocardiac region which could be due to atelectasis and/or early infectious etiology Electronically Signed   By: Prudencio Pair M.D.   On: 10/08/2019 01:03   ECHOCARDIOGRAM COMPLETE  Result Date: 10/08/2019    ECHOCARDIOGRAM REPORT   Patient Name:   Kristen English Glab Date of Exam: 10/08/2019 Medical Rec #:  [id number]        Height:       68.0 in Accession #:    [id number]       Weight:  289.0 lb Date of Birth:  13-Oct-1955       BSA:          2.390 m Patient Age:    64 years         BP:           150/57 mmHg Patient Gender: F                HR:           62 bpm. Exam Location:  Inpatient Procedure: 2D Echo and Intracardiac Opacification Agent Indications:    Syncope 780.2 / R55  History:        Patient has no prior history of Echocardiogram examinations.                 Risk Factors:Hypertension and Diabetes. Chronic kidney disease,                 GOUT, thyroid disease.  Sonographer:    Darlina Sicilian RDCS Referring Phys: [id number] [place]  1. Left ventricular ejection fraction, by estimation, is 60 to 65%. The left ventricle has normal function. The left ventricle has no regional wall motion abnormalities. Left ventricular diastolic parameters are consistent with Grade I diastolic dysfunction (impaired relaxation).  2. Right ventricular systolic function is normal. The right ventricular size is normal. Tricuspid regurgitation signal is inadequate for assessing PA pressure.  3. Left atrial size was mildly dilated.  4. The mitral valve is normal in structure. Trivial mitral valve regurgitation. No evidence of mitral stenosis.  5. The aortic valve is tricuspid. Aortic valve regurgitation is not visualized. Mild aortic valve sclerosis is present, with no evidence of aortic valve stenosis.  6. The inferior vena cava is dilated in size with <50% respiratory variability, suggesting right atrial pressure of 15 mmHg.  7. There is a small to moderate predominantly lateral pericardial effusion, no tamponade. FINDINGS  Left Ventricle: Left ventricular ejection fraction, by estimation, is 60 to 65%. The left ventricle has normal function. The left ventricle has no regional wall motion abnormalities. Definity contrast agent was given IV to delineate the left ventricular  endocardial borders. The left ventricular internal cavity size was normal in size. There is no left ventricular hypertrophy.  Left ventricular diastolic parameters are consistent with Grade I diastolic dysfunction (impaired relaxation). Right Ventricle: The right ventricular size is normal. No increase in right ventricular wall thickness. Right ventricular systolic function is normal. Tricuspid regurgitation signal is inadequate for assessing PA pressure. Left Atrium: Left atrial size was mildly dilated. Right Atrium: Right atrial size was normal in size. Pericardium: There is a small to moderate predominantly lateral pericardial effusion, no tamponade. A small pericardial effusion is present. Mitral Valve: The mitral valve is normal in structure. Mild mitral annular calcification. Trivial mitral valve regurgitation. No evidence of mitral valve stenosis. Tricuspid Valve: The tricuspid valve is normal in structure. Tricuspid valve regurgitation is trivial. Aortic Valve: The aortic valve is tricuspid. Aortic valve regurgitation is not visualized. Mild aortic valve sclerosis is present, with no evidence of aortic valve stenosis. Pulmonic Valve: The pulmonic valve was normal in structure. Pulmonic valve regurgitation is trivial. Aorta: The aortic root is normal in size and structure. Venous: The inferior vena cava is dilated in size with less than 50% respiratory variability, suggesting right atrial pressure of 15 mmHg. IAS/Shunts: No atrial level shunt detected by color flow Doppler.  LEFT VENTRICLE PLAX 2D LVIDd:         5.10  cm      Diastology LVIDs:         3.30 cm      LV e' lateral:   6.96 cm/s LV PW:         1.00 cm      LV E/e' lateral: 15.1 LV IVS:        1.00 cm      LV e' medial:    3.81 cm/s LVOT diam:     1.90 cm      LV E/e' medial:  27.6 LV SV:         89 LV SV Index:   37 LVOT Area:     2.84 cm  LV Volumes (MOD) LV vol d, MOD A2C: 156.0 ml LV vol d, MOD A4C: 127.0 ml LV vol s, MOD A2C: 47.6 ml LV vol s, MOD A4C: 42.8 ml LV SV MOD A2C:     108.4 ml LV SV MOD A4C:     127.0 ml LV SV MOD BP:      97.2 ml LEFT ATRIUM              Index LA diam:        3.70 cm 1.55 cm/m LA Vol (A2C):   78.7 ml 32.93 ml/m LA Vol (A4C):   85.9 ml 35.94 ml/m LA Biplane Vol: 90.6 ml 37.91 ml/m  AORTIC VALVE LVOT Vmax:   135.00 cm/s LVOT Vmean:  89.400 cm/s LVOT VTI:    0.313 m  AORTA Ao Root diam: 2.90 cm MITRAL VALVE MV Area (PHT): 3.08 cm     SHUNTS MV Decel Time: 246 msec     Systemic VTI:  0.31 m MV E velocity: 105.00 cm/s  Systemic Diam: 1.90 cm MV A velocity: 111.00 cm/s MV E/A ratio:  0.95 Loralie Champagne MD Electronically signed by Loralie Champagne MD Signature Date/Time: 10/08/2019/4:18:50 PM    Final    VAS [place] LOWER EXTREMITY VENOUS (DVT)  Result Date: 10/08/2019  Lower Venous DVTStudy Indications: Pain, and Swelling.  Limitations: Body habitus and depth of vessels, and patient pain/intolerance to compression. Comparison Study: No prior studies. Performing Technologist: Darlin Coco  Examination Guidelines: A complete evaluation includes B-mode imaging, spectral Doppler, color Doppler, and power Doppler as needed of all accessible portions of each vessel. Bilateral testing is considered an integral part of a complete examination. Limited examinations for reoccurring indications may be performed as noted. The reflux portion of the exam is performed with the patient in reverse Trendelenburg.  +---------+---------------+---------+-----------+----------+--------------+ RIGHT    CompressibilityPhasicitySpontaneityPropertiesThrombus Aging +---------+---------------+---------+-----------+----------+--------------+ CFV      Full           Yes      Yes                                 +---------+---------------+---------+-----------+----------+--------------+ SFJ      Full                                                        +---------+---------------+---------+-----------+----------+--------------+ FV Prox  Full                    Yes                                  +---------+---------------+---------+-----------+----------+--------------+  FV Mid                  Yes      Yes                                 +---------+---------------+---------+-----------+----------+--------------+ FV Distal               Yes      Yes                                 +---------+---------------+---------+-----------+----------+--------------+ PFV                     Yes      Yes                                 +---------+---------------+---------+-----------+----------+--------------+ POP      Full           Yes      Yes                                 +---------+---------------+---------+-----------+----------+--------------+ PTV      Full                                                        +---------+---------------+---------+-----------+----------+--------------+ PERO     Full                                                        +---------+---------------+---------+-----------+----------+--------------+   +---------+---------------+---------+-----------+----------+-----------------+ LEFT     CompressibilityPhasicitySpontaneityPropertiesThrombus Aging    +---------+---------------+---------+-----------+----------+-----------------+ CFV      Full           Yes      Yes                                    +---------+---------------+---------+-----------+----------+-----------------+ SFJ      Full                                                           +---------+---------------+---------+-----------+----------+-----------------+ FV Prox  Full                                                           +---------+---------------+---------+-----------+----------+-----------------+ FV Mid                  Yes      Yes                                    +---------+---------------+---------+-----------+----------+-----------------+  FV Distal               Yes      Yes                                     +---------+---------------+---------+-----------+----------+-----------------+ PFV      Full                                                           +---------+---------------+---------+-----------+----------+-----------------+ POP      Full           Yes      Yes                                    +---------+---------------+---------+-----------+----------+-----------------+ PTV      Partial                                      Age Indeterminate +---------+---------------+---------+-----------+----------+-----------------+ PERO                    Yes      Yes                                    +---------+---------------+---------+-----------+----------+-----------------+     Summary: RIGHT: - There is no evidence of deep vein thrombosis in the lower extremity. However, portions of this examination were limited- see technologist comments above.  - No cystic structure found in the popliteal fossa.  LEFT: - Findings consistent with age indeterminate deep vein thrombosis involving the left posterior tibial veins. - Portions of this examination were limited- see technologist comments above. - No cystic structure found in the popliteal fossa.  *See table(s) above for measurements and observations. Electronically signed by Ruta Hinds MD on 10/08/2019 at 6:14:25 PM.    Final    Medications:  . amLODipine  10 mg Oral Daily  . aspirin EC  81 mg Oral Daily  . atorvastatin  40 mg Oral Daily  . carvedilol  25 mg Oral BID WC  . Chlorhexidine Gluconate Cloth  6 each Topical Daily  . doxazosin  2 mg Oral QHS  . ferrous sulfate  325 mg Oral Q breakfast  . heparin  5,000 Units Subcutaneous Q8H  . levothyroxine  25 mcg Oral Daily    Dialysis Orders:  [facility] TTS - 1st outpatient tmt was scheduled for 7/22  Assessment/Plan: 1. Acute hypercarbic respiratory failure. Improved with BiPAP support, not currently requiring. Per primary team -to f/u for sleep study as outpatient  2. ESRD -   HD TTS. HD started 10/03/19.  Using [facility]. RUE BCF placed 7/20. Continue HD on schedule. Next HD 7/24.  3. Hypertension/volume  - On amlodipine 10,  carvedilol 25 bid. Establishing dry weight. Does have volume on exam. UF 3L next HD.  4. Anemia  - Holding ESA 2/2 possible RCC. Tsat 40%. Transfuse prn.  5. Metabolic bone disease -  No binders currently. Follow Ca/Phos.  6. R Renal mass  -  followed by urology as outpatient, likely to req nephrectomy  Jannifer Hick MD 10/09/2019, 8:38 AM  [facility] Pager: [phone]

## 2019-10-10 DIAGNOSIS — L299 Pruritus, unspecified: Secondary | ICD-10-CM | POA: Insufficient documentation

## 2019-10-10 DIAGNOSIS — R809 Proteinuria, unspecified: Secondary | ICD-10-CM | POA: Insufficient documentation

## 2019-10-10 DIAGNOSIS — N186 End stage renal disease: Secondary | ICD-10-CM | POA: Insufficient documentation

## 2019-10-10 DIAGNOSIS — N2581 Secondary hyperparathyroidism of renal origin: Secondary | ICD-10-CM | POA: Insufficient documentation

## 2019-10-10 DIAGNOSIS — T829XXA Unspecified complication of cardiac and vascular prosthetic device, implant and graft, initial encounter: Secondary | ICD-10-CM | POA: Insufficient documentation

## 2019-10-10 DIAGNOSIS — I10 Essential (primary) hypertension: Secondary | ICD-10-CM

## 2019-10-10 DIAGNOSIS — E1122 Type 2 diabetes mellitus with diabetic chronic kidney disease: Secondary | ICD-10-CM | POA: Insufficient documentation

## 2019-10-10 DIAGNOSIS — D509 Iron deficiency anemia, unspecified: Secondary | ICD-10-CM | POA: Insufficient documentation

## 2019-10-10 DIAGNOSIS — R079 Chest pain, unspecified: Secondary | ICD-10-CM | POA: Insufficient documentation

## 2019-10-10 DIAGNOSIS — R52 Pain, unspecified: Secondary | ICD-10-CM | POA: Insufficient documentation

## 2019-10-10 DIAGNOSIS — D689 Coagulation defect, unspecified: Secondary | ICD-10-CM | POA: Insufficient documentation

## 2019-10-10 DIAGNOSIS — R0600 Dyspnea, unspecified: Secondary | ICD-10-CM | POA: Insufficient documentation

## 2019-10-10 DIAGNOSIS — M150 Primary generalized (osteo)arthritis: Secondary | ICD-10-CM | POA: Insufficient documentation

## 2019-10-10 DIAGNOSIS — E440 Moderate protein-calorie malnutrition: Secondary | ICD-10-CM | POA: Insufficient documentation

## 2019-10-10 DIAGNOSIS — D631 Anemia in chronic kidney disease: Secondary | ICD-10-CM | POA: Insufficient documentation

## 2019-10-10 DIAGNOSIS — I5031 Acute diastolic (congestive) heart failure: Secondary | ICD-10-CM

## 2019-10-10 DIAGNOSIS — D649 Anemia, unspecified: Secondary | ICD-10-CM

## 2019-10-10 DIAGNOSIS — T7840XS Allergy, unspecified, sequela: Secondary | ICD-10-CM | POA: Insufficient documentation

## 2019-10-10 DIAGNOSIS — T782XXS Anaphylactic shock, unspecified, sequela: Secondary | ICD-10-CM | POA: Insufficient documentation

## 2019-10-10 LAB — CBC WITH DIFFERENTIAL/PLATELET
Abs Immature Granulocytes: 0.03 10*3/uL (ref 0.00–0.07)
Basophils Absolute: 0 10*3/uL (ref 0.0–0.1)
Basophils Relative: 0 %
Eosinophils Absolute: 0.5 10*3/uL (ref 0.0–0.5)
Eosinophils Relative: 8 %
HCT: 28.7 % — ABNORMAL LOW (ref 36.0–46.0)
Hemoglobin: 8.1 g/dL — ABNORMAL LOW (ref 12.0–15.0)
Immature Granulocytes: 1 %
Lymphocytes Relative: 16 %
Lymphs Abs: 1 10*3/uL (ref 0.7–4.0)
MCH: 27 pg (ref 26.0–34.0)
MCHC: 28.2 g/dL — ABNORMAL LOW (ref 30.0–36.0)
MCV: 95.7 fL (ref 80.0–100.0)
Monocytes Absolute: 0.5 10*3/uL (ref 0.1–1.0)
Monocytes Relative: 8 %
Neutro Abs: 4.2 10*3/uL (ref 1.7–7.7)
Neutrophils Relative %: 67 %
Platelets: 125 10*3/uL — ABNORMAL LOW (ref 150–400)
RBC: 3 MIL/uL — ABNORMAL LOW (ref 3.87–5.11)
RDW: 14.9 % (ref 11.5–15.5)
WBC: 6.3 10*3/uL (ref 4.0–10.5)
nRBC: 0 % (ref 0.0–0.2)

## 2019-10-10 LAB — RENAL FUNCTION PANEL
Albumin: 2 g/dL — ABNORMAL LOW (ref 3.5–5.0)
Anion gap: 9 (ref 5–15)
BUN: 21 mg/dL (ref 8–23)
CO2: 26 mmol/L (ref 22–32)
Calcium: 9.6 mg/dL (ref 8.9–10.3)
Chloride: 105 mmol/L (ref 98–111)
Creatinine, Ser: 5.12 mg/dL — ABNORMAL HIGH (ref 0.44–1.00)
GFR calc Af Amer: 10 mL/min — ABNORMAL LOW (ref >60)
GFR calc non Af Amer: 8 mL/min — ABNORMAL LOW (ref >60)
Glucose, Bld: 176 mg/dL — ABNORMAL HIGH (ref 70–99)
Phosphorus: 4.5 mg/dL (ref 2.5–4.6)
Potassium: 3.3 mmol/L — ABNORMAL LOW (ref 3.5–5.1)
Sodium: 140 mmol/L (ref 135–145)

## 2019-10-10 LAB — MAGNESIUM: Magnesium: 1.8 mg/dL (ref 1.7–2.4)

## 2019-10-10 LAB — GLUCOSE, CAPILLARY
Glucose-Capillary: 111 mg/dL — ABNORMAL HIGH (ref 70–99)
Glucose-Capillary: 138 mg/dL — ABNORMAL HIGH (ref 70–99)
Glucose-Capillary: 169 mg/dL — ABNORMAL HIGH (ref 70–99)
Glucose-Capillary: 96 mg/dL (ref 70–99)

## 2019-10-10 NOTE — TOC Transition Note (Addendum)
Transition of Care [facility]) - CM/SW Discharge Note   Patient Details  Name: Kristen English MRN: [id number] Date of Birth: Feb 19, 1956  Transition of [facility] CM/SW Contact:  Verdell Carmine, RN Phone Number: 10/10/2019, 2:36 PM   Clinical Narrative:    Patient desaturating at night Dr. Marylyn Ishihara called and we discussed home o2.nocturnal. Looking at records would probably need o2 continuous. Asked RN sarah to exert the patient again. RA saturations  87% up to 93 on 2L. Spoke to patient gave choice of agencies for O2 went with [facility]. Called [employer] and set up for a tank to be delivered to room and the rest delivered to home. Explained this to patient. Also suggested for patient to get a finger saturation.probe and document measurement to share with her primary doctor. She asked about fluid restrictions. Usual fluid restriction for dialysis patient is no more than 1.5 liters per day.  Talked to Dr Marylyn Ishihara, Patient could use [facility], lives in [place], has primary care in network, would be a good extra layer of care. They can provide pulse ox, education frequent checks. Called [facility], and looked at chart. She will research more but recommend remote health /CHF for patient as she has a higher risk of readmission with home O2    Final next level of care: Home/Self Care Barriers to Discharge: No Barriers Identified   Patient Goals and CMS Choice Patient states their goals for this hospitalization and ongoing recovery are:: Home   Choice offered to / list presented to : Patient  Discharge Placement             Home with O2          Discharge Plan and Services                DME Arranged: Oxygen DME Agency: [employer] Date DME Agency Contacted: 10/10/19 Time DME Agency Contacted: [phone]              Social Determinants of Health (SDOH) Interventions     Readmission Risk Interventions Readmission Risk Prevention Plan 10/06/2019  Transportation Screening Complete   PCP or Specialist Appt within 3-5 Days Not Complete  HRI or Home Care Consult Complete  Social Work Consult for [place] Planning/Counseling Complete  Palliative Care Screening Not Applicable  Medication Review Press photographer) Referral to Pharmacy  Some recent data might be hidden

## 2019-10-10 NOTE — Progress Notes (Signed)
NAME:  Kristen English, MRN:  [id number], DOB:  1955/12/09, LOS: 2 ADMISSION DATE:  10/08/2019, CONSULTATION DATE:  7/21 REFERRING MD:  Dr Waldron Labs, CHIEF COMPLAINT:  Syncope   Brief History   64 year old female found down by son minimally responsive. Hypercarbic in ED requiring BiPAP. Periods of apnea while on non-invasive. PCCM consulted.   History of present illness   64 year old female with PMH as below, which is significant for CKD, DM, right renal mass, and HTN.  She has 2 recent admissions to [facility] for from 7/10 through 7/12 and again from 7/15 through 7/20.  Both of these admissions were for renal failure.  During her most recent admission she had a tunneled catheter placed and was started on dialysis.  She underwent AV fistula placement on 7/20 by Dr. Doren Custard.  She was transfused 1 unit PRBC on the day of discharge for hemoglobin 6.8.  She was scheduled to start outpatient hemodialysis on 7/22   In the late p.m. hours of 7/20 shortly after discharge she was left home alone around 9 PM.  When her son returned 11 PM she was found down.  She was minimally responsive and EMS was called.  Upon EMS arrival she was responsive to sternal rub with reported GCS of 12.  Work-up in the emergency department included imaging of the head and neck which were within normal limits.  Laboratory evaluation significant for ABG with respiratory acidosis and hypoxemia.  She was started on BiPAP and admitted to the hospitalist service. She was having periods of apnea on non-invasive and PCCM was called.   Past Medical History   has a past medical history of Arthritis, Chronic kidney disease, Diabetes mellitus without complication ([place]), Gout, and Hypertension.  Significant Hospital Events   7/10 admit for renal failre 7/15 > 7/20 admit for renal failure, started on HD 7/20 admit found down 7/21 hypercarbic and hypoxic respiratory failure, on BiPAP with perioids of apnea. PCCM consulted 7/22  weaned to [facility] then to RA. Underwent HD  7/23 remains on RA  Consults:  PCCM Nephrology  Procedures:    Significant Diagnostic Tests:  CT head/Cspine 7/20 > WNL  Micro Data:  7/21 SARS Cov2>neg   Antimicrobials:     Interim history/subjective:   Remains on RA States she is feeling ready to go home   Objective   Blood pressure (!) 128/52, pulse 83, temperature 98.4 F (36.9 C), temperature source Oral, resp. rate 23, height 5\' 8"  (1.727 m), weight (!) 129.9 kg, SpO2 96 %.        Intake/Output Summary (Last 24 hours) at 10/10/2019 0956 Last data filed at 10/09/2019 2050 Gross per 24 hour  Intake 960 ml  Output 3000 ml  Net -2040 ml   Filed Weights   10/08/19 0042 10/09/19 0727 10/09/19 1100  Weight: 131.1 kg 133 kg (!) 129.9 kg    Examination: General: Adult F, reclined in bed talking on phone in NAD  HENT: NCAT Anicteric sclera pink mmm Lungs: Symmetrical chest expansion, even unlabored respirations on RA. CTAb Cardiovascular: RRR s1s2 no rgm  Abdomen: Soft round ndnt Extremities: RUE fistula site intact. No obvious joint deformity  Neuro: AAOx3 following commands PERRLA  Resolved Hospital Problem list     Assessment & Plan:   Acute hypercarbic and hypoxic respiratory failure, improved -Acute resp failure likely 2/2 volume overload in setting of renal failure.  -initial periods of apnea suggest possible underlying sleep disordered breathing -- Placed  on bipap 7/21, weaned to [place] then to room air which she continues to tolerate well  P -Will discuss with rounding pulmonologist RE possible OP follow up for sleep study  -Cont to encourage mobility, IS/pulm hygiene  -volume removal per nephrology    ESRD on HD TTS - AV fistula placed 7/20. HD access is tunneled cath placed 2 weeks ago P -HD per nephrology   DM  HTN Anemia - per primary   Best practice:  Diet: Carb Mod  Pain/Anxiety/Delirium protocol (if indicated): NA VAP protocol (if indicated):  NA DVT prophylaxis: lovenox GI prophylaxis: NA Glucose control: Per primary Mobility: BR Code Status: FULL Family Communication: Pt updated  At bedside  Disposition: PCU -- likely approaching discharge readiness   Labs   CBC: Recent Labs  Lab 10/04/19 0449 10/04/19 0449 10/05/19 0540 10/05/19 0540 10/07/19 0223 10/07/19 0223 10/07/19 1833 10/08/19 0259 10/08/19 0415 10/08/19 0442 10/09/19 0501  WBC 6.4  --  6.3  --  6.5  --   --   --  10.1  --  7.8  NEUTROABS 4.7  --  4.2  --   --   --   --   --  9.0*  --   --   HGB 7.2*   < > 7.3*   < > 6.8*   < > 8.1* 8.5* 7.7* 9.9* 7.3*  HCT 25.0*   < > 25.4*   < > 24.7*   < > 29.0* 25.0* 28.1* 29.0* 26.2*  MCV 93.6  --  93.4  --  96.9  --   --   --  100.0  --  98.5  PLT 119*  --  122*  --  106*  --   --   --  116*  --  103*   < > = values in this interval not displayed.    Basic Metabolic Panel: Recent Labs  Lab 10/04/19 0449 10/04/19 0449 10/05/19 0540 10/05/19 0540 10/06/19 0318 10/06/19 0318 10/07/19 0223 10/08/19 0259 10/08/19 0415 10/08/19 0442 10/08/19 0801 10/09/19 0501  NA 142   < > 139  139   < > 139   < > 140 141 139 141  --  142  K 3.5   < > 3.2*  3.1*   < > 3.8   < > 3.5 4.1 4.2 4.0  --  3.9  CL 111   < > 104  104  --  107  --  106  --  105  --   --  108  CO2 22   < > 26  26  --  23  --  29  --  26  --   --  23  GLUCOSE 167*   < > 109*  109*  --  133*  --  131*  --  195*  --   --  160*  BUN 40*   < > 18  19  --  25*  --  14  --  21  --   --  28*  CREATININE 7.04*   < > 3.98*  3.93*   < > 5.68*  --  4.46*  --  5.82*  --  5.91* 7.03*  CALCIUM 9.0   < > 8.3*  8.2*  --  9.3  --  9.3  --  9.7  --   --  9.4  MG  --   --   --   --   --   --   --   --   --   --   --  2.0  PHOS 6.6*  --  3.6  --  6.5*  --  5.4*  --   --   --   --   --    < > = values in this interval not displayed.   GFR: Estimated Creatinine Clearance: 11.4 mL/min (A) (by C-G formula based on SCr of 7.03 mg/dL (H)). Recent Labs  Lab  10/05/19 0540 10/07/19 0223 10/08/19 0415 10/09/19 0501  WBC 6.3 6.5 10.1 7.8    Liver Function Tests: Recent Labs  Lab 10/05/19 0540 10/06/19 0318 10/07/19 0223 10/08/19 0415 10/09/19 0501  AST 16  --   --  26 25  ALT 12  --   --  10 6  ALKPHOS 55  --   --  49 61  BILITOT 0.4  --   --  0.3 0.6  PROT 5.1*  --   --  4.9* 4.8*  ALBUMIN 2.0*  1.9* 1.9* 1.8* 1.9* 1.8*   No results for input(s): LIPASE, AMYLASE in the last 168 hours. No results for input(s): AMMONIA in the last 168 hours.  ABG    Component Value Date/Time   PHART 7.133 (LL) 10/08/2019 0442   PCO2ART 91.2 (HH) 10/08/2019 0442   PO2ART 90 10/08/2019 0442   HCO3 30.5 (H) 10/08/2019 0442   TCO2 33 (H) 10/08/2019 0442   O2SAT 93.0 10/08/2019 0442     Coagulation Profile: No results for input(s): INR, PROTIME in the last 168 hours.  Cardiac Enzymes: No results for input(s): CKTOTAL, CKMB, CKMBINDEX, TROPONINI in the last 168 hours.  HbA1C: Hgb A1c MFr Bld  Date/Time Value Ref Range Status  09/28/2019 06:20 AM 5.5 4.8 - 5.6 % Final    Comment:    (NOTE) Pre diabetes:          5.7%-6.4%  Diabetes:              >6.4%  Glycemic control for   <7.0% adults with diabetes     CBG: Recent Labs  Lab 10/09/19 1627 10/09/19 2023 10/10/19 0037 10/10/19 0419 10/10/19 0721  GLUCAP 143* 163* 138* [place] MSN, AGACNP-BC [place] [phone] If no answer, [phone] 10/10/2019, 9:56 AM

## 2019-10-10 NOTE — Progress Notes (Addendum)
Renal Navigator notes plans for discharge today. Navigator called [facility], since patient has not yet signed her paperwork in order to start in the clinic tomorrow. [facility] staff state patient does not need to rush here at discharge today. She can arrive at 12:15pm tomorrow and sign the necessary consent for treatment and complete the rest of her intake paperwork on Tuesday. Navigator notified patient and bedside RN. Patient appreciative. Renal PA aware to send OP HD orders to clinic.  Alphonzo Cruise, [place] Renal Navigator [phone]

## 2019-10-10 NOTE — Progress Notes (Addendum)
Subjective:  Feelas better , tolerated hd yest, feels ok for dc home , PT in to see now    Objective Vital signs in last 24 hours: Vitals:   10/09/19 2000 10/10/19 0000 10/10/19 0422 10/10/19 0718  BP: (!) 130/55 (!) 130/56 (!) 139/51 (!) 128/52  Pulse: 85 83 81 83  Resp: 22 22 18 23   Temp: 99.8 F (37.7 C) 98.5 F (36.9 C) 98.6 F (37 C) 98.4 F (36.9 C)  TempSrc: Oral Oral Oral Oral  SpO2: 96% 95% 96% 96%  Weight:      Height:       Weight change:   Physical Exam: General: obese, alert NAD . Pleasant  Heart: RRR, no rub Lungs: sl deceased bs bases  Otherwise  CTA  Abdomen:  Bs +, soft , nontender Extremities: trace bipedal edema Dialysis Access: RIJ TDC c/d/i, RUA AVF  Pos bruit, thrill   Dialysis Orders:  [facility] - 1st outpatient tmt was scheduled for 7/22  Problem/Plan: 1.  Acute hypercarbic respiratory failure. Improved with BiPAP support, not currently requiring. Per primary team -to f/u for sleep study as outpatient 2. ESRD -HD TTS. HD started 10/03/19. Using [facility]. RUE BCFplaced 7/20. Continue HD on schedule. Next HD 7/24.  3. Hypertension/volume - On amlodipine 10, carvedilol 25 bid. Establishing dry weight. Does have volume on exam.told hold bp meds pre hd as op , establishing edw with vol tapering down , UF3Lnext HD OP  [facility]  Will send orders    4. Anemia - HGB 7.3 pre last hd , Holding ESA2/2 possible RCC. Tsat40%. , told dc po iron , will give at center when needed 5. Metabolic bone disease -No binders currently. Follow Ca/Phos.  pth 355 7/16  Fu op trend  No current vit d  6. R Renal mass- followed by urology as outpatient at [facility] (was seeing for kid tx wu )  Continue fu as OP @ [facility]  , may  likely to req nephrectomy  NEEDS TO GO TO OP [place] 500 PM to [place] for op HD  [place].  Pt told    Ernest Haber, PA-C [facility] Beeper [phone] 10/10/2019,9:50 AM  LOS: 2 days   Labs: Basic Metabolic  Panel: Recent Labs  Lab 10/05/19 0540 10/05/19 0540 10/06/19 0318 10/06/19 0318 10/07/19 0223 10/08/19 0259 10/08/19 0415 10/08/19 0442 10/08/19 0801 10/09/19 0501  NA 139  139   < > 139   < > 140   < > 139 141  --  142  K 3.2*  3.1*   < > 3.8   < > 3.5   < > 4.2 4.0  --  3.9  CL 104  104   < > 107   < > 106  --  105  --   --  108  CO2 26  26   < > 23   < > 29  --  26  --   --  23  GLUCOSE 109*  109*   < > 133*   < > 131*  --  195*  --   --  160*  BUN 18  19   < > 25*   < > 14  --  21  --   --  28*  CREATININE 3.98*  3.93*   < > 5.68*   < > 4.46*  --  5.82*  --  5.91* 7.03*  CALCIUM 8.3*  8.2*   < >  9.3   < > 9.3  --  9.7  --   --  9.4  PHOS 3.6  --  6.5*  --  5.4*  --   --   --   --   --    < > = values in this interval not displayed.   Liver Function Tests: Recent Labs  Lab 10/05/19 0540 10/06/19 0318 10/07/19 0223 10/08/19 0415 10/09/19 0501  AST 16  --   --  26 25  ALT 12  --   --  10 6  ALKPHOS 55  --   --  49 61  BILITOT 0.4  --   --  0.3 0.6  PROT 5.1*  --   --  4.9* 4.8*  ALBUMIN 2.0*  1.9*   < > 1.8* 1.9* 1.8*   < > = values in this interval not displayed.   No results for input(s): LIPASE, AMYLASE in the last 168 hours. No results for input(s): AMMONIA in the last 168 hours. CBC: Recent Labs  Lab 10/04/19 0449 10/04/19 0449 10/05/19 0540 10/05/19 0540 10/07/19 0223 10/07/19 1833 10/08/19 0415 10/08/19 0442 10/09/19 0501  WBC 6.4   < > 6.3   < > 6.5  --  10.1  --  7.8  NEUTROABS 4.7  --  4.2  --   --   --  9.0*  --   --   HGB 7.2*   < > 7.3*   < > 6.8*   < > 7.7* 9.9* 7.3*  HCT 25.0*   < > 25.4*   < > 24.7*   < > 28.1* 29.0* 26.2*  MCV 93.6  --  93.4  --  96.9  --  100.0  --  98.5  PLT 119*   < > 122*   < > 106*  --  116*  --  103*   < > = values in this interval not displayed.   Cardiac Enzymes: No results for input(s): CKTOTAL, CKMB, CKMBINDEX, TROPONINI in the last 168 hours. CBG: Recent Labs  Lab 10/09/19 1627 10/09/19 2023  10/10/19 0037 10/10/19 0419 10/10/19 0721  GLUCAP 143* 163* 138* 111* 96    Studies/Results: ECHOCARDIOGRAM COMPLETE  Result Date: 10/08/2019    ECHOCARDIOGRAM REPORT   Patient Name:   Kristen English Date of Exam: 10/08/2019 Medical Rec #:  [id number]        Height:       68.0 in Accession #:    [id number]       Weight:       289.0 lb Date of Birth:  03/02/1956       BSA:          2.390 m Patient Age:    64 years         BP:           150/57 mmHg Patient Gender: F                HR:           62 bpm. Exam Location:  Inpatient Procedure: 2D Echo and Intracardiac Opacification Agent Indications:    Syncope 780.2 / R55  History:        Patient has no prior history of Echocardiogram examinations.                 Risk Factors:Hypertension and Diabetes. Chronic kidney disease,                 GOUT,  thyroid disease.  Sonographer:    Darlina Sicilian RDCS Referring Phys: [id number] [place]  1. Left ventricular ejection fraction, by estimation, is 60 to 65%. The left ventricle has normal function. The left ventricle has no regional wall motion abnormalities. Left ventricular diastolic parameters are consistent with Grade I diastolic dysfunction (impaired relaxation).  2. Right ventricular systolic function is normal. The right ventricular size is normal. Tricuspid regurgitation signal is inadequate for assessing PA pressure.  3. Left atrial size was mildly dilated.  4. The mitral valve is normal in structure. Trivial mitral valve regurgitation. No evidence of mitral stenosis.  5. The aortic valve is tricuspid. Aortic valve regurgitation is not visualized. Mild aortic valve sclerosis is present, with no evidence of aortic valve stenosis.  6. The inferior vena cava is dilated in size with <50% respiratory variability, suggesting right atrial pressure of 15 mmHg.  7. There is a small to moderate predominantly lateral pericardial effusion, no tamponade. FINDINGS  Left Ventricle: Left ventricular ejection  fraction, by estimation, is 60 to 65%. The left ventricle has normal function. The left ventricle has no regional wall motion abnormalities. Definity contrast agent was given IV to delineate the left ventricular  endocardial borders. The left ventricular internal cavity size was normal in size. There is no left ventricular hypertrophy. Left ventricular diastolic parameters are consistent with Grade I diastolic dysfunction (impaired relaxation). Right Ventricle: The right ventricular size is normal. No increase in right ventricular wall thickness. Right ventricular systolic function is normal. Tricuspid regurgitation signal is inadequate for assessing PA pressure. Left Atrium: Left atrial size was mildly dilated. Right Atrium: Right atrial size was normal in size. Pericardium: There is a small to moderate predominantly lateral pericardial effusion, no tamponade. A small pericardial effusion is present. Mitral Valve: The mitral valve is normal in structure. Mild mitral annular calcification. Trivial mitral valve regurgitation. No evidence of mitral valve stenosis. Tricuspid Valve: The tricuspid valve is normal in structure. Tricuspid valve regurgitation is trivial. Aortic Valve: The aortic valve is tricuspid. Aortic valve regurgitation is not visualized. Mild aortic valve sclerosis is present, with no evidence of aortic valve stenosis. Pulmonic Valve: The pulmonic valve was normal in structure. Pulmonic valve regurgitation is trivial. Aorta: The aortic root is normal in size and structure. Venous: The inferior vena cava is dilated in size with less than 50% respiratory variability, suggesting right atrial pressure of 15 mmHg. IAS/Shunts: No atrial level shunt detected by color flow Doppler.  LEFT VENTRICLE PLAX 2D LVIDd:         5.10 cm      Diastology LVIDs:         3.30 cm      LV e' lateral:   6.96 cm/s LV PW:         1.00 cm      LV E/e' lateral: 15.1 LV IVS:        1.00 cm      LV e' medial:    3.81 cm/s LVOT  diam:     1.90 cm      LV E/e' medial:  27.6 LV SV:         89 LV SV Index:   37 LVOT Area:     2.84 cm  LV Volumes (MOD) LV vol d, MOD A2C: 156.0 ml LV vol d, MOD A4C: 127.0 ml LV vol s, MOD A2C: 47.6 ml LV vol s, MOD A4C: 42.8 ml LV SV MOD A2C:     108.4  ml LV SV MOD A4C:     127.0 ml LV SV MOD BP:      97.2 ml LEFT ATRIUM             Index LA diam:        3.70 cm 1.55 cm/m LA Vol (A2C):   78.7 ml 32.93 ml/m LA Vol (A4C):   85.9 ml 35.94 ml/m LA Biplane Vol: 90.6 ml 37.91 ml/m  AORTIC VALVE LVOT Vmax:   135.00 cm/s LVOT Vmean:  89.400 cm/s LVOT VTI:    0.313 m  AORTA Ao Root diam: 2.90 cm MITRAL VALVE MV Area (PHT): 3.08 cm     SHUNTS MV Decel Time: 246 msec     Systemic VTI:  0.31 m MV E velocity: 105.00 cm/s  Systemic Diam: 1.90 cm MV A velocity: 111.00 cm/s MV E/A ratio:  0.95 Loralie Champagne MD Electronically signed by Loralie Champagne MD Signature Date/Time: 10/08/2019/4:18:50 PM    Final    VAS [place] LOWER EXTREMITY VENOUS (DVT)  Result Date: 10/08/2019  Lower Venous DVTStudy Indications: Pain, and Swelling.  Limitations: Body habitus and depth of vessels, and patient pain/intolerance to compression. Comparison Study: No prior studies. Performing Technologist: Darlin Coco  Examination Guidelines: A complete evaluation includes B-mode imaging, spectral Doppler, color Doppler, and power Doppler as needed of all accessible portions of each vessel. Bilateral testing is considered an integral part of a complete examination. Limited examinations for reoccurring indications may be performed as noted. The reflux portion of the exam is performed with the patient in reverse Trendelenburg.  +---------+---------------+---------+-----------+----------+--------------+ RIGHT    CompressibilityPhasicitySpontaneityPropertiesThrombus Aging +---------+---------------+---------+-----------+----------+--------------+ CFV      Full           Yes      Yes                                  +---------+---------------+---------+-----------+----------+--------------+ SFJ      Full                                                        +---------+---------------+---------+-----------+----------+--------------+ FV Prox  Full                    Yes                                 +---------+---------------+---------+-----------+----------+--------------+ FV Mid                  Yes      Yes                                 +---------+---------------+---------+-----------+----------+--------------+ FV Distal               Yes      Yes                                 +---------+---------------+---------+-----------+----------+--------------+ PFV                     Yes      Yes                                 +---------+---------------+---------+-----------+----------+--------------+  POP      Full           Yes      Yes                                 +---------+---------------+---------+-----------+----------+--------------+ PTV      Full                                                        +---------+---------------+---------+-----------+----------+--------------+ PERO     Full                                                        +---------+---------------+---------+-----------+----------+--------------+   +---------+---------------+---------+-----------+----------+-----------------+ LEFT     CompressibilityPhasicitySpontaneityPropertiesThrombus Aging    +---------+---------------+---------+-----------+----------+-----------------+ CFV      Full           Yes      Yes                                    +---------+---------------+---------+-----------+----------+-----------------+ SFJ      Full                                                           +---------+---------------+---------+-----------+----------+-----------------+ FV Prox  Full                                                            +---------+---------------+---------+-----------+----------+-----------------+ FV Mid                  Yes      Yes                                    +---------+---------------+---------+-----------+----------+-----------------+ FV Distal               Yes      Yes                                    +---------+---------------+---------+-----------+----------+-----------------+ PFV      Full                                                           +---------+---------------+---------+-----------+----------+-----------------+ POP      Full           Yes      Yes                                    +---------+---------------+---------+-----------+----------+-----------------+  PTV      Partial                                      Age Indeterminate +---------+---------------+---------+-----------+----------+-----------------+ PERO                    Yes      Yes                                    +---------+---------------+---------+-----------+----------+-----------------+     Summary: RIGHT: - There is no evidence of deep vein thrombosis in the lower extremity. However, portions of this examination were limited- see technologist comments above.  - No cystic structure found in the popliteal fossa.  LEFT: - Findings consistent with age indeterminate deep vein thrombosis involving the left posterior tibial veins. - Portions of this examination were limited- see technologist comments above. - No cystic structure found in the popliteal fossa.  *See table(s) above for measurements and observations. Electronically signed by Ruta Hinds MD on 10/08/2019 at 6:14:25 PM.    Final    Medications:  . amLODipine  10 mg Oral Daily  . aspirin EC  81 mg Oral Daily  . atorvastatin  40 mg Oral Daily  . carvedilol  25 mg Oral BID WC  . Chlorhexidine Gluconate Cloth  6 each Topical Daily  . doxazosin  2 mg Oral QHS  . ferrous sulfate  325 mg Oral Q breakfast  . heparin  5,000 Units  Subcutaneous Q8H  . levothyroxine  25 mcg Oral Daily

## 2019-10-10 NOTE — Evaluation (Signed)
Physical Therapy Evaluation & Discharge Patient Details Name: Kristen English MRN: [id number] DOB: December 14, 1955 Today's Date: 10/10/2019   History of Present Illness  Pt is a 64 y.o. female with PMH of DM, HTN, recent hospitalization with dx of ESRD, s/p new dialysis fistula, started on HD 10/03/19 (TTS schedule); pt hospitalized 7/15-7/20/21. Two hours after returning home, son found her unconscious on the floor and brought in by EMS. ABG PCO2 91 and observed by respiratory to have apneic periods while on BiPAP. Workup for likely OSA/OHS.   Clinical Impression  Patient evaluated by Physical Therapy with no further acute PT needs identified. PTA, pt independent with intermittent use of SPC, lives alone; son plans to stay with pt a few days to provide initial assist. Today, pt moving well at supervision-level; limited by fatigue and generalized weakness. SpO2 89-92% on RA with activity. All education has been completed and the patient has no further questions. Discussed DME needs with potential fatigue and weakness associated with HD tx; pt declined gait training with RW. Acute PT is signing off. Thank you for this referral.    Follow Up Recommendations No PT follow up;Supervision - Intermittent    Equipment Recommendations  None recommended by PT    Recommendations for Other Services       Precautions / Restrictions Precautions Precautions: Fall Restrictions Weight Bearing Restrictions: No      Mobility  Bed Mobility Overal bed mobility: Modified Independent             General bed mobility comments: HOB elevated.  Transfers Overall transfer level: Needs assistance Equipment used: None Transfers: Sit to/from Stand Sit to Stand: Supervision;From elevated surface         General transfer comment: Able to stand from EOB with slight raised height; standing from low recliner with heavy reliance on BUE armrest support; reports baseline difficulty standing from low  surfaces  Ambulation/Gait Ambulation/Gait assistance: Supervision Gait Distance (Feet): 120 Feet Assistive device: None Gait Pattern/deviations: Step-through pattern;Decreased stride length;Wide base of support Gait velocity: Decreased   General Gait Details: Slow, steady gait without DME, reports baseline R thigh/hip pain, declined trial with RW. SpO2 >/89% on RA  Stairs            Wheelchair Mobility    Modified Rankin (Stroke Patients Only)       Balance Overall balance assessment: Mild deficits observed, not formally tested                                           Pertinent Vitals/Pain Pain Assessment: Faces Faces Pain Scale: Hurts a little bit Pain Location: R hip Pain Descriptors / Indicators: Discomfort;Sore Pain Intervention(s): Monitored during session    Home Living Family/patient expects to be discharged to:: Private residence Living Arrangements: Alone Available Help at Discharge: Family;Available 24 hours/day Type of Home: House Home Access: Stairs to enter Entrance Stairs-Rails: None Entrance Stairs-Number of Steps: 3 Home Layout: One level Home Equipment: Cane - single point;Bedside commode;Hand held shower head;Grab bars - tub/shower Additional Comments: Son staying to provide assist until next week    Prior Function Level of Independence: Independent with assistive device(s)   Gait / Transfers Assistance Needed: Uses SPC PRN  ADL's / Homemaking Assistance Needed: Independent with ADLs        Hand Dominance   Dominant Hand: Right    Extremity/Trunk Assessment   Upper  Extremity Assessment Upper Extremity Assessment: Generalized weakness    Lower Extremity Assessment Lower Extremity Assessment: Generalized weakness RLE Deficits / Details: soreness in right quad per report, has been dealing with this for a few months.       Communication   Communication: No difficulties  Cognition Arousal/Alertness:  Awake/alert Behavior During Therapy: WFL for tasks assessed/performed Overall Cognitive Status: Within Functional Limits for tasks assessed                                        General Comments      Exercises     Assessment/Plan    PT Assessment Patent does not need any further PT services  PT Problem List Decreased strength;Decreased mobility;Pain;Decreased balance;Decreased activity tolerance       PT Treatment Interventions      PT Goals (Current goals can be found in the Care Plan section)  Acute Rehab PT Goals Patient Stated Goal: Home today PT Goal Formulation: All assessment and education complete, DC therapy    Frequency     Barriers to discharge        Co-evaluation               AM-PAC PT "6 Clicks" Mobility  Outcome Measure Help needed turning from your back to your side while in a flat bed without using bedrails?: None Help needed moving from lying on your back to sitting on the side of a flat bed without using bedrails?: None Help needed moving to and from a bed to a chair (including a wheelchair)?: None Help needed standing up from a chair using your arms (e.g., wheelchair or bedside chair)?: None Help needed to walk in hospital room?: None Help needed climbing 3-5 steps with a railing? : A Little 6 Click Score: 23    End of Session   Activity Tolerance: Patient tolerated treatment well Patient left: in chair;with call bell/phone within reach Nurse Communication: Mobility status PT Visit Diagnosis: Other abnormalities of gait and mobility (R26.89)    Time: 0051-1021 PT Time Calculation (min) (ACUTE ONLY): 14 min   Charges:   PT Evaluation $PT Eval Low Complexity: [place], PT, DPT Acute Rehabilitation Services  Pager [phone] Office [place] 10/10/2019, 10:54 AM

## 2019-10-10 NOTE — Progress Notes (Signed)
SATURATION QUALIFICATIONS: (This note is used to comply with regulatory documentation for home oxygen)  Patient Saturations on Room Air at Rest = 91%  Patient Saturations on Room Air while Ambulating = 87%  Patient Saturations on 2 Liters of oxygen while Ambulating = 93%  Please briefly explain why patient needs home oxygen: Pt needs home O2 in order to maintain O2 sats above 88%

## 2019-10-10 NOTE — Consult Note (Addendum)
   [facility] Inpatient Consult   10/10/2019  Kristen English 17-Aug-1955 [id number]   [facility] [ACO] Patient: [name]  Patient screened for referral from [facility], inpatient Transition of Care, for new HF with oxygen for possible Remote Health follow up and for hospitalizations to check if potential [place] Management service needs.  Review of patient's medical record reveals patient is. currently for home, has oxygen ordered for home use.  Patient is a readmission noted in less than 7 days.  Primary Care Provider is listed as Wenda Low, MD [facility]  this provider is listed to provide the transition of care [TOC] for post hospital follow up.  *Noted that patient is ESRD with HD on TTS schedule. 1650:  Spoke with the patient regarding referral and states she would mind nursing follow up. Explained [facility] Management program as a benefit.   Plan: Follow up with inpatient [facility] RNCM with information for the referral request was sent. Will have [facility] Management to follow up. Please place a [facility] Care Management consult as appropriate and for questions contact:   Natividad Brood, RN BSN [facility] Liaison  [phone] business mobile phone Toll free office [phone]  Fax number: [phone] [email or web address]

## 2019-10-10 NOTE — Discharge Summary (Addendum)
Physician Discharge Summary  Kristen English DSK:[id number] DOB: [birth year] DOA: 10/08/2019  PCP: Wenda Low, MD  Admit date: 10/08/2019 Discharge date: 10/10/2019  Admitted From: Home Disposition:  Discharged to home.  Recommendations for Outpatient Follow-up:  1. Follow up with PCP in 1 weeks 2. Go to HD center today to sign paperwork so that HD can start tomorrow.  Discharge Condition: Stable  CODE STATUS: FULL   Brief/Interim Summary: [name] a64 y.o.female,past medical history of diabetes mellitus, hypertension, recent hospitalization where she was diagnosed with ESRD, status post new dialysis fistula, which she was started on hemodialysis, on TTS schedule, patient was discharged home yesterday evening, apparently she was at home for 4 hours, her son put her in bed to sleep, and then he comes back to see her, he found her unresponsive on the floor,patient had loss of consciousness, remembers waking up son trying to wake her up, thinks she might fell out of the bed, she does not recall if she hit her head, EMS report they had to do sternal rub for her to wake up, initial GCS was 12, CBG was 244, she denies any chest pain, shortness of breath, fever, chills, nausea or vomiting there was no seizure activity, urinary or stool incontinence.  7/23: She desats at night. Needs OSA study outpt. Recommend she go home on 2L [place] at night. Needs to follow up at her dialysis center today to fill out paperwork so that she can start dialysis tomorrow.   Discharge Diagnoses:  Active Problems:   Essential hypertension   Normocytic anemia   Acute respiratory failure with hypercapnia (HCC)  Acute respiratory failure with hypercapnia Acute diastolic HF (can not determine if this is chronic) - This is most likely related to underlying OHS/OSA, she never had sleep study in the past, her ABG showing pH of 7.1, PCO2 of 91 - for now we will continue with BiPAP, even with that  sometimes patient becomes apneic and respiratory distress as discussed with RT, especially if she goes to sleep, otherwise when she is awake she appears to be comfortable. - PCCM has eval'd; appreciate assistance - Echo: G1DD;  - 7/23: She is now weaned to RA; but she desats to 70's periodically at night. She needs a sleep study test outpt. Recommend she go home with 2L [place] continuous.      - volume control w/ HD     - follow up with PCP in 1 week  End-stage renal disease - she was started hemodialysis on recent admission, will have morning team to consult renal to resume dialysis on TTS schedule. - s/p rue brachiocephalic avf 09/06/3557 [facility].      - Outpt HD tomorrow per nephrology; deferred EPO/Iron/transfusion to outpt HD  Diabetes mellitus type 2 with recent episodes of hypoglycemia - Last A1c 5.4.     - DM diet, follow  Anemia of chronic disease - Patient did require PRBC transfusion during recent hospital stay, hemoglobin is 7.7 on admission, which is around her baseline.     - Hgb is 7.3. No evidence of bleed. Follow up outpt.  Essential hypertension - Continue amlodipine, Coreg, doxazosin.     - BP acceptable  Hypothyroidism - continue levothyroxine  Morbid obesity - Outpatient follow-up  Right renal mass     - continue follow up with [facility] urology  Discharge Instructions   Allergies as of 10/10/2019      Reactions   Morphine And Related Itching      Medication  List    STOP taking these medications   FerrouSul 325 (65 FE) MG tablet Generic drug: ferrous sulfate     TAKE these medications   amLODipine 10 MG tablet Commonly known as: NORVASC Take 10 mg by mouth daily.   aspirin 81 MG tablet Take 81 mg by mouth daily.   atorvastatin 40 MG tablet Commonly known as: LIPITOR Take 40 mg by mouth daily.   carvedilol 25 MG tablet Commonly known as: COREG Take 25 mg by mouth 2 (two) times daily with  a meal.   colchicine 0.6 MG tablet Take 1 tablet (0.6 mg total) by mouth as needed (for gout flare. Don't repeat dose for another 2 weeks.).   doxazosin 2 MG tablet Commonly known as: CARDURA Take 2 mg by mouth at bedtime.   levothyroxine 25 MCG tablet Commonly known as: SYNTHROID Take 25 mcg by mouth daily.   oxyCODONE 5 MG immediate release tablet Commonly known as: Roxicodone Take 1 tablet (5 mg total) by mouth every 4 (four) hours as needed. What changed: reasons to take this            Durable Medical Equipment  (From admission, onward)         Start     Ordered   10/10/19 1301  For home use only DME oxygen  Once       Question Answer Comment  Length of Need 6 Months   Mode or (Route) Nasal cannula   Liters per Minute 2   Frequency Only at night (stationary unit needed)   Oxygen conserving device Yes   Oxygen delivery system Gas      10/10/19 1301          Allergies  Allergen Reactions  . Morphine And Related Itching    Consultations:  Nephrology  PCCM  Procedures/Studies: CT Head Wo Contrast  Result Date: 10/08/2019 CLINICAL DATA:  Syncope EXAM: CT HEAD WITHOUT CONTRAST TECHNIQUE: Contiguous axial images were obtained from the base of the skull through the vertex without intravenous contrast. COMPARISON:  None. FINDINGS: Brain: No acute infarct or hemorrhage. Lateral ventricles and midline structures are unremarkable. No acute extra-axial fluid collections. No mass effect. Vascular: No hyperdense vessel or unexpected calcification. Skull: Normal. Negative for fracture or focal lesion. Sinuses/Orbits: No acute finding. Other: None. IMPRESSION: 1. No acute intracranial process. Electronically Signed   By: Randa Ngo M.D.   On: 10/08/2019 01:08   CT Cervical Spine Wo Contrast  Result Date: 10/08/2019 CLINICAL DATA:  Syncope EXAM: CT CERVICAL SPINE WITHOUT CONTRAST TECHNIQUE: Multidetector CT imaging of the cervical spine was performed without  intravenous contrast. Multiplanar CT image reconstructions were also generated. COMPARISON:  None. FINDINGS: Alignment: There is reversal of the normal cervical lordosis. Skull base and vertebrae: Visualized skull base is intact. No atlanto-occipital dissociation. The vertebral body heights are well maintained. No fracture or pathologic osseous lesion seen. Soft tissues and spinal canal: The visualized paraspinal soft tissues are unremarkable. No prevertebral soft tissue swelling is seen. The spinal canal is grossly unremarkable, no large epidural collection or significant canal narrowing. Disc levels: Disc height loss with anterior osteophytes disc osteophyte complex and uncovertebral osteophytes is most notable at C4-C5 and C5-C6 with moderate neural foraminal narrowing and mild effacement of the anterior thecal sac. Upper chest: The lung apices are clear. Thoracic inlet is within normal limits. Scattered aortic atherosclerosis is seen. Other: None IMPRESSION: No acute fracture or malalignment of the spine. Aortic Atherosclerosis (ICD10-I70.0). Electronically Signed  By: Prudencio Pair M.D.   On: 10/08/2019 01:21   IR Fluoro Guide CV Line Right  Result Date: 10/03/2019 INDICATION: 64 year old female with renal failure referred for hemodialysis catheter placement EXAM: IMAGE GUIDED TUNNELED HEMODIALYSIS CATHETER PLACEMENT MEDICATIONS: 2 g Ancef; The antibiotic was administered within an appropriate time interval prior to skin puncture. ANESTHESIA/SEDATION: Moderate (conscious) sedation was employed during this procedure. A total of Versed 0 mg and Fentanyl 25 mcg was administered intravenously. Moderate Sedation Time: 0 minutes. The patient's level of consciousness and vital signs were monitored continuously by radiology nursing throughout the procedure under my direct supervision. FLUOROSCOPY TIME:  Fluoroscopy Time: 0 minutes 36 seconds (5 mGy). COMPLICATIONS: None PROCEDURE: Informed written consent was  obtained from the patient after a discussion of the risks, benefits, and alternatives to treatment. Questions regarding the procedure were encouraged and answered. The right neck and chest were prepped with chlorhexidine in a sterile fashion, and a sterile drape was applied covering the operative field. Maximum barrier sterile technique with sterile gowns and gloves were used for the procedure. A timeout was performed prior to the initiation of the procedure. Ultrasound survey was performed. Micropuncture kit was utilized to access the right internal jugular vein under direct, real-time ultrasound guidance after the overlying soft tissues were anesthetized with 1% lidocaine with epinephrine. Stab incision was made with 11 blade scalpel. Microwire was passed centrally. The microwire was then marked to measure appropriate internal catheter length. External tunneled length was estimated. A total tip to cuff length of 19 cm was selected. 035 guidewire was advanced to the level of the IVC. Skin and subcutaneous tissues of chest wall below the clavicle were generously infiltrated with 1% lidocaine for local anesthesia. A small stab incision was made with 11 blade scalpel. The selected hemodialysis catheter was tunneled in a retrograde fashion from the anterior chest wall to the venotomy incision. Serial dilation was performed and then a peel-away sheath was placed. The catheter was then placed through the peel-away sheath with tips ultimately positioned within the superior aspect of the right atrium. Final catheter positioning was confirmed and documented with a spot radiographic image. The catheter aspirates and flushes normally. The catheter was flushed with appropriate volume heparin dwells. The catheter exit site was secured with a 0-Prolene retention suture. Gel-Foam slurry was infused into the soft tissue tract. The venotomy incision was closed Derma bond and sterile dressing. Dressings were applied at the chest  wall. Patient tolerated the procedure well and remained hemodynamically stable throughout. No complications were encountered and no significant blood loss encountered. IMPRESSION: Status post placement of right IJ tunneled hemodialysis catheter. Signed, Dulcy Fanny. Dellia Nims, RPVI [facility] Radiology Electronically Signed   By: Corrie Mckusick D.O.   On: 10/03/2019 15:45   IR US Guide Vasc Access Right  Result Date: 10/03/2019 INDICATION: 64 year old female with renal failure referred for hemodialysis catheter placement EXAM: IMAGE GUIDED TUNNELED HEMODIALYSIS CATHETER PLACEMENT MEDICATIONS: 2 g Ancef; The antibiotic was administered within an appropriate time interval prior to skin puncture. ANESTHESIA/SEDATION: Moderate (conscious) sedation was employed during this procedure. A total of Versed 0 mg and Fentanyl 25 mcg was administered intravenously. Moderate Sedation Time: 0 minutes. The patient's level of consciousness and vital signs were monitored continuously by radiology nursing throughout the procedure under my direct supervision. FLUOROSCOPY TIME:  Fluoroscopy Time: 0 minutes 36 seconds (5 mGy). COMPLICATIONS: None PROCEDURE: Informed written consent was obtained from the patient after a discussion of the  risks, benefits, and alternatives to treatment. Questions regarding the procedure were encouraged and answered. The right neck and chest were prepped with chlorhexidine in a sterile fashion, and a sterile drape was applied covering the operative field. Maximum barrier sterile technique with sterile gowns and gloves were used for the procedure. A timeout was performed prior to the initiation of the procedure. Ultrasound survey was performed. Micropuncture kit was utilized to access the right internal jugular vein under direct, real-time ultrasound guidance after the overlying soft tissues were anesthetized with 1% lidocaine with epinephrine. Stab incision  was made with 11 blade scalpel. Microwire was passed centrally. The microwire was then marked to measure appropriate internal catheter length. External tunneled length was estimated. A total tip to cuff length of 19 cm was selected. 035 guidewire was advanced to the level of the IVC. Skin and subcutaneous tissues of chest wall below the clavicle were generously infiltrated with 1% lidocaine for local anesthesia. A small stab incision was made with 11 blade scalpel. The selected hemodialysis catheter was tunneled in a retrograde fashion from the anterior chest wall to the venotomy incision. Serial dilation was performed and then a peel-away sheath was placed. The catheter was then placed through the peel-away sheath with tips ultimately positioned within the superior aspect of the right atrium. Final catheter positioning was confirmed and documented with a spot radiographic image. The catheter aspirates and flushes normally. The catheter was flushed with appropriate volume heparin dwells. The catheter exit site was secured with a 0-Prolene retention suture. Gel-Foam slurry was infused into the soft tissue tract. The venotomy incision was closed Derma bond and sterile dressing. Dressings were applied at the chest wall. Patient tolerated the procedure well and remained hemodynamically stable throughout. No complications were encountered and no significant blood loss encountered. IMPRESSION: Status post placement of right IJ tunneled hemodialysis catheter. Signed, Dulcy Fanny. Dellia Nims, RPVI [facility] Radiology Electronically Signed   By: Corrie Mckusick D.O.   On: 10/03/2019 15:45   DG Chest Portable 1 View  Result Date: 10/08/2019 CLINICAL DATA:  Cough EXAM: PORTABLE CHEST 1 VIEW COMPARISON:  March 26, 2007 FINDINGS: There is mild cardiomegaly. A right-sided central venous catheter seen with the tip at the superior cavoatrial junction. There is mild prominence of the  central pulmonary vasculature. There is patchy airspace opacity seen within the retrocardiac region. The visualized skeletal structures are unremarkable. IMPRESSION: cardiomegaly and mild pulmonary vascular congestion. Patchy airspace opacity in the retrocardiac region which could be due to atelectasis and/or early infectious etiology Electronically Signed   By: Prudencio Pair M.D.   On: 10/08/2019 01:03   ECHOCARDIOGRAM COMPLETE  Result Date: 10/08/2019    ECHOCARDIOGRAM REPORT   Patient Name:   Kristen English Straley Date of Exam: 10/08/2019 Medical Rec #:  [id number]        Height:       68.0 in Accession #:    [id number]       Weight:       289.0 lb Date of Birth:  28-Dec-1955       BSA:          2.390 m Patient Age:    66 years         BP:           150/57 mmHg Patient Gender: F                HR:  62 bpm. Exam Location:  Inpatient Procedure: 2D Echo and Intracardiac Opacification Agent Indications:    Syncope 780.2 / R55  History:        Patient has no prior history of Echocardiogram examinations.                 Risk Factors:Hypertension and Diabetes. Chronic kidney disease,                 GOUT, thyroid disease.  Sonographer:    Darlina Sicilian RDCS Referring Phys: [id number] [place]  1. Left ventricular ejection fraction, by estimation, is 60 to 65%. The left ventricle has normal function. The left ventricle has no regional wall motion abnormalities. Left ventricular diastolic parameters are consistent with Grade I diastolic dysfunction (impaired relaxation).  2. Right ventricular systolic function is normal. The right ventricular size is normal. Tricuspid regurgitation signal is inadequate for assessing PA pressure.  3. Left atrial size was mildly dilated.  4. The mitral valve is normal in structure. Trivial mitral valve regurgitation. No evidence of mitral stenosis.  5. The aortic valve is tricuspid. Aortic valve regurgitation is not visualized. Mild aortic valve sclerosis is present,  with no evidence of aortic valve stenosis.  6. The inferior vena cava is dilated in size with <50% respiratory variability, suggesting right atrial pressure of 15 mmHg.  7. There is a small to moderate predominantly lateral pericardial effusion, no tamponade. FINDINGS  Left Ventricle: Left ventricular ejection fraction, by estimation, is 60 to 65%. The left ventricle has normal function. The left ventricle has no regional wall motion abnormalities. Definity contrast agent was given IV to delineate the left ventricular  endocardial borders. The left ventricular internal cavity size was normal in size. There is no left ventricular hypertrophy. Left ventricular diastolic parameters are consistent with Grade I diastolic dysfunction (impaired relaxation). Right Ventricle: The right ventricular size is normal. No increase in right ventricular wall thickness. Right ventricular systolic function is normal. Tricuspid regurgitation signal is inadequate for assessing PA pressure. Left Atrium: Left atrial size was mildly dilated. Right Atrium: Right atrial size was normal in size. Pericardium: There is a small to moderate predominantly lateral pericardial effusion, no tamponade. A small pericardial effusion is present. Mitral Valve: The mitral valve is normal in structure. Mild mitral annular calcification. Trivial mitral valve regurgitation. No evidence of mitral valve stenosis. Tricuspid Valve: The tricuspid valve is normal in structure. Tricuspid valve regurgitation is trivial. Aortic Valve: The aortic valve is tricuspid. Aortic valve regurgitation is not visualized. Mild aortic valve sclerosis is present, with no evidence of aortic valve stenosis. Pulmonic Valve: The pulmonic valve was normal in structure. Pulmonic valve regurgitation is trivial. Aorta: The aortic root is normal in size and structure. Venous: The inferior vena cava is dilated in size with less than 50% respiratory variability, suggesting right atrial  pressure of 15 mmHg. IAS/Shunts: No atrial level shunt detected by color flow Doppler.  LEFT VENTRICLE PLAX 2D LVIDd:         5.10 cm      Diastology LVIDs:         3.30 cm      LV e' lateral:   6.96 cm/s LV PW:         1.00 cm      LV E/e' lateral: 15.1 LV IVS:        1.00 cm      LV e' medial:    3.81 cm/s LVOT diam:  1.90 cm      LV E/e' medial:  27.6 LV SV:         89 LV SV Index:   37 LVOT Area:     2.84 cm  LV Volumes (MOD) LV vol d, MOD A2C: 156.0 ml LV vol d, MOD A4C: 127.0 ml LV vol s, MOD A2C: 47.6 ml LV vol s, MOD A4C: 42.8 ml LV SV MOD A2C:     108.4 ml LV SV MOD A4C:     127.0 ml LV SV MOD BP:      97.2 ml LEFT ATRIUM             Index LA diam:        3.70 cm 1.55 cm/m LA Vol (A2C):   78.7 ml 32.93 ml/m LA Vol (A4C):   85.9 ml 35.94 ml/m LA Biplane Vol: 90.6 ml 37.91 ml/m  AORTIC VALVE LVOT Vmax:   135.00 cm/s LVOT Vmean:  89.400 cm/s LVOT VTI:    0.313 m  AORTA Ao Root diam: 2.90 cm MITRAL VALVE MV Area (PHT): 3.08 cm     SHUNTS MV Decel Time: 246 msec     Systemic VTI:  0.31 m MV E velocity: 105.00 cm/s  Systemic Diam: 1.90 cm MV A velocity: 111.00 cm/s MV E/A ratio:  0.95 Loralie Champagne MD Electronically signed by Loralie Champagne MD Signature Date/Time: 10/08/2019/4:18:50 PM    Final    VAS [place] LOWER EXTREMITY VENOUS (DVT)  Result Date: 10/08/2019  Lower Venous DVTStudy Indications: Pain, and Swelling.  Limitations: Body habitus and depth of vessels, and patient pain/intolerance to compression. Comparison Study: No prior studies. Performing Technologist: Darlin Coco  Examination Guidelines: A complete evaluation includes B-mode imaging, spectral Doppler, color Doppler, and power Doppler as needed of all accessible portions of each vessel. Bilateral testing is considered an integral part of a complete examination. Limited examinations for reoccurring indications may be performed as noted. The reflux portion of the exam is performed with the patient in reverse Trendelenburg.   +---------+---------------+---------+-----------+----------+--------------+ RIGHT    CompressibilityPhasicitySpontaneityPropertiesThrombus Aging +---------+---------------+---------+-----------+----------+--------------+ CFV      Full           Yes      Yes                                 +---------+---------------+---------+-----------+----------+--------------+ SFJ      Full                                                        +---------+---------------+---------+-----------+----------+--------------+ FV Prox  Full                    Yes                                 +---------+---------------+---------+-----------+----------+--------------+ FV Mid                  Yes      Yes                                 +---------+---------------+---------+-----------+----------+--------------+ FV Distal  Yes      Yes                                 +---------+---------------+---------+-----------+----------+--------------+ PFV                     Yes      Yes                                 +---------+---------------+---------+-----------+----------+--------------+ POP      Full           Yes      Yes                                 +---------+---------------+---------+-----------+----------+--------------+ PTV      Full                                                        +---------+---------------+---------+-----------+----------+--------------+ PERO     Full                                                        +---------+---------------+---------+-----------+----------+--------------+   +---------+---------------+---------+-----------+----------+-----------------+ LEFT     CompressibilityPhasicitySpontaneityPropertiesThrombus Aging    +---------+---------------+---------+-----------+----------+-----------------+ CFV      Full           Yes      Yes                                     +---------+---------------+---------+-----------+----------+-----------------+ SFJ      Full                                                           +---------+---------------+---------+-----------+----------+-----------------+ FV Prox  Full                                                           +---------+---------------+---------+-----------+----------+-----------------+ FV Mid                  Yes      Yes                                    +---------+---------------+---------+-----------+----------+-----------------+ FV Distal               Yes      Yes                                    +---------+---------------+---------+-----------+----------+-----------------+  PFV      Full                                                           +---------+---------------+---------+-----------+----------+-----------------+ POP      Full           Yes      Yes                                    +---------+---------------+---------+-----------+----------+-----------------+ PTV      Partial                                      Age Indeterminate +---------+---------------+---------+-----------+----------+-----------------+ PERO                    Yes      Yes                                    +---------+---------------+---------+-----------+----------+-----------------+     Summary: RIGHT: - There is no evidence of deep vein thrombosis in the lower extremity. However, portions of this examination were limited- see technologist comments above.  - No cystic structure found in the popliteal fossa.  LEFT: - Findings consistent with age indeterminate deep vein thrombosis involving the left posterior tibial veins. - Portions of this examination were limited- see technologist comments above. - No cystic structure found in the popliteal fossa.  *See table(s) above for measurements and observations. Electronically signed by Ruta Hinds MD on 10/08/2019 at 6:14:25 PM.     Final    VAS [place] UPPER EXT VEIN MAPPING (PRE-OP AVF)  Result Date: 10/04/2019 UPPER EXTREMITY VEIN MAPPING  Indications: Pre-access. Comparison Study: No prior studies. Performing Technologist: Darlin Coco Supporting Technologist: Sharion Dove RVS  Examination Guidelines: A complete evaluation includes B-mode imaging, spectral Doppler, color Doppler, and power Doppler as needed of all accessible portions of each vessel. Bilateral testing is considered an integral part of a complete examination. Limited examinations for reoccurring indications may be performed as noted. +-----------------+-------------+----------+--------------+ Right Cephalic   Diameter (cm)Depth (cm)   Findings    +-----------------+-------------+----------+--------------+ Shoulder             0.47        2.27                  +-----------------+-------------+----------+--------------+ Prox upper arm       0.47        1.81                  +-----------------+-------------+----------+--------------+ Mid upper arm        0.37        2.00     branching    +-----------------+-------------+----------+--------------+ Dist upper arm       0.46        1.60                  +-----------------+-------------+----------+--------------+ Antecubital fossa    1.12        0.47                  +-----------------+-------------+----------+--------------+  Prox forearm         0.45        1.22     branching    +-----------------+-------------+----------+--------------+ Mid forearm          0.26        0.85     branching    +-----------------+-------------+----------+--------------+ Dist forearm         0.22        0.77                  +-----------------+-------------+----------+--------------+ Wrist                                   not visualized +-----------------+-------------+----------+--------------+ +-----------------+-------------+----------+--------------+ Right Basilic    Diameter (cm)Depth (cm)    Findings    +-----------------+-------------+----------+--------------+ Prox upper arm       0.36                              +-----------------+-------------+----------+--------------+ Mid upper arm        0.32                              +-----------------+-------------+----------+--------------+ Dist upper arm       0.30                 branching    +-----------------+-------------+----------+--------------+ Antecubital fossa    0.30                 branching    +-----------------+-------------+----------+--------------+ Prox forearm         0.15                              +-----------------+-------------+----------+--------------+ Mid forearm          0.11                              +-----------------+-------------+----------+--------------+ Distal forearm       0.11                              +-----------------+-------------+----------+--------------+ Wrist                                   not visualized +-----------------+-------------+----------+--------------+ +-----------------+-------------+----------+------------------------------+ Left Cephalic    Diameter (cm)Depth (cm)           Findings            +-----------------+-------------+----------+------------------------------+ Shoulder                                        not visualized         +-----------------+-------------+----------+------------------------------+ Prox upper arm       0.16        0.17                                  +-----------------+-------------+----------+------------------------------+ Mid upper arm        0.27  1.60             branching            +-----------------+-------------+----------+------------------------------+ Dist upper arm       0.24        1.29             branching            +-----------------+-------------+----------+------------------------------+ Antecubital fossa    0.47        0.49                                   +-----------------+-------------+----------+------------------------------+ Prox forearm         0.26        0.63                                  +-----------------+-------------+----------+------------------------------+ Mid forearm          0.20        0.54   branching and partial thrombus +-----------------+-------------+----------+------------------------------+ Dist forearm         0.18        0.59                                  +-----------------+-------------+----------+------------------------------+ Wrist                0.20        0.69                                  +-----------------+-------------+----------+------------------------------+ +-----------------+-------------+----------+--------------+ Left Basilic     Diameter (cm)Depth (cm)   Findings    +-----------------+-------------+----------+--------------+ Dist upper arm       0.28                   Origin     +-----------------+-------------+----------+--------------+ Antecubital fossa    0.33                              +-----------------+-------------+----------+--------------+ Prox forearm         0.24                 branching    +-----------------+-------------+----------+--------------+ Mid forearm          0.14                 branching    +-----------------+-------------+----------+--------------+ Distal forearm                          not visualized +-----------------+-------------+----------+--------------+ Wrist                                   not visualized +-----------------+-------------+----------+--------------+ *See table(s) above for measurements and observations.  Diagnosing physician: Ruta Hinds MD Electronically signed by Ruta Hinds MD on 10/04/2019 at 12:29:55 PM.    Final    Echo: Left Ventricle: Left ventricular ejection fraction, by estimation, is 60 to 65%. The left ventricle has normal function. The left ventricle has no regional wall motion  abnormalities. Definity contrast agent was given IV to delineate the left  ventricular endocardial borders. The left ventricular internal cavity size was normal in size. There is no left ventricular hypertrophy. Left ventricular diastolic parameters are consistent with Grade I diastolic dysfunction (impaired relaxation).   Right Ventricle: The right ventricular size is normal. No increase in right ventricular wall thickness. Right ventricular systolic function is normal. Tricuspid regurgitation signal is inadequate for assessing PA pressure.   Subjective: "I feel ok."  Discharge Exam: Vitals:   10/10/19 0718 10/10/19 1200  BP: (!) 128/52 (!) 133/64  Pulse: 83 82  Resp: 23 23  Temp: 98.4 F (36.9 C) 98.2 F (36.8 C)  SpO2: 96% 92%   Vitals:   10/10/19 0000 10/10/19 0422 10/10/19 0718 10/10/19 1200  BP: (!) 130/56 (!) 139/51 (!) 128/52 (!) 133/64  Pulse: 83 81 83 82  Resp: '22 18 23 23  '$ Temp: 98.5 F (36.9 C) 98.6 F (37 C) 98.4 F (36.9 C) 98.2 F (36.8 C)  TempSrc: Oral Oral Oral Oral  SpO2: 95% 96% 96% 92%  Weight:      Height:        General: 64 y.o. female resting in bed in NAD Cardiovascular: RRR, +S1, S2, no m/g/r, equal pulses throughout Respiratory: CTABL, no w/r/r, normal WOB, on RA GI: BS+, NDNT, no masses noted, no organomegaly noted MSK: No e/c/c Neuro: A&O x 3, no focal deficits Psyc: Appropriate interaction and affect, calm/cooperative  The results of significant diagnostics from this hospitalization (including imaging, microbiology, ancillary and laboratory) are listed below for reference.     Microbiology: Recent Results (from the past 240 hour(s))  SARS Coronavirus 2 by RT PCR (hospital order, performed in [facility] hospital lab) Nasopharyngeal Nasopharyngeal Swab     Status: None   Collection Time: 10/06/19  7:13 PM   Specimen: Nasopharyngeal Swab  Result Value Ref Range Status   SARS Coronavirus 2 NEGATIVE NEGATIVE Final    Comment:  (NOTE) SARS-CoV-2 target nucleic acids are NOT DETECTED.  The SARS-CoV-2 RNA is generally detectable in upper and lower respiratory specimens during the acute phase of infection. The lowest concentration of SARS-CoV-2 viral copies this assay can detect is 250 copies / mL. A negative result does not preclude SARS-CoV-2 infection and should not be used as the sole basis for treatment or other patient management decisions.  A negative result may occur with improper specimen collection / handling, submission of specimen other than nasopharyngeal swab, presence of viral mutation(s) within the areas targeted by this assay, and inadequate number of viral copies (<250 copies / mL). A negative result must be combined with clinical observations, patient history, and epidemiological information.  Fact Sheet for Patients:   StrictlyIdeas.no  Fact Sheet for Healthcare Providers: BankingDealers.co.za  This test is not yet approved or  cleared by the [place] FDA and has been authorized for detection and/or diagnosis of SARS-CoV-2 by FDA under an Emergency Use Authorization (EUA).  This EUA will remain in effect (meaning this test can be used) for the duration of the COVID-19 declaration under Section 564(b)(1) of the Act, 21 U.S.C. section 360bbb-3(b)(1), unless the authorization is terminated or revoked sooner.  Performed at [facility], [place] [address] [place], [place] [address]   Surgical pcr screen     Status: None   Collection Time: 10/07/19  4:15 AM   Specimen: Nasal Mucosa; Nasal Swab  Result Value Ref Range Status   MRSA, PCR NEGATIVE NEGATIVE Final   Staphylococcus aureus NEGATIVE NEGATIVE Final    Comment: (NOTE) The  Xpert SA Assay (FDA approved for NASAL specimens in patients 20 years of age and older), is one component of a comprehensive surveillance program. It is not intended to diagnose infection nor to guide or  monitor treatment. Performed at [facility], [place] [address], [place], [place] [address]   SARS Coronavirus 2 by RT PCR (hospital order, performed in [facility] hospital lab) Nasopharyngeal Nasopharyngeal Swab     Status: None   Collection Time: 10/08/19  4:50 AM   Specimen: Nasopharyngeal Swab  Result Value Ref Range Status   SARS Coronavirus 2 NEGATIVE NEGATIVE Final    Comment: (NOTE) SARS-CoV-2 target nucleic acids are NOT DETECTED.  The SARS-CoV-2 RNA is generally detectable in upper and lower respiratory specimens during the acute phase of infection. The lowest concentration of SARS-CoV-2 viral copies this assay can detect is 250 copies / mL. A negative result does not preclude SARS-CoV-2 infection and should not be used as the sole basis for treatment or other patient management decisions.  A negative result may occur with improper specimen collection / handling, submission of specimen other than nasopharyngeal swab, presence of viral mutation(s) within the areas targeted by this assay, and inadequate number of viral copies (<250 copies / mL). A negative result must be combined with clinical observations, patient history, and epidemiological information.  Fact Sheet for Patients:   StrictlyIdeas.no  Fact Sheet for Healthcare Providers: BankingDealers.co.za  This test is not yet approved or  cleared by the [place] FDA and has been authorized for detection and/or diagnosis of SARS-CoV-2 by FDA under an Emergency Use Authorization (EUA).  This EUA will remain in effect (meaning this test can be used) for the duration of the COVID-19 declaration under Section 564(b)(1) of the Act, 21 U.S.C. section 360bbb-3(b)(1), unless the authorization is terminated or revoked sooner.  Performed at [facility], [place] [address], [place], [place] [address]      Labs: BNP (last 3 results) Recent Labs    10/08/19 0149   BNP 254.2*   Basic Metabolic Panel: Recent Labs  Lab 10/04/19 0449 10/04/19 0449 10/05/19 0540 10/05/19 0540 10/06/19 0318 10/06/19 0318 10/07/19 0223 10/08/19 0259 10/08/19 0415 10/08/19 0442 10/08/19 0801 10/09/19 0501  NA 142   < > 139  139   < > 139   < > 140 141 139 141  --  142  K 3.5   < > 3.2*  3.1*   < > 3.8   < > 3.5 4.1 4.2 4.0  --  3.9  CL 111   < > 104  104  --  107  --  106  --  105  --   --  108  CO2 22   < > 26  26  --  23  --  29  --  26  --   --  23  GLUCOSE 167*   < > 109*  109*  --  133*  --  131*  --  195*  --   --  160*  BUN 40*   < > 18  19  --  25*  --  14  --  21  --   --  28*  CREATININE 7.04*   < > 3.98*  3.93*   < > 5.68*  --  4.46*  --  5.82*  --  5.91* 7.03*  CALCIUM 9.0   < > 8.3*  8.2*  --  9.3  --  9.3  --  9.7  --   --  9.4  MG  --   --   --   --   --   --   --   --   --   --   --  2.0  PHOS 6.6*  --  3.6  --  6.5*  --  5.4*  --   --   --   --   --    < > = values in this interval not displayed.   Liver Function Tests: Recent Labs  Lab 10/05/19 0540 10/06/19 0318 10/07/19 0223 10/08/19 0415 10/09/19 0501  AST 16  --   --  26 25  ALT 12  --   --  10 6  ALKPHOS 55  --   --  49 61  BILITOT 0.4  --   --  0.3 0.6  PROT 5.1*  --   --  4.9* 4.8*  ALBUMIN 2.0*  1.9* 1.9* 1.8* 1.9* 1.8*   No results for input(s): LIPASE, AMYLASE in the last 168 hours. No results for input(s): AMMONIA in the last 168 hours. CBC: Recent Labs  Lab 10/04/19 0449 10/04/19 0449 10/05/19 0540 10/05/19 0540 10/07/19 0223 10/07/19 0223 10/07/19 1833 10/08/19 0259 10/08/19 0415 10/08/19 0442 10/09/19 0501  WBC 6.4  --  6.3  --  6.5  --   --   --  10.1  --  7.8  NEUTROABS 4.7  --  4.2  --   --   --   --   --  9.0*  --   --   HGB 7.2*   < > 7.3*   < > 6.8*   < > 8.1* 8.5* 7.7* 9.9* 7.3*  HCT 25.0*   < > 25.4*   < > 24.7*   < > 29.0* 25.0* 28.1* 29.0* 26.2*  MCV 93.6  --  93.4  --  96.9  --   --   --  100.0  --  98.5  PLT 119*  --  122*  --  106*   --   --   --  116*  --  103*   < > = values in this interval not displayed.   Cardiac Enzymes: No results for input(s): CKTOTAL, CKMB, CKMBINDEX, TROPONINI in the last 168 hours. BNP: Invalid input(s): POCBNP CBG: Recent Labs  Lab 10/09/19 2023 10/10/19 0037 10/10/19 0419 10/10/19 0721 10/10/19 1158  GLUCAP 163* 138* 111* 96 169*   D-Dimer No results for input(s): DDIMER in the last 72 hours. Hgb A1c No results for input(s): HGBA1C in the last 72 hours. Lipid Profile No results for input(s): CHOL, HDL, LDLCALC, TRIG, CHOLHDL, LDLDIRECT in the last 72 hours. Thyroid function studies No results for input(s): TSH, T4TOTAL, T3FREE, THYROIDAB in the last 72 hours.  Invalid input(s): FREET3 Anemia work up No results for input(s): VITAMINB12, FOLATE, FERRITIN, TIBC, IRON, RETICCTPCT in the last 72 hours. Urinalysis    Component Value Date/Time   COLORURINE YELLOW 10/02/2019 1940   APPEARANCEUR HAZY (A) 10/02/2019 1940   LABSPEC 1.020 10/02/2019 1940   PHURINE 5.0 10/02/2019 1940   GLUCOSEU >=500 (A) 10/02/2019 1940   HGBUR SMALL (A) 10/02/2019 1940   BILIRUBINUR NEGATIVE 10/02/2019 1940   KETONESUR NEGATIVE 10/02/2019 1940   PROTEINUR >=300 (A) 10/02/2019 1940   UROBILINOGEN 0.2 02/20/2008 1241   NITRITE NEGATIVE 10/02/2019 1940   LEUKOCYTESUR NEGATIVE 10/02/2019 1940   Sepsis Labs Invalid input(s): PROCALCITONIN,  WBC,  LACTICIDVEN Microbiology Recent Results (from the  past 240 hour(s))  SARS Coronavirus 2 by RT PCR (hospital order, performed in [facility] hospital lab) Nasopharyngeal Nasopharyngeal Swab     Status: None   Collection Time: 10/06/19  7:13 PM   Specimen: Nasopharyngeal Swab  Result Value Ref Range Status   SARS Coronavirus 2 NEGATIVE NEGATIVE Final    Comment: (NOTE) SARS-CoV-2 target nucleic acids are NOT DETECTED.  The SARS-CoV-2 RNA is generally detectable in upper and lower respiratory specimens during the acute phase of infection. The  lowest concentration of SARS-CoV-2 viral copies this assay can detect is 250 copies / mL. A negative result does not preclude SARS-CoV-2 infection and should not be used as the sole basis for treatment or other patient management decisions.  A negative result may occur with improper specimen collection / handling, submission of specimen other than nasopharyngeal swab, presence of viral mutation(s) within the areas targeted by this assay, and inadequate number of viral copies (<250 copies / mL). A negative result must be combined with clinical observations, patient history, and epidemiological information.  Fact Sheet for Patients:   BoilerBrush.com.cy  Fact Sheet for Healthcare Providers: https://pope.com/  This test is not yet approved or  cleared by the [place] FDA and has been authorized for detection and/or diagnosis of SARS-CoV-2 by FDA under an Emergency Use Authorization (EUA).  This EUA will remain in effect (meaning this test can be used) for the duration of the COVID-19 declaration under Section 564(b)(1) of the Act, 21 U.S.C. section 360bbb-3(b)(1), unless the authorization is terminated or revoked sooner.  Performed at [facility], [address] [place], [place] [address]   Surgical pcr screen     Status: None   Collection Time: 10/07/19  4:15 AM   Specimen: Nasal Mucosa; Nasal Swab  Result Value Ref Range Status   MRSA, PCR NEGATIVE NEGATIVE Final   Staphylococcus aureus NEGATIVE NEGATIVE Final    Comment: (NOTE) The Xpert SA Assay (FDA approved for NASAL specimens in patients 26 years of age and older), is one component of a comprehensive surveillance program. It is not intended to diagnose infection nor to guide or monitor treatment. Performed at [facility], [address], [place], [place] [address]   SARS Coronavirus 2 by RT PCR (hospital order, performed in [facility] hospital lab)  Nasopharyngeal Nasopharyngeal Swab     Status: None   Collection Time: 10/08/19  4:50 AM   Specimen: Nasopharyngeal Swab  Result Value Ref Range Status   SARS Coronavirus 2 NEGATIVE NEGATIVE Final    Comment: (NOTE) SARS-CoV-2 target nucleic acids are NOT DETECTED.  The SARS-CoV-2 RNA is generally detectable in upper and lower respiratory specimens during the acute phase of infection. The lowest concentration of SARS-CoV-2 viral copies this assay can detect is 250 copies / mL. A negative result does not preclude SARS-CoV-2 infection and should not be used as the sole basis for treatment or other patient management decisions.  A negative result may occur with improper specimen collection / handling, submission of specimen other than nasopharyngeal swab, presence of viral mutation(s) within the areas targeted by this assay, and inadequate number of viral copies (<250 copies / mL). A negative result must be combined with clinical observations, patient history, and epidemiological information.  Fact Sheet for Patients:   BoilerBrush.com.cy  Fact Sheet for Healthcare Providers: https://pope.com/  This test is not yet approved or  cleared by the [place] FDA and has been authorized for detection and/or diagnosis of SARS-CoV-2  by FDA under an Emergency Use Authorization (EUA).  This EUA will remain in effect (meaning this test can be used) for the duration of the COVID-19 declaration under Section 564(b)(1) of the Act, 21 U.S.C. section 360bbb-3(b)(1), unless the authorization is terminated or revoked sooner.  Performed at [facility], [place] [address], [place], [place] [address]     Time coordinating discharge: 35 minutes  SIGNED:   Jonnie Finner, DO  [facility] 10/10/2019, 12:38 PM   If 7PM-7AM, please contact night-coverage [email or web address]

## 2019-10-10 NOTE — TOC Transition Note (Signed)
Transition of Care [facility]) - CM/SW Discharge Note   Patient Details  Name: Kristen English MRN: [id number] Date of Birth: [birth year]  Transition of Care [facility]) CM/SW Contact:  Verdell Carmine, RN Phone Number: 10/10/2019, 3:56 PM   Clinical Narrative:    Remote health, ashley called about the patient. Patient updated about O2 and about remote health possibly calling and visiting patient. Patient agreeable to this.    Final next level of care: Home/Self Care Barriers to Discharge: No Barriers Identified   Patient Goals and CMS Choice Patient states their goals for this hospitalization and ongoing recovery are:: Home   Choice offered to / list presented to : Patient  Discharge Placement                       Discharge Plan and Services                DME Arranged: Oxygen DME Agency: [employer] Date DME Agency Contacted: 10/10/19 Time DME Agency Contacted: [phone]              Social Determinants of Health (SDOH) Interventions     Readmission Risk Interventions Readmission Risk Prevention Plan 10/06/2019  Transportation Screening Complete  PCP or Specialist Appt within 3-5 Days Not Complete  HRI or [place] Complete  Social Work Consult for [place] Planning/Counseling Complete  Palliative Care Screening Not Applicable  Medication Review [employer]) Referral to Pharmacy  Some recent data might be hidden

## 2019-10-10 NOTE — Progress Notes (Signed)
Pt given discharge instructions, prescriptions, and care notes. Home O2 delivered to bedside. Pt verbalized understanding AEB no further questions or concerns at this time. IV was discontinued, no redness, pain, or swelling noted at this time. Telemetry discontinued and Centralized Telemetry was notified. Pt left the floor via wheelchair with staff in stable condition.

## 2019-10-11 DIAGNOSIS — N2581 Secondary hyperparathyroidism of renal origin: Secondary | ICD-10-CM | POA: Diagnosis not present

## 2019-10-11 DIAGNOSIS — N186 End stage renal disease: Secondary | ICD-10-CM | POA: Diagnosis not present

## 2019-10-11 DIAGNOSIS — Z992 Dependence on renal dialysis: Secondary | ICD-10-CM | POA: Diagnosis not present

## 2019-10-12 ENCOUNTER — Telehealth: Payer: Self-pay | Admitting: Nephrology

## 2019-10-12 NOTE — Telephone Encounter (Signed)
Transition of care Call from inpatient facility   Date of DC  10/10/2019  Date of Contact = 10/12/19  Method of Contact = Phone  Talk To = Patient   Patient contacted to discuss Transition of care from recent inpatient hospitalization. Patient  Was admitted to [facility] from 7/21- 10/10/2019 .  Medications were reviewed   Patient will follow up with her  outpatient  Dialysis center on 10/14/19  Other follow up needed includes  Review bp meds at center as decrease edw

## 2019-10-13 ENCOUNTER — Other Ambulatory Visit: Payer: Self-pay

## 2019-10-13 DIAGNOSIS — I5031 Acute diastolic (congestive) heart failure: Secondary | ICD-10-CM

## 2019-10-13 NOTE — Patient Outreach (Signed)
[facility]) Care Management  10/13/2019  Kristen English February 21, 1956 [id number]     Transition of Care Referral  Referral Date: 10/13/2019 Referral Source: [facility] Liaison Date of Discharge: 10/13/19 Facility: [place]: [facility]    Outreach attempt # 1 to patient. No answer. RN CM left HIPAA compliant voicemail message along with contact info.    Plan: RN CM will make outreach attempt to patient within 3-4 business days. RN CM will send unsuccessful outreach letter to patient.  Enzo Montgomery, RN,BSN,CCM [facility] Telephonic Care Management Coordinator Direct Phone: [phone] Toll Free: [phone] Fax: [phone]

## 2019-10-14 ENCOUNTER — Other Ambulatory Visit: Payer: Self-pay

## 2019-10-14 DIAGNOSIS — N186 End stage renal disease: Secondary | ICD-10-CM | POA: Diagnosis not present

## 2019-10-14 DIAGNOSIS — J810 Acute pulmonary edema: Secondary | ICD-10-CM | POA: Diagnosis not present

## 2019-10-14 DIAGNOSIS — J9602 Acute respiratory failure with hypercapnia: Secondary | ICD-10-CM | POA: Diagnosis not present

## 2019-10-14 DIAGNOSIS — N2581 Secondary hyperparathyroidism of renal origin: Secondary | ICD-10-CM | POA: Diagnosis not present

## 2019-10-14 DIAGNOSIS — G4733 Obstructive sleep apnea (adult) (pediatric): Secondary | ICD-10-CM | POA: Diagnosis not present

## 2019-10-14 DIAGNOSIS — Z992 Dependence on renal dialysis: Secondary | ICD-10-CM | POA: Diagnosis not present

## 2019-10-14 NOTE — Patient Outreach (Signed)
[facility]) Care Management  10/14/2019  Kristen English October 27, 1955 [id number]   Transition of Care Referral  Referral Date: 10/13/2019 Referral Source: [facility] Liaison Date of Discharge: 10/13/19 Facility: [place]: [facility] Medicare PCP Office Does TOC    Unsuccessful outreach attempt to patient.     Plan: RN CM will make outreach attempt within 3-4 business days.   Enzo Montgomery, RN,BSN,CCM [place] [facility] Telephonic Care Management Coordinator Direct Phone: [phone] Toll Free: [phone] Fax: [phone]

## 2019-10-15 ENCOUNTER — Other Ambulatory Visit: Payer: Self-pay

## 2019-10-15 DIAGNOSIS — E11649 Type 2 diabetes mellitus with hypoglycemia without coma: Secondary | ICD-10-CM | POA: Diagnosis not present

## 2019-10-15 DIAGNOSIS — N19 Unspecified kidney failure: Secondary | ICD-10-CM | POA: Diagnosis not present

## 2019-10-15 DIAGNOSIS — N185 Chronic kidney disease, stage 5: Secondary | ICD-10-CM | POA: Diagnosis not present

## 2019-10-15 DIAGNOSIS — E782 Mixed hyperlipidemia: Secondary | ICD-10-CM | POA: Diagnosis not present

## 2019-10-15 DIAGNOSIS — N184 Chronic kidney disease, stage 4 (severe): Secondary | ICD-10-CM | POA: Diagnosis not present

## 2019-10-15 DIAGNOSIS — E1122 Type 2 diabetes mellitus with diabetic chronic kidney disease: Secondary | ICD-10-CM | POA: Diagnosis not present

## 2019-10-15 DIAGNOSIS — E039 Hypothyroidism, unspecified: Secondary | ICD-10-CM | POA: Diagnosis not present

## 2019-10-15 DIAGNOSIS — I10 Essential (primary) hypertension: Secondary | ICD-10-CM | POA: Diagnosis not present

## 2019-10-15 DIAGNOSIS — D649 Anemia, unspecified: Secondary | ICD-10-CM | POA: Diagnosis not present

## 2019-10-15 NOTE — Patient Outreach (Signed)
[place] [facility]) Care Management  10/15/2019  Kristen English 1955/07/20 [id number]   Transition of Care Referral  Referral Date:10/13/2019 Referral [facility] Liaison Date of Discharge:10/13/19 [place] Medicare PCP Office Does [facility]    Outreach attempt #3 to patient. Spoke with patient who denies any acute issues or concerns at present. She voices that she is doing well since returning home. She confirms she has all her meds in the home and uses mail order for them. She goes for MD follow up appt on Friday and has transportation. Patient reports she has supportive son in the home to assist her as needed. She reports being able to manage her conditions at present. [facility] services discussed. Patient voices she does not feel like she needs future nursing follow up calls. She is aware that she can call if needs arise. She has also declined Remote Health services as well.      Plan: RN CM will close case at this time.   Enzo Montgomery, RN,BSN,CCM [place] [facility] Telephonic Care Management Coordinator Direct Phone: [phone] Toll Free: [phone] Fax: [phone]

## 2019-10-16 DIAGNOSIS — N186 End stage renal disease: Secondary | ICD-10-CM | POA: Diagnosis not present

## 2019-10-16 DIAGNOSIS — N2581 Secondary hyperparathyroidism of renal origin: Secondary | ICD-10-CM | POA: Diagnosis not present

## 2019-10-16 DIAGNOSIS — Z992 Dependence on renal dialysis: Secondary | ICD-10-CM | POA: Diagnosis not present

## 2019-10-17 DIAGNOSIS — J9602 Acute respiratory failure with hypercapnia: Secondary | ICD-10-CM | POA: Diagnosis not present

## 2019-10-17 DIAGNOSIS — I10 Essential (primary) hypertension: Secondary | ICD-10-CM | POA: Diagnosis not present

## 2019-10-17 DIAGNOSIS — I5032 Chronic diastolic (congestive) heart failure: Secondary | ICD-10-CM | POA: Diagnosis not present

## 2019-10-17 DIAGNOSIS — N186 End stage renal disease: Secondary | ICD-10-CM | POA: Diagnosis not present

## 2019-10-17 DIAGNOSIS — E1122 Type 2 diabetes mellitus with diabetic chronic kidney disease: Secondary | ICD-10-CM | POA: Diagnosis not present

## 2019-10-18 DIAGNOSIS — N186 End stage renal disease: Secondary | ICD-10-CM | POA: Diagnosis not present

## 2019-10-18 DIAGNOSIS — Z2839 Other underimmunization status: Secondary | ICD-10-CM | POA: Insufficient documentation

## 2019-10-18 DIAGNOSIS — E1122 Type 2 diabetes mellitus with diabetic chronic kidney disease: Secondary | ICD-10-CM | POA: Diagnosis not present

## 2019-10-18 DIAGNOSIS — N2581 Secondary hyperparathyroidism of renal origin: Secondary | ICD-10-CM | POA: Diagnosis not present

## 2019-10-18 DIAGNOSIS — Z992 Dependence on renal dialysis: Secondary | ICD-10-CM | POA: Diagnosis not present

## 2019-10-18 DIAGNOSIS — Z23 Encounter for immunization: Secondary | ICD-10-CM | POA: Insufficient documentation

## 2019-10-21 DIAGNOSIS — N2581 Secondary hyperparathyroidism of renal origin: Secondary | ICD-10-CM | POA: Diagnosis not present

## 2019-10-21 DIAGNOSIS — Z992 Dependence on renal dialysis: Secondary | ICD-10-CM | POA: Diagnosis not present

## 2019-10-21 DIAGNOSIS — N186 End stage renal disease: Secondary | ICD-10-CM | POA: Diagnosis not present

## 2019-10-23 DIAGNOSIS — Z992 Dependence on renal dialysis: Secondary | ICD-10-CM | POA: Diagnosis not present

## 2019-10-23 DIAGNOSIS — N2581 Secondary hyperparathyroidism of renal origin: Secondary | ICD-10-CM | POA: Diagnosis not present

## 2019-10-23 DIAGNOSIS — N186 End stage renal disease: Secondary | ICD-10-CM | POA: Diagnosis not present

## 2019-10-25 DIAGNOSIS — N2581 Secondary hyperparathyroidism of renal origin: Secondary | ICD-10-CM | POA: Diagnosis not present

## 2019-10-25 DIAGNOSIS — Z992 Dependence on renal dialysis: Secondary | ICD-10-CM | POA: Diagnosis not present

## 2019-10-25 DIAGNOSIS — N186 End stage renal disease: Secondary | ICD-10-CM | POA: Diagnosis not present

## 2019-10-27 DIAGNOSIS — J90 Pleural effusion, not elsewhere classified: Secondary | ICD-10-CM | POA: Diagnosis not present

## 2019-10-27 DIAGNOSIS — Z95828 Presence of other vascular implants and grafts: Secondary | ICD-10-CM | POA: Diagnosis not present

## 2019-10-27 DIAGNOSIS — Z01818 Encounter for other preprocedural examination: Secondary | ICD-10-CM | POA: Diagnosis not present

## 2019-10-27 DIAGNOSIS — D4101 Neoplasm of uncertain behavior of right kidney: Secondary | ICD-10-CM | POA: Diagnosis not present

## 2019-10-27 DIAGNOSIS — N289 Disorder of kidney and ureter, unspecified: Secondary | ICD-10-CM | POA: Diagnosis not present

## 2019-10-27 DIAGNOSIS — J9811 Atelectasis: Secondary | ICD-10-CM | POA: Diagnosis not present

## 2019-10-28 DIAGNOSIS — N2581 Secondary hyperparathyroidism of renal origin: Secondary | ICD-10-CM | POA: Diagnosis not present

## 2019-10-28 DIAGNOSIS — Z992 Dependence on renal dialysis: Secondary | ICD-10-CM | POA: Diagnosis not present

## 2019-10-28 DIAGNOSIS — N186 End stage renal disease: Secondary | ICD-10-CM | POA: Diagnosis not present

## 2019-10-30 DIAGNOSIS — N2581 Secondary hyperparathyroidism of renal origin: Secondary | ICD-10-CM | POA: Diagnosis not present

## 2019-10-30 DIAGNOSIS — N186 End stage renal disease: Secondary | ICD-10-CM | POA: Diagnosis not present

## 2019-10-30 DIAGNOSIS — Z992 Dependence on renal dialysis: Secondary | ICD-10-CM | POA: Diagnosis not present

## 2019-11-01 DIAGNOSIS — N2581 Secondary hyperparathyroidism of renal origin: Secondary | ICD-10-CM | POA: Diagnosis not present

## 2019-11-01 DIAGNOSIS — Z992 Dependence on renal dialysis: Secondary | ICD-10-CM | POA: Diagnosis not present

## 2019-11-01 DIAGNOSIS — N186 End stage renal disease: Secondary | ICD-10-CM | POA: Diagnosis not present

## 2019-11-04 DIAGNOSIS — N2581 Secondary hyperparathyroidism of renal origin: Secondary | ICD-10-CM | POA: Diagnosis not present

## 2019-11-04 DIAGNOSIS — N186 End stage renal disease: Secondary | ICD-10-CM | POA: Diagnosis not present

## 2019-11-04 DIAGNOSIS — Z992 Dependence on renal dialysis: Secondary | ICD-10-CM | POA: Diagnosis not present

## 2019-11-05 DIAGNOSIS — E875 Hyperkalemia: Secondary | ICD-10-CM | POA: Insufficient documentation

## 2019-11-06 DIAGNOSIS — Z992 Dependence on renal dialysis: Secondary | ICD-10-CM | POA: Diagnosis not present

## 2019-11-06 DIAGNOSIS — N2581 Secondary hyperparathyroidism of renal origin: Secondary | ICD-10-CM | POA: Diagnosis not present

## 2019-11-06 DIAGNOSIS — N186 End stage renal disease: Secondary | ICD-10-CM | POA: Diagnosis not present

## 2019-11-07 DIAGNOSIS — E039 Hypothyroidism, unspecified: Secondary | ICD-10-CM | POA: Diagnosis not present

## 2019-11-07 DIAGNOSIS — Z0000 Encounter for general adult medical examination without abnormal findings: Secondary | ICD-10-CM | POA: Diagnosis not present

## 2019-11-07 DIAGNOSIS — N186 End stage renal disease: Secondary | ICD-10-CM | POA: Diagnosis not present

## 2019-11-07 DIAGNOSIS — I5032 Chronic diastolic (congestive) heart failure: Secondary | ICD-10-CM | POA: Diagnosis not present

## 2019-11-07 DIAGNOSIS — Z1389 Encounter for screening for other disorder: Secondary | ICD-10-CM | POA: Diagnosis not present

## 2019-11-07 DIAGNOSIS — E1122 Type 2 diabetes mellitus with diabetic chronic kidney disease: Secondary | ICD-10-CM | POA: Diagnosis not present

## 2019-11-07 DIAGNOSIS — Z23 Encounter for immunization: Secondary | ICD-10-CM | POA: Diagnosis not present

## 2019-11-07 DIAGNOSIS — M109 Gout, unspecified: Secondary | ICD-10-CM | POA: Diagnosis not present

## 2019-11-07 DIAGNOSIS — E2839 Other primary ovarian failure: Secondary | ICD-10-CM | POA: Diagnosis not present

## 2019-11-07 DIAGNOSIS — Z6835 Body mass index (BMI) 35.0-35.9, adult: Secondary | ICD-10-CM | POA: Diagnosis not present

## 2019-11-07 DIAGNOSIS — D472 Monoclonal gammopathy: Secondary | ICD-10-CM | POA: Diagnosis not present

## 2019-11-07 DIAGNOSIS — E782 Mixed hyperlipidemia: Secondary | ICD-10-CM | POA: Diagnosis not present

## 2019-11-07 DIAGNOSIS — I10 Essential (primary) hypertension: Secondary | ICD-10-CM | POA: Diagnosis not present

## 2019-11-08 DIAGNOSIS — N2581 Secondary hyperparathyroidism of renal origin: Secondary | ICD-10-CM | POA: Diagnosis not present

## 2019-11-08 DIAGNOSIS — N186 End stage renal disease: Secondary | ICD-10-CM | POA: Diagnosis not present

## 2019-11-08 DIAGNOSIS — Z992 Dependence on renal dialysis: Secondary | ICD-10-CM | POA: Diagnosis not present

## 2019-11-10 ENCOUNTER — Other Ambulatory Visit: Payer: Self-pay

## 2019-11-10 DIAGNOSIS — N184 Chronic kidney disease, stage 4 (severe): Secondary | ICD-10-CM

## 2019-11-10 DIAGNOSIS — N179 Acute kidney failure, unspecified: Secondary | ICD-10-CM | POA: Diagnosis not present

## 2019-11-10 DIAGNOSIS — D649 Anemia, unspecified: Secondary | ICD-10-CM | POA: Diagnosis not present

## 2019-11-10 DIAGNOSIS — I10 Essential (primary) hypertension: Secondary | ICD-10-CM | POA: Diagnosis not present

## 2019-11-10 DIAGNOSIS — J9602 Acute respiratory failure with hypercapnia: Secondary | ICD-10-CM | POA: Diagnosis not present

## 2019-11-11 DIAGNOSIS — Z992 Dependence on renal dialysis: Secondary | ICD-10-CM | POA: Diagnosis not present

## 2019-11-11 DIAGNOSIS — N2581 Secondary hyperparathyroidism of renal origin: Secondary | ICD-10-CM | POA: Diagnosis not present

## 2019-11-11 DIAGNOSIS — N186 End stage renal disease: Secondary | ICD-10-CM | POA: Diagnosis not present

## 2019-11-13 ENCOUNTER — Other Ambulatory Visit: Payer: Self-pay | Admitting: General Practice

## 2019-11-13 ENCOUNTER — Other Ambulatory Visit: Payer: Self-pay | Admitting: Internal Medicine

## 2019-11-13 DIAGNOSIS — N2581 Secondary hyperparathyroidism of renal origin: Secondary | ICD-10-CM | POA: Diagnosis not present

## 2019-11-13 DIAGNOSIS — N186 End stage renal disease: Secondary | ICD-10-CM | POA: Diagnosis not present

## 2019-11-13 DIAGNOSIS — E2839 Other primary ovarian failure: Secondary | ICD-10-CM

## 2019-11-13 DIAGNOSIS — Z992 Dependence on renal dialysis: Secondary | ICD-10-CM | POA: Diagnosis not present

## 2019-11-14 DIAGNOSIS — N186 End stage renal disease: Secondary | ICD-10-CM | POA: Diagnosis not present

## 2019-11-14 DIAGNOSIS — T8249XA Other complication of vascular dialysis catheter, initial encounter: Secondary | ICD-10-CM | POA: Diagnosis not present

## 2019-11-14 DIAGNOSIS — Z992 Dependence on renal dialysis: Secondary | ICD-10-CM | POA: Diagnosis not present

## 2019-11-15 DIAGNOSIS — N2581 Secondary hyperparathyroidism of renal origin: Secondary | ICD-10-CM | POA: Diagnosis not present

## 2019-11-15 DIAGNOSIS — Z992 Dependence on renal dialysis: Secondary | ICD-10-CM | POA: Diagnosis not present

## 2019-11-15 DIAGNOSIS — N186 End stage renal disease: Secondary | ICD-10-CM | POA: Diagnosis not present

## 2019-11-17 DIAGNOSIS — E782 Mixed hyperlipidemia: Secondary | ICD-10-CM | POA: Diagnosis not present

## 2019-11-17 DIAGNOSIS — E11649 Type 2 diabetes mellitus with hypoglycemia without coma: Secondary | ICD-10-CM | POA: Diagnosis not present

## 2019-11-17 DIAGNOSIS — D649 Anemia, unspecified: Secondary | ICD-10-CM | POA: Diagnosis not present

## 2019-11-17 DIAGNOSIS — N184 Chronic kidney disease, stage 4 (severe): Secondary | ICD-10-CM | POA: Diagnosis not present

## 2019-11-17 DIAGNOSIS — E1122 Type 2 diabetes mellitus with diabetic chronic kidney disease: Secondary | ICD-10-CM | POA: Diagnosis not present

## 2019-11-17 DIAGNOSIS — N185 Chronic kidney disease, stage 5: Secondary | ICD-10-CM | POA: Diagnosis not present

## 2019-11-17 DIAGNOSIS — E039 Hypothyroidism, unspecified: Secondary | ICD-10-CM | POA: Diagnosis not present

## 2019-11-17 DIAGNOSIS — N19 Unspecified kidney failure: Secondary | ICD-10-CM | POA: Diagnosis not present

## 2019-11-17 DIAGNOSIS — I10 Essential (primary) hypertension: Secondary | ICD-10-CM | POA: Diagnosis not present

## 2019-11-18 DIAGNOSIS — N186 End stage renal disease: Secondary | ICD-10-CM | POA: Diagnosis not present

## 2019-11-18 DIAGNOSIS — N2581 Secondary hyperparathyroidism of renal origin: Secondary | ICD-10-CM | POA: Diagnosis not present

## 2019-11-18 DIAGNOSIS — Z992 Dependence on renal dialysis: Secondary | ICD-10-CM | POA: Diagnosis not present

## 2019-11-18 DIAGNOSIS — E1122 Type 2 diabetes mellitus with diabetic chronic kidney disease: Secondary | ICD-10-CM | POA: Diagnosis not present

## 2019-11-19 ENCOUNTER — Ambulatory Visit (HOSPITAL_COMMUNITY)
Admission: RE | Admit: 2019-11-19 | Discharge: 2019-11-19 | Disposition: A | Discharge status: Home / Self Care | Payer: Medicare HMO | Source: Ambulatory Visit | Attending: Vascular Surgery | Admitting: Vascular Surgery

## 2019-11-19 ENCOUNTER — Ambulatory Visit (INDEPENDENT_AMBULATORY_CARE_PROVIDER_SITE_OTHER): Payer: Self-pay | Admitting: Physician Assistant

## 2019-11-19 ENCOUNTER — Other Ambulatory Visit: Payer: Self-pay

## 2019-11-19 VITALS — BP 148/78 | HR 81 | Temp 98.8°F | Resp 20 | Ht 68.0 in | Wt 268.6 lb

## 2019-11-19 DIAGNOSIS — N184 Chronic kidney disease, stage 4 (severe): Secondary | ICD-10-CM

## 2019-11-19 DIAGNOSIS — N186 End stage renal disease: Secondary | ICD-10-CM

## 2019-11-19 DIAGNOSIS — Z992 Dependence on renal dialysis: Secondary | ICD-10-CM

## 2019-11-19 NOTE — Progress Notes (Signed)
  POST OPERATIVE DIALYSIS ACCESS OFFICE NOTE    CC:  F/u for dialysis access surgery  HPI:  This is a 64 y.o. female who is s/p right brachiocephalic AV fistula with superficialization on 10/07/2019 by Dr. Scot Dock.  She presents for routine postoperative evaluation and duplex examination of her fistula.  She denies hand pain, fever or chills.  She tells me her catheter had to be removed and replaced secondary to poor functioning.  She is to undergo right nephrectomy on December 03, 2019 secondary to renal mass.  She was seen in consultation as an inpatient on 10/03/2019 secondary to uremia and history of right renal mass.  During her hospitalization, a right IJ tunneled dialysis catheter was placed by interventional radiology and hemodialysis was initiated.  Dialysis days:  TTS   Dialysis center: [facility]; [employer] History of hypertension and DM, type 2. Allergies  Allergen Reactions  . Morphine And Related Itching    Current Outpatient Medications  Medication Sig Dispense Refill  . amLODipine (NORVASC) 10 MG tablet Take 10 mg by mouth daily.     Marland Kitchen aspirin 81 MG tablet Take 81 mg by mouth daily.     Marland Kitchen atorvastatin (LIPITOR) 40 MG tablet Take 40 mg by mouth daily.     . carvedilol (COREG) 25 MG tablet Take 25 mg by mouth 2 (two) times daily with a meal.     . colchicine 0.6 MG tablet Take 1 tablet (0.6 mg total) by mouth as needed (for gout flare. Don't repeat dose for another 2 weeks.).    Marland Kitchen doxazosin (CARDURA) 2 MG tablet Take 2 mg by mouth at bedtime.     Marland Kitchen levothyroxine (SYNTHROID) 25 MCG tablet Take 25 mcg by mouth daily.    Marland Kitchen oxyCODONE (ROXICODONE) 5 MG immediate release tablet Take 1 tablet (5 mg total) by mouth every 4 (four) hours as needed. (Patient taking differently: Take 5 mg by mouth every 4 (four) hours as needed for moderate pain. ) 15 tablet 0   No current facility-administered medications for this visit.     ROS:  See HPI Vitals:    11/19/19 1447  BP: (!) 148/78  Pulse: 81  Resp: 20  Temp: 98.8 F (37.1 C)  SpO2: 99%    Physical Exam:  General appearance: WD, WN in NAD Cardiac: RRR Respiratory: non-labored Incision:  Healing without signs of infection Extremities:  RUE: Good thrill and bruit in fistula. Fistula is palpable along its course. Hand is warm with 5/5 grip strength and sensation intact.  Dialysis duplex on 11/19/2019  Depth range: 0.2 to 0.8 cm Diameter range: 0.5 to 1.5 cm Patent brachiocephalic AVF.  No stenosis or thrombus noted.  No significant branching noted.  Smaller diameter and increased velocity of the proximal outflow vein may  be due to the anechoic structure anterior to the vein.  Assessment/Plan:   -pt does not have evidence of steal syndrome -dialysis duplex today reveals fistula to be of adequate diameter and depth for access -the fistula/graft may be used starting 10/12/202 -TDC removal by IR when deemed appropriate by renal team -follow-up PRN  Barbie Banner, PA-C 11/19/2019 1:53 PM [facility] [phone]  Clinic MD:  Scot Dock

## 2019-11-20 DIAGNOSIS — Z992 Dependence on renal dialysis: Secondary | ICD-10-CM | POA: Diagnosis not present

## 2019-11-20 DIAGNOSIS — N2581 Secondary hyperparathyroidism of renal origin: Secondary | ICD-10-CM | POA: Diagnosis not present

## 2019-11-20 DIAGNOSIS — N186 End stage renal disease: Secondary | ICD-10-CM | POA: Diagnosis not present

## 2019-11-21 DIAGNOSIS — D4101 Neoplasm of uncertain behavior of right kidney: Secondary | ICD-10-CM | POA: Diagnosis not present

## 2019-11-21 DIAGNOSIS — R3129 Other microscopic hematuria: Secondary | ICD-10-CM | POA: Diagnosis not present

## 2019-11-21 DIAGNOSIS — R809 Proteinuria, unspecified: Secondary | ICD-10-CM | POA: Diagnosis not present

## 2019-11-22 DIAGNOSIS — Z992 Dependence on renal dialysis: Secondary | ICD-10-CM | POA: Diagnosis not present

## 2019-11-22 DIAGNOSIS — N2581 Secondary hyperparathyroidism of renal origin: Secondary | ICD-10-CM | POA: Diagnosis not present

## 2019-11-22 DIAGNOSIS — N186 End stage renal disease: Secondary | ICD-10-CM | POA: Diagnosis not present

## 2019-11-25 DIAGNOSIS — N2581 Secondary hyperparathyroidism of renal origin: Secondary | ICD-10-CM | POA: Diagnosis not present

## 2019-11-25 DIAGNOSIS — Z992 Dependence on renal dialysis: Secondary | ICD-10-CM | POA: Diagnosis not present

## 2019-11-25 DIAGNOSIS — N186 End stage renal disease: Secondary | ICD-10-CM | POA: Diagnosis not present

## 2019-11-26 DIAGNOSIS — I132 Hypertensive heart and chronic kidney disease with heart failure and with stage 5 chronic kidney disease, or end stage renal disease: Secondary | ICD-10-CM | POA: Diagnosis not present

## 2019-11-26 DIAGNOSIS — N186 End stage renal disease: Secondary | ICD-10-CM | POA: Diagnosis not present

## 2019-11-26 DIAGNOSIS — E039 Hypothyroidism, unspecified: Secondary | ICD-10-CM | POA: Diagnosis not present

## 2019-11-26 DIAGNOSIS — I120 Hypertensive chronic kidney disease with stage 5 chronic kidney disease or end stage renal disease: Secondary | ICD-10-CM | POA: Diagnosis not present

## 2019-11-26 DIAGNOSIS — E1122 Type 2 diabetes mellitus with diabetic chronic kidney disease: Secondary | ICD-10-CM | POA: Diagnosis not present

## 2019-11-26 DIAGNOSIS — E876 Hypokalemia: Secondary | ICD-10-CM | POA: Diagnosis not present

## 2019-11-26 DIAGNOSIS — I5030 Unspecified diastolic (congestive) heart failure: Secondary | ICD-10-CM | POA: Diagnosis not present

## 2019-11-26 DIAGNOSIS — C641 Malignant neoplasm of right kidney, except renal pelvis: Secondary | ICD-10-CM | POA: Diagnosis not present

## 2019-11-26 DIAGNOSIS — E785 Hyperlipidemia, unspecified: Secondary | ICD-10-CM | POA: Diagnosis not present

## 2019-11-26 DIAGNOSIS — D631 Anemia in chronic kidney disease: Secondary | ICD-10-CM | POA: Diagnosis not present

## 2019-11-26 DIAGNOSIS — Z01818 Encounter for other preprocedural examination: Secondary | ICD-10-CM | POA: Diagnosis not present

## 2019-11-26 DIAGNOSIS — E873 Alkalosis: Secondary | ICD-10-CM | POA: Diagnosis not present

## 2019-11-26 DIAGNOSIS — Z992 Dependence on renal dialysis: Secondary | ICD-10-CM | POA: Diagnosis not present

## 2019-11-26 DIAGNOSIS — N2889 Other specified disorders of kidney and ureter: Secondary | ICD-10-CM | POA: Diagnosis not present

## 2019-11-27 DIAGNOSIS — N186 End stage renal disease: Secondary | ICD-10-CM | POA: Diagnosis not present

## 2019-11-27 DIAGNOSIS — N2581 Secondary hyperparathyroidism of renal origin: Secondary | ICD-10-CM | POA: Diagnosis not present

## 2019-11-27 DIAGNOSIS — Z992 Dependence on renal dialysis: Secondary | ICD-10-CM | POA: Diagnosis not present

## 2019-11-28 DIAGNOSIS — N185 Chronic kidney disease, stage 5: Secondary | ICD-10-CM | POA: Diagnosis not present

## 2019-11-28 DIAGNOSIS — E782 Mixed hyperlipidemia: Secondary | ICD-10-CM | POA: Diagnosis not present

## 2019-11-28 DIAGNOSIS — E039 Hypothyroidism, unspecified: Secondary | ICD-10-CM | POA: Diagnosis not present

## 2019-11-28 DIAGNOSIS — I10 Essential (primary) hypertension: Secondary | ICD-10-CM | POA: Diagnosis not present

## 2019-11-28 DIAGNOSIS — E1122 Type 2 diabetes mellitus with diabetic chronic kidney disease: Secondary | ICD-10-CM | POA: Diagnosis not present

## 2019-11-28 DIAGNOSIS — D649 Anemia, unspecified: Secondary | ICD-10-CM | POA: Diagnosis not present

## 2019-11-28 DIAGNOSIS — N19 Unspecified kidney failure: Secondary | ICD-10-CM | POA: Diagnosis not present

## 2019-11-28 DIAGNOSIS — N184 Chronic kidney disease, stage 4 (severe): Secondary | ICD-10-CM | POA: Diagnosis not present

## 2019-11-28 DIAGNOSIS — E11649 Type 2 diabetes mellitus with hypoglycemia without coma: Secondary | ICD-10-CM | POA: Diagnosis not present

## 2019-11-29 DIAGNOSIS — Z992 Dependence on renal dialysis: Secondary | ICD-10-CM | POA: Diagnosis not present

## 2019-11-29 DIAGNOSIS — N186 End stage renal disease: Secondary | ICD-10-CM | POA: Diagnosis not present

## 2019-11-29 DIAGNOSIS — N2581 Secondary hyperparathyroidism of renal origin: Secondary | ICD-10-CM | POA: Diagnosis not present

## 2019-12-02 DIAGNOSIS — N186 End stage renal disease: Secondary | ICD-10-CM | POA: Diagnosis not present

## 2019-12-02 DIAGNOSIS — Z992 Dependence on renal dialysis: Secondary | ICD-10-CM | POA: Diagnosis not present

## 2019-12-02 DIAGNOSIS — N2581 Secondary hyperparathyroidism of renal origin: Secondary | ICD-10-CM | POA: Diagnosis not present

## 2019-12-03 DIAGNOSIS — E873 Alkalosis: Secondary | ICD-10-CM | POA: Diagnosis not present

## 2019-12-03 DIAGNOSIS — N186 End stage renal disease: Secondary | ICD-10-CM | POA: Diagnosis not present

## 2019-12-03 DIAGNOSIS — E785 Hyperlipidemia, unspecified: Secondary | ICD-10-CM | POA: Diagnosis not present

## 2019-12-03 DIAGNOSIS — E876 Hypokalemia: Secondary | ICD-10-CM | POA: Diagnosis not present

## 2019-12-03 DIAGNOSIS — C641 Malignant neoplasm of right kidney, except renal pelvis: Secondary | ICD-10-CM | POA: Diagnosis not present

## 2019-12-03 DIAGNOSIS — D4100 Neoplasm of uncertain behavior of unspecified kidney: Secondary | ICD-10-CM | POA: Diagnosis not present

## 2019-12-03 DIAGNOSIS — E039 Hypothyroidism, unspecified: Secondary | ICD-10-CM | POA: Diagnosis not present

## 2019-12-03 DIAGNOSIS — D631 Anemia in chronic kidney disease: Secondary | ICD-10-CM | POA: Diagnosis not present

## 2019-12-03 DIAGNOSIS — Z905 Acquired absence of kidney: Secondary | ICD-10-CM | POA: Diagnosis not present

## 2019-12-03 DIAGNOSIS — E1122 Type 2 diabetes mellitus with diabetic chronic kidney disease: Secondary | ICD-10-CM | POA: Diagnosis not present

## 2019-12-03 DIAGNOSIS — N2889 Other specified disorders of kidney and ureter: Secondary | ICD-10-CM | POA: Diagnosis not present

## 2019-12-03 DIAGNOSIS — Z992 Dependence on renal dialysis: Secondary | ICD-10-CM | POA: Diagnosis not present

## 2019-12-03 DIAGNOSIS — N2581 Secondary hyperparathyroidism of renal origin: Secondary | ICD-10-CM | POA: Diagnosis not present

## 2019-12-03 DIAGNOSIS — N002 Acute nephritic syndrome with diffuse membranous glomerulonephritis: Secondary | ICD-10-CM | POA: Diagnosis not present

## 2019-12-03 DIAGNOSIS — I120 Hypertensive chronic kidney disease with stage 5 chronic kidney disease or end stage renal disease: Secondary | ICD-10-CM | POA: Diagnosis not present

## 2019-12-03 DIAGNOSIS — Z9889 Other specified postprocedural states: Secondary | ICD-10-CM | POA: Diagnosis not present

## 2019-12-04 DIAGNOSIS — Z992 Dependence on renal dialysis: Secondary | ICD-10-CM | POA: Diagnosis not present

## 2019-12-04 DIAGNOSIS — N186 End stage renal disease: Secondary | ICD-10-CM | POA: Diagnosis not present

## 2019-12-04 DIAGNOSIS — E039 Hypothyroidism, unspecified: Secondary | ICD-10-CM | POA: Diagnosis not present

## 2019-12-05 DIAGNOSIS — N186 End stage renal disease: Secondary | ICD-10-CM | POA: Diagnosis not present

## 2019-12-05 DIAGNOSIS — Z905 Acquired absence of kidney: Secondary | ICD-10-CM | POA: Diagnosis not present

## 2019-12-05 DIAGNOSIS — E876 Hypokalemia: Secondary | ICD-10-CM | POA: Insufficient documentation

## 2019-12-05 DIAGNOSIS — D631 Anemia in chronic kidney disease: Secondary | ICD-10-CM | POA: Diagnosis not present

## 2019-12-05 DIAGNOSIS — N2581 Secondary hyperparathyroidism of renal origin: Secondary | ICD-10-CM | POA: Diagnosis not present

## 2019-12-05 DIAGNOSIS — E1122 Type 2 diabetes mellitus with diabetic chronic kidney disease: Secondary | ICD-10-CM | POA: Diagnosis not present

## 2019-12-05 DIAGNOSIS — Z9889 Other specified postprocedural states: Secondary | ICD-10-CM | POA: Diagnosis not present

## 2019-12-05 DIAGNOSIS — Z992 Dependence on renal dialysis: Secondary | ICD-10-CM | POA: Diagnosis not present

## 2019-12-05 DIAGNOSIS — I120 Hypertensive chronic kidney disease with stage 5 chronic kidney disease or end stage renal disease: Secondary | ICD-10-CM | POA: Diagnosis not present

## 2019-12-06 DIAGNOSIS — N2581 Secondary hyperparathyroidism of renal origin: Secondary | ICD-10-CM | POA: Diagnosis not present

## 2019-12-06 DIAGNOSIS — N186 End stage renal disease: Secondary | ICD-10-CM | POA: Diagnosis not present

## 2019-12-06 DIAGNOSIS — Z992 Dependence on renal dialysis: Secondary | ICD-10-CM | POA: Diagnosis not present

## 2019-12-09 DIAGNOSIS — Z992 Dependence on renal dialysis: Secondary | ICD-10-CM | POA: Diagnosis not present

## 2019-12-09 DIAGNOSIS — N2581 Secondary hyperparathyroidism of renal origin: Secondary | ICD-10-CM | POA: Diagnosis not present

## 2019-12-09 DIAGNOSIS — N186 End stage renal disease: Secondary | ICD-10-CM | POA: Diagnosis not present

## 2019-12-11 DIAGNOSIS — Z992 Dependence on renal dialysis: Secondary | ICD-10-CM | POA: Diagnosis not present

## 2019-12-11 DIAGNOSIS — N2581 Secondary hyperparathyroidism of renal origin: Secondary | ICD-10-CM | POA: Diagnosis not present

## 2019-12-11 DIAGNOSIS — N186 End stage renal disease: Secondary | ICD-10-CM | POA: Diagnosis not present

## 2019-12-13 DIAGNOSIS — N186 End stage renal disease: Secondary | ICD-10-CM | POA: Diagnosis not present

## 2019-12-13 DIAGNOSIS — N2581 Secondary hyperparathyroidism of renal origin: Secondary | ICD-10-CM | POA: Diagnosis not present

## 2019-12-13 DIAGNOSIS — Z992 Dependence on renal dialysis: Secondary | ICD-10-CM | POA: Diagnosis not present

## 2019-12-16 DIAGNOSIS — N186 End stage renal disease: Secondary | ICD-10-CM | POA: Diagnosis not present

## 2019-12-16 DIAGNOSIS — Z992 Dependence on renal dialysis: Secondary | ICD-10-CM | POA: Diagnosis not present

## 2019-12-16 DIAGNOSIS — N2581 Secondary hyperparathyroidism of renal origin: Secondary | ICD-10-CM | POA: Diagnosis not present

## 2019-12-18 DIAGNOSIS — E1122 Type 2 diabetes mellitus with diabetic chronic kidney disease: Secondary | ICD-10-CM | POA: Diagnosis not present

## 2019-12-18 DIAGNOSIS — N2581 Secondary hyperparathyroidism of renal origin: Secondary | ICD-10-CM | POA: Diagnosis not present

## 2019-12-18 DIAGNOSIS — Z992 Dependence on renal dialysis: Secondary | ICD-10-CM | POA: Diagnosis not present

## 2019-12-18 DIAGNOSIS — N186 End stage renal disease: Secondary | ICD-10-CM | POA: Diagnosis not present

## 2019-12-20 DIAGNOSIS — N186 End stage renal disease: Secondary | ICD-10-CM | POA: Diagnosis not present

## 2019-12-20 DIAGNOSIS — Z992 Dependence on renal dialysis: Secondary | ICD-10-CM | POA: Diagnosis not present

## 2019-12-20 DIAGNOSIS — N2581 Secondary hyperparathyroidism of renal origin: Secondary | ICD-10-CM | POA: Diagnosis not present

## 2019-12-23 DIAGNOSIS — N2581 Secondary hyperparathyroidism of renal origin: Secondary | ICD-10-CM | POA: Diagnosis not present

## 2019-12-23 DIAGNOSIS — N186 End stage renal disease: Secondary | ICD-10-CM | POA: Diagnosis not present

## 2019-12-23 DIAGNOSIS — Z992 Dependence on renal dialysis: Secondary | ICD-10-CM | POA: Diagnosis not present

## 2019-12-25 DIAGNOSIS — Z992 Dependence on renal dialysis: Secondary | ICD-10-CM | POA: Diagnosis not present

## 2019-12-25 DIAGNOSIS — N2581 Secondary hyperparathyroidism of renal origin: Secondary | ICD-10-CM | POA: Diagnosis not present

## 2019-12-25 DIAGNOSIS — N186 End stage renal disease: Secondary | ICD-10-CM | POA: Diagnosis not present

## 2019-12-27 DIAGNOSIS — Z992 Dependence on renal dialysis: Secondary | ICD-10-CM | POA: Diagnosis not present

## 2019-12-27 DIAGNOSIS — N186 End stage renal disease: Secondary | ICD-10-CM | POA: Diagnosis not present

## 2019-12-27 DIAGNOSIS — N2581 Secondary hyperparathyroidism of renal origin: Secondary | ICD-10-CM | POA: Diagnosis not present

## 2019-12-30 DIAGNOSIS — N2581 Secondary hyperparathyroidism of renal origin: Secondary | ICD-10-CM | POA: Diagnosis not present

## 2019-12-30 DIAGNOSIS — N186 End stage renal disease: Secondary | ICD-10-CM | POA: Diagnosis not present

## 2019-12-30 DIAGNOSIS — A498 Other bacterial infections of unspecified site: Secondary | ICD-10-CM | POA: Insufficient documentation

## 2019-12-30 DIAGNOSIS — Z992 Dependence on renal dialysis: Secondary | ICD-10-CM | POA: Diagnosis not present

## 2020-01-01 DIAGNOSIS — N2581 Secondary hyperparathyroidism of renal origin: Secondary | ICD-10-CM | POA: Diagnosis not present

## 2020-01-01 DIAGNOSIS — Z992 Dependence on renal dialysis: Secondary | ICD-10-CM | POA: Diagnosis not present

## 2020-01-01 DIAGNOSIS — N186 End stage renal disease: Secondary | ICD-10-CM | POA: Diagnosis not present

## 2020-01-03 DIAGNOSIS — N186 End stage renal disease: Secondary | ICD-10-CM | POA: Diagnosis not present

## 2020-01-03 DIAGNOSIS — Z992 Dependence on renal dialysis: Secondary | ICD-10-CM | POA: Diagnosis not present

## 2020-01-03 DIAGNOSIS — N2581 Secondary hyperparathyroidism of renal origin: Secondary | ICD-10-CM | POA: Diagnosis not present

## 2020-01-06 ENCOUNTER — Encounter: Payer: Self-pay | Admitting: Nephrology

## 2020-01-06 DIAGNOSIS — R768 Other specified abnormal immunological findings in serum: Secondary | ICD-10-CM | POA: Diagnosis not present

## 2020-01-06 DIAGNOSIS — Z01818 Encounter for other preprocedural examination: Secondary | ICD-10-CM | POA: Diagnosis not present

## 2020-01-06 DIAGNOSIS — N2581 Secondary hyperparathyroidism of renal origin: Secondary | ICD-10-CM | POA: Diagnosis not present

## 2020-01-06 DIAGNOSIS — Z992 Dependence on renal dialysis: Secondary | ICD-10-CM | POA: Diagnosis not present

## 2020-01-06 DIAGNOSIS — N186 End stage renal disease: Secondary | ICD-10-CM | POA: Diagnosis not present

## 2020-01-08 DIAGNOSIS — N2581 Secondary hyperparathyroidism of renal origin: Secondary | ICD-10-CM | POA: Diagnosis not present

## 2020-01-08 DIAGNOSIS — Z992 Dependence on renal dialysis: Secondary | ICD-10-CM | POA: Diagnosis not present

## 2020-01-08 DIAGNOSIS — N186 End stage renal disease: Secondary | ICD-10-CM | POA: Diagnosis not present

## 2020-01-10 DIAGNOSIS — Z992 Dependence on renal dialysis: Secondary | ICD-10-CM | POA: Diagnosis not present

## 2020-01-10 DIAGNOSIS — N2581 Secondary hyperparathyroidism of renal origin: Secondary | ICD-10-CM | POA: Diagnosis not present

## 2020-01-10 DIAGNOSIS — N186 End stage renal disease: Secondary | ICD-10-CM | POA: Diagnosis not present

## 2020-01-13 DIAGNOSIS — N186 End stage renal disease: Secondary | ICD-10-CM | POA: Diagnosis not present

## 2020-01-13 DIAGNOSIS — Z992 Dependence on renal dialysis: Secondary | ICD-10-CM | POA: Diagnosis not present

## 2020-01-13 DIAGNOSIS — N2581 Secondary hyperparathyroidism of renal origin: Secondary | ICD-10-CM | POA: Diagnosis not present

## 2020-01-15 DIAGNOSIS — N2581 Secondary hyperparathyroidism of renal origin: Secondary | ICD-10-CM | POA: Diagnosis not present

## 2020-01-15 DIAGNOSIS — Z992 Dependence on renal dialysis: Secondary | ICD-10-CM | POA: Diagnosis not present

## 2020-01-15 DIAGNOSIS — N186 End stage renal disease: Secondary | ICD-10-CM | POA: Diagnosis not present

## 2020-01-17 DIAGNOSIS — N186 End stage renal disease: Secondary | ICD-10-CM | POA: Diagnosis not present

## 2020-01-17 DIAGNOSIS — N2581 Secondary hyperparathyroidism of renal origin: Secondary | ICD-10-CM | POA: Diagnosis not present

## 2020-01-17 DIAGNOSIS — Z992 Dependence on renal dialysis: Secondary | ICD-10-CM | POA: Diagnosis not present

## 2020-01-18 DIAGNOSIS — N186 End stage renal disease: Secondary | ICD-10-CM | POA: Diagnosis not present

## 2020-01-18 DIAGNOSIS — E1122 Type 2 diabetes mellitus with diabetic chronic kidney disease: Secondary | ICD-10-CM | POA: Diagnosis not present

## 2020-01-18 DIAGNOSIS — Z992 Dependence on renal dialysis: Secondary | ICD-10-CM | POA: Diagnosis not present

## 2020-01-20 DIAGNOSIS — Z992 Dependence on renal dialysis: Secondary | ICD-10-CM | POA: Diagnosis not present

## 2020-01-20 DIAGNOSIS — N2581 Secondary hyperparathyroidism of renal origin: Secondary | ICD-10-CM | POA: Diagnosis not present

## 2020-01-20 DIAGNOSIS — N186 End stage renal disease: Secondary | ICD-10-CM | POA: Diagnosis not present

## 2020-01-22 DIAGNOSIS — Z992 Dependence on renal dialysis: Secondary | ICD-10-CM | POA: Diagnosis not present

## 2020-01-22 DIAGNOSIS — N2581 Secondary hyperparathyroidism of renal origin: Secondary | ICD-10-CM | POA: Diagnosis not present

## 2020-01-22 DIAGNOSIS — N186 End stage renal disease: Secondary | ICD-10-CM | POA: Diagnosis not present

## 2020-01-24 DIAGNOSIS — Z992 Dependence on renal dialysis: Secondary | ICD-10-CM | POA: Diagnosis not present

## 2020-01-24 DIAGNOSIS — N186 End stage renal disease: Secondary | ICD-10-CM | POA: Diagnosis not present

## 2020-01-24 DIAGNOSIS — N2581 Secondary hyperparathyroidism of renal origin: Secondary | ICD-10-CM | POA: Diagnosis not present

## 2020-01-27 DIAGNOSIS — N186 End stage renal disease: Secondary | ICD-10-CM | POA: Diagnosis not present

## 2020-01-27 DIAGNOSIS — Z992 Dependence on renal dialysis: Secondary | ICD-10-CM | POA: Diagnosis not present

## 2020-01-27 DIAGNOSIS — N2581 Secondary hyperparathyroidism of renal origin: Secondary | ICD-10-CM | POA: Diagnosis not present

## 2020-01-29 DIAGNOSIS — N2581 Secondary hyperparathyroidism of renal origin: Secondary | ICD-10-CM | POA: Diagnosis not present

## 2020-01-29 DIAGNOSIS — N186 End stage renal disease: Secondary | ICD-10-CM | POA: Diagnosis not present

## 2020-01-29 DIAGNOSIS — Z992 Dependence on renal dialysis: Secondary | ICD-10-CM | POA: Diagnosis not present

## 2020-01-31 DIAGNOSIS — N186 End stage renal disease: Secondary | ICD-10-CM | POA: Diagnosis not present

## 2020-01-31 DIAGNOSIS — Z992 Dependence on renal dialysis: Secondary | ICD-10-CM | POA: Diagnosis not present

## 2020-01-31 DIAGNOSIS — N2581 Secondary hyperparathyroidism of renal origin: Secondary | ICD-10-CM | POA: Diagnosis not present

## 2020-02-03 DIAGNOSIS — Z992 Dependence on renal dialysis: Secondary | ICD-10-CM | POA: Diagnosis not present

## 2020-02-03 DIAGNOSIS — N2581 Secondary hyperparathyroidism of renal origin: Secondary | ICD-10-CM | POA: Diagnosis not present

## 2020-02-03 DIAGNOSIS — N186 End stage renal disease: Secondary | ICD-10-CM | POA: Diagnosis not present

## 2020-02-05 DIAGNOSIS — N186 End stage renal disease: Secondary | ICD-10-CM | POA: Diagnosis not present

## 2020-02-05 DIAGNOSIS — Z992 Dependence on renal dialysis: Secondary | ICD-10-CM | POA: Diagnosis not present

## 2020-02-05 DIAGNOSIS — N2581 Secondary hyperparathyroidism of renal origin: Secondary | ICD-10-CM | POA: Diagnosis not present

## 2020-02-06 DIAGNOSIS — Z452 Encounter for adjustment and management of vascular access device: Secondary | ICD-10-CM | POA: Diagnosis not present

## 2020-02-07 DIAGNOSIS — Z992 Dependence on renal dialysis: Secondary | ICD-10-CM | POA: Diagnosis not present

## 2020-02-07 DIAGNOSIS — N186 End stage renal disease: Secondary | ICD-10-CM | POA: Diagnosis not present

## 2020-02-07 DIAGNOSIS — N2581 Secondary hyperparathyroidism of renal origin: Secondary | ICD-10-CM | POA: Diagnosis not present

## 2020-02-09 DIAGNOSIS — N2581 Secondary hyperparathyroidism of renal origin: Secondary | ICD-10-CM | POA: Diagnosis not present

## 2020-02-09 DIAGNOSIS — Z992 Dependence on renal dialysis: Secondary | ICD-10-CM | POA: Diagnosis not present

## 2020-02-09 DIAGNOSIS — N186 End stage renal disease: Secondary | ICD-10-CM | POA: Diagnosis not present

## 2020-02-11 DIAGNOSIS — N2581 Secondary hyperparathyroidism of renal origin: Secondary | ICD-10-CM | POA: Diagnosis not present

## 2020-02-11 DIAGNOSIS — Z992 Dependence on renal dialysis: Secondary | ICD-10-CM | POA: Diagnosis not present

## 2020-02-11 DIAGNOSIS — N186 End stage renal disease: Secondary | ICD-10-CM | POA: Diagnosis not present

## 2020-02-14 DIAGNOSIS — N186 End stage renal disease: Secondary | ICD-10-CM | POA: Diagnosis not present

## 2020-02-14 DIAGNOSIS — Z992 Dependence on renal dialysis: Secondary | ICD-10-CM | POA: Diagnosis not present

## 2020-02-14 DIAGNOSIS — N2581 Secondary hyperparathyroidism of renal origin: Secondary | ICD-10-CM | POA: Diagnosis not present

## 2020-02-17 DIAGNOSIS — E1122 Type 2 diabetes mellitus with diabetic chronic kidney disease: Secondary | ICD-10-CM | POA: Diagnosis not present

## 2020-02-17 DIAGNOSIS — N2581 Secondary hyperparathyroidism of renal origin: Secondary | ICD-10-CM | POA: Diagnosis not present

## 2020-02-17 DIAGNOSIS — Z992 Dependence on renal dialysis: Secondary | ICD-10-CM | POA: Diagnosis not present

## 2020-02-17 DIAGNOSIS — N186 End stage renal disease: Secondary | ICD-10-CM | POA: Diagnosis not present

## 2020-02-19 DIAGNOSIS — Z992 Dependence on renal dialysis: Secondary | ICD-10-CM | POA: Diagnosis not present

## 2020-02-19 DIAGNOSIS — N2581 Secondary hyperparathyroidism of renal origin: Secondary | ICD-10-CM | POA: Diagnosis not present

## 2020-02-19 DIAGNOSIS — N186 End stage renal disease: Secondary | ICD-10-CM | POA: Diagnosis not present

## 2020-02-21 DIAGNOSIS — N186 End stage renal disease: Secondary | ICD-10-CM | POA: Diagnosis not present

## 2020-02-21 DIAGNOSIS — N2581 Secondary hyperparathyroidism of renal origin: Secondary | ICD-10-CM | POA: Diagnosis not present

## 2020-02-21 DIAGNOSIS — Z992 Dependence on renal dialysis: Secondary | ICD-10-CM | POA: Diagnosis not present

## 2020-02-24 DIAGNOSIS — Z992 Dependence on renal dialysis: Secondary | ICD-10-CM | POA: Diagnosis not present

## 2020-02-24 DIAGNOSIS — N2581 Secondary hyperparathyroidism of renal origin: Secondary | ICD-10-CM | POA: Diagnosis not present

## 2020-02-24 DIAGNOSIS — N186 End stage renal disease: Secondary | ICD-10-CM | POA: Diagnosis not present

## 2020-02-25 ENCOUNTER — Ambulatory Visit
Admission: RE | Admit: 2020-02-25 | Discharge: 2020-02-25 | Disposition: A | Discharge status: Home / Self Care | Payer: Medicare HMO | Source: Ambulatory Visit | Attending: Internal Medicine | Admitting: Internal Medicine

## 2020-02-25 ENCOUNTER — Other Ambulatory Visit: Payer: Self-pay

## 2020-02-25 DIAGNOSIS — M85852 Other specified disorders of bone density and structure, left thigh: Secondary | ICD-10-CM | POA: Diagnosis not present

## 2020-02-25 DIAGNOSIS — E11649 Type 2 diabetes mellitus with hypoglycemia without coma: Secondary | ICD-10-CM | POA: Diagnosis not present

## 2020-02-25 DIAGNOSIS — I10 Essential (primary) hypertension: Secondary | ICD-10-CM | POA: Diagnosis not present

## 2020-02-25 DIAGNOSIS — E039 Hypothyroidism, unspecified: Secondary | ICD-10-CM | POA: Diagnosis not present

## 2020-02-25 DIAGNOSIS — D649 Anemia, unspecified: Secondary | ICD-10-CM | POA: Diagnosis not present

## 2020-02-25 DIAGNOSIS — E2839 Other primary ovarian failure: Secondary | ICD-10-CM

## 2020-02-25 DIAGNOSIS — N184 Chronic kidney disease, stage 4 (severe): Secondary | ICD-10-CM | POA: Diagnosis not present

## 2020-02-25 DIAGNOSIS — N185 Chronic kidney disease, stage 5: Secondary | ICD-10-CM | POA: Diagnosis not present

## 2020-02-25 DIAGNOSIS — N19 Unspecified kidney failure: Secondary | ICD-10-CM | POA: Diagnosis not present

## 2020-02-25 DIAGNOSIS — E1122 Type 2 diabetes mellitus with diabetic chronic kidney disease: Secondary | ICD-10-CM | POA: Diagnosis not present

## 2020-02-25 DIAGNOSIS — Z780 Asymptomatic menopausal state: Secondary | ICD-10-CM | POA: Diagnosis not present

## 2020-02-25 DIAGNOSIS — E782 Mixed hyperlipidemia: Secondary | ICD-10-CM | POA: Diagnosis not present

## 2020-02-26 DIAGNOSIS — N186 End stage renal disease: Secondary | ICD-10-CM | POA: Diagnosis not present

## 2020-02-26 DIAGNOSIS — N2581 Secondary hyperparathyroidism of renal origin: Secondary | ICD-10-CM | POA: Diagnosis not present

## 2020-02-26 DIAGNOSIS — Z992 Dependence on renal dialysis: Secondary | ICD-10-CM | POA: Diagnosis not present

## 2020-02-28 DIAGNOSIS — N2581 Secondary hyperparathyroidism of renal origin: Secondary | ICD-10-CM | POA: Diagnosis not present

## 2020-02-28 DIAGNOSIS — N186 End stage renal disease: Secondary | ICD-10-CM | POA: Diagnosis not present

## 2020-02-28 DIAGNOSIS — Z992 Dependence on renal dialysis: Secondary | ICD-10-CM | POA: Diagnosis not present

## 2020-03-02 DIAGNOSIS — Z992 Dependence on renal dialysis: Secondary | ICD-10-CM | POA: Diagnosis not present

## 2020-03-02 DIAGNOSIS — N2581 Secondary hyperparathyroidism of renal origin: Secondary | ICD-10-CM | POA: Diagnosis not present

## 2020-03-02 DIAGNOSIS — N186 End stage renal disease: Secondary | ICD-10-CM | POA: Diagnosis not present

## 2020-03-04 DIAGNOSIS — Z992 Dependence on renal dialysis: Secondary | ICD-10-CM | POA: Diagnosis not present

## 2020-03-04 DIAGNOSIS — N2581 Secondary hyperparathyroidism of renal origin: Secondary | ICD-10-CM | POA: Diagnosis not present

## 2020-03-04 DIAGNOSIS — N186 End stage renal disease: Secondary | ICD-10-CM | POA: Diagnosis not present

## 2020-03-06 DIAGNOSIS — N186 End stage renal disease: Secondary | ICD-10-CM | POA: Diagnosis not present

## 2020-03-06 DIAGNOSIS — Z992 Dependence on renal dialysis: Secondary | ICD-10-CM | POA: Diagnosis not present

## 2020-03-06 DIAGNOSIS — N2581 Secondary hyperparathyroidism of renal origin: Secondary | ICD-10-CM | POA: Diagnosis not present

## 2020-03-08 DIAGNOSIS — Z0100 Encounter for examination of eyes and vision without abnormal findings: Secondary | ICD-10-CM | POA: Diagnosis not present

## 2020-03-08 DIAGNOSIS — E119 Type 2 diabetes mellitus without complications: Secondary | ICD-10-CM | POA: Diagnosis not present

## 2020-03-09 DIAGNOSIS — N186 End stage renal disease: Secondary | ICD-10-CM | POA: Diagnosis not present

## 2020-03-09 DIAGNOSIS — Z992 Dependence on renal dialysis: Secondary | ICD-10-CM | POA: Diagnosis not present

## 2020-03-09 DIAGNOSIS — N2581 Secondary hyperparathyroidism of renal origin: Secondary | ICD-10-CM | POA: Diagnosis not present

## 2020-03-11 DIAGNOSIS — N186 End stage renal disease: Secondary | ICD-10-CM | POA: Diagnosis not present

## 2020-03-11 DIAGNOSIS — Z992 Dependence on renal dialysis: Secondary | ICD-10-CM | POA: Diagnosis not present

## 2020-03-11 DIAGNOSIS — N2581 Secondary hyperparathyroidism of renal origin: Secondary | ICD-10-CM | POA: Diagnosis not present

## 2020-03-14 DIAGNOSIS — N2581 Secondary hyperparathyroidism of renal origin: Secondary | ICD-10-CM | POA: Diagnosis not present

## 2020-03-14 DIAGNOSIS — N186 End stage renal disease: Secondary | ICD-10-CM | POA: Diagnosis not present

## 2020-03-14 DIAGNOSIS — Z992 Dependence on renal dialysis: Secondary | ICD-10-CM | POA: Diagnosis not present

## 2020-03-16 DIAGNOSIS — Z992 Dependence on renal dialysis: Secondary | ICD-10-CM | POA: Diagnosis not present

## 2020-03-16 DIAGNOSIS — N186 End stage renal disease: Secondary | ICD-10-CM | POA: Diagnosis not present

## 2020-03-16 DIAGNOSIS — N2581 Secondary hyperparathyroidism of renal origin: Secondary | ICD-10-CM | POA: Diagnosis not present

## 2020-03-18 DIAGNOSIS — Z992 Dependence on renal dialysis: Secondary | ICD-10-CM | POA: Diagnosis not present

## 2020-03-18 DIAGNOSIS — N2581 Secondary hyperparathyroidism of renal origin: Secondary | ICD-10-CM | POA: Diagnosis not present

## 2020-03-18 DIAGNOSIS — N186 End stage renal disease: Secondary | ICD-10-CM | POA: Diagnosis not present

## 2020-03-19 DIAGNOSIS — N186 End stage renal disease: Secondary | ICD-10-CM | POA: Diagnosis not present

## 2020-03-19 DIAGNOSIS — E1122 Type 2 diabetes mellitus with diabetic chronic kidney disease: Secondary | ICD-10-CM | POA: Diagnosis not present

## 2020-03-19 DIAGNOSIS — Z992 Dependence on renal dialysis: Secondary | ICD-10-CM | POA: Diagnosis not present

## 2020-03-21 DIAGNOSIS — Z992 Dependence on renal dialysis: Secondary | ICD-10-CM | POA: Diagnosis not present

## 2020-03-21 DIAGNOSIS — N186 End stage renal disease: Secondary | ICD-10-CM | POA: Diagnosis not present

## 2020-03-21 DIAGNOSIS — N2581 Secondary hyperparathyroidism of renal origin: Secondary | ICD-10-CM | POA: Diagnosis not present

## 2020-03-23 DIAGNOSIS — N186 End stage renal disease: Secondary | ICD-10-CM | POA: Diagnosis not present

## 2020-03-23 DIAGNOSIS — Z992 Dependence on renal dialysis: Secondary | ICD-10-CM | POA: Diagnosis not present

## 2020-03-23 DIAGNOSIS — N2581 Secondary hyperparathyroidism of renal origin: Secondary | ICD-10-CM | POA: Diagnosis not present

## 2020-03-25 DIAGNOSIS — N2581 Secondary hyperparathyroidism of renal origin: Secondary | ICD-10-CM | POA: Diagnosis not present

## 2020-03-25 DIAGNOSIS — N186 End stage renal disease: Secondary | ICD-10-CM | POA: Diagnosis not present

## 2020-03-25 DIAGNOSIS — Z992 Dependence on renal dialysis: Secondary | ICD-10-CM | POA: Diagnosis not present

## 2020-03-27 DIAGNOSIS — N2581 Secondary hyperparathyroidism of renal origin: Secondary | ICD-10-CM | POA: Diagnosis not present

## 2020-03-27 DIAGNOSIS — N186 End stage renal disease: Secondary | ICD-10-CM | POA: Diagnosis not present

## 2020-03-27 DIAGNOSIS — Z992 Dependence on renal dialysis: Secondary | ICD-10-CM | POA: Diagnosis not present

## 2020-03-29 DIAGNOSIS — Z85528 Personal history of other malignant neoplasm of kidney: Secondary | ICD-10-CM | POA: Diagnosis not present

## 2020-03-29 DIAGNOSIS — K8020 Calculus of gallbladder without cholecystitis without obstruction: Secondary | ICD-10-CM | POA: Diagnosis not present

## 2020-03-30 DIAGNOSIS — N186 End stage renal disease: Secondary | ICD-10-CM | POA: Diagnosis not present

## 2020-03-30 DIAGNOSIS — Z992 Dependence on renal dialysis: Secondary | ICD-10-CM | POA: Diagnosis not present

## 2020-03-30 DIAGNOSIS — N2581 Secondary hyperparathyroidism of renal origin: Secondary | ICD-10-CM | POA: Diagnosis not present

## 2020-03-31 DIAGNOSIS — N19 Unspecified kidney failure: Secondary | ICD-10-CM | POA: Diagnosis not present

## 2020-03-31 DIAGNOSIS — N185 Chronic kidney disease, stage 5: Secondary | ICD-10-CM | POA: Diagnosis not present

## 2020-03-31 DIAGNOSIS — E11649 Type 2 diabetes mellitus with hypoglycemia without coma: Secondary | ICD-10-CM | POA: Diagnosis not present

## 2020-03-31 DIAGNOSIS — D649 Anemia, unspecified: Secondary | ICD-10-CM | POA: Diagnosis not present

## 2020-03-31 DIAGNOSIS — E1122 Type 2 diabetes mellitus with diabetic chronic kidney disease: Secondary | ICD-10-CM | POA: Diagnosis not present

## 2020-03-31 DIAGNOSIS — I10 Essential (primary) hypertension: Secondary | ICD-10-CM | POA: Diagnosis not present

## 2020-03-31 DIAGNOSIS — E039 Hypothyroidism, unspecified: Secondary | ICD-10-CM | POA: Diagnosis not present

## 2020-03-31 DIAGNOSIS — E782 Mixed hyperlipidemia: Secondary | ICD-10-CM | POA: Diagnosis not present

## 2020-03-31 DIAGNOSIS — N186 End stage renal disease: Secondary | ICD-10-CM | POA: Diagnosis not present

## 2020-04-01 DIAGNOSIS — N2581 Secondary hyperparathyroidism of renal origin: Secondary | ICD-10-CM | POA: Diagnosis not present

## 2020-04-01 DIAGNOSIS — N186 End stage renal disease: Secondary | ICD-10-CM | POA: Diagnosis not present

## 2020-04-01 DIAGNOSIS — Z992 Dependence on renal dialysis: Secondary | ICD-10-CM | POA: Diagnosis not present

## 2020-04-03 DIAGNOSIS — N186 End stage renal disease: Secondary | ICD-10-CM | POA: Diagnosis not present

## 2020-04-03 DIAGNOSIS — N2581 Secondary hyperparathyroidism of renal origin: Secondary | ICD-10-CM | POA: Diagnosis not present

## 2020-04-03 DIAGNOSIS — Z992 Dependence on renal dialysis: Secondary | ICD-10-CM | POA: Diagnosis not present

## 2020-04-06 DIAGNOSIS — Z992 Dependence on renal dialysis: Secondary | ICD-10-CM | POA: Diagnosis not present

## 2020-04-06 DIAGNOSIS — N2581 Secondary hyperparathyroidism of renal origin: Secondary | ICD-10-CM | POA: Diagnosis not present

## 2020-04-06 DIAGNOSIS — N186 End stage renal disease: Secondary | ICD-10-CM | POA: Diagnosis not present

## 2020-04-08 DIAGNOSIS — Z992 Dependence on renal dialysis: Secondary | ICD-10-CM | POA: Diagnosis not present

## 2020-04-08 DIAGNOSIS — N2581 Secondary hyperparathyroidism of renal origin: Secondary | ICD-10-CM | POA: Diagnosis not present

## 2020-04-08 DIAGNOSIS — N186 End stage renal disease: Secondary | ICD-10-CM | POA: Diagnosis not present

## 2020-04-12 DIAGNOSIS — C641 Malignant neoplasm of right kidney, except renal pelvis: Secondary | ICD-10-CM | POA: Diagnosis not present

## 2020-04-13 DIAGNOSIS — Z992 Dependence on renal dialysis: Secondary | ICD-10-CM | POA: Diagnosis not present

## 2020-04-13 DIAGNOSIS — N186 End stage renal disease: Secondary | ICD-10-CM | POA: Diagnosis not present

## 2020-04-13 DIAGNOSIS — N2581 Secondary hyperparathyroidism of renal origin: Secondary | ICD-10-CM | POA: Diagnosis not present

## 2020-04-15 DIAGNOSIS — N2581 Secondary hyperparathyroidism of renal origin: Secondary | ICD-10-CM | POA: Diagnosis not present

## 2020-04-15 DIAGNOSIS — Z992 Dependence on renal dialysis: Secondary | ICD-10-CM | POA: Diagnosis not present

## 2020-04-15 DIAGNOSIS — N186 End stage renal disease: Secondary | ICD-10-CM | POA: Diagnosis not present

## 2020-04-17 DIAGNOSIS — Z992 Dependence on renal dialysis: Secondary | ICD-10-CM | POA: Diagnosis not present

## 2020-04-17 DIAGNOSIS — N2581 Secondary hyperparathyroidism of renal origin: Secondary | ICD-10-CM | POA: Diagnosis not present

## 2020-04-17 DIAGNOSIS — N186 End stage renal disease: Secondary | ICD-10-CM | POA: Diagnosis not present

## 2020-04-19 DIAGNOSIS — N186 End stage renal disease: Secondary | ICD-10-CM | POA: Diagnosis not present

## 2020-04-19 DIAGNOSIS — Z992 Dependence on renal dialysis: Secondary | ICD-10-CM | POA: Diagnosis not present

## 2020-04-19 DIAGNOSIS — E1122 Type 2 diabetes mellitus with diabetic chronic kidney disease: Secondary | ICD-10-CM | POA: Diagnosis not present

## 2020-04-20 DIAGNOSIS — Z992 Dependence on renal dialysis: Secondary | ICD-10-CM | POA: Diagnosis not present

## 2020-04-20 DIAGNOSIS — N2581 Secondary hyperparathyroidism of renal origin: Secondary | ICD-10-CM | POA: Diagnosis not present

## 2020-04-20 DIAGNOSIS — N186 End stage renal disease: Secondary | ICD-10-CM | POA: Diagnosis not present

## 2020-04-20 DIAGNOSIS — E1122 Type 2 diabetes mellitus with diabetic chronic kidney disease: Secondary | ICD-10-CM | POA: Diagnosis not present

## 2020-04-22 DIAGNOSIS — N2581 Secondary hyperparathyroidism of renal origin: Secondary | ICD-10-CM | POA: Diagnosis not present

## 2020-04-22 DIAGNOSIS — N186 End stage renal disease: Secondary | ICD-10-CM | POA: Diagnosis not present

## 2020-04-22 DIAGNOSIS — Z992 Dependence on renal dialysis: Secondary | ICD-10-CM | POA: Diagnosis not present

## 2020-04-24 DIAGNOSIS — Z992 Dependence on renal dialysis: Secondary | ICD-10-CM | POA: Diagnosis not present

## 2020-04-24 DIAGNOSIS — N2581 Secondary hyperparathyroidism of renal origin: Secondary | ICD-10-CM | POA: Diagnosis not present

## 2020-04-24 DIAGNOSIS — N186 End stage renal disease: Secondary | ICD-10-CM | POA: Diagnosis not present

## 2020-04-27 DIAGNOSIS — N186 End stage renal disease: Secondary | ICD-10-CM | POA: Diagnosis not present

## 2020-04-27 DIAGNOSIS — N2581 Secondary hyperparathyroidism of renal origin: Secondary | ICD-10-CM | POA: Diagnosis not present

## 2020-04-27 DIAGNOSIS — Z992 Dependence on renal dialysis: Secondary | ICD-10-CM | POA: Diagnosis not present

## 2020-04-29 DIAGNOSIS — Z992 Dependence on renal dialysis: Secondary | ICD-10-CM | POA: Diagnosis not present

## 2020-04-29 DIAGNOSIS — N2581 Secondary hyperparathyroidism of renal origin: Secondary | ICD-10-CM | POA: Diagnosis not present

## 2020-04-29 DIAGNOSIS — N186 End stage renal disease: Secondary | ICD-10-CM | POA: Diagnosis not present

## 2020-05-01 DIAGNOSIS — N186 End stage renal disease: Secondary | ICD-10-CM | POA: Diagnosis not present

## 2020-05-01 DIAGNOSIS — N2581 Secondary hyperparathyroidism of renal origin: Secondary | ICD-10-CM | POA: Diagnosis not present

## 2020-05-01 DIAGNOSIS — Z992 Dependence on renal dialysis: Secondary | ICD-10-CM | POA: Diagnosis not present

## 2020-05-03 DIAGNOSIS — I5032 Chronic diastolic (congestive) heart failure: Secondary | ICD-10-CM | POA: Diagnosis not present

## 2020-05-03 DIAGNOSIS — E039 Hypothyroidism, unspecified: Secondary | ICD-10-CM | POA: Diagnosis not present

## 2020-05-03 DIAGNOSIS — N185 Chronic kidney disease, stage 5: Secondary | ICD-10-CM | POA: Diagnosis not present

## 2020-05-03 DIAGNOSIS — E782 Mixed hyperlipidemia: Secondary | ICD-10-CM | POA: Diagnosis not present

## 2020-05-03 DIAGNOSIS — E11649 Type 2 diabetes mellitus with hypoglycemia without coma: Secondary | ICD-10-CM | POA: Diagnosis not present

## 2020-05-03 DIAGNOSIS — D649 Anemia, unspecified: Secondary | ICD-10-CM | POA: Diagnosis not present

## 2020-05-03 DIAGNOSIS — N186 End stage renal disease: Secondary | ICD-10-CM | POA: Diagnosis not present

## 2020-05-03 DIAGNOSIS — E1122 Type 2 diabetes mellitus with diabetic chronic kidney disease: Secondary | ICD-10-CM | POA: Diagnosis not present

## 2020-05-03 DIAGNOSIS — I10 Essential (primary) hypertension: Secondary | ICD-10-CM | POA: Diagnosis not present

## 2020-05-04 DIAGNOSIS — N2581 Secondary hyperparathyroidism of renal origin: Secondary | ICD-10-CM | POA: Diagnosis not present

## 2020-05-04 DIAGNOSIS — Z992 Dependence on renal dialysis: Secondary | ICD-10-CM | POA: Diagnosis not present

## 2020-05-04 DIAGNOSIS — N186 End stage renal disease: Secondary | ICD-10-CM | POA: Diagnosis not present

## 2020-05-06 DIAGNOSIS — N186 End stage renal disease: Secondary | ICD-10-CM | POA: Diagnosis not present

## 2020-05-06 DIAGNOSIS — N2581 Secondary hyperparathyroidism of renal origin: Secondary | ICD-10-CM | POA: Diagnosis not present

## 2020-05-06 DIAGNOSIS — Z992 Dependence on renal dialysis: Secondary | ICD-10-CM | POA: Diagnosis not present

## 2020-05-08 DIAGNOSIS — N2581 Secondary hyperparathyroidism of renal origin: Secondary | ICD-10-CM | POA: Diagnosis not present

## 2020-05-08 DIAGNOSIS — N186 End stage renal disease: Secondary | ICD-10-CM | POA: Diagnosis not present

## 2020-05-08 DIAGNOSIS — Z992 Dependence on renal dialysis: Secondary | ICD-10-CM | POA: Diagnosis not present

## 2020-05-10 DIAGNOSIS — M109 Gout, unspecified: Secondary | ICD-10-CM | POA: Diagnosis not present

## 2020-05-10 DIAGNOSIS — E1122 Type 2 diabetes mellitus with diabetic chronic kidney disease: Secondary | ICD-10-CM | POA: Diagnosis not present

## 2020-05-10 DIAGNOSIS — Z4822 Encounter for aftercare following kidney transplant: Secondary | ICD-10-CM | POA: Diagnosis not present

## 2020-05-10 DIAGNOSIS — D472 Monoclonal gammopathy: Secondary | ICD-10-CM | POA: Diagnosis not present

## 2020-05-10 DIAGNOSIS — D631 Anemia in chronic kidney disease: Secondary | ICD-10-CM | POA: Diagnosis not present

## 2020-05-10 DIAGNOSIS — Z794 Long term (current) use of insulin: Secondary | ICD-10-CM | POA: Diagnosis not present

## 2020-05-10 DIAGNOSIS — R918 Other nonspecific abnormal finding of lung field: Secondary | ICD-10-CM | POA: Diagnosis not present

## 2020-05-10 DIAGNOSIS — Z85528 Personal history of other malignant neoplasm of kidney: Secondary | ICD-10-CM | POA: Diagnosis not present

## 2020-05-10 DIAGNOSIS — E669 Obesity, unspecified: Secondary | ICD-10-CM | POA: Diagnosis not present

## 2020-05-10 DIAGNOSIS — I808 Phlebitis and thrombophlebitis of other sites: Secondary | ICD-10-CM | POA: Diagnosis not present

## 2020-05-10 DIAGNOSIS — R7989 Other specified abnormal findings of blood chemistry: Secondary | ICD-10-CM | POA: Diagnosis not present

## 2020-05-10 DIAGNOSIS — Z992 Dependence on renal dialysis: Secondary | ICD-10-CM | POA: Diagnosis not present

## 2020-05-10 DIAGNOSIS — J90 Pleural effusion, not elsewhere classified: Secondary | ICD-10-CM | POA: Diagnosis not present

## 2020-05-10 DIAGNOSIS — E873 Alkalosis: Secondary | ICD-10-CM | POA: Diagnosis not present

## 2020-05-10 DIAGNOSIS — Z01818 Encounter for other preprocedural examination: Secondary | ICD-10-CM | POA: Diagnosis not present

## 2020-05-10 DIAGNOSIS — T8619 Other complication of kidney transplant: Secondary | ICD-10-CM | POA: Diagnosis not present

## 2020-05-10 DIAGNOSIS — Z20822 Contact with and (suspected) exposure to covid-19: Secondary | ICD-10-CM | POA: Diagnosis not present

## 2020-05-10 DIAGNOSIS — N2581 Secondary hyperparathyroidism of renal origin: Secondary | ICD-10-CM | POA: Diagnosis not present

## 2020-05-10 DIAGNOSIS — Z905 Acquired absence of kidney: Secondary | ICD-10-CM | POA: Diagnosis not present

## 2020-05-10 DIAGNOSIS — Z940 Kidney transplant status: Secondary | ICD-10-CM | POA: Diagnosis not present

## 2020-05-10 DIAGNOSIS — N186 End stage renal disease: Secondary | ICD-10-CM | POA: Diagnosis not present

## 2020-05-10 DIAGNOSIS — Z7952 Long term (current) use of systemic steroids: Secondary | ICD-10-CM | POA: Diagnosis not present

## 2020-05-10 DIAGNOSIS — E8770 Fluid overload, unspecified: Secondary | ICD-10-CM | POA: Diagnosis not present

## 2020-05-10 DIAGNOSIS — N269 Renal sclerosis, unspecified: Secondary | ICD-10-CM | POA: Diagnosis not present

## 2020-05-10 DIAGNOSIS — I498 Other specified cardiac arrhythmias: Secondary | ICD-10-CM | POA: Diagnosis not present

## 2020-05-10 DIAGNOSIS — I120 Hypertensive chronic kidney disease with stage 5 chronic kidney disease or end stage renal disease: Secondary | ICD-10-CM | POA: Diagnosis not present

## 2020-05-10 DIAGNOSIS — I10 Essential (primary) hypertension: Secondary | ICD-10-CM | POA: Diagnosis not present

## 2020-05-10 DIAGNOSIS — N185 Chronic kidney disease, stage 5: Secondary | ICD-10-CM | POA: Diagnosis not present

## 2020-05-10 DIAGNOSIS — N261 Atrophy of kidney (terminal): Secondary | ICD-10-CM | POA: Diagnosis not present

## 2020-05-10 DIAGNOSIS — D696 Thrombocytopenia, unspecified: Secondary | ICD-10-CM | POA: Diagnosis not present

## 2020-05-10 DIAGNOSIS — E1110 Type 2 diabetes mellitus with ketoacidosis without coma: Secondary | ICD-10-CM | POA: Diagnosis not present

## 2020-05-10 DIAGNOSIS — E119 Type 2 diabetes mellitus without complications: Secondary | ICD-10-CM | POA: Diagnosis not present

## 2020-05-10 DIAGNOSIS — Z01810 Encounter for preprocedural cardiovascular examination: Secondary | ICD-10-CM | POA: Diagnosis not present

## 2020-05-10 DIAGNOSIS — R768 Other specified abnormal immunological findings in serum: Secondary | ICD-10-CM | POA: Diagnosis not present

## 2020-05-10 DIAGNOSIS — Z79899 Other long term (current) drug therapy: Secondary | ICD-10-CM | POA: Diagnosis not present

## 2020-05-10 DIAGNOSIS — D84821 Immunodeficiency due to drugs: Secondary | ICD-10-CM | POA: Diagnosis not present

## 2020-05-11 DIAGNOSIS — Z940 Kidney transplant status: Secondary | ICD-10-CM | POA: Diagnosis not present

## 2020-05-11 DIAGNOSIS — D84821 Immunodeficiency due to drugs: Secondary | ICD-10-CM | POA: Diagnosis not present

## 2020-05-11 DIAGNOSIS — Z4822 Encounter for aftercare following kidney transplant: Secondary | ICD-10-CM | POA: Diagnosis not present

## 2020-05-11 DIAGNOSIS — Z79899 Other long term (current) drug therapy: Secondary | ICD-10-CM | POA: Diagnosis not present

## 2020-05-11 DIAGNOSIS — E119 Type 2 diabetes mellitus without complications: Secondary | ICD-10-CM | POA: Diagnosis not present

## 2020-05-11 DIAGNOSIS — Z7952 Long term (current) use of systemic steroids: Secondary | ICD-10-CM | POA: Diagnosis not present

## 2020-05-11 DIAGNOSIS — I10 Essential (primary) hypertension: Secondary | ICD-10-CM | POA: Diagnosis not present

## 2020-05-12 DIAGNOSIS — D84821 Immunodeficiency due to drugs: Secondary | ICD-10-CM | POA: Diagnosis not present

## 2020-05-12 DIAGNOSIS — T8619 Other complication of kidney transplant: Secondary | ICD-10-CM | POA: Diagnosis not present

## 2020-05-12 DIAGNOSIS — Z4822 Encounter for aftercare following kidney transplant: Secondary | ICD-10-CM | POA: Insufficient documentation

## 2020-05-12 DIAGNOSIS — Z79899 Other long term (current) drug therapy: Secondary | ICD-10-CM | POA: Diagnosis not present

## 2020-05-12 DIAGNOSIS — D849 Immunodeficiency, unspecified: Secondary | ICD-10-CM | POA: Insufficient documentation

## 2020-05-12 DIAGNOSIS — I10 Essential (primary) hypertension: Secondary | ICD-10-CM | POA: Diagnosis not present

## 2020-05-13 DIAGNOSIS — T8619 Other complication of kidney transplant: Secondary | ICD-10-CM | POA: Diagnosis not present

## 2020-05-13 DIAGNOSIS — Z4822 Encounter for aftercare following kidney transplant: Secondary | ICD-10-CM | POA: Diagnosis not present

## 2020-05-13 DIAGNOSIS — Z79899 Other long term (current) drug therapy: Secondary | ICD-10-CM | POA: Diagnosis not present

## 2020-05-13 DIAGNOSIS — Z7952 Long term (current) use of systemic steroids: Secondary | ICD-10-CM | POA: Diagnosis not present

## 2020-05-13 DIAGNOSIS — I10 Essential (primary) hypertension: Secondary | ICD-10-CM | POA: Diagnosis not present

## 2020-05-13 DIAGNOSIS — I808 Phlebitis and thrombophlebitis of other sites: Secondary | ICD-10-CM | POA: Diagnosis not present

## 2020-05-13 DIAGNOSIS — D84821 Immunodeficiency due to drugs: Secondary | ICD-10-CM | POA: Diagnosis not present

## 2020-05-13 DIAGNOSIS — E119 Type 2 diabetes mellitus without complications: Secondary | ICD-10-CM | POA: Diagnosis not present

## 2020-05-13 DIAGNOSIS — I498 Other specified cardiac arrhythmias: Secondary | ICD-10-CM | POA: Diagnosis not present

## 2020-05-14 DIAGNOSIS — T8619 Other complication of kidney transplant: Secondary | ICD-10-CM | POA: Diagnosis not present

## 2020-05-14 DIAGNOSIS — E8770 Fluid overload, unspecified: Secondary | ICD-10-CM | POA: Diagnosis not present

## 2020-05-14 DIAGNOSIS — Z79899 Other long term (current) drug therapy: Secondary | ICD-10-CM | POA: Diagnosis not present

## 2020-05-14 DIAGNOSIS — R7989 Other specified abnormal findings of blood chemistry: Secondary | ICD-10-CM | POA: Diagnosis not present

## 2020-05-14 DIAGNOSIS — I10 Essential (primary) hypertension: Secondary | ICD-10-CM | POA: Diagnosis not present

## 2020-05-14 DIAGNOSIS — N185 Chronic kidney disease, stage 5: Secondary | ICD-10-CM | POA: Diagnosis not present

## 2020-05-14 DIAGNOSIS — Z4822 Encounter for aftercare following kidney transplant: Secondary | ICD-10-CM | POA: Diagnosis not present

## 2020-05-14 DIAGNOSIS — E1122 Type 2 diabetes mellitus with diabetic chronic kidney disease: Secondary | ICD-10-CM | POA: Diagnosis not present

## 2020-05-14 DIAGNOSIS — D84821 Immunodeficiency due to drugs: Secondary | ICD-10-CM | POA: Diagnosis not present

## 2020-05-14 DIAGNOSIS — Z794 Long term (current) use of insulin: Secondary | ICD-10-CM | POA: Diagnosis not present

## 2020-05-14 DIAGNOSIS — Z7952 Long term (current) use of systemic steroids: Secondary | ICD-10-CM | POA: Diagnosis not present

## 2020-05-14 DIAGNOSIS — I120 Hypertensive chronic kidney disease with stage 5 chronic kidney disease or end stage renal disease: Secondary | ICD-10-CM | POA: Diagnosis not present

## 2020-05-17 DIAGNOSIS — D84821 Immunodeficiency due to drugs: Secondary | ICD-10-CM | POA: Diagnosis not present

## 2020-05-17 DIAGNOSIS — Z4822 Encounter for aftercare following kidney transplant: Secondary | ICD-10-CM | POA: Diagnosis not present

## 2020-05-17 DIAGNOSIS — E8770 Fluid overload, unspecified: Secondary | ICD-10-CM | POA: Diagnosis not present

## 2020-05-17 DIAGNOSIS — Z79899 Other long term (current) drug therapy: Secondary | ICD-10-CM | POA: Diagnosis not present

## 2020-05-17 DIAGNOSIS — Z940 Kidney transplant status: Secondary | ICD-10-CM | POA: Diagnosis not present

## 2020-05-17 DIAGNOSIS — I10 Essential (primary) hypertension: Secondary | ICD-10-CM | POA: Diagnosis not present

## 2020-05-17 DIAGNOSIS — Z7952 Long term (current) use of systemic steroids: Secondary | ICD-10-CM | POA: Diagnosis not present

## 2020-05-17 DIAGNOSIS — E119 Type 2 diabetes mellitus without complications: Secondary | ICD-10-CM | POA: Diagnosis not present

## 2020-05-17 DIAGNOSIS — T8619 Other complication of kidney transplant: Secondary | ICD-10-CM | POA: Diagnosis not present

## 2020-05-17 DIAGNOSIS — D649 Anemia, unspecified: Secondary | ICD-10-CM | POA: Diagnosis not present

## 2020-05-17 DIAGNOSIS — E785 Hyperlipidemia, unspecified: Secondary | ICD-10-CM | POA: Diagnosis not present

## 2020-05-19 DIAGNOSIS — Z792 Long term (current) use of antibiotics: Secondary | ICD-10-CM | POA: Diagnosis not present

## 2020-05-19 DIAGNOSIS — D849 Immunodeficiency, unspecified: Secondary | ICD-10-CM | POA: Diagnosis not present

## 2020-05-19 DIAGNOSIS — N186 End stage renal disease: Secondary | ICD-10-CM | POA: Diagnosis not present

## 2020-05-19 DIAGNOSIS — Z7952 Long term (current) use of systemic steroids: Secondary | ICD-10-CM | POA: Diagnosis not present

## 2020-05-19 DIAGNOSIS — Z940 Kidney transplant status: Secondary | ICD-10-CM | POA: Diagnosis not present

## 2020-05-19 DIAGNOSIS — Z5181 Encounter for therapeutic drug level monitoring: Secondary | ICD-10-CM | POA: Diagnosis not present

## 2020-05-19 DIAGNOSIS — D84821 Immunodeficiency due to drugs: Secondary | ICD-10-CM | POA: Diagnosis not present

## 2020-05-19 DIAGNOSIS — Z4822 Encounter for aftercare following kidney transplant: Secondary | ICD-10-CM | POA: Diagnosis not present

## 2020-05-19 DIAGNOSIS — Z79899 Other long term (current) drug therapy: Secondary | ICD-10-CM | POA: Diagnosis not present

## 2020-05-19 DIAGNOSIS — D649 Anemia, unspecified: Secondary | ICD-10-CM | POA: Diagnosis not present

## 2020-05-19 DIAGNOSIS — T8619 Other complication of kidney transplant: Secondary | ICD-10-CM | POA: Diagnosis not present

## 2020-05-19 DIAGNOSIS — Z992 Dependence on renal dialysis: Secondary | ICD-10-CM | POA: Diagnosis not present

## 2020-05-19 DIAGNOSIS — I10 Essential (primary) hypertension: Secondary | ICD-10-CM | POA: Diagnosis not present

## 2020-05-19 DIAGNOSIS — E119 Type 2 diabetes mellitus without complications: Secondary | ICD-10-CM | POA: Diagnosis not present

## 2020-05-19 DIAGNOSIS — N179 Acute kidney failure, unspecified: Secondary | ICD-10-CM | POA: Diagnosis not present

## 2020-05-21 DIAGNOSIS — Z7952 Long term (current) use of systemic steroids: Secondary | ICD-10-CM | POA: Diagnosis not present

## 2020-05-21 DIAGNOSIS — N186 End stage renal disease: Secondary | ICD-10-CM | POA: Diagnosis not present

## 2020-05-21 DIAGNOSIS — I10 Essential (primary) hypertension: Secondary | ICD-10-CM | POA: Diagnosis not present

## 2020-05-21 DIAGNOSIS — R739 Hyperglycemia, unspecified: Secondary | ICD-10-CM | POA: Diagnosis not present

## 2020-05-21 DIAGNOSIS — Z4822 Encounter for aftercare following kidney transplant: Secondary | ICD-10-CM | POA: Diagnosis not present

## 2020-05-21 DIAGNOSIS — Z79899 Other long term (current) drug therapy: Secondary | ICD-10-CM | POA: Diagnosis not present

## 2020-05-21 DIAGNOSIS — Z992 Dependence on renal dialysis: Secondary | ICD-10-CM | POA: Diagnosis not present

## 2020-05-21 DIAGNOSIS — E8770 Fluid overload, unspecified: Secondary | ICD-10-CM | POA: Diagnosis not present

## 2020-05-21 DIAGNOSIS — D631 Anemia in chronic kidney disease: Secondary | ICD-10-CM | POA: Diagnosis not present

## 2020-05-21 DIAGNOSIS — Z6841 Body Mass Index (BMI) 40.0 and over, adult: Secondary | ICD-10-CM | POA: Diagnosis not present

## 2020-05-21 DIAGNOSIS — D696 Thrombocytopenia, unspecified: Secondary | ICD-10-CM | POA: Diagnosis not present

## 2020-05-21 DIAGNOSIS — E669 Obesity, unspecified: Secondary | ICD-10-CM | POA: Diagnosis not present

## 2020-05-21 DIAGNOSIS — T8619 Other complication of kidney transplant: Secondary | ICD-10-CM | POA: Diagnosis not present

## 2020-05-21 DIAGNOSIS — I120 Hypertensive chronic kidney disease with stage 5 chronic kidney disease or end stage renal disease: Secondary | ICD-10-CM | POA: Diagnosis not present

## 2020-05-24 DIAGNOSIS — I151 Hypertension secondary to other renal disorders: Secondary | ICD-10-CM | POA: Diagnosis not present

## 2020-05-24 DIAGNOSIS — N2889 Other specified disorders of kidney and ureter: Secondary | ICD-10-CM | POA: Diagnosis not present

## 2020-05-24 DIAGNOSIS — E785 Hyperlipidemia, unspecified: Secondary | ICD-10-CM | POA: Diagnosis not present

## 2020-05-24 DIAGNOSIS — E119 Type 2 diabetes mellitus without complications: Secondary | ICD-10-CM | POA: Diagnosis not present

## 2020-05-24 DIAGNOSIS — I10 Essential (primary) hypertension: Secondary | ICD-10-CM | POA: Diagnosis not present

## 2020-05-24 DIAGNOSIS — Z79899 Other long term (current) drug therapy: Secondary | ICD-10-CM | POA: Diagnosis not present

## 2020-05-24 DIAGNOSIS — Z4822 Encounter for aftercare following kidney transplant: Secondary | ICD-10-CM | POA: Diagnosis not present

## 2020-05-24 DIAGNOSIS — Z794 Long term (current) use of insulin: Secondary | ICD-10-CM | POA: Diagnosis not present

## 2020-05-24 DIAGNOSIS — D649 Anemia, unspecified: Secondary | ICD-10-CM | POA: Diagnosis not present

## 2020-05-24 DIAGNOSIS — Z5181 Encounter for therapeutic drug level monitoring: Secondary | ICD-10-CM | POA: Diagnosis not present

## 2020-05-24 DIAGNOSIS — D849 Immunodeficiency, unspecified: Secondary | ICD-10-CM | POA: Diagnosis not present

## 2020-05-24 DIAGNOSIS — T8619 Other complication of kidney transplant: Secondary | ICD-10-CM | POA: Diagnosis not present

## 2020-05-24 DIAGNOSIS — Z940 Kidney transplant status: Secondary | ICD-10-CM | POA: Diagnosis not present

## 2020-05-26 DIAGNOSIS — Z940 Kidney transplant status: Secondary | ICD-10-CM | POA: Diagnosis not present

## 2020-05-26 DIAGNOSIS — T8619 Other complication of kidney transplant: Secondary | ICD-10-CM | POA: Diagnosis not present

## 2020-05-26 DIAGNOSIS — D84821 Immunodeficiency due to drugs: Secondary | ICD-10-CM | POA: Diagnosis not present

## 2020-05-26 DIAGNOSIS — Z794 Long term (current) use of insulin: Secondary | ICD-10-CM | POA: Diagnosis not present

## 2020-05-26 DIAGNOSIS — Z4822 Encounter for aftercare following kidney transplant: Secondary | ICD-10-CM | POA: Diagnosis not present

## 2020-05-26 DIAGNOSIS — E785 Hyperlipidemia, unspecified: Secondary | ICD-10-CM | POA: Diagnosis not present

## 2020-05-26 DIAGNOSIS — Z5181 Encounter for therapeutic drug level monitoring: Secondary | ICD-10-CM | POA: Diagnosis not present

## 2020-05-26 DIAGNOSIS — Z79899 Other long term (current) drug therapy: Secondary | ICD-10-CM | POA: Diagnosis not present

## 2020-05-26 DIAGNOSIS — D649 Anemia, unspecified: Secondary | ICD-10-CM | POA: Diagnosis not present

## 2020-05-26 DIAGNOSIS — I10 Essential (primary) hypertension: Secondary | ICD-10-CM | POA: Diagnosis not present

## 2020-05-26 DIAGNOSIS — E119 Type 2 diabetes mellitus without complications: Secondary | ICD-10-CM | POA: Diagnosis not present

## 2020-05-26 DIAGNOSIS — E1165 Type 2 diabetes mellitus with hyperglycemia: Secondary | ICD-10-CM | POA: Diagnosis not present

## 2020-05-28 DIAGNOSIS — D84821 Immunodeficiency due to drugs: Secondary | ICD-10-CM | POA: Diagnosis not present

## 2020-05-28 DIAGNOSIS — Z79899 Other long term (current) drug therapy: Secondary | ICD-10-CM | POA: Diagnosis not present

## 2020-05-28 DIAGNOSIS — I10 Essential (primary) hypertension: Secondary | ICD-10-CM | POA: Diagnosis not present

## 2020-05-28 DIAGNOSIS — Z4822 Encounter for aftercare following kidney transplant: Secondary | ICD-10-CM | POA: Diagnosis not present

## 2020-05-28 DIAGNOSIS — Z940 Kidney transplant status: Secondary | ICD-10-CM | POA: Diagnosis not present

## 2020-05-28 DIAGNOSIS — E1165 Type 2 diabetes mellitus with hyperglycemia: Secondary | ICD-10-CM | POA: Diagnosis not present

## 2020-05-28 DIAGNOSIS — E785 Hyperlipidemia, unspecified: Secondary | ICD-10-CM | POA: Diagnosis not present

## 2020-05-28 DIAGNOSIS — D649 Anemia, unspecified: Secondary | ICD-10-CM | POA: Diagnosis not present

## 2020-05-28 DIAGNOSIS — Z5181 Encounter for therapeutic drug level monitoring: Secondary | ICD-10-CM | POA: Diagnosis not present

## 2020-05-28 DIAGNOSIS — D849 Immunodeficiency, unspecified: Secondary | ICD-10-CM | POA: Diagnosis not present

## 2020-05-28 DIAGNOSIS — R0989 Other specified symptoms and signs involving the circulatory and respiratory systems: Secondary | ICD-10-CM | POA: Diagnosis not present

## 2020-05-31 DIAGNOSIS — Z794 Long term (current) use of insulin: Secondary | ICD-10-CM | POA: Diagnosis not present

## 2020-05-31 DIAGNOSIS — Z4822 Encounter for aftercare following kidney transplant: Secondary | ICD-10-CM | POA: Diagnosis not present

## 2020-05-31 DIAGNOSIS — D849 Immunodeficiency, unspecified: Secondary | ICD-10-CM | POA: Diagnosis not present

## 2020-05-31 DIAGNOSIS — D649 Anemia, unspecified: Secondary | ICD-10-CM | POA: Diagnosis not present

## 2020-05-31 DIAGNOSIS — Z79899 Other long term (current) drug therapy: Secondary | ICD-10-CM | POA: Diagnosis not present

## 2020-05-31 DIAGNOSIS — Z5181 Encounter for therapeutic drug level monitoring: Secondary | ICD-10-CM | POA: Diagnosis not present

## 2020-05-31 DIAGNOSIS — T8619 Other complication of kidney transplant: Secondary | ICD-10-CM | POA: Diagnosis not present

## 2020-05-31 DIAGNOSIS — E1165 Type 2 diabetes mellitus with hyperglycemia: Secondary | ICD-10-CM | POA: Diagnosis not present

## 2020-05-31 DIAGNOSIS — E785 Hyperlipidemia, unspecified: Secondary | ICD-10-CM | POA: Diagnosis not present

## 2020-05-31 DIAGNOSIS — I151 Hypertension secondary to other renal disorders: Secondary | ICD-10-CM | POA: Diagnosis not present

## 2020-05-31 DIAGNOSIS — E1122 Type 2 diabetes mellitus with diabetic chronic kidney disease: Secondary | ICD-10-CM | POA: Diagnosis not present

## 2020-05-31 DIAGNOSIS — Z940 Kidney transplant status: Secondary | ICD-10-CM | POA: Diagnosis not present

## 2020-05-31 DIAGNOSIS — D631 Anemia in chronic kidney disease: Secondary | ICD-10-CM | POA: Diagnosis not present

## 2020-05-31 DIAGNOSIS — N189 Chronic kidney disease, unspecified: Secondary | ICD-10-CM | POA: Diagnosis not present

## 2020-05-31 DIAGNOSIS — I10 Essential (primary) hypertension: Secondary | ICD-10-CM | POA: Diagnosis not present

## 2020-06-03 DIAGNOSIS — D649 Anemia, unspecified: Secondary | ICD-10-CM | POA: Diagnosis not present

## 2020-06-03 DIAGNOSIS — Z4803 Encounter for change or removal of drains: Secondary | ICD-10-CM | POA: Diagnosis not present

## 2020-06-03 DIAGNOSIS — E1165 Type 2 diabetes mellitus with hyperglycemia: Secondary | ICD-10-CM | POA: Diagnosis not present

## 2020-06-03 DIAGNOSIS — I151 Hypertension secondary to other renal disorders: Secondary | ICD-10-CM | POA: Diagnosis not present

## 2020-06-03 DIAGNOSIS — Z5181 Encounter for therapeutic drug level monitoring: Secondary | ICD-10-CM | POA: Diagnosis not present

## 2020-06-03 DIAGNOSIS — Z940 Kidney transplant status: Secondary | ICD-10-CM | POA: Diagnosis not present

## 2020-06-03 DIAGNOSIS — N2889 Other specified disorders of kidney and ureter: Secondary | ICD-10-CM | POA: Diagnosis not present

## 2020-06-03 DIAGNOSIS — Z4822 Encounter for aftercare following kidney transplant: Secondary | ICD-10-CM | POA: Diagnosis not present

## 2020-06-03 DIAGNOSIS — L7632 Postprocedural hematoma of skin and subcutaneous tissue following other procedure: Secondary | ICD-10-CM | POA: Diagnosis not present

## 2020-06-03 DIAGNOSIS — T8619 Other complication of kidney transplant: Secondary | ICD-10-CM | POA: Diagnosis not present

## 2020-06-03 DIAGNOSIS — Z79899 Other long term (current) drug therapy: Secondary | ICD-10-CM | POA: Diagnosis not present

## 2020-06-04 DIAGNOSIS — E039 Hypothyroidism, unspecified: Secondary | ICD-10-CM | POA: Diagnosis not present

## 2020-06-04 DIAGNOSIS — N184 Chronic kidney disease, stage 4 (severe): Secondary | ICD-10-CM | POA: Diagnosis not present

## 2020-06-04 DIAGNOSIS — E11649 Type 2 diabetes mellitus with hypoglycemia without coma: Secondary | ICD-10-CM | POA: Diagnosis not present

## 2020-06-04 DIAGNOSIS — D649 Anemia, unspecified: Secondary | ICD-10-CM | POA: Diagnosis not present

## 2020-06-04 DIAGNOSIS — I5032 Chronic diastolic (congestive) heart failure: Secondary | ICD-10-CM | POA: Diagnosis not present

## 2020-06-04 DIAGNOSIS — N185 Chronic kidney disease, stage 5: Secondary | ICD-10-CM | POA: Diagnosis not present

## 2020-06-04 DIAGNOSIS — I10 Essential (primary) hypertension: Secondary | ICD-10-CM | POA: Diagnosis not present

## 2020-06-04 DIAGNOSIS — E1122 Type 2 diabetes mellitus with diabetic chronic kidney disease: Secondary | ICD-10-CM | POA: Diagnosis not present

## 2020-06-04 DIAGNOSIS — E782 Mixed hyperlipidemia: Secondary | ICD-10-CM | POA: Diagnosis not present

## 2020-06-08 DIAGNOSIS — E213 Hyperparathyroidism, unspecified: Secondary | ICD-10-CM | POA: Insufficient documentation

## 2020-06-08 DIAGNOSIS — Z466 Encounter for fitting and adjustment of urinary device: Secondary | ICD-10-CM | POA: Diagnosis not present

## 2020-06-08 DIAGNOSIS — Z940 Kidney transplant status: Secondary | ICD-10-CM | POA: Diagnosis not present

## 2020-06-08 DIAGNOSIS — E1122 Type 2 diabetes mellitus with diabetic chronic kidney disease: Secondary | ICD-10-CM | POA: Diagnosis not present

## 2020-06-08 DIAGNOSIS — Z5181 Encounter for therapeutic drug level monitoring: Secondary | ICD-10-CM | POA: Diagnosis not present

## 2020-06-08 DIAGNOSIS — E039 Hypothyroidism, unspecified: Secondary | ICD-10-CM | POA: Diagnosis not present

## 2020-06-08 DIAGNOSIS — E1165 Type 2 diabetes mellitus with hyperglycemia: Secondary | ICD-10-CM | POA: Diagnosis not present

## 2020-06-08 DIAGNOSIS — D849 Immunodeficiency, unspecified: Secondary | ICD-10-CM | POA: Diagnosis not present

## 2020-06-08 DIAGNOSIS — N186 End stage renal disease: Secondary | ICD-10-CM | POA: Diagnosis not present

## 2020-06-08 DIAGNOSIS — Z85528 Personal history of other malignant neoplasm of kidney: Secondary | ICD-10-CM | POA: Diagnosis not present

## 2020-06-08 DIAGNOSIS — T8619 Other complication of kidney transplant: Secondary | ICD-10-CM | POA: Diagnosis not present

## 2020-06-08 DIAGNOSIS — I120 Hypertensive chronic kidney disease with stage 5 chronic kidney disease or end stage renal disease: Secondary | ICD-10-CM | POA: Diagnosis not present

## 2020-06-08 DIAGNOSIS — Z4822 Encounter for aftercare following kidney transplant: Secondary | ICD-10-CM | POA: Diagnosis not present

## 2020-06-08 DIAGNOSIS — E0590 Thyrotoxicosis, unspecified without thyrotoxic crisis or storm: Secondary | ICD-10-CM | POA: Diagnosis not present

## 2020-06-08 DIAGNOSIS — Z79899 Other long term (current) drug therapy: Secondary | ICD-10-CM | POA: Diagnosis not present

## 2020-06-08 DIAGNOSIS — N2889 Other specified disorders of kidney and ureter: Secondary | ICD-10-CM | POA: Diagnosis not present

## 2020-06-08 DIAGNOSIS — E785 Hyperlipidemia, unspecified: Secondary | ICD-10-CM | POA: Diagnosis not present

## 2020-06-08 DIAGNOSIS — D649 Anemia, unspecified: Secondary | ICD-10-CM | POA: Diagnosis not present

## 2020-06-08 DIAGNOSIS — D631 Anemia in chronic kidney disease: Secondary | ICD-10-CM | POA: Diagnosis not present

## 2020-06-08 DIAGNOSIS — I151 Hypertension secondary to other renal disorders: Secondary | ICD-10-CM | POA: Diagnosis not present

## 2020-06-10 DIAGNOSIS — Z79899 Other long term (current) drug therapy: Secondary | ICD-10-CM | POA: Diagnosis not present

## 2020-06-10 DIAGNOSIS — R944 Abnormal results of kidney function studies: Secondary | ICD-10-CM | POA: Diagnosis not present

## 2020-06-10 DIAGNOSIS — T8619 Other complication of kidney transplant: Secondary | ICD-10-CM | POA: Diagnosis not present

## 2020-06-10 DIAGNOSIS — T8611 Kidney transplant rejection: Secondary | ICD-10-CM | POA: Diagnosis not present

## 2020-06-10 DIAGNOSIS — Z5181 Encounter for therapeutic drug level monitoring: Secondary | ICD-10-CM | POA: Diagnosis not present

## 2020-06-10 DIAGNOSIS — Z940 Kidney transplant status: Secondary | ICD-10-CM | POA: Diagnosis not present

## 2020-06-10 DIAGNOSIS — D849 Immunodeficiency, unspecified: Secondary | ICD-10-CM | POA: Diagnosis not present

## 2020-06-10 DIAGNOSIS — Z4822 Encounter for aftercare following kidney transplant: Secondary | ICD-10-CM | POA: Diagnosis not present

## 2020-06-10 DIAGNOSIS — Z9889 Other specified postprocedural states: Secondary | ICD-10-CM | POA: Diagnosis not present

## 2020-06-14 DIAGNOSIS — E213 Hyperparathyroidism, unspecified: Secondary | ICD-10-CM | POA: Diagnosis not present

## 2020-06-14 DIAGNOSIS — Z794 Long term (current) use of insulin: Secondary | ICD-10-CM | POA: Diagnosis not present

## 2020-06-14 DIAGNOSIS — Z940 Kidney transplant status: Secondary | ICD-10-CM | POA: Diagnosis not present

## 2020-06-14 DIAGNOSIS — I10 Essential (primary) hypertension: Secondary | ICD-10-CM | POA: Diagnosis not present

## 2020-06-14 DIAGNOSIS — T8189XA Other complications of procedures, not elsewhere classified, initial encounter: Secondary | ICD-10-CM | POA: Diagnosis not present

## 2020-06-14 DIAGNOSIS — Z5181 Encounter for therapeutic drug level monitoring: Secondary | ICD-10-CM | POA: Diagnosis not present

## 2020-06-14 DIAGNOSIS — D849 Immunodeficiency, unspecified: Secondary | ICD-10-CM | POA: Diagnosis not present

## 2020-06-14 DIAGNOSIS — D649 Anemia, unspecified: Secondary | ICD-10-CM | POA: Diagnosis not present

## 2020-06-14 DIAGNOSIS — E785 Hyperlipidemia, unspecified: Secondary | ICD-10-CM | POA: Diagnosis not present

## 2020-06-14 DIAGNOSIS — E039 Hypothyroidism, unspecified: Secondary | ICD-10-CM | POA: Diagnosis not present

## 2020-06-14 DIAGNOSIS — N1330 Unspecified hydronephrosis: Secondary | ICD-10-CM | POA: Diagnosis not present

## 2020-06-14 DIAGNOSIS — E1165 Type 2 diabetes mellitus with hyperglycemia: Secondary | ICD-10-CM | POA: Diagnosis not present

## 2020-06-14 DIAGNOSIS — Z79899 Other long term (current) drug therapy: Secondary | ICD-10-CM | POA: Diagnosis not present

## 2020-06-14 DIAGNOSIS — I151 Hypertension secondary to other renal disorders: Secondary | ICD-10-CM | POA: Diagnosis not present

## 2020-06-14 DIAGNOSIS — T8619 Other complication of kidney transplant: Secondary | ICD-10-CM | POA: Diagnosis not present

## 2020-06-14 DIAGNOSIS — Z4822 Encounter for aftercare following kidney transplant: Secondary | ICD-10-CM | POA: Diagnosis not present

## 2020-06-21 DIAGNOSIS — N2889 Other specified disorders of kidney and ureter: Secondary | ICD-10-CM | POA: Diagnosis not present

## 2020-06-21 DIAGNOSIS — N1330 Unspecified hydronephrosis: Secondary | ICD-10-CM | POA: Diagnosis not present

## 2020-06-21 DIAGNOSIS — Z794 Long term (current) use of insulin: Secondary | ICD-10-CM | POA: Diagnosis not present

## 2020-06-21 DIAGNOSIS — Z940 Kidney transplant status: Secondary | ICD-10-CM | POA: Diagnosis not present

## 2020-06-21 DIAGNOSIS — I10 Essential (primary) hypertension: Secondary | ICD-10-CM | POA: Diagnosis not present

## 2020-06-21 DIAGNOSIS — D849 Immunodeficiency, unspecified: Secondary | ICD-10-CM | POA: Diagnosis not present

## 2020-06-21 DIAGNOSIS — E1122 Type 2 diabetes mellitus with diabetic chronic kidney disease: Secondary | ICD-10-CM | POA: Diagnosis not present

## 2020-06-21 DIAGNOSIS — T8619 Other complication of kidney transplant: Secondary | ICD-10-CM | POA: Diagnosis not present

## 2020-06-28 DIAGNOSIS — Z7952 Long term (current) use of systemic steroids: Secondary | ICD-10-CM | POA: Diagnosis not present

## 2020-06-28 DIAGNOSIS — Z79899 Other long term (current) drug therapy: Secondary | ICD-10-CM | POA: Diagnosis not present

## 2020-06-28 DIAGNOSIS — E212 Other hyperparathyroidism: Secondary | ICD-10-CM | POA: Diagnosis not present

## 2020-06-28 DIAGNOSIS — I10 Essential (primary) hypertension: Secondary | ICD-10-CM | POA: Diagnosis not present

## 2020-06-28 DIAGNOSIS — Z940 Kidney transplant status: Secondary | ICD-10-CM | POA: Diagnosis not present

## 2020-06-28 DIAGNOSIS — Z792 Long term (current) use of antibiotics: Secondary | ICD-10-CM | POA: Diagnosis not present

## 2020-06-28 DIAGNOSIS — L7634 Postprocedural seroma of skin and subcutaneous tissue following other procedure: Secondary | ICD-10-CM | POA: Diagnosis not present

## 2020-06-28 DIAGNOSIS — Z4822 Encounter for aftercare following kidney transplant: Secondary | ICD-10-CM | POA: Diagnosis not present

## 2020-06-28 DIAGNOSIS — E039 Hypothyroidism, unspecified: Secondary | ICD-10-CM | POA: Diagnosis not present

## 2020-06-28 DIAGNOSIS — E785 Hyperlipidemia, unspecified: Secondary | ICD-10-CM | POA: Diagnosis not present

## 2020-06-28 DIAGNOSIS — E1122 Type 2 diabetes mellitus with diabetic chronic kidney disease: Secondary | ICD-10-CM | POA: Diagnosis not present

## 2020-06-28 DIAGNOSIS — Z794 Long term (current) use of insulin: Secondary | ICD-10-CM | POA: Diagnosis not present

## 2020-06-28 DIAGNOSIS — D649 Anemia, unspecified: Secondary | ICD-10-CM | POA: Diagnosis not present

## 2020-06-28 DIAGNOSIS — D8989 Other specified disorders involving the immune mechanism, not elsewhere classified: Secondary | ICD-10-CM | POA: Diagnosis not present

## 2020-06-28 DIAGNOSIS — I120 Hypertensive chronic kidney disease with stage 5 chronic kidney disease or end stage renal disease: Secondary | ICD-10-CM | POA: Diagnosis not present

## 2020-06-28 DIAGNOSIS — E119 Type 2 diabetes mellitus without complications: Secondary | ICD-10-CM | POA: Diagnosis not present

## 2020-07-05 DIAGNOSIS — Z5181 Encounter for therapeutic drug level monitoring: Secondary | ICD-10-CM | POA: Diagnosis not present

## 2020-07-05 DIAGNOSIS — Z4822 Encounter for aftercare following kidney transplant: Secondary | ICD-10-CM | POA: Diagnosis not present

## 2020-07-05 DIAGNOSIS — Z79899 Other long term (current) drug therapy: Secondary | ICD-10-CM | POA: Diagnosis not present

## 2020-07-07 DIAGNOSIS — E782 Mixed hyperlipidemia: Secondary | ICD-10-CM | POA: Diagnosis not present

## 2020-07-07 DIAGNOSIS — E1122 Type 2 diabetes mellitus with diabetic chronic kidney disease: Secondary | ICD-10-CM | POA: Diagnosis not present

## 2020-07-07 DIAGNOSIS — N185 Chronic kidney disease, stage 5: Secondary | ICD-10-CM | POA: Diagnosis not present

## 2020-07-07 DIAGNOSIS — N186 End stage renal disease: Secondary | ICD-10-CM | POA: Diagnosis not present

## 2020-07-07 DIAGNOSIS — E11649 Type 2 diabetes mellitus with hypoglycemia without coma: Secondary | ICD-10-CM | POA: Diagnosis not present

## 2020-07-07 DIAGNOSIS — I5032 Chronic diastolic (congestive) heart failure: Secondary | ICD-10-CM | POA: Diagnosis not present

## 2020-07-07 DIAGNOSIS — D649 Anemia, unspecified: Secondary | ICD-10-CM | POA: Diagnosis not present

## 2020-07-07 DIAGNOSIS — E039 Hypothyroidism, unspecified: Secondary | ICD-10-CM | POA: Diagnosis not present

## 2020-07-07 DIAGNOSIS — I10 Essential (primary) hypertension: Secondary | ICD-10-CM | POA: Diagnosis not present

## 2020-07-21 DIAGNOSIS — I10 Essential (primary) hypertension: Secondary | ICD-10-CM | POA: Diagnosis not present

## 2020-07-21 DIAGNOSIS — E785 Hyperlipidemia, unspecified: Secondary | ICD-10-CM | POA: Diagnosis not present

## 2020-07-21 DIAGNOSIS — Z794 Long term (current) use of insulin: Secondary | ICD-10-CM | POA: Diagnosis not present

## 2020-07-21 DIAGNOSIS — N186 End stage renal disease: Secondary | ICD-10-CM | POA: Diagnosis not present

## 2020-07-21 DIAGNOSIS — Z4822 Encounter for aftercare following kidney transplant: Secondary | ICD-10-CM | POA: Diagnosis not present

## 2020-07-21 DIAGNOSIS — D8989 Other specified disorders involving the immune mechanism, not elsewhere classified: Secondary | ICD-10-CM | POA: Diagnosis not present

## 2020-07-21 DIAGNOSIS — E119 Type 2 diabetes mellitus without complications: Secondary | ICD-10-CM | POA: Diagnosis not present

## 2020-07-21 DIAGNOSIS — E039 Hypothyroidism, unspecified: Secondary | ICD-10-CM | POA: Diagnosis not present

## 2020-07-21 DIAGNOSIS — Z902 Acquired absence of lung [part of]: Secondary | ICD-10-CM | POA: Diagnosis not present

## 2020-07-21 DIAGNOSIS — I120 Hypertensive chronic kidney disease with stage 5 chronic kidney disease or end stage renal disease: Secondary | ICD-10-CM | POA: Diagnosis not present

## 2020-07-21 DIAGNOSIS — Z940 Kidney transplant status: Secondary | ICD-10-CM | POA: Diagnosis not present

## 2020-07-21 DIAGNOSIS — Z792 Long term (current) use of antibiotics: Secondary | ICD-10-CM | POA: Diagnosis not present

## 2020-07-21 DIAGNOSIS — Z79899 Other long term (current) drug therapy: Secondary | ICD-10-CM | POA: Diagnosis not present

## 2020-07-21 DIAGNOSIS — E1122 Type 2 diabetes mellitus with diabetic chronic kidney disease: Secondary | ICD-10-CM | POA: Diagnosis not present

## 2020-07-21 DIAGNOSIS — Z85528 Personal history of other malignant neoplasm of kidney: Secondary | ICD-10-CM | POA: Diagnosis not present

## 2020-07-21 DIAGNOSIS — Z7952 Long term (current) use of systemic steroids: Secondary | ICD-10-CM | POA: Diagnosis not present

## 2020-07-21 DIAGNOSIS — D649 Anemia, unspecified: Secondary | ICD-10-CM | POA: Diagnosis not present

## 2020-07-26 DIAGNOSIS — E1165 Type 2 diabetes mellitus with hyperglycemia: Secondary | ICD-10-CM | POA: Diagnosis not present

## 2020-08-04 DIAGNOSIS — I120 Hypertensive chronic kidney disease with stage 5 chronic kidney disease or end stage renal disease: Secondary | ICD-10-CM | POA: Diagnosis not present

## 2020-08-04 DIAGNOSIS — Z4822 Encounter for aftercare following kidney transplant: Secondary | ICD-10-CM | POA: Diagnosis not present

## 2020-08-04 DIAGNOSIS — Z978 Presence of other specified devices: Secondary | ICD-10-CM | POA: Insufficient documentation

## 2020-08-04 DIAGNOSIS — E039 Hypothyroidism, unspecified: Secondary | ICD-10-CM | POA: Diagnosis not present

## 2020-08-04 DIAGNOSIS — D849 Immunodeficiency, unspecified: Secondary | ICD-10-CM | POA: Diagnosis not present

## 2020-08-04 DIAGNOSIS — Z79621 Long term (current) use of calcineurin inhibitor: Secondary | ICD-10-CM | POA: Insufficient documentation

## 2020-08-04 DIAGNOSIS — E119 Type 2 diabetes mellitus without complications: Secondary | ICD-10-CM | POA: Diagnosis not present

## 2020-08-04 DIAGNOSIS — Z940 Kidney transplant status: Secondary | ICD-10-CM | POA: Diagnosis not present

## 2020-08-04 DIAGNOSIS — D8989 Other specified disorders involving the immune mechanism, not elsewhere classified: Secondary | ICD-10-CM | POA: Diagnosis not present

## 2020-08-04 DIAGNOSIS — E785 Hyperlipidemia, unspecified: Secondary | ICD-10-CM | POA: Diagnosis not present

## 2020-08-04 DIAGNOSIS — D649 Anemia, unspecified: Secondary | ICD-10-CM | POA: Diagnosis not present

## 2020-08-04 DIAGNOSIS — D631 Anemia in chronic kidney disease: Secondary | ICD-10-CM | POA: Diagnosis not present

## 2020-08-04 DIAGNOSIS — N186 End stage renal disease: Secondary | ICD-10-CM | POA: Diagnosis not present

## 2020-08-04 DIAGNOSIS — Z905 Acquired absence of kidney: Secondary | ICD-10-CM | POA: Diagnosis not present

## 2020-08-04 DIAGNOSIS — E1122 Type 2 diabetes mellitus with diabetic chronic kidney disease: Secondary | ICD-10-CM | POA: Diagnosis not present

## 2020-08-04 DIAGNOSIS — I10 Essential (primary) hypertension: Secondary | ICD-10-CM | POA: Diagnosis not present

## 2020-08-17 DIAGNOSIS — E039 Hypothyroidism, unspecified: Secondary | ICD-10-CM | POA: Diagnosis not present

## 2020-08-17 DIAGNOSIS — N185 Chronic kidney disease, stage 5: Secondary | ICD-10-CM | POA: Diagnosis not present

## 2020-08-17 DIAGNOSIS — E782 Mixed hyperlipidemia: Secondary | ICD-10-CM | POA: Diagnosis not present

## 2020-08-17 DIAGNOSIS — E1122 Type 2 diabetes mellitus with diabetic chronic kidney disease: Secondary | ICD-10-CM | POA: Diagnosis not present

## 2020-08-17 DIAGNOSIS — I10 Essential (primary) hypertension: Secondary | ICD-10-CM | POA: Diagnosis not present

## 2020-08-17 DIAGNOSIS — D649 Anemia, unspecified: Secondary | ICD-10-CM | POA: Diagnosis not present

## 2020-08-17 DIAGNOSIS — I5032 Chronic diastolic (congestive) heart failure: Secondary | ICD-10-CM | POA: Diagnosis not present

## 2020-08-17 DIAGNOSIS — N184 Chronic kidney disease, stage 4 (severe): Secondary | ICD-10-CM | POA: Diagnosis not present

## 2020-08-17 DIAGNOSIS — E11649 Type 2 diabetes mellitus with hypoglycemia without coma: Secondary | ICD-10-CM | POA: Diagnosis not present

## 2020-08-18 DIAGNOSIS — D849 Immunodeficiency, unspecified: Secondary | ICD-10-CM | POA: Diagnosis not present

## 2020-08-18 DIAGNOSIS — Z940 Kidney transplant status: Secondary | ICD-10-CM | POA: Diagnosis not present

## 2020-09-01 DIAGNOSIS — Z940 Kidney transplant status: Secondary | ICD-10-CM | POA: Diagnosis not present

## 2020-09-01 DIAGNOSIS — I10 Essential (primary) hypertension: Secondary | ICD-10-CM | POA: Diagnosis not present

## 2020-09-01 DIAGNOSIS — Z5181 Encounter for therapeutic drug level monitoring: Secondary | ICD-10-CM | POA: Diagnosis not present

## 2020-09-01 DIAGNOSIS — E119 Type 2 diabetes mellitus without complications: Secondary | ICD-10-CM | POA: Diagnosis not present

## 2020-09-01 DIAGNOSIS — E213 Hyperparathyroidism, unspecified: Secondary | ICD-10-CM | POA: Diagnosis not present

## 2020-09-01 DIAGNOSIS — D849 Immunodeficiency, unspecified: Secondary | ICD-10-CM | POA: Diagnosis not present

## 2020-09-01 DIAGNOSIS — Z79899 Other long term (current) drug therapy: Secondary | ICD-10-CM | POA: Diagnosis not present

## 2020-09-01 DIAGNOSIS — Z794 Long term (current) use of insulin: Secondary | ICD-10-CM | POA: Diagnosis not present

## 2020-09-01 DIAGNOSIS — Z4822 Encounter for aftercare following kidney transplant: Secondary | ICD-10-CM | POA: Diagnosis not present

## 2020-09-15 DIAGNOSIS — Z940 Kidney transplant status: Secondary | ICD-10-CM | POA: Diagnosis not present

## 2020-09-15 DIAGNOSIS — D849 Immunodeficiency, unspecified: Secondary | ICD-10-CM | POA: Diagnosis not present

## 2020-09-29 DIAGNOSIS — E119 Type 2 diabetes mellitus without complications: Secondary | ICD-10-CM | POA: Diagnosis not present

## 2020-09-29 DIAGNOSIS — D649 Anemia, unspecified: Secondary | ICD-10-CM | POA: Diagnosis not present

## 2020-09-29 DIAGNOSIS — E11649 Type 2 diabetes mellitus with hypoglycemia without coma: Secondary | ICD-10-CM | POA: Diagnosis not present

## 2020-09-29 DIAGNOSIS — E785 Hyperlipidemia, unspecified: Secondary | ICD-10-CM | POA: Diagnosis not present

## 2020-09-29 DIAGNOSIS — E039 Hypothyroidism, unspecified: Secondary | ICD-10-CM | POA: Diagnosis not present

## 2020-09-29 DIAGNOSIS — I10 Essential (primary) hypertension: Secondary | ICD-10-CM | POA: Diagnosis not present

## 2020-09-29 DIAGNOSIS — Z6838 Body mass index (BMI) 38.0-38.9, adult: Secondary | ICD-10-CM | POA: Diagnosis not present

## 2020-09-29 DIAGNOSIS — I151 Hypertension secondary to other renal disorders: Secondary | ICD-10-CM | POA: Diagnosis not present

## 2020-09-29 DIAGNOSIS — Z4822 Encounter for aftercare following kidney transplant: Secondary | ICD-10-CM | POA: Diagnosis not present

## 2020-09-29 DIAGNOSIS — R635 Abnormal weight gain: Secondary | ICD-10-CM | POA: Diagnosis not present

## 2020-09-29 DIAGNOSIS — Z79899 Other long term (current) drug therapy: Secondary | ICD-10-CM | POA: Diagnosis not present

## 2020-09-29 DIAGNOSIS — E213 Hyperparathyroidism, unspecified: Secondary | ICD-10-CM | POA: Diagnosis not present

## 2020-09-29 DIAGNOSIS — Z940 Kidney transplant status: Secondary | ICD-10-CM | POA: Diagnosis not present

## 2020-09-29 DIAGNOSIS — Z5181 Encounter for therapeutic drug level monitoring: Secondary | ICD-10-CM | POA: Diagnosis not present

## 2020-09-29 DIAGNOSIS — D849 Immunodeficiency, unspecified: Secondary | ICD-10-CM | POA: Diagnosis not present

## 2020-10-13 DIAGNOSIS — Z940 Kidney transplant status: Secondary | ICD-10-CM | POA: Diagnosis not present

## 2020-10-13 DIAGNOSIS — Z79899 Other long term (current) drug therapy: Secondary | ICD-10-CM | POA: Diagnosis not present

## 2020-10-15 DIAGNOSIS — E782 Mixed hyperlipidemia: Secondary | ICD-10-CM | POA: Diagnosis not present

## 2020-10-15 DIAGNOSIS — I10 Essential (primary) hypertension: Secondary | ICD-10-CM | POA: Diagnosis not present

## 2020-10-15 DIAGNOSIS — E039 Hypothyroidism, unspecified: Secondary | ICD-10-CM | POA: Diagnosis not present

## 2020-10-15 DIAGNOSIS — I5032 Chronic diastolic (congestive) heart failure: Secondary | ICD-10-CM | POA: Diagnosis not present

## 2020-10-15 DIAGNOSIS — N185 Chronic kidney disease, stage 5: Secondary | ICD-10-CM | POA: Diagnosis not present

## 2020-10-15 DIAGNOSIS — D649 Anemia, unspecified: Secondary | ICD-10-CM | POA: Diagnosis not present

## 2020-10-15 DIAGNOSIS — E11649 Type 2 diabetes mellitus with hypoglycemia without coma: Secondary | ICD-10-CM | POA: Diagnosis not present

## 2020-10-15 DIAGNOSIS — N186 End stage renal disease: Secondary | ICD-10-CM | POA: Diagnosis not present

## 2020-10-15 DIAGNOSIS — E1122 Type 2 diabetes mellitus with diabetic chronic kidney disease: Secondary | ICD-10-CM | POA: Diagnosis not present

## 2020-10-18 ENCOUNTER — Other Ambulatory Visit: Payer: Self-pay

## 2020-10-18 ENCOUNTER — Ambulatory Visit (HOSPITAL_COMMUNITY)
Admission: EM | Admit: 2020-10-18 | Discharge: 2020-10-18 | Disposition: A | Discharge status: Home / Self Care | Payer: Medicare HMO | Attending: Family Medicine | Admitting: Family Medicine

## 2020-10-18 ENCOUNTER — Encounter (HOSPITAL_COMMUNITY): Payer: Self-pay | Admitting: Emergency Medicine

## 2020-10-18 DIAGNOSIS — T162XXA Foreign body in left ear, initial encounter: Secondary | ICD-10-CM

## 2020-10-18 NOTE — ED Triage Notes (Signed)
Pt presents with pain in left ear. States fly flew in ear yesterday.

## 2020-10-20 NOTE — ED Provider Notes (Signed)
  [place]   [id number] 10/18/20 Arrival Time: 1004  ASSESSMENT & PLAN:  1. Acute foreign body of left ear canal, initial encounter    Using alligator forceps, foreign body gently removed from ear canal. No complications. She tolerated procedure well.  May f/u here as needed.  Reviewed expectations re: course of current medical issues. Questions answered. Outlined signs and symptoms indicating need for more acute intervention. Patient verbalized understanding. After Visit Summary given.   SUBJECTIVE: History from: patient.  Kristen English is a 65 y.o. female who presents with complaint of a fly possibly flying into her L hear; yesterday. "Feels like there's something still in there". No pain. Normal hearing. No bleeding from ear.   OBJECTIVE:  Vitals:   10/18/20 1108  BP: (!) 145/75  Pulse: 96  Resp: 17  Temp: 98.6 F (37 C)  TempSrc: Oral  SpO2: 96%     General appearance: alert Ear Canal: body and wing of fly in canal on the left TM: Left; normal; intact Neck: supple without LAD Skin: warm and dry Psychological: alert and cooperative; normal mood and affect  Allergies  Allergen Reactions   Vancomycin Shortness Of Breath    Initial reaction occurred at outpatient dialysis   Morphine And Related Itching    Past Medical History:  Diagnosis Date   Arthritis    Chronic kidney disease    protein in urine   Diabetes mellitus without complication (HCC)    Gout    Hypertension    Family History  Problem Relation Age of Onset   Heart attack Mother    Heart attack Father    Social History   Socioeconomic History   Marital status: Divorced    Spouse name: Not on file   Number of children: Not on file   Years of education: Not on file   Highest education level: Not on file  Occupational History   Not on file  Tobacco Use   Smoking status: Never   Smokeless tobacco: Never  Substance and Sexual Activity   Alcohol use: No   Drug use: No    Sexual activity: Not Currently  Other Topics Concern   Not on file  Social History Narrative   Not on file   Social Determinants of Health   Financial Resource Strain: Not on file  Food Insecurity: Not on file  Transportation Needs: Not on file  Physical Activity: Not on file  Stress: Not on file  Social Connections: Not on file  Intimate Partner Violence: Not on file             Vanessa Kick, MD 10/20/20 [phone]

## 2020-10-26 DIAGNOSIS — E1165 Type 2 diabetes mellitus with hyperglycemia: Secondary | ICD-10-CM | POA: Diagnosis not present

## 2020-10-27 DIAGNOSIS — I120 Hypertensive chronic kidney disease with stage 5 chronic kidney disease or end stage renal disease: Secondary | ICD-10-CM | POA: Diagnosis not present

## 2020-10-27 DIAGNOSIS — E039 Hypothyroidism, unspecified: Secondary | ICD-10-CM | POA: Diagnosis not present

## 2020-10-27 DIAGNOSIS — Z4822 Encounter for aftercare following kidney transplant: Secondary | ICD-10-CM | POA: Diagnosis not present

## 2020-10-27 DIAGNOSIS — Z992 Dependence on renal dialysis: Secondary | ICD-10-CM | POA: Diagnosis not present

## 2020-10-27 DIAGNOSIS — D649 Anemia, unspecified: Secondary | ICD-10-CM | POA: Diagnosis not present

## 2020-10-27 DIAGNOSIS — D849 Immunodeficiency, unspecified: Secondary | ICD-10-CM | POA: Diagnosis not present

## 2020-10-27 DIAGNOSIS — Z940 Kidney transplant status: Secondary | ICD-10-CM | POA: Diagnosis not present

## 2020-10-27 DIAGNOSIS — E785 Hyperlipidemia, unspecified: Secondary | ICD-10-CM | POA: Diagnosis not present

## 2020-10-27 DIAGNOSIS — N186 End stage renal disease: Secondary | ICD-10-CM | POA: Diagnosis not present

## 2020-10-27 DIAGNOSIS — E1122 Type 2 diabetes mellitus with diabetic chronic kidney disease: Secondary | ICD-10-CM | POA: Diagnosis not present

## 2020-10-27 DIAGNOSIS — E1165 Type 2 diabetes mellitus with hyperglycemia: Secondary | ICD-10-CM | POA: Diagnosis not present

## 2020-10-27 DIAGNOSIS — D72819 Decreased white blood cell count, unspecified: Secondary | ICD-10-CM | POA: Diagnosis not present

## 2020-10-27 DIAGNOSIS — I10 Essential (primary) hypertension: Secondary | ICD-10-CM | POA: Diagnosis not present

## 2020-11-10 DIAGNOSIS — Z1389 Encounter for screening for other disorder: Secondary | ICD-10-CM | POA: Diagnosis not present

## 2020-11-10 DIAGNOSIS — M109 Gout, unspecified: Secondary | ICD-10-CM | POA: Diagnosis not present

## 2020-11-10 DIAGNOSIS — Z0000 Encounter for general adult medical examination without abnormal findings: Secondary | ICD-10-CM | POA: Diagnosis not present

## 2020-11-10 DIAGNOSIS — D472 Monoclonal gammopathy: Secondary | ICD-10-CM | POA: Diagnosis not present

## 2020-11-10 DIAGNOSIS — E039 Hypothyroidism, unspecified: Secondary | ICD-10-CM | POA: Diagnosis not present

## 2020-11-10 DIAGNOSIS — E782 Mixed hyperlipidemia: Secondary | ICD-10-CM | POA: Diagnosis not present

## 2020-11-10 DIAGNOSIS — Z23 Encounter for immunization: Secondary | ICD-10-CM | POA: Diagnosis not present

## 2020-11-10 DIAGNOSIS — I5032 Chronic diastolic (congestive) heart failure: Secondary | ICD-10-CM | POA: Diagnosis not present

## 2020-11-10 DIAGNOSIS — D849 Immunodeficiency, unspecified: Secondary | ICD-10-CM | POA: Diagnosis not present

## 2020-11-10 DIAGNOSIS — E1122 Type 2 diabetes mellitus with diabetic chronic kidney disease: Secondary | ICD-10-CM | POA: Diagnosis not present

## 2020-11-10 DIAGNOSIS — I700 Atherosclerosis of aorta: Secondary | ICD-10-CM | POA: Diagnosis not present

## 2020-11-10 DIAGNOSIS — I10 Essential (primary) hypertension: Secondary | ICD-10-CM | POA: Diagnosis not present

## 2020-11-10 DIAGNOSIS — Z940 Kidney transplant status: Secondary | ICD-10-CM | POA: Diagnosis not present

## 2020-11-24 DIAGNOSIS — E039 Hypothyroidism, unspecified: Secondary | ICD-10-CM | POA: Diagnosis not present

## 2020-11-24 DIAGNOSIS — Z79899 Other long term (current) drug therapy: Secondary | ICD-10-CM | POA: Diagnosis not present

## 2020-11-24 DIAGNOSIS — Z5181 Encounter for therapeutic drug level monitoring: Secondary | ICD-10-CM | POA: Diagnosis not present

## 2020-11-24 DIAGNOSIS — E1165 Type 2 diabetes mellitus with hyperglycemia: Secondary | ICD-10-CM | POA: Diagnosis not present

## 2020-11-24 DIAGNOSIS — Z794 Long term (current) use of insulin: Secondary | ICD-10-CM | POA: Diagnosis not present

## 2020-11-24 DIAGNOSIS — D72819 Decreased white blood cell count, unspecified: Secondary | ICD-10-CM | POA: Diagnosis not present

## 2020-11-24 DIAGNOSIS — E669 Obesity, unspecified: Secondary | ICD-10-CM | POA: Diagnosis not present

## 2020-11-24 DIAGNOSIS — D649 Anemia, unspecified: Secondary | ICD-10-CM | POA: Diagnosis not present

## 2020-11-24 DIAGNOSIS — Z6838 Body mass index (BMI) 38.0-38.9, adult: Secondary | ICD-10-CM | POA: Diagnosis not present

## 2020-11-24 DIAGNOSIS — Z940 Kidney transplant status: Secondary | ICD-10-CM | POA: Diagnosis not present

## 2020-11-24 DIAGNOSIS — Z4822 Encounter for aftercare following kidney transplant: Secondary | ICD-10-CM | POA: Diagnosis not present

## 2020-11-24 DIAGNOSIS — I10 Essential (primary) hypertension: Secondary | ICD-10-CM | POA: Diagnosis not present

## 2020-11-24 DIAGNOSIS — D849 Immunodeficiency, unspecified: Secondary | ICD-10-CM | POA: Diagnosis not present

## 2020-12-01 DIAGNOSIS — N185 Chronic kidney disease, stage 5: Secondary | ICD-10-CM | POA: Diagnosis not present

## 2020-12-01 DIAGNOSIS — E1122 Type 2 diabetes mellitus with diabetic chronic kidney disease: Secondary | ICD-10-CM | POA: Diagnosis not present

## 2020-12-01 DIAGNOSIS — E782 Mixed hyperlipidemia: Secondary | ICD-10-CM | POA: Diagnosis not present

## 2020-12-01 DIAGNOSIS — E11649 Type 2 diabetes mellitus with hypoglycemia without coma: Secondary | ICD-10-CM | POA: Diagnosis not present

## 2020-12-01 DIAGNOSIS — I10 Essential (primary) hypertension: Secondary | ICD-10-CM | POA: Diagnosis not present

## 2020-12-01 DIAGNOSIS — N186 End stage renal disease: Secondary | ICD-10-CM | POA: Diagnosis not present

## 2020-12-01 DIAGNOSIS — E039 Hypothyroidism, unspecified: Secondary | ICD-10-CM | POA: Diagnosis not present

## 2020-12-01 DIAGNOSIS — N19 Unspecified kidney failure: Secondary | ICD-10-CM | POA: Diagnosis not present

## 2020-12-22 DIAGNOSIS — B259 Cytomegaloviral disease, unspecified: Secondary | ICD-10-CM | POA: Diagnosis not present

## 2020-12-22 DIAGNOSIS — I10 Essential (primary) hypertension: Secondary | ICD-10-CM | POA: Diagnosis not present

## 2020-12-22 DIAGNOSIS — Z940 Kidney transplant status: Secondary | ICD-10-CM | POA: Diagnosis not present

## 2020-12-22 DIAGNOSIS — Z4822 Encounter for aftercare following kidney transplant: Secondary | ICD-10-CM | POA: Diagnosis not present

## 2020-12-22 DIAGNOSIS — Z5181 Encounter for therapeutic drug level monitoring: Secondary | ICD-10-CM | POA: Diagnosis not present

## 2020-12-22 DIAGNOSIS — Z79621 Long term (current) use of calcineurin inhibitor: Secondary | ICD-10-CM | POA: Diagnosis not present

## 2020-12-22 DIAGNOSIS — D849 Immunodeficiency, unspecified: Secondary | ICD-10-CM | POA: Diagnosis not present

## 2021-01-02 DIAGNOSIS — B259 Cytomegaloviral disease, unspecified: Secondary | ICD-10-CM | POA: Insufficient documentation

## 2021-01-14 DIAGNOSIS — E1165 Type 2 diabetes mellitus with hyperglycemia: Secondary | ICD-10-CM | POA: Diagnosis not present

## 2021-01-19 DIAGNOSIS — B259 Cytomegaloviral disease, unspecified: Secondary | ICD-10-CM | POA: Diagnosis not present

## 2021-01-19 DIAGNOSIS — D849 Immunodeficiency, unspecified: Secondary | ICD-10-CM | POA: Diagnosis not present

## 2021-01-19 DIAGNOSIS — E669 Obesity, unspecified: Secondary | ICD-10-CM | POA: Diagnosis not present

## 2021-01-19 DIAGNOSIS — I10 Essential (primary) hypertension: Secondary | ICD-10-CM | POA: Diagnosis not present

## 2021-01-19 DIAGNOSIS — E039 Hypothyroidism, unspecified: Secondary | ICD-10-CM | POA: Diagnosis not present

## 2021-01-19 DIAGNOSIS — Z792 Long term (current) use of antibiotics: Secondary | ICD-10-CM | POA: Diagnosis not present

## 2021-01-19 DIAGNOSIS — I120 Hypertensive chronic kidney disease with stage 5 chronic kidney disease or end stage renal disease: Secondary | ICD-10-CM | POA: Diagnosis not present

## 2021-01-19 DIAGNOSIS — E119 Type 2 diabetes mellitus without complications: Secondary | ICD-10-CM | POA: Diagnosis not present

## 2021-01-19 DIAGNOSIS — T8619 Other complication of kidney transplant: Secondary | ICD-10-CM | POA: Diagnosis not present

## 2021-01-19 DIAGNOSIS — D8989 Other specified disorders involving the immune mechanism, not elsewhere classified: Secondary | ICD-10-CM | POA: Diagnosis not present

## 2021-01-19 DIAGNOSIS — D649 Anemia, unspecified: Secondary | ICD-10-CM | POA: Diagnosis not present

## 2021-01-19 DIAGNOSIS — E785 Hyperlipidemia, unspecified: Secondary | ICD-10-CM | POA: Diagnosis not present

## 2021-01-19 DIAGNOSIS — E1122 Type 2 diabetes mellitus with diabetic chronic kidney disease: Secondary | ICD-10-CM | POA: Diagnosis not present

## 2021-01-19 DIAGNOSIS — Z7952 Long term (current) use of systemic steroids: Secondary | ICD-10-CM | POA: Diagnosis not present

## 2021-01-19 DIAGNOSIS — Z4822 Encounter for aftercare following kidney transplant: Secondary | ICD-10-CM | POA: Diagnosis not present

## 2021-01-19 DIAGNOSIS — D84821 Immunodeficiency due to drugs: Secondary | ICD-10-CM | POA: Diagnosis not present

## 2021-01-19 DIAGNOSIS — B258 Other cytomegaloviral diseases: Secondary | ICD-10-CM | POA: Diagnosis not present

## 2021-02-16 DIAGNOSIS — Z5181 Encounter for therapeutic drug level monitoring: Secondary | ICD-10-CM | POA: Diagnosis not present

## 2021-02-16 DIAGNOSIS — Z792 Long term (current) use of antibiotics: Secondary | ICD-10-CM | POA: Diagnosis not present

## 2021-02-16 DIAGNOSIS — E119 Type 2 diabetes mellitus without complications: Secondary | ICD-10-CM | POA: Diagnosis not present

## 2021-02-16 DIAGNOSIS — D649 Anemia, unspecified: Secondary | ICD-10-CM | POA: Diagnosis not present

## 2021-02-16 DIAGNOSIS — Z8619 Personal history of other infectious and parasitic diseases: Secondary | ICD-10-CM | POA: Diagnosis not present

## 2021-02-16 DIAGNOSIS — Z794 Long term (current) use of insulin: Secondary | ICD-10-CM | POA: Diagnosis not present

## 2021-02-16 DIAGNOSIS — E11649 Type 2 diabetes mellitus with hypoglycemia without coma: Secondary | ICD-10-CM | POA: Diagnosis not present

## 2021-02-16 DIAGNOSIS — Z9889 Other specified postprocedural states: Secondary | ICD-10-CM | POA: Diagnosis not present

## 2021-02-16 DIAGNOSIS — D849 Immunodeficiency, unspecified: Secondary | ICD-10-CM | POA: Diagnosis not present

## 2021-02-16 DIAGNOSIS — I10 Essential (primary) hypertension: Secondary | ICD-10-CM | POA: Diagnosis not present

## 2021-02-16 DIAGNOSIS — Z940 Kidney transplant status: Secondary | ICD-10-CM | POA: Diagnosis not present

## 2021-02-16 DIAGNOSIS — Z4822 Encounter for aftercare following kidney transplant: Secondary | ICD-10-CM | POA: Diagnosis not present

## 2021-02-16 DIAGNOSIS — D702 Other drug-induced agranulocytosis: Secondary | ICD-10-CM | POA: Diagnosis not present

## 2021-02-16 DIAGNOSIS — Z79899 Other long term (current) drug therapy: Secondary | ICD-10-CM | POA: Diagnosis not present

## 2021-02-16 DIAGNOSIS — Z7952 Long term (current) use of systemic steroids: Secondary | ICD-10-CM | POA: Diagnosis not present

## 2021-03-07 DIAGNOSIS — H2513 Age-related nuclear cataract, bilateral: Secondary | ICD-10-CM | POA: Diagnosis not present

## 2021-03-07 DIAGNOSIS — H40033 Anatomical narrow angle, bilateral: Secondary | ICD-10-CM | POA: Diagnosis not present

## 2021-03-08 DIAGNOSIS — Z1231 Encounter for screening mammogram for malignant neoplasm of breast: Secondary | ICD-10-CM | POA: Diagnosis not present

## 2021-03-08 DIAGNOSIS — Z0100 Encounter for examination of eyes and vision without abnormal findings: Secondary | ICD-10-CM | POA: Diagnosis not present

## 2021-03-16 DIAGNOSIS — B259 Cytomegaloviral disease, unspecified: Secondary | ICD-10-CM | POA: Diagnosis not present

## 2021-03-16 DIAGNOSIS — D84821 Immunodeficiency due to drugs: Secondary | ICD-10-CM | POA: Diagnosis not present

## 2021-03-16 DIAGNOSIS — E11649 Type 2 diabetes mellitus with hypoglycemia without coma: Secondary | ICD-10-CM | POA: Diagnosis not present

## 2021-03-16 DIAGNOSIS — Z79899 Other long term (current) drug therapy: Secondary | ICD-10-CM | POA: Diagnosis not present

## 2021-03-16 DIAGNOSIS — Z4822 Encounter for aftercare following kidney transplant: Secondary | ICD-10-CM | POA: Diagnosis not present

## 2021-03-16 DIAGNOSIS — Z794 Long term (current) use of insulin: Secondary | ICD-10-CM | POA: Diagnosis not present

## 2021-03-16 DIAGNOSIS — D890 Polyclonal hypergammaglobulinemia: Secondary | ICD-10-CM | POA: Diagnosis not present

## 2021-03-16 DIAGNOSIS — Z79621 Long term (current) use of calcineurin inhibitor: Secondary | ICD-10-CM | POA: Diagnosis not present

## 2021-03-16 DIAGNOSIS — I10 Essential (primary) hypertension: Secondary | ICD-10-CM | POA: Diagnosis not present

## 2021-03-16 DIAGNOSIS — Z952 Presence of prosthetic heart valve: Secondary | ICD-10-CM | POA: Diagnosis not present

## 2021-03-16 DIAGNOSIS — Z6841 Body Mass Index (BMI) 40.0 and over, adult: Secondary | ICD-10-CM | POA: Diagnosis not present

## 2021-03-16 DIAGNOSIS — E785 Hyperlipidemia, unspecified: Secondary | ICD-10-CM | POA: Diagnosis not present

## 2021-03-16 DIAGNOSIS — E669 Obesity, unspecified: Secondary | ICD-10-CM | POA: Diagnosis not present

## 2021-03-16 DIAGNOSIS — Z7985 Long-term (current) use of injectable non-insulin antidiabetic drugs: Secondary | ICD-10-CM | POA: Diagnosis not present

## 2021-04-04 DIAGNOSIS — E1165 Type 2 diabetes mellitus with hyperglycemia: Secondary | ICD-10-CM | POA: Diagnosis not present

## 2021-04-08 DIAGNOSIS — I10 Essential (primary) hypertension: Secondary | ICD-10-CM | POA: Diagnosis not present

## 2021-04-08 DIAGNOSIS — N185 Chronic kidney disease, stage 5: Secondary | ICD-10-CM | POA: Diagnosis not present

## 2021-04-08 DIAGNOSIS — E782 Mixed hyperlipidemia: Secondary | ICD-10-CM | POA: Diagnosis not present

## 2021-04-08 DIAGNOSIS — E1122 Type 2 diabetes mellitus with diabetic chronic kidney disease: Secondary | ICD-10-CM | POA: Diagnosis not present

## 2021-04-13 DIAGNOSIS — Z794 Long term (current) use of insulin: Secondary | ICD-10-CM | POA: Diagnosis not present

## 2021-04-13 DIAGNOSIS — Z4822 Encounter for aftercare following kidney transplant: Secondary | ICD-10-CM | POA: Diagnosis not present

## 2021-04-13 DIAGNOSIS — I10 Essential (primary) hypertension: Secondary | ICD-10-CM | POA: Diagnosis not present

## 2021-04-13 DIAGNOSIS — D849 Immunodeficiency, unspecified: Secondary | ICD-10-CM | POA: Diagnosis not present

## 2021-04-13 DIAGNOSIS — E039 Hypothyroidism, unspecified: Secondary | ICD-10-CM | POA: Diagnosis not present

## 2021-04-13 DIAGNOSIS — E119 Type 2 diabetes mellitus without complications: Secondary | ICD-10-CM | POA: Diagnosis not present

## 2021-04-13 DIAGNOSIS — Z5181 Encounter for therapeutic drug level monitoring: Secondary | ICD-10-CM | POA: Diagnosis not present

## 2021-04-13 DIAGNOSIS — Z940 Kidney transplant status: Secondary | ICD-10-CM | POA: Diagnosis not present

## 2021-05-05 DIAGNOSIS — E782 Mixed hyperlipidemia: Secondary | ICD-10-CM | POA: Diagnosis not present

## 2021-05-05 DIAGNOSIS — I5032 Chronic diastolic (congestive) heart failure: Secondary | ICD-10-CM | POA: Diagnosis not present

## 2021-05-05 DIAGNOSIS — E1122 Type 2 diabetes mellitus with diabetic chronic kidney disease: Secondary | ICD-10-CM | POA: Diagnosis not present

## 2021-05-05 DIAGNOSIS — I10 Essential (primary) hypertension: Secondary | ICD-10-CM | POA: Diagnosis not present

## 2021-05-05 DIAGNOSIS — I700 Atherosclerosis of aorta: Secondary | ICD-10-CM | POA: Diagnosis not present

## 2021-05-05 DIAGNOSIS — Z940 Kidney transplant status: Secondary | ICD-10-CM | POA: Diagnosis not present

## 2021-05-05 DIAGNOSIS — D472 Monoclonal gammopathy: Secondary | ICD-10-CM | POA: Diagnosis not present

## 2021-05-05 DIAGNOSIS — E039 Hypothyroidism, unspecified: Secondary | ICD-10-CM | POA: Diagnosis not present

## 2021-05-17 DIAGNOSIS — Z6841 Body Mass Index (BMI) 40.0 and over, adult: Secondary | ICD-10-CM | POA: Diagnosis not present

## 2021-05-17 DIAGNOSIS — D631 Anemia in chronic kidney disease: Secondary | ICD-10-CM | POA: Diagnosis not present

## 2021-05-17 DIAGNOSIS — N189 Chronic kidney disease, unspecified: Secondary | ICD-10-CM | POA: Diagnosis not present

## 2021-05-17 DIAGNOSIS — M109 Gout, unspecified: Secondary | ICD-10-CM | POA: Diagnosis not present

## 2021-05-17 DIAGNOSIS — Z940 Kidney transplant status: Secondary | ICD-10-CM | POA: Diagnosis not present

## 2021-05-17 DIAGNOSIS — N2581 Secondary hyperparathyroidism of renal origin: Secondary | ICD-10-CM | POA: Diagnosis not present

## 2021-05-17 DIAGNOSIS — E1122 Type 2 diabetes mellitus with diabetic chronic kidney disease: Secondary | ICD-10-CM | POA: Diagnosis not present

## 2021-05-17 DIAGNOSIS — E785 Hyperlipidemia, unspecified: Secondary | ICD-10-CM | POA: Diagnosis not present

## 2021-05-17 DIAGNOSIS — I129 Hypertensive chronic kidney disease with stage 1 through stage 4 chronic kidney disease, or unspecified chronic kidney disease: Secondary | ICD-10-CM | POA: Diagnosis not present

## 2021-05-18 DIAGNOSIS — D649 Anemia, unspecified: Secondary | ICD-10-CM | POA: Diagnosis not present

## 2021-05-18 DIAGNOSIS — Z6841 Body Mass Index (BMI) 40.0 and over, adult: Secondary | ICD-10-CM | POA: Diagnosis not present

## 2021-05-18 DIAGNOSIS — B258 Other cytomegaloviral diseases: Secondary | ICD-10-CM | POA: Diagnosis not present

## 2021-05-18 DIAGNOSIS — R635 Abnormal weight gain: Secondary | ICD-10-CM | POA: Insufficient documentation

## 2021-05-18 DIAGNOSIS — Z794 Long term (current) use of insulin: Secondary | ICD-10-CM | POA: Diagnosis not present

## 2021-05-18 DIAGNOSIS — D72819 Decreased white blood cell count, unspecified: Secondary | ICD-10-CM | POA: Diagnosis not present

## 2021-05-18 DIAGNOSIS — Z4822 Encounter for aftercare following kidney transplant: Secondary | ICD-10-CM | POA: Diagnosis not present

## 2021-05-18 DIAGNOSIS — Z792 Long term (current) use of antibiotics: Secondary | ICD-10-CM | POA: Diagnosis not present

## 2021-05-18 DIAGNOSIS — E119 Type 2 diabetes mellitus without complications: Secondary | ICD-10-CM | POA: Diagnosis not present

## 2021-05-18 DIAGNOSIS — D8989 Other specified disorders involving the immune mechanism, not elsewhere classified: Secondary | ICD-10-CM | POA: Diagnosis not present

## 2021-05-18 DIAGNOSIS — Z940 Kidney transplant status: Secondary | ICD-10-CM | POA: Diagnosis not present

## 2021-05-18 DIAGNOSIS — I10 Essential (primary) hypertension: Secondary | ICD-10-CM | POA: Diagnosis not present

## 2021-05-18 DIAGNOSIS — T8619 Other complication of kidney transplant: Secondary | ICD-10-CM | POA: Diagnosis not present

## 2021-05-18 DIAGNOSIS — D849 Immunodeficiency, unspecified: Secondary | ICD-10-CM | POA: Diagnosis not present

## 2021-05-18 DIAGNOSIS — E785 Hyperlipidemia, unspecified: Secondary | ICD-10-CM | POA: Diagnosis not present

## 2021-06-06 DIAGNOSIS — C641 Malignant neoplasm of right kidney, except renal pelvis: Secondary | ICD-10-CM | POA: Diagnosis not present

## 2021-06-23 DIAGNOSIS — E1165 Type 2 diabetes mellitus with hyperglycemia: Secondary | ICD-10-CM | POA: Diagnosis not present

## 2021-06-27 DIAGNOSIS — Z940 Kidney transplant status: Secondary | ICD-10-CM | POA: Diagnosis not present

## 2021-06-27 DIAGNOSIS — E1122 Type 2 diabetes mellitus with diabetic chronic kidney disease: Secondary | ICD-10-CM | POA: Diagnosis not present

## 2021-06-28 DIAGNOSIS — Z940 Kidney transplant status: Secondary | ICD-10-CM | POA: Diagnosis not present

## 2021-07-09 DIAGNOSIS — N1330 Unspecified hydronephrosis: Secondary | ICD-10-CM | POA: Diagnosis not present

## 2021-07-09 DIAGNOSIS — C641 Malignant neoplasm of right kidney, except renal pelvis: Secondary | ICD-10-CM | POA: Diagnosis not present

## 2021-07-22 DIAGNOSIS — Z940 Kidney transplant status: Secondary | ICD-10-CM | POA: Diagnosis not present

## 2021-07-22 DIAGNOSIS — Z4822 Encounter for aftercare following kidney transplant: Secondary | ICD-10-CM | POA: Diagnosis not present

## 2021-07-25 DIAGNOSIS — C641 Malignant neoplasm of right kidney, except renal pelvis: Secondary | ICD-10-CM | POA: Diagnosis not present

## 2021-08-01 DIAGNOSIS — Z940 Kidney transplant status: Secondary | ICD-10-CM | POA: Diagnosis not present

## 2021-08-09 DIAGNOSIS — Z6841 Body Mass Index (BMI) 40.0 and over, adult: Secondary | ICD-10-CM | POA: Diagnosis not present

## 2021-08-09 DIAGNOSIS — E785 Hyperlipidemia, unspecified: Secondary | ICD-10-CM | POA: Diagnosis not present

## 2021-08-09 DIAGNOSIS — I129 Hypertensive chronic kidney disease with stage 1 through stage 4 chronic kidney disease, or unspecified chronic kidney disease: Secondary | ICD-10-CM | POA: Diagnosis not present

## 2021-08-09 DIAGNOSIS — N2581 Secondary hyperparathyroidism of renal origin: Secondary | ICD-10-CM | POA: Diagnosis not present

## 2021-08-09 DIAGNOSIS — N189 Chronic kidney disease, unspecified: Secondary | ICD-10-CM | POA: Diagnosis not present

## 2021-08-09 DIAGNOSIS — D631 Anemia in chronic kidney disease: Secondary | ICD-10-CM | POA: Diagnosis not present

## 2021-08-09 DIAGNOSIS — M109 Gout, unspecified: Secondary | ICD-10-CM | POA: Diagnosis not present

## 2021-08-09 DIAGNOSIS — Z940 Kidney transplant status: Secondary | ICD-10-CM | POA: Diagnosis not present

## 2021-08-09 DIAGNOSIS — E1122 Type 2 diabetes mellitus with diabetic chronic kidney disease: Secondary | ICD-10-CM | POA: Diagnosis not present

## 2021-08-11 DIAGNOSIS — E039 Hypothyroidism, unspecified: Secondary | ICD-10-CM | POA: Diagnosis not present

## 2021-08-11 DIAGNOSIS — I5032 Chronic diastolic (congestive) heart failure: Secondary | ICD-10-CM | POA: Diagnosis not present

## 2021-08-11 DIAGNOSIS — N186 End stage renal disease: Secondary | ICD-10-CM | POA: Diagnosis not present

## 2021-08-11 DIAGNOSIS — E1122 Type 2 diabetes mellitus with diabetic chronic kidney disease: Secondary | ICD-10-CM | POA: Diagnosis not present

## 2021-08-11 DIAGNOSIS — I10 Essential (primary) hypertension: Secondary | ICD-10-CM | POA: Diagnosis not present

## 2021-08-11 DIAGNOSIS — E782 Mixed hyperlipidemia: Secondary | ICD-10-CM | POA: Diagnosis not present

## 2021-09-05 DIAGNOSIS — Z940 Kidney transplant status: Secondary | ICD-10-CM | POA: Diagnosis not present

## 2021-09-05 IMAGING — US IR US GUIDE VASC ACCESS RIGHT
1 series · 1 of 1 positions shown · non-contrast
Comparison: none

INDICATION: 63-year-old female with renal failure referred for hemodialysis
catheter placement

[Series 1: ir us guide vasc access right · 1 of 1 slices shown]
[im 1/1]
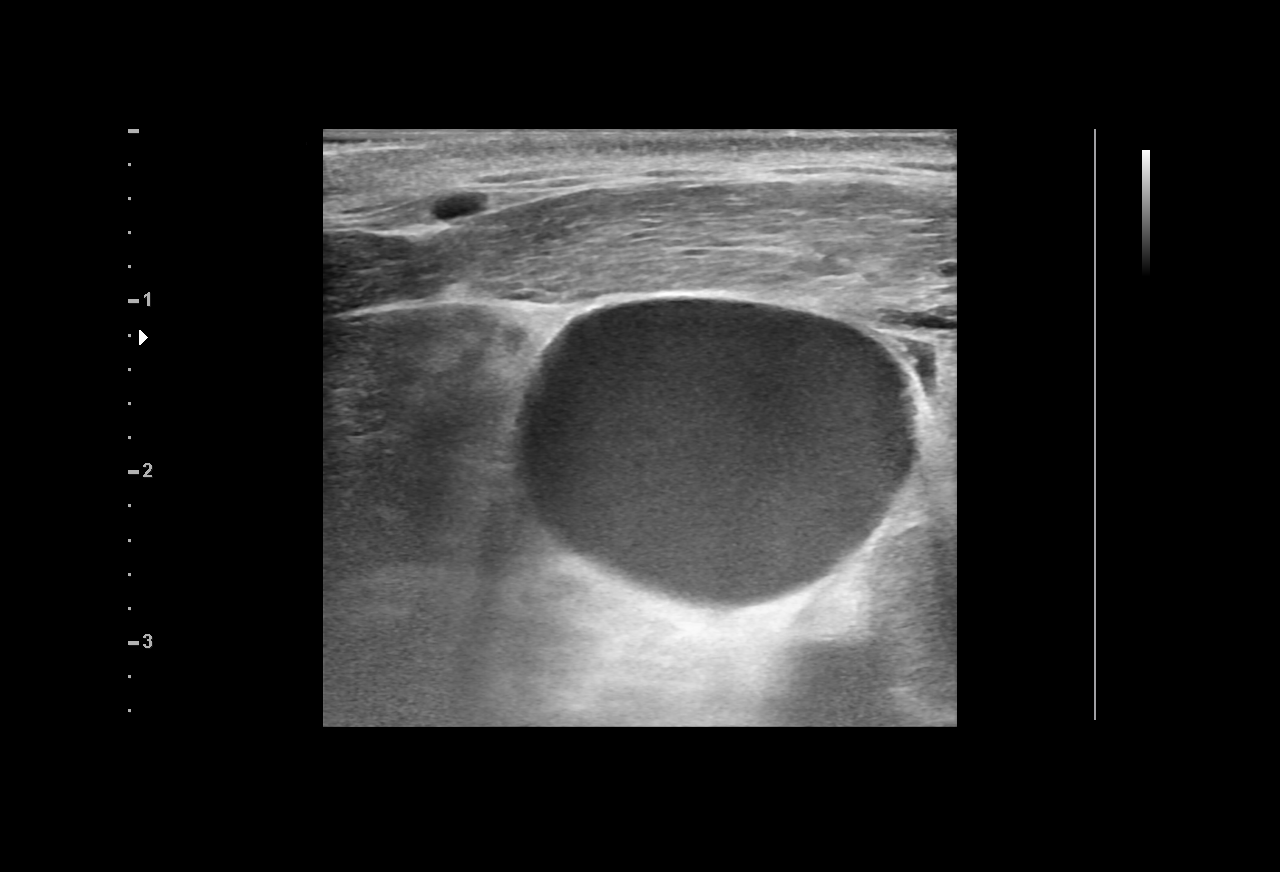

[1 of 1 positions shown; findings below may reference images not displayed]

EXAM:
IMAGE GUIDED TUNNELED HEMODIALYSIS CATHETER PLACEMENT

MEDICATIONS:
2 g Ancef; The antibiotic was administered within an appropriate
time interval prior to skin puncture.

ANESTHESIA/SEDATION:
Moderate (conscious) sedation was employed during this procedure. A
total of Versed 0 mg and Fentanyl 25 mcg was administered
intravenously.

Moderate Sedation Time: 0 minutes. The patient's level of
consciousness and vital signs were monitored continuously by
radiology nursing throughout the procedure under my direct
supervision.

FLUOROSCOPY TIME:  Fluoroscopy Time: 0 minutes 36 seconds (5 mGy).

COMPLICATIONS:
None



Ultrasound survey was performed.

Micropuncture kit was utilized to access the right internal jugular
vein under direct, real-time ultrasound guidance after the overlying
soft tissues were anesthetized with 1% lidocaine with epinephrine.
Stab incision was made with 11 blade scalpel. Microwire was passed
centrally. The microwire was then marked to measure appropriate
internal catheter length. External tunneled length was estimated. A
total tip to cuff length of 19 cm was selected.

035 guidewire was advanced to the level of the IVC.

Skin and subcutaneous tissues of chest wall below the clavicle were
generously infiltrated with 1% lidocaine for local anesthesia. A
small stab incision was made with 11 blade scalpel. The selected
hemodialysis catheter was tunneled in a retrograde fashion from the
anterior chest wall to the venotomy incision.

Serial dilation was performed and then a peel-away sheath was
placed.

The catheter was then placed through the peel-away sheath with tips
ultimately positioned within the superior aspect of the right
atrium. Final catheter positioning was confirmed and documented with
a spot radiographic image. The catheter aspirates and flushes
normally. The catheter was flushed with appropriate volume heparin
dwells.

The catheter exit site was secured with a 0-Prolene retention
suture. Gel-Foam slurry was infused into the soft tissue tract.

The venotomy incision was closed Derma bond and sterile dressing.
Dressings were applied at the chest wall.

Patient tolerated the procedure well and remained hemodynamically
stable throughout.

No complications were encountered and no significant blood loss
encountered.
IMPRESSION: Status post placement of right IJ tunneled hemodialysis catheter.

## 2021-09-05 IMAGING — XA IR FLUORO GUIDE CV LINE*R*
1 series · 1 of 1 positions shown · non-contrast
Comparison: none

INDICATION: 63-year-old female with renal failure referred for hemodialysis
catheter placement

[Series 2: fl (-) angio · 1 of 1 slices shown]
[im 1/1]
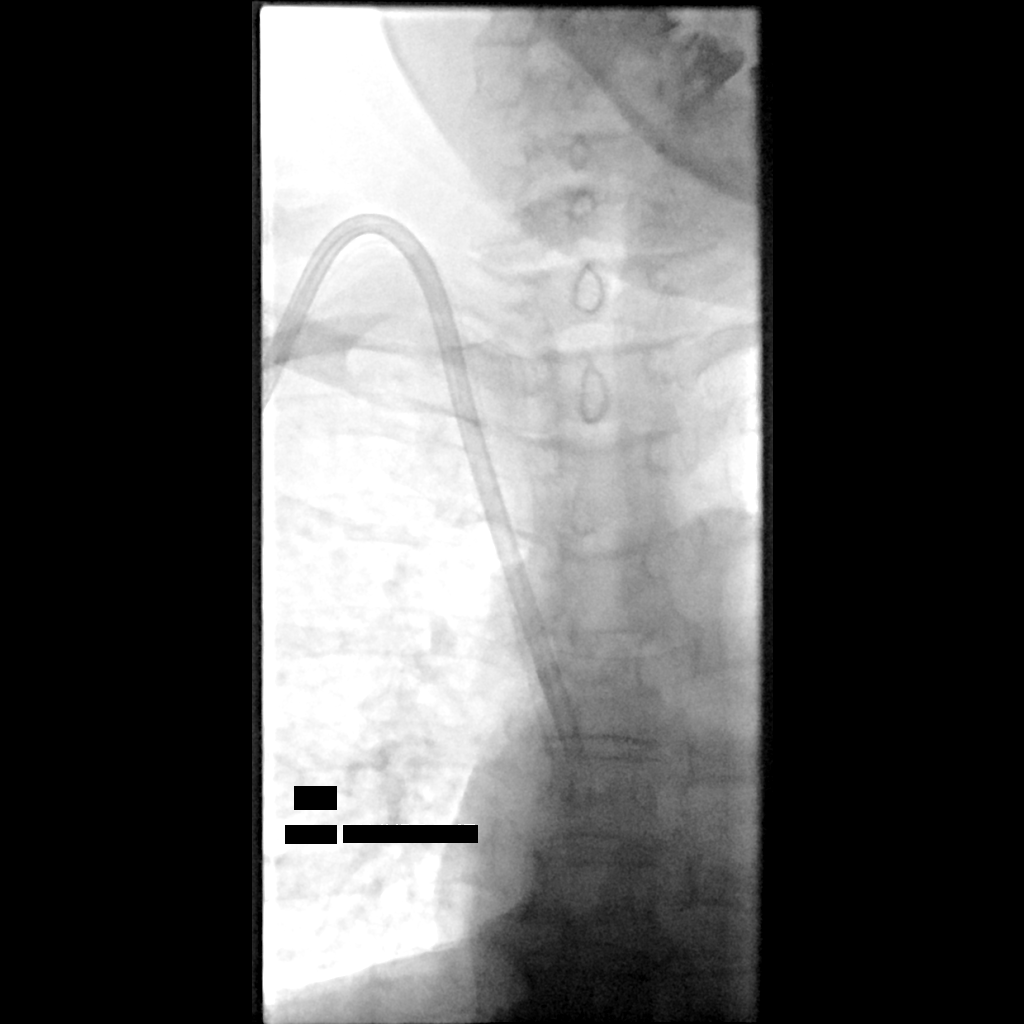

[1 of 1 positions shown; findings below may reference images not displayed]

EXAM:
IMAGE GUIDED TUNNELED HEMODIALYSIS CATHETER PLACEMENT

MEDICATIONS:
2 g Ancef; The antibiotic was administered within an appropriate
time interval prior to skin puncture.

ANESTHESIA/SEDATION:
Moderate (conscious) sedation was employed during this procedure. A
total of Versed 0 mg and Fentanyl 25 mcg was administered
intravenously.

Moderate Sedation Time: 0 minutes. The patient's level of
consciousness and vital signs were monitored continuously by
radiology nursing throughout the procedure under my direct
supervision.

FLUOROSCOPY TIME:  Fluoroscopy Time: 0 minutes 36 seconds (5 mGy).

COMPLICATIONS:
None



Ultrasound survey was performed.

Micropuncture kit was utilized to access the right internal jugular
vein under direct, real-time ultrasound guidance after the overlying
soft tissues were anesthetized with 1% lidocaine with epinephrine.
Stab incision was made with 11 blade scalpel. Microwire was passed
centrally. The microwire was then marked to measure appropriate
internal catheter length. External tunneled length was estimated. A
total tip to cuff length of 19 cm was selected.

035 guidewire was advanced to the level of the IVC.

Skin and subcutaneous tissues of chest wall below the clavicle were
generously infiltrated with 1% lidocaine for local anesthesia. A
small stab incision was made with 11 blade scalpel. The selected
hemodialysis catheter was tunneled in a retrograde fashion from the
anterior chest wall to the venotomy incision.

Serial dilation was performed and then a peel-away sheath was
placed.

The catheter was then placed through the peel-away sheath with tips
ultimately positioned within the superior aspect of the right
atrium. Final catheter positioning was confirmed and documented with
a spot radiographic image. The catheter aspirates and flushes
normally. The catheter was flushed with appropriate volume heparin
dwells.

The catheter exit site was secured with a 0-Prolene retention
suture. Gel-Foam slurry was infused into the soft tissue tract.

The venotomy incision was closed Derma bond and sterile dressing.
Dressings were applied at the chest wall.

Patient tolerated the procedure well and remained hemodynamically
stable throughout.

No complications were encountered and no significant blood loss
encountered.
IMPRESSION: Status post placement of right IJ tunneled hemodialysis catheter.

## 2021-09-10 IMAGING — DX DG CHEST 1V PORT
1 series · 1 of 1 positions shown · non-contrast
Comparison: March 26, 2007

CLINICAL DATA: Cough

EXAM:
PORTABLE CHEST 1 VIEW

[chest ap]
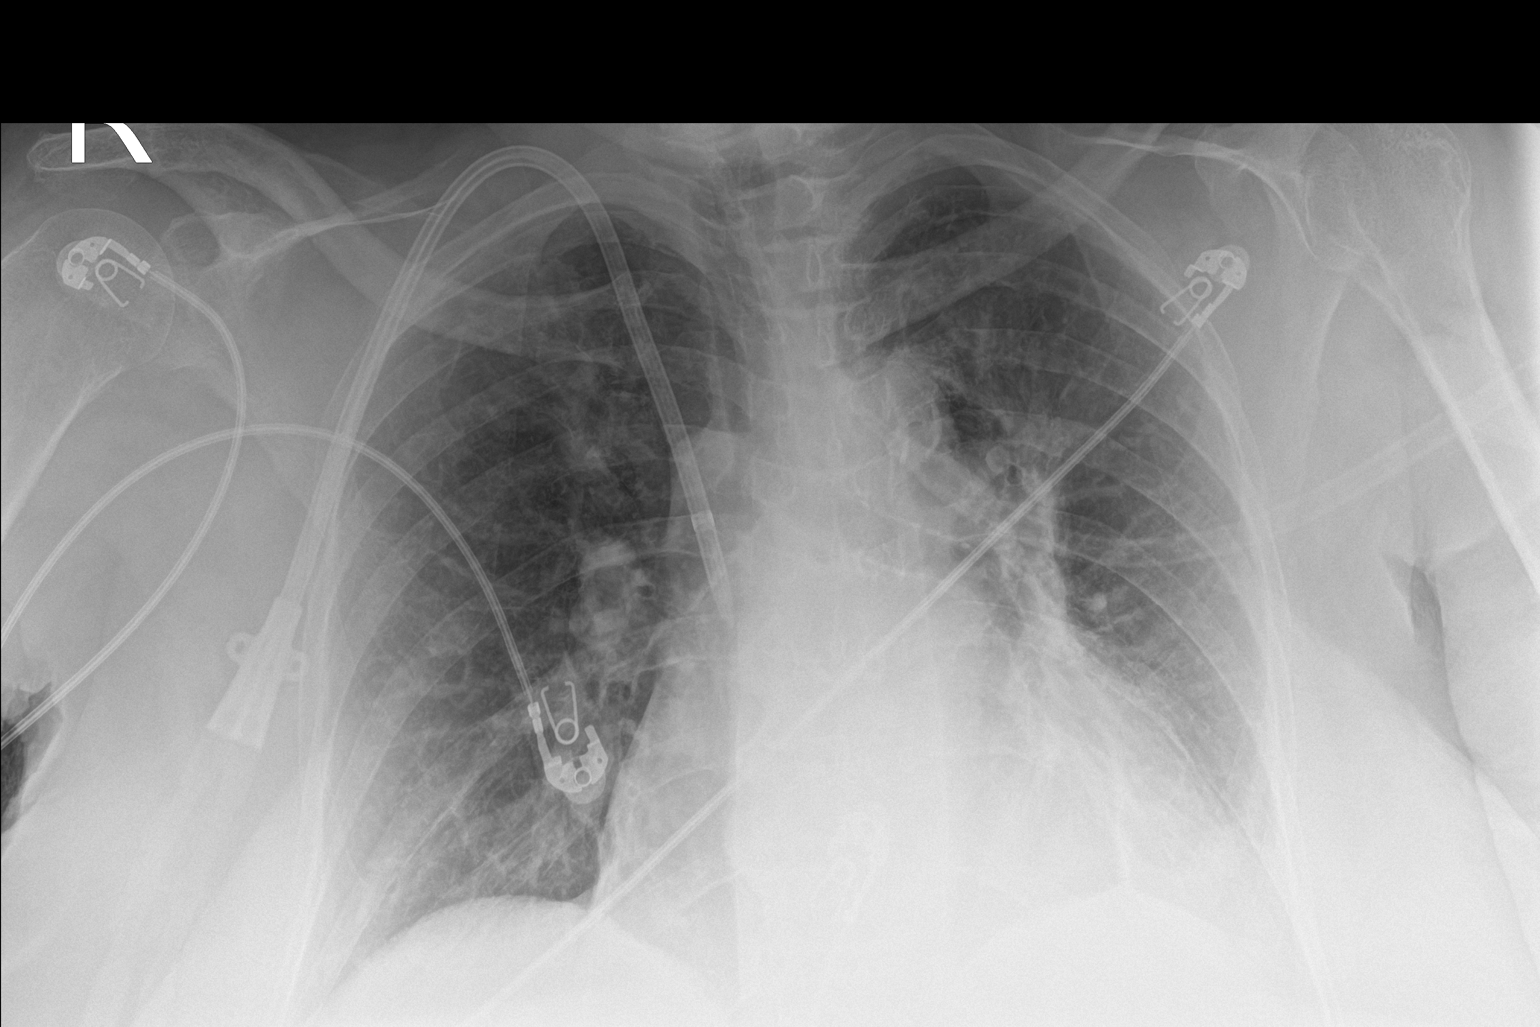

[1 of 1 positions shown; findings below may reference images not displayed]

FINDINGS: There is mild cardiomegaly. A right-sided central venous catheter
seen with the tip at the superior cavoatrial junction. There is mild
prominence of the central pulmonary vasculature. There is patchy
airspace opacity seen within the retrocardiac region. The visualized
skeletal structures are unremarkable.
IMPRESSION: cardiomegaly and mild pulmonary vascular congestion.

Patchy airspace opacity in the retrocardiac region which could be
due to atelectasis and/or early infectious etiology

## 2021-10-03 DIAGNOSIS — E1122 Type 2 diabetes mellitus with diabetic chronic kidney disease: Secondary | ICD-10-CM | POA: Diagnosis not present

## 2021-10-03 DIAGNOSIS — Z940 Kidney transplant status: Secondary | ICD-10-CM | POA: Diagnosis not present

## 2021-10-06 DIAGNOSIS — I5032 Chronic diastolic (congestive) heart failure: Secondary | ICD-10-CM | POA: Diagnosis not present

## 2021-10-06 DIAGNOSIS — E039 Hypothyroidism, unspecified: Secondary | ICD-10-CM | POA: Diagnosis not present

## 2021-10-06 DIAGNOSIS — I10 Essential (primary) hypertension: Secondary | ICD-10-CM | POA: Diagnosis not present

## 2021-10-06 DIAGNOSIS — E782 Mixed hyperlipidemia: Secondary | ICD-10-CM | POA: Diagnosis not present

## 2021-10-06 DIAGNOSIS — N186 End stage renal disease: Secondary | ICD-10-CM | POA: Diagnosis not present

## 2021-10-06 DIAGNOSIS — E1122 Type 2 diabetes mellitus with diabetic chronic kidney disease: Secondary | ICD-10-CM | POA: Diagnosis not present

## 2021-10-13 ENCOUNTER — Ambulatory Visit: Payer: Self-pay

## 2021-10-13 NOTE — Patient Outreach (Signed)
Care Coordination   Initial Visit Note   10/13/2021 Name: Kristen English MRN: [id number] DOB: 24-Jan-1956  Kristen English is a 66 y.o. year old female who sees Kristen Housekeeper, MD for primary care. I spoke with  Kristen English by phone today  What matters to the patients health and wellness today?  Dance movement psychotherapist for Kristen English  SDOH assessments and interventions completed:   Yes SDOH Interventions Today    Flowsheet Row Most Recent Value  SDOH Interventions   Food Insecurity Interventions Intervention Not Indicated  Transportation Interventions Intervention Not Indicated       Care Coordination Interventions Activated:  Yes Care Coordination Interventions:  Yes, provided  Follow up plan: Follow up call scheduled for November 17, 2021 at 4 pm.  Encounter Outcome:  Pt. Visit Completed  Kristen English Care Management [phone]

## 2021-10-13 NOTE — Patient Instructions (Signed)
Visit Information Thank you for allowing the Care Management team to participate in your care. It was great speaking with you today! Please do not hesitate to contact me if you require assistance prior to our next outreach.  Following are the goals we discussed today:       Our next appointment is by telephone on November 17, 2021 at 4 pm. Please call the care guide team at [phone] if you need to cancel or reschedule your appointment.   Ms. Wieseler verbalized understanding of information discussed during the telephonic outreach today. Decline offer for mailed instructions, educational materials, and care plan.  A member of the care team will follow up next month.  Horris Latino [facility] Care Management [phone]

## 2021-10-21 DIAGNOSIS — Z940 Kidney transplant status: Secondary | ICD-10-CM | POA: Diagnosis not present

## 2021-10-31 DIAGNOSIS — Z940 Kidney transplant status: Secondary | ICD-10-CM | POA: Diagnosis not present

## 2021-11-10 DIAGNOSIS — I129 Hypertensive chronic kidney disease with stage 1 through stage 4 chronic kidney disease, or unspecified chronic kidney disease: Secondary | ICD-10-CM | POA: Diagnosis not present

## 2021-11-10 DIAGNOSIS — E1122 Type 2 diabetes mellitus with diabetic chronic kidney disease: Secondary | ICD-10-CM | POA: Diagnosis not present

## 2021-11-10 DIAGNOSIS — D631 Anemia in chronic kidney disease: Secondary | ICD-10-CM | POA: Diagnosis not present

## 2021-11-10 DIAGNOSIS — Z6841 Body Mass Index (BMI) 40.0 and over, adult: Secondary | ICD-10-CM | POA: Diagnosis not present

## 2021-11-10 DIAGNOSIS — Z940 Kidney transplant status: Secondary | ICD-10-CM | POA: Diagnosis not present

## 2021-11-10 DIAGNOSIS — E785 Hyperlipidemia, unspecified: Secondary | ICD-10-CM | POA: Diagnosis not present

## 2021-11-10 DIAGNOSIS — M109 Gout, unspecified: Secondary | ICD-10-CM | POA: Diagnosis not present

## 2021-11-10 DIAGNOSIS — N189 Chronic kidney disease, unspecified: Secondary | ICD-10-CM | POA: Diagnosis not present

## 2021-11-10 DIAGNOSIS — N2581 Secondary hyperparathyroidism of renal origin: Secondary | ICD-10-CM | POA: Diagnosis not present

## 2021-11-14 DIAGNOSIS — I10 Essential (primary) hypertension: Secondary | ICD-10-CM | POA: Diagnosis not present

## 2021-11-14 DIAGNOSIS — Z85528 Personal history of other malignant neoplasm of kidney: Secondary | ICD-10-CM | POA: Diagnosis not present

## 2021-11-14 DIAGNOSIS — Z0000 Encounter for general adult medical examination without abnormal findings: Secondary | ICD-10-CM | POA: Diagnosis not present

## 2021-11-14 DIAGNOSIS — Z1331 Encounter for screening for depression: Secondary | ICD-10-CM | POA: Diagnosis not present

## 2021-11-14 DIAGNOSIS — Z940 Kidney transplant status: Secondary | ICD-10-CM | POA: Diagnosis not present

## 2021-11-14 DIAGNOSIS — M109 Gout, unspecified: Secondary | ICD-10-CM | POA: Diagnosis not present

## 2021-11-14 DIAGNOSIS — E782 Mixed hyperlipidemia: Secondary | ICD-10-CM | POA: Diagnosis not present

## 2021-11-14 DIAGNOSIS — E039 Hypothyroidism, unspecified: Secondary | ICD-10-CM | POA: Diagnosis not present

## 2021-11-14 DIAGNOSIS — I5032 Chronic diastolic (congestive) heart failure: Secondary | ICD-10-CM | POA: Diagnosis not present

## 2021-11-14 DIAGNOSIS — E1122 Type 2 diabetes mellitus with diabetic chronic kidney disease: Secondary | ICD-10-CM | POA: Diagnosis not present

## 2021-12-05 DIAGNOSIS — Z940 Kidney transplant status: Secondary | ICD-10-CM | POA: Diagnosis not present

## 2022-01-02 DIAGNOSIS — E119 Type 2 diabetes mellitus without complications: Secondary | ICD-10-CM | POA: Diagnosis not present

## 2022-01-09 DIAGNOSIS — Z940 Kidney transplant status: Secondary | ICD-10-CM | POA: Diagnosis not present

## 2022-02-08 DIAGNOSIS — Z940 Kidney transplant status: Secondary | ICD-10-CM | POA: Diagnosis not present

## 2022-02-14 DIAGNOSIS — N189 Chronic kidney disease, unspecified: Secondary | ICD-10-CM | POA: Diagnosis not present

## 2022-02-14 DIAGNOSIS — E1122 Type 2 diabetes mellitus with diabetic chronic kidney disease: Secondary | ICD-10-CM | POA: Diagnosis not present

## 2022-02-14 DIAGNOSIS — M109 Gout, unspecified: Secondary | ICD-10-CM | POA: Diagnosis not present

## 2022-02-14 DIAGNOSIS — Z940 Kidney transplant status: Secondary | ICD-10-CM | POA: Diagnosis not present

## 2022-02-14 DIAGNOSIS — Z6841 Body Mass Index (BMI) 40.0 and over, adult: Secondary | ICD-10-CM | POA: Diagnosis not present

## 2022-02-14 DIAGNOSIS — E785 Hyperlipidemia, unspecified: Secondary | ICD-10-CM | POA: Diagnosis not present

## 2022-02-14 DIAGNOSIS — N2581 Secondary hyperparathyroidism of renal origin: Secondary | ICD-10-CM | POA: Diagnosis not present

## 2022-02-14 DIAGNOSIS — I129 Hypertensive chronic kidney disease with stage 1 through stage 4 chronic kidney disease, or unspecified chronic kidney disease: Secondary | ICD-10-CM | POA: Diagnosis not present

## 2022-02-14 DIAGNOSIS — D631 Anemia in chronic kidney disease: Secondary | ICD-10-CM | POA: Diagnosis not present

## 2022-02-15 DIAGNOSIS — Z940 Kidney transplant status: Secondary | ICD-10-CM | POA: Diagnosis not present

## 2022-03-06 DIAGNOSIS — H5213 Myopia, bilateral: Secondary | ICD-10-CM | POA: Diagnosis not present

## 2022-03-15 DIAGNOSIS — Z1231 Encounter for screening mammogram for malignant neoplasm of breast: Secondary | ICD-10-CM | POA: Diagnosis not present

## 2022-03-15 DIAGNOSIS — H524 Presbyopia: Secondary | ICD-10-CM | POA: Diagnosis not present

## 2022-04-04 DIAGNOSIS — E119 Type 2 diabetes mellitus without complications: Secondary | ICD-10-CM | POA: Diagnosis not present

## 2022-04-18 DIAGNOSIS — E1122 Type 2 diabetes mellitus with diabetic chronic kidney disease: Secondary | ICD-10-CM | POA: Diagnosis not present

## 2022-04-18 DIAGNOSIS — I5032 Chronic diastolic (congestive) heart failure: Secondary | ICD-10-CM | POA: Diagnosis not present

## 2022-04-18 DIAGNOSIS — E039 Hypothyroidism, unspecified: Secondary | ICD-10-CM | POA: Diagnosis not present

## 2022-04-18 DIAGNOSIS — N185 Chronic kidney disease, stage 5: Secondary | ICD-10-CM | POA: Diagnosis not present

## 2022-04-18 DIAGNOSIS — I10 Essential (primary) hypertension: Secondary | ICD-10-CM | POA: Diagnosis not present

## 2022-04-18 DIAGNOSIS — E782 Mixed hyperlipidemia: Secondary | ICD-10-CM | POA: Diagnosis not present

## 2022-04-24 DIAGNOSIS — Z940 Kidney transplant status: Secondary | ICD-10-CM | POA: Diagnosis not present

## 2022-05-16 DIAGNOSIS — I700 Atherosclerosis of aorta: Secondary | ICD-10-CM | POA: Diagnosis not present

## 2022-05-16 DIAGNOSIS — E039 Hypothyroidism, unspecified: Secondary | ICD-10-CM | POA: Diagnosis not present

## 2022-05-16 DIAGNOSIS — I5032 Chronic diastolic (congestive) heart failure: Secondary | ICD-10-CM | POA: Diagnosis not present

## 2022-05-16 DIAGNOSIS — N2581 Secondary hyperparathyroidism of renal origin: Secondary | ICD-10-CM | POA: Diagnosis not present

## 2022-05-16 DIAGNOSIS — E1122 Type 2 diabetes mellitus with diabetic chronic kidney disease: Secondary | ICD-10-CM | POA: Diagnosis not present

## 2022-05-16 DIAGNOSIS — D84821 Immunodeficiency due to drugs: Secondary | ICD-10-CM | POA: Diagnosis not present

## 2022-05-16 DIAGNOSIS — E1140 Type 2 diabetes mellitus with diabetic neuropathy, unspecified: Secondary | ICD-10-CM | POA: Diagnosis not present

## 2022-05-16 DIAGNOSIS — Z940 Kidney transplant status: Secondary | ICD-10-CM | POA: Diagnosis not present

## 2022-05-17 DIAGNOSIS — Z940 Kidney transplant status: Secondary | ICD-10-CM | POA: Diagnosis not present

## 2022-05-17 DIAGNOSIS — D631 Anemia in chronic kidney disease: Secondary | ICD-10-CM | POA: Diagnosis not present

## 2022-05-17 DIAGNOSIS — M109 Gout, unspecified: Secondary | ICD-10-CM | POA: Diagnosis not present

## 2022-05-17 DIAGNOSIS — I129 Hypertensive chronic kidney disease with stage 1 through stage 4 chronic kidney disease, or unspecified chronic kidney disease: Secondary | ICD-10-CM | POA: Diagnosis not present

## 2022-05-17 DIAGNOSIS — E1122 Type 2 diabetes mellitus with diabetic chronic kidney disease: Secondary | ICD-10-CM | POA: Diagnosis not present

## 2022-05-17 DIAGNOSIS — N2581 Secondary hyperparathyroidism of renal origin: Secondary | ICD-10-CM | POA: Diagnosis not present

## 2022-05-17 DIAGNOSIS — E785 Hyperlipidemia, unspecified: Secondary | ICD-10-CM | POA: Diagnosis not present

## 2022-05-17 DIAGNOSIS — N189 Chronic kidney disease, unspecified: Secondary | ICD-10-CM | POA: Diagnosis not present

## 2022-05-17 DIAGNOSIS — Z6841 Body Mass Index (BMI) 40.0 and over, adult: Secondary | ICD-10-CM | POA: Diagnosis not present

## 2022-05-18 DIAGNOSIS — Z940 Kidney transplant status: Secondary | ICD-10-CM | POA: Diagnosis not present

## 2022-05-18 DIAGNOSIS — Z5181 Encounter for therapeutic drug level monitoring: Secondary | ICD-10-CM | POA: Diagnosis not present

## 2022-05-18 DIAGNOSIS — E119 Type 2 diabetes mellitus without complications: Secondary | ICD-10-CM | POA: Diagnosis not present

## 2022-05-18 DIAGNOSIS — Z794 Long term (current) use of insulin: Secondary | ICD-10-CM | POA: Diagnosis not present

## 2022-05-18 DIAGNOSIS — E039 Hypothyroidism, unspecified: Secondary | ICD-10-CM | POA: Diagnosis not present

## 2022-05-18 DIAGNOSIS — D849 Immunodeficiency, unspecified: Secondary | ICD-10-CM | POA: Diagnosis not present

## 2022-05-18 DIAGNOSIS — I151 Hypertension secondary to other renal disorders: Secondary | ICD-10-CM | POA: Diagnosis not present

## 2022-05-18 DIAGNOSIS — Z23 Encounter for immunization: Secondary | ICD-10-CM | POA: Diagnosis not present

## 2022-05-18 DIAGNOSIS — Z79899 Other long term (current) drug therapy: Secondary | ICD-10-CM | POA: Diagnosis not present

## 2022-05-18 DIAGNOSIS — D649 Anemia, unspecified: Secondary | ICD-10-CM | POA: Diagnosis not present

## 2022-05-18 DIAGNOSIS — E1165 Type 2 diabetes mellitus with hyperglycemia: Secondary | ICD-10-CM | POA: Diagnosis not present

## 2022-05-18 DIAGNOSIS — Z4822 Encounter for aftercare following kidney transplant: Secondary | ICD-10-CM | POA: Diagnosis not present

## 2022-05-18 DIAGNOSIS — R635 Abnormal weight gain: Secondary | ICD-10-CM | POA: Diagnosis not present

## 2022-05-18 DIAGNOSIS — I10 Essential (primary) hypertension: Secondary | ICD-10-CM | POA: Diagnosis not present

## 2022-05-18 DIAGNOSIS — E785 Hyperlipidemia, unspecified: Secondary | ICD-10-CM | POA: Diagnosis not present

## 2022-06-16 DIAGNOSIS — N186 End stage renal disease: Secondary | ICD-10-CM | POA: Diagnosis not present

## 2022-06-16 DIAGNOSIS — I10 Essential (primary) hypertension: Secondary | ICD-10-CM | POA: Diagnosis not present

## 2022-06-16 DIAGNOSIS — E782 Mixed hyperlipidemia: Secondary | ICD-10-CM | POA: Diagnosis not present

## 2022-06-16 DIAGNOSIS — I5032 Chronic diastolic (congestive) heart failure: Secondary | ICD-10-CM | POA: Diagnosis not present

## 2022-06-16 DIAGNOSIS — E1122 Type 2 diabetes mellitus with diabetic chronic kidney disease: Secondary | ICD-10-CM | POA: Diagnosis not present

## 2022-06-16 DIAGNOSIS — E039 Hypothyroidism, unspecified: Secondary | ICD-10-CM | POA: Diagnosis not present

## 2022-07-11 DIAGNOSIS — N390 Urinary tract infection, site not specified: Secondary | ICD-10-CM | POA: Diagnosis not present

## 2022-07-11 DIAGNOSIS — E119 Type 2 diabetes mellitus without complications: Secondary | ICD-10-CM | POA: Diagnosis not present

## 2022-07-11 DIAGNOSIS — Z940 Kidney transplant status: Secondary | ICD-10-CM | POA: Diagnosis not present

## 2022-08-07 DIAGNOSIS — C641 Malignant neoplasm of right kidney, except renal pelvis: Secondary | ICD-10-CM | POA: Diagnosis not present

## 2022-09-06 DIAGNOSIS — Z940 Kidney transplant status: Secondary | ICD-10-CM | POA: Diagnosis not present

## 2022-09-11 DIAGNOSIS — Z6841 Body Mass Index (BMI) 40.0 and over, adult: Secondary | ICD-10-CM | POA: Diagnosis not present

## 2022-09-11 DIAGNOSIS — I129 Hypertensive chronic kidney disease with stage 1 through stage 4 chronic kidney disease, or unspecified chronic kidney disease: Secondary | ICD-10-CM | POA: Diagnosis not present

## 2022-09-11 DIAGNOSIS — N2581 Secondary hyperparathyroidism of renal origin: Secondary | ICD-10-CM | POA: Diagnosis not present

## 2022-09-11 DIAGNOSIS — Z940 Kidney transplant status: Secondary | ICD-10-CM | POA: Diagnosis not present

## 2022-09-11 DIAGNOSIS — D631 Anemia in chronic kidney disease: Secondary | ICD-10-CM | POA: Diagnosis not present

## 2022-09-11 DIAGNOSIS — E1122 Type 2 diabetes mellitus with diabetic chronic kidney disease: Secondary | ICD-10-CM | POA: Diagnosis not present

## 2022-09-11 DIAGNOSIS — E785 Hyperlipidemia, unspecified: Secondary | ICD-10-CM | POA: Diagnosis not present

## 2022-09-11 DIAGNOSIS — M109 Gout, unspecified: Secondary | ICD-10-CM | POA: Diagnosis not present

## 2022-09-11 DIAGNOSIS — N189 Chronic kidney disease, unspecified: Secondary | ICD-10-CM | POA: Diagnosis not present

## 2022-10-02 DIAGNOSIS — Z940 Kidney transplant status: Secondary | ICD-10-CM | POA: Diagnosis not present

## 2022-11-15 DIAGNOSIS — E1122 Type 2 diabetes mellitus with diabetic chronic kidney disease: Secondary | ICD-10-CM | POA: Diagnosis not present

## 2022-11-15 DIAGNOSIS — Z940 Kidney transplant status: Secondary | ICD-10-CM | POA: Diagnosis not present

## 2022-11-16 DIAGNOSIS — Z0000 Encounter for general adult medical examination without abnormal findings: Secondary | ICD-10-CM | POA: Diagnosis not present

## 2022-11-16 DIAGNOSIS — N1831 Chronic kidney disease, stage 3a: Secondary | ICD-10-CM | POA: Diagnosis not present

## 2022-11-16 DIAGNOSIS — Z1331 Encounter for screening for depression: Secondary | ICD-10-CM | POA: Diagnosis not present

## 2022-11-16 DIAGNOSIS — I5032 Chronic diastolic (congestive) heart failure: Secondary | ICD-10-CM | POA: Diagnosis not present

## 2022-11-16 DIAGNOSIS — M109 Gout, unspecified: Secondary | ICD-10-CM | POA: Diagnosis not present

## 2022-11-16 DIAGNOSIS — E1122 Type 2 diabetes mellitus with diabetic chronic kidney disease: Secondary | ICD-10-CM | POA: Diagnosis not present

## 2022-11-16 DIAGNOSIS — E039 Hypothyroidism, unspecified: Secondary | ICD-10-CM | POA: Diagnosis not present

## 2022-11-16 DIAGNOSIS — D472 Monoclonal gammopathy: Secondary | ICD-10-CM | POA: Diagnosis not present

## 2022-11-16 DIAGNOSIS — I10 Essential (primary) hypertension: Secondary | ICD-10-CM | POA: Diagnosis not present

## 2022-11-16 DIAGNOSIS — E782 Mixed hyperlipidemia: Secondary | ICD-10-CM | POA: Diagnosis not present

## 2022-11-16 DIAGNOSIS — I700 Atherosclerosis of aorta: Secondary | ICD-10-CM | POA: Diagnosis not present

## 2022-11-27 DIAGNOSIS — E119 Type 2 diabetes mellitus without complications: Secondary | ICD-10-CM | POA: Diagnosis not present

## 2022-12-12 DIAGNOSIS — I129 Hypertensive chronic kidney disease with stage 1 through stage 4 chronic kidney disease, or unspecified chronic kidney disease: Secondary | ICD-10-CM | POA: Diagnosis not present

## 2022-12-12 DIAGNOSIS — E785 Hyperlipidemia, unspecified: Secondary | ICD-10-CM | POA: Diagnosis not present

## 2022-12-12 DIAGNOSIS — E1122 Type 2 diabetes mellitus with diabetic chronic kidney disease: Secondary | ICD-10-CM | POA: Diagnosis not present

## 2022-12-12 DIAGNOSIS — N189 Chronic kidney disease, unspecified: Secondary | ICD-10-CM | POA: Diagnosis not present

## 2022-12-12 DIAGNOSIS — N2581 Secondary hyperparathyroidism of renal origin: Secondary | ICD-10-CM | POA: Diagnosis not present

## 2022-12-12 DIAGNOSIS — Z940 Kidney transplant status: Secondary | ICD-10-CM | POA: Diagnosis not present

## 2022-12-12 DIAGNOSIS — M109 Gout, unspecified: Secondary | ICD-10-CM | POA: Diagnosis not present

## 2022-12-12 DIAGNOSIS — Z6841 Body Mass Index (BMI) 40.0 and over, adult: Secondary | ICD-10-CM | POA: Diagnosis not present

## 2022-12-12 DIAGNOSIS — D631 Anemia in chronic kidney disease: Secondary | ICD-10-CM | POA: Diagnosis not present

## 2023-01-15 DIAGNOSIS — N189 Chronic kidney disease, unspecified: Secondary | ICD-10-CM | POA: Diagnosis not present

## 2023-01-15 DIAGNOSIS — Z940 Kidney transplant status: Secondary | ICD-10-CM | POA: Diagnosis not present

## 2023-01-23 DIAGNOSIS — Z940 Kidney transplant status: Secondary | ICD-10-CM | POA: Diagnosis not present

## 2023-02-19 DIAGNOSIS — E1165 Type 2 diabetes mellitus with hyperglycemia: Secondary | ICD-10-CM | POA: Diagnosis not present

## 2023-03-05 DIAGNOSIS — N189 Chronic kidney disease, unspecified: Secondary | ICD-10-CM | POA: Diagnosis not present

## 2023-03-05 DIAGNOSIS — M109 Gout, unspecified: Secondary | ICD-10-CM | POA: Diagnosis not present

## 2023-03-05 DIAGNOSIS — E1122 Type 2 diabetes mellitus with diabetic chronic kidney disease: Secondary | ICD-10-CM | POA: Diagnosis not present

## 2023-03-05 DIAGNOSIS — Z940 Kidney transplant status: Secondary | ICD-10-CM | POA: Diagnosis not present

## 2023-03-06 DIAGNOSIS — Z940 Kidney transplant status: Secondary | ICD-10-CM | POA: Diagnosis not present

## 2023-03-12 DIAGNOSIS — E119 Type 2 diabetes mellitus without complications: Secondary | ICD-10-CM | POA: Diagnosis not present

## 2023-03-12 DIAGNOSIS — H5213 Myopia, bilateral: Secondary | ICD-10-CM | POA: Diagnosis not present

## 2023-03-12 DIAGNOSIS — H524 Presbyopia: Secondary | ICD-10-CM | POA: Diagnosis not present

## 2023-03-28 DIAGNOSIS — Z1231 Encounter for screening mammogram for malignant neoplasm of breast: Secondary | ICD-10-CM | POA: Diagnosis not present

## 2023-05-01 DIAGNOSIS — Z940 Kidney transplant status: Secondary | ICD-10-CM | POA: Diagnosis not present

## 2023-05-18 DIAGNOSIS — I5032 Chronic diastolic (congestive) heart failure: Secondary | ICD-10-CM | POA: Diagnosis not present

## 2023-05-18 DIAGNOSIS — Z940 Kidney transplant status: Secondary | ICD-10-CM | POA: Diagnosis not present

## 2023-05-18 DIAGNOSIS — E039 Hypothyroidism, unspecified: Secondary | ICD-10-CM | POA: Diagnosis not present

## 2023-05-18 DIAGNOSIS — E1122 Type 2 diabetes mellitus with diabetic chronic kidney disease: Secondary | ICD-10-CM | POA: Diagnosis not present

## 2023-05-18 DIAGNOSIS — D472 Monoclonal gammopathy: Secondary | ICD-10-CM | POA: Diagnosis not present

## 2023-05-18 DIAGNOSIS — I10 Essential (primary) hypertension: Secondary | ICD-10-CM | POA: Diagnosis not present

## 2023-05-18 DIAGNOSIS — N1831 Chronic kidney disease, stage 3a: Secondary | ICD-10-CM | POA: Diagnosis not present

## 2023-05-18 DIAGNOSIS — N2581 Secondary hyperparathyroidism of renal origin: Secondary | ICD-10-CM | POA: Diagnosis not present

## 2023-05-22 DIAGNOSIS — Z5181 Encounter for therapeutic drug level monitoring: Secondary | ICD-10-CM | POA: Diagnosis not present

## 2023-05-22 DIAGNOSIS — Z4822 Encounter for aftercare following kidney transplant: Secondary | ICD-10-CM | POA: Diagnosis not present

## 2023-05-22 DIAGNOSIS — D849 Immunodeficiency, unspecified: Secondary | ICD-10-CM | POA: Diagnosis not present

## 2023-05-22 DIAGNOSIS — E1165 Type 2 diabetes mellitus with hyperglycemia: Secondary | ICD-10-CM | POA: Diagnosis not present

## 2023-05-22 DIAGNOSIS — B259 Cytomegaloviral disease, unspecified: Secondary | ICD-10-CM | POA: Diagnosis not present

## 2023-05-22 DIAGNOSIS — Z978 Presence of other specified devices: Secondary | ICD-10-CM | POA: Diagnosis not present

## 2023-05-22 DIAGNOSIS — I129 Hypertensive chronic kidney disease with stage 1 through stage 4 chronic kidney disease, or unspecified chronic kidney disease: Secondary | ICD-10-CM | POA: Diagnosis not present

## 2023-05-22 DIAGNOSIS — I10 Essential (primary) hypertension: Secondary | ICD-10-CM | POA: Diagnosis not present

## 2023-05-22 DIAGNOSIS — Z79621 Long term (current) use of calcineurin inhibitor: Secondary | ICD-10-CM | POA: Diagnosis not present

## 2023-05-22 DIAGNOSIS — Z940 Kidney transplant status: Secondary | ICD-10-CM | POA: Diagnosis not present

## 2023-06-11 DIAGNOSIS — E119 Type 2 diabetes mellitus without complications: Secondary | ICD-10-CM | POA: Diagnosis not present

## 2023-06-25 DIAGNOSIS — N189 Chronic kidney disease, unspecified: Secondary | ICD-10-CM | POA: Diagnosis not present

## 2023-06-25 DIAGNOSIS — Z940 Kidney transplant status: Secondary | ICD-10-CM | POA: Diagnosis not present

## 2023-07-26 DIAGNOSIS — Z940 Kidney transplant status: Secondary | ICD-10-CM | POA: Diagnosis not present

## 2023-08-07 DIAGNOSIS — C641 Malignant neoplasm of right kidney, except renal pelvis: Secondary | ICD-10-CM | POA: Diagnosis not present

## 2023-08-15 DIAGNOSIS — E782 Mixed hyperlipidemia: Secondary | ICD-10-CM | POA: Diagnosis not present

## 2023-08-15 DIAGNOSIS — E1122 Type 2 diabetes mellitus with diabetic chronic kidney disease: Secondary | ICD-10-CM | POA: Diagnosis not present

## 2023-08-15 DIAGNOSIS — I5032 Chronic diastolic (congestive) heart failure: Secondary | ICD-10-CM | POA: Diagnosis not present

## 2023-08-15 DIAGNOSIS — N1831 Chronic kidney disease, stage 3a: Secondary | ICD-10-CM | POA: Diagnosis not present

## 2023-08-15 DIAGNOSIS — E039 Hypothyroidism, unspecified: Secondary | ICD-10-CM | POA: Diagnosis not present

## 2023-08-15 DIAGNOSIS — E1140 Type 2 diabetes mellitus with diabetic neuropathy, unspecified: Secondary | ICD-10-CM | POA: Diagnosis not present

## 2023-08-15 DIAGNOSIS — E1165 Type 2 diabetes mellitus with hyperglycemia: Secondary | ICD-10-CM | POA: Diagnosis not present

## 2023-08-15 DIAGNOSIS — I10 Essential (primary) hypertension: Secondary | ICD-10-CM | POA: Diagnosis not present

## 2023-08-24 DIAGNOSIS — I5032 Chronic diastolic (congestive) heart failure: Secondary | ICD-10-CM | POA: Diagnosis not present

## 2023-08-24 DIAGNOSIS — E1140 Type 2 diabetes mellitus with diabetic neuropathy, unspecified: Secondary | ICD-10-CM | POA: Diagnosis not present

## 2023-08-24 DIAGNOSIS — E1165 Type 2 diabetes mellitus with hyperglycemia: Secondary | ICD-10-CM | POA: Diagnosis not present

## 2023-08-24 DIAGNOSIS — E1122 Type 2 diabetes mellitus with diabetic chronic kidney disease: Secondary | ICD-10-CM | POA: Diagnosis not present

## 2023-08-25 DIAGNOSIS — E1140 Type 2 diabetes mellitus with diabetic neuropathy, unspecified: Secondary | ICD-10-CM | POA: Diagnosis not present

## 2023-08-25 DIAGNOSIS — E1165 Type 2 diabetes mellitus with hyperglycemia: Secondary | ICD-10-CM | POA: Diagnosis not present

## 2023-08-25 DIAGNOSIS — E1122 Type 2 diabetes mellitus with diabetic chronic kidney disease: Secondary | ICD-10-CM | POA: Diagnosis not present

## 2023-08-25 DIAGNOSIS — I5032 Chronic diastolic (congestive) heart failure: Secondary | ICD-10-CM | POA: Diagnosis not present

## 2023-08-27 DIAGNOSIS — Z940 Kidney transplant status: Secondary | ICD-10-CM | POA: Diagnosis not present

## 2023-08-27 DIAGNOSIS — N2581 Secondary hyperparathyroidism of renal origin: Secondary | ICD-10-CM | POA: Diagnosis not present

## 2023-08-27 DIAGNOSIS — N189 Chronic kidney disease, unspecified: Secondary | ICD-10-CM | POA: Diagnosis not present

## 2023-08-27 DIAGNOSIS — E1122 Type 2 diabetes mellitus with diabetic chronic kidney disease: Secondary | ICD-10-CM | POA: Diagnosis not present

## 2023-09-17 DIAGNOSIS — E1122 Type 2 diabetes mellitus with diabetic chronic kidney disease: Secondary | ICD-10-CM | POA: Diagnosis not present

## 2023-09-17 DIAGNOSIS — E039 Hypothyroidism, unspecified: Secondary | ICD-10-CM | POA: Diagnosis not present

## 2023-09-17 DIAGNOSIS — N1831 Chronic kidney disease, stage 3a: Secondary | ICD-10-CM | POA: Diagnosis not present

## 2023-09-17 DIAGNOSIS — E1165 Type 2 diabetes mellitus with hyperglycemia: Secondary | ICD-10-CM | POA: Diagnosis not present

## 2023-09-17 DIAGNOSIS — E1140 Type 2 diabetes mellitus with diabetic neuropathy, unspecified: Secondary | ICD-10-CM | POA: Diagnosis not present

## 2023-09-17 DIAGNOSIS — I5032 Chronic diastolic (congestive) heart failure: Secondary | ICD-10-CM | POA: Diagnosis not present

## 2023-09-22 DIAGNOSIS — I5032 Chronic diastolic (congestive) heart failure: Secondary | ICD-10-CM | POA: Diagnosis not present

## 2023-09-22 DIAGNOSIS — E1165 Type 2 diabetes mellitus with hyperglycemia: Secondary | ICD-10-CM | POA: Diagnosis not present

## 2023-09-22 DIAGNOSIS — E1122 Type 2 diabetes mellitus with diabetic chronic kidney disease: Secondary | ICD-10-CM | POA: Diagnosis not present

## 2023-09-22 DIAGNOSIS — E1140 Type 2 diabetes mellitus with diabetic neuropathy, unspecified: Secondary | ICD-10-CM | POA: Diagnosis not present

## 2023-10-18 DIAGNOSIS — E1165 Type 2 diabetes mellitus with hyperglycemia: Secondary | ICD-10-CM | POA: Diagnosis not present

## 2023-10-18 DIAGNOSIS — I5032 Chronic diastolic (congestive) heart failure: Secondary | ICD-10-CM | POA: Diagnosis not present

## 2023-10-18 DIAGNOSIS — N1831 Chronic kidney disease, stage 3a: Secondary | ICD-10-CM | POA: Diagnosis not present

## 2023-10-18 DIAGNOSIS — E1140 Type 2 diabetes mellitus with diabetic neuropathy, unspecified: Secondary | ICD-10-CM | POA: Diagnosis not present

## 2023-10-18 DIAGNOSIS — E039 Hypothyroidism, unspecified: Secondary | ICD-10-CM | POA: Diagnosis not present

## 2023-10-18 DIAGNOSIS — E1122 Type 2 diabetes mellitus with diabetic chronic kidney disease: Secondary | ICD-10-CM | POA: Diagnosis not present

## 2023-10-22 DIAGNOSIS — E1165 Type 2 diabetes mellitus with hyperglycemia: Secondary | ICD-10-CM | POA: Diagnosis not present

## 2023-10-22 DIAGNOSIS — E1122 Type 2 diabetes mellitus with diabetic chronic kidney disease: Secondary | ICD-10-CM | POA: Diagnosis not present

## 2023-10-22 DIAGNOSIS — E1140 Type 2 diabetes mellitus with diabetic neuropathy, unspecified: Secondary | ICD-10-CM | POA: Diagnosis not present

## 2023-10-22 DIAGNOSIS — I5032 Chronic diastolic (congestive) heart failure: Secondary | ICD-10-CM | POA: Diagnosis not present

## 2023-10-25 DIAGNOSIS — E119 Type 2 diabetes mellitus without complications: Secondary | ICD-10-CM | POA: Diagnosis not present

## 2023-10-30 DIAGNOSIS — Z940 Kidney transplant status: Secondary | ICD-10-CM | POA: Diagnosis not present

## 2023-11-05 DIAGNOSIS — N189 Chronic kidney disease, unspecified: Secondary | ICD-10-CM | POA: Diagnosis not present

## 2023-11-05 DIAGNOSIS — Z940 Kidney transplant status: Secondary | ICD-10-CM | POA: Diagnosis not present

## 2023-11-18 DIAGNOSIS — E039 Hypothyroidism, unspecified: Secondary | ICD-10-CM | POA: Diagnosis not present

## 2023-11-18 DIAGNOSIS — E1122 Type 2 diabetes mellitus with diabetic chronic kidney disease: Secondary | ICD-10-CM | POA: Diagnosis not present

## 2023-11-18 DIAGNOSIS — I5032 Chronic diastolic (congestive) heart failure: Secondary | ICD-10-CM | POA: Diagnosis not present

## 2023-11-18 DIAGNOSIS — E1165 Type 2 diabetes mellitus with hyperglycemia: Secondary | ICD-10-CM | POA: Diagnosis not present

## 2023-11-18 DIAGNOSIS — E1140 Type 2 diabetes mellitus with diabetic neuropathy, unspecified: Secondary | ICD-10-CM | POA: Diagnosis not present

## 2023-11-18 DIAGNOSIS — N1831 Chronic kidney disease, stage 3a: Secondary | ICD-10-CM | POA: Diagnosis not present

## 2023-11-20 DIAGNOSIS — D84821 Immunodeficiency due to drugs: Secondary | ICD-10-CM | POA: Diagnosis not present

## 2023-11-20 DIAGNOSIS — N1831 Chronic kidney disease, stage 3a: Secondary | ICD-10-CM | POA: Diagnosis not present

## 2023-11-20 DIAGNOSIS — Z1331 Encounter for screening for depression: Secondary | ICD-10-CM | POA: Diagnosis not present

## 2023-11-20 DIAGNOSIS — E1122 Type 2 diabetes mellitus with diabetic chronic kidney disease: Secondary | ICD-10-CM | POA: Diagnosis not present

## 2023-11-20 DIAGNOSIS — E782 Mixed hyperlipidemia: Secondary | ICD-10-CM | POA: Diagnosis not present

## 2023-11-20 DIAGNOSIS — D472 Monoclonal gammopathy: Secondary | ICD-10-CM | POA: Diagnosis not present

## 2023-11-20 DIAGNOSIS — Z0000 Encounter for general adult medical examination without abnormal findings: Secondary | ICD-10-CM | POA: Diagnosis not present

## 2023-11-20 DIAGNOSIS — I5032 Chronic diastolic (congestive) heart failure: Secondary | ICD-10-CM | POA: Diagnosis not present

## 2023-11-20 DIAGNOSIS — M109 Gout, unspecified: Secondary | ICD-10-CM | POA: Diagnosis not present

## 2023-11-20 DIAGNOSIS — I10 Essential (primary) hypertension: Secondary | ICD-10-CM | POA: Diagnosis not present

## 2023-11-20 DIAGNOSIS — Z85528 Personal history of other malignant neoplasm of kidney: Secondary | ICD-10-CM | POA: Diagnosis not present

## 2023-11-21 DIAGNOSIS — I5032 Chronic diastolic (congestive) heart failure: Secondary | ICD-10-CM | POA: Diagnosis not present

## 2023-11-21 DIAGNOSIS — E1140 Type 2 diabetes mellitus with diabetic neuropathy, unspecified: Secondary | ICD-10-CM | POA: Diagnosis not present

## 2023-11-21 DIAGNOSIS — E1165 Type 2 diabetes mellitus with hyperglycemia: Secondary | ICD-10-CM | POA: Diagnosis not present

## 2023-11-21 DIAGNOSIS — E1122 Type 2 diabetes mellitus with diabetic chronic kidney disease: Secondary | ICD-10-CM | POA: Diagnosis not present

## 2023-11-22 ENCOUNTER — Emergency Department (HOSPITAL_COMMUNITY)

## 2023-11-22 ENCOUNTER — Other Ambulatory Visit: Payer: Self-pay

## 2023-11-22 ENCOUNTER — Ambulatory Visit: Admission: EM | Admit: 2023-11-22 | Discharge: 2023-11-22 | Disposition: A | Discharge status: Home / Self Care

## 2023-11-22 ENCOUNTER — Emergency Department (HOSPITAL_COMMUNITY)
Admission: EM | Admit: 2023-11-22 | Discharge: 2023-11-22 | Disposition: A | Discharge status: Home / Self Care | Attending: Emergency Medicine | Admitting: Emergency Medicine

## 2023-11-22 ENCOUNTER — Encounter (HOSPITAL_COMMUNITY): Payer: Self-pay

## 2023-11-22 DIAGNOSIS — S0990XA Unspecified injury of head, initial encounter: Secondary | ICD-10-CM | POA: Diagnosis not present

## 2023-11-22 DIAGNOSIS — Z79899 Other long term (current) drug therapy: Secondary | ICD-10-CM | POA: Insufficient documentation

## 2023-11-22 DIAGNOSIS — M4802 Spinal stenosis, cervical region: Secondary | ICD-10-CM | POA: Diagnosis not present

## 2023-11-22 DIAGNOSIS — Z85528 Personal history of other malignant neoplasm of kidney: Secondary | ICD-10-CM | POA: Insufficient documentation

## 2023-11-22 DIAGNOSIS — N185 Chronic kidney disease, stage 5: Secondary | ICD-10-CM | POA: Insufficient documentation

## 2023-11-22 DIAGNOSIS — E2839 Other primary ovarian failure: Secondary | ICD-10-CM | POA: Insufficient documentation

## 2023-11-22 DIAGNOSIS — E1122 Type 2 diabetes mellitus with diabetic chronic kidney disease: Secondary | ICD-10-CM | POA: Diagnosis not present

## 2023-11-22 DIAGNOSIS — Z794 Long term (current) use of insulin: Secondary | ICD-10-CM | POA: Insufficient documentation

## 2023-11-22 DIAGNOSIS — I700 Atherosclerosis of aorta: Secondary | ICD-10-CM | POA: Insufficient documentation

## 2023-11-22 DIAGNOSIS — E782 Mixed hyperlipidemia: Secondary | ICD-10-CM | POA: Insufficient documentation

## 2023-11-22 DIAGNOSIS — N1831 Chronic kidney disease, stage 3a: Secondary | ICD-10-CM | POA: Insufficient documentation

## 2023-11-22 DIAGNOSIS — M25552 Pain in left hip: Secondary | ICD-10-CM | POA: Diagnosis not present

## 2023-11-22 DIAGNOSIS — M47816 Spondylosis without myelopathy or radiculopathy, lumbar region: Secondary | ICD-10-CM | POA: Diagnosis not present

## 2023-11-22 DIAGNOSIS — N189 Chronic kidney disease, unspecified: Secondary | ICD-10-CM | POA: Insufficient documentation

## 2023-11-22 DIAGNOSIS — S52611A Displaced fracture of right ulna styloid process, initial encounter for closed fracture: Secondary | ICD-10-CM | POA: Diagnosis not present

## 2023-11-22 DIAGNOSIS — I5032 Chronic diastolic (congestive) heart failure: Secondary | ICD-10-CM | POA: Insufficient documentation

## 2023-11-22 DIAGNOSIS — S3992XA Unspecified injury of lower back, initial encounter: Secondary | ICD-10-CM | POA: Diagnosis not present

## 2023-11-22 DIAGNOSIS — S6991XA Unspecified injury of right wrist, hand and finger(s), initial encounter: Secondary | ICD-10-CM | POA: Diagnosis present

## 2023-11-22 DIAGNOSIS — S3210XA Unspecified fracture of sacrum, initial encounter for closed fracture: Secondary | ICD-10-CM | POA: Diagnosis not present

## 2023-11-22 DIAGNOSIS — Z940 Kidney transplant status: Secondary | ICD-10-CM | POA: Insufficient documentation

## 2023-11-22 DIAGNOSIS — M79601 Pain in right arm: Secondary | ICD-10-CM | POA: Diagnosis not present

## 2023-11-22 DIAGNOSIS — E1142 Type 2 diabetes mellitus with diabetic polyneuropathy: Secondary | ICD-10-CM | POA: Insufficient documentation

## 2023-11-22 DIAGNOSIS — S199XXA Unspecified injury of neck, initial encounter: Secondary | ICD-10-CM | POA: Diagnosis not present

## 2023-11-22 DIAGNOSIS — K439 Ventral hernia without obstruction or gangrene: Secondary | ICD-10-CM | POA: Diagnosis not present

## 2023-11-22 DIAGNOSIS — E7800 Pure hypercholesterolemia, unspecified: Secondary | ICD-10-CM | POA: Insufficient documentation

## 2023-11-22 DIAGNOSIS — W108XXA Fall (on) (from) other stairs and steps, initial encounter: Secondary | ICD-10-CM | POA: Insufficient documentation

## 2023-11-22 DIAGNOSIS — W19XXXA Unspecified fall, initial encounter: Secondary | ICD-10-CM

## 2023-11-22 DIAGNOSIS — S52571A Other intraarticular fracture of lower end of right radius, initial encounter for closed fracture: Secondary | ICD-10-CM | POA: Diagnosis not present

## 2023-11-22 DIAGNOSIS — M25521 Pain in right elbow: Secondary | ICD-10-CM | POA: Diagnosis not present

## 2023-11-22 DIAGNOSIS — M1612 Unilateral primary osteoarthritis, left hip: Secondary | ICD-10-CM | POA: Diagnosis not present

## 2023-11-22 DIAGNOSIS — E11649 Type 2 diabetes mellitus with hypoglycemia without coma: Secondary | ICD-10-CM | POA: Insufficient documentation

## 2023-11-22 DIAGNOSIS — Z96653 Presence of artificial knee joint, bilateral: Secondary | ICD-10-CM | POA: Diagnosis not present

## 2023-11-22 DIAGNOSIS — M5136 Other intervertebral disc degeneration, lumbar region with discogenic back pain only: Secondary | ICD-10-CM | POA: Diagnosis not present

## 2023-11-22 DIAGNOSIS — M47812 Spondylosis without myelopathy or radiculopathy, cervical region: Secondary | ICD-10-CM | POA: Diagnosis not present

## 2023-11-22 DIAGNOSIS — I129 Hypertensive chronic kidney disease with stage 1 through stage 4 chronic kidney disease, or unspecified chronic kidney disease: Secondary | ICD-10-CM | POA: Diagnosis not present

## 2023-11-22 DIAGNOSIS — Z7984 Long term (current) use of oral hypoglycemic drugs: Secondary | ICD-10-CM | POA: Insufficient documentation

## 2023-11-22 DIAGNOSIS — D649 Anemia, unspecified: Secondary | ICD-10-CM | POA: Insufficient documentation

## 2023-11-22 DIAGNOSIS — S62101A Fracture of unspecified carpal bone, right wrist, initial encounter for closed fracture: Secondary | ICD-10-CM

## 2023-11-22 DIAGNOSIS — N184 Chronic kidney disease, stage 4 (severe): Secondary | ICD-10-CM | POA: Insufficient documentation

## 2023-11-22 MED ORDER — OXYCODONE-ACETAMINOPHEN 5-325 MG PO TABS: 1.0000 | ORAL_TABLET | Freq: Four times a day (QID) | ORAL | 0 refills | Status: AC | PRN

## 2023-11-22 NOTE — Discharge Instructions (Addendum)
 Follow-up with Dr. Colman for your wrist fracture. Follow-up with Dr. Sherida for your sacral fracture

## 2023-11-22 NOTE — ED Provider Notes (Addendum)
 Patient here today for evaluation of fall that occurred earlier today.  She reports that she lost her balance and fell backwards hitting the back of her head on bricks.  She denies any LOC or dizziness.  She is not on a blood thinner.  She also has pain to her sacrum and her right arm.  Recommended further evaluation in the emergency room given head injury.  Patient is agreeable to same.  Daughter who is with her will transport.  Shoulder sling provided for comfort during transport.   Billy Asberry FALCON, PA-C 11/22/23 1330    Billy Asberry FALCON, PA-C 11/22/23 1336

## 2023-11-22 NOTE — ED Triage Notes (Signed)
 Pt states she fell today. States she slipped and fell backwards. Pt hit the back of her head, no LOC. Pt landed on her right arm. Pt c/o head pain, right arm/wrist pain, left knee/hip pain and bilateral buttocks pain. Pt went to UC and no scans were done. Pt right arm was placed in a splint.

## 2023-11-22 NOTE — ED Provider Notes (Addendum)
 Patient signed out to me by Dr. Patsey pending results of x-rays.  Patient does have a right distal radius fracture.  Discussed with hand surgeon on-call who recommends sugar-tong splint and he will see the patient in the office.  Patient also has a nondisplaced sacral fracture and will be given analgesics for this as well.   Dasie Faden, MD 11/22/23 1726    Dasie Faden, MD 11/22/23 1726

## 2023-11-22 NOTE — ED Notes (Signed)
 Patient is being discharged from the Urgent Care and sent to the Emergency Department via POV with family . Per Billy, PA-C, patient is in need of higher level of care due to need for further imaging after hitting her head during fall. Patient is aware and verbalizes understanding of plan of care.  Vitals:   11/22/23 1319  BP: (!) 164/98  Pulse: 100  Resp: 14  Temp: 98.6 F (37 C)  SpO2: 95%

## 2023-11-22 NOTE — ED Triage Notes (Signed)
 Pt reports fall that occurred in the last hr. She was walking up her steps when she lost her balance and fell backward. Hit the back of her head on bricks at home. No LOC or dizziness. Not on anticoagulants. Pt's head, sacrum, and R arm is hurting after the fall. L leg was already hurting prior to the fall and is now worse. R wrist is swollen with deformity. Unable to lift R arm, hand, or wrist without severe pain. Pt states she tried to catch herself with her R hand when she felt herself fall back. Describes pain in all areas as throbbing pain. No meds taken since fall took place.

## 2023-11-22 NOTE — ED Provider Notes (Signed)
 Kristen English EMERGENCY DEPARTMENT AT [facility] Provider Note   CSN: [id number] Arrival date & time: 11/22/23  1400     Patient presents with: Kristen English is a 68 y.o. female.    Fall  Patient presents with fall.  Was going up some stairs lost her balance and fell backward striking her head.  Complaining of pain in her head right wrist low back buttocks and left hip.   Denies loss conscious.  Not on blood thinners.  Worried about the swelling on her right wrist.  Pain does radiate down from the back to the left thigh.      Past Medical History:  Diagnosis Date   Arthritis    Chronic kidney disease    protein in urine   Diabetes mellitus without complication (HCC)    Gout    Hypertension    Sleep disorder 06/29/2014   Past Surgical History:  Procedure Laterality Date   AV FISTULA PLACEMENT Right 10/07/2019   Procedure: ARTERIOVENOUS (AV) FISTULA CREATION RIGHT UPPER ARM;  Surgeon: Eliza Lonni RAMAN, MD;  Location: [facility];  Service: Vascular;  Laterality: Right;   COLONOSCOPY WITH PROPOFOL  N/A 05/11/2014   Procedure: COLONOSCOPY WITH PROPOFOL ;  Surgeon: Gladis MARLA Louder, MD;  Location: [facility] ENDOSCOPY;  Service: Endoscopy;  Laterality: N/A;   FOOT SURGERY Left 2007   IR FLUORO GUIDE CV LINE RIGHT  10/03/2019   IR US  GUIDE VASC ACCESS RIGHT  10/03/2019   JOINT REPLACEMENT  2009   bilateral knees   TOTAL KNEE ARTHROPLASTY     bilateral     Prior to Admission medications   Medication Sig Start Date End Date Taking? Authorizing Provider  amLODipine  (NORVASC ) 10 MG tablet Take 10 mg by mouth daily.     [provider]  aspirin  81 MG tablet Take 81 mg by mouth daily.  Patient not taking: Reported on 11/22/2023    [provider]  atorvastatin  (LIPITOR) 40 MG tablet Take 40 mg by mouth daily.  11/01/15   [provider]  B Complex-C-Folic Acid (DIALYVITE 800) 0.8 MG TABS Dialyvite 800 10/28/19   [provider]  Blood  Glucose Monitoring Suppl (GLUCOCOM BLOOD GLUCOSE MONITOR) DEVI Check blood glucose before meals 05/14/20   [provider]  carvedilol  (COREG ) 25 MG tablet Take 25 mg by mouth 2 (two) times daily with a meal.  02/24/13   [provider]  cinacalcet (SENSIPAR) 30 MG tablet Take 30 mg by mouth daily at 2 PM. 12/05/19   [provider]  cinacalcet (SENSIPAR) 60 MG tablet Take 60 mg by mouth daily. Patient not taking: Reported on 11/22/2023 02/24/20   [provider]  colchicine  0.6 MG tablet Take 1 tablet (0.6 mg total) by mouth as needed (for gout flare. Don't repeat dose for another 2 weeks.). 10/07/19   Cheryle Page, MD  Continuous Glucose Receiver (FREESTYLE LIBRE 2 READER) DEVI Scan as needed for continuous glucose monitoring. 05/26/20   [provider]  Continuous Glucose Receiver (FREESTYLE LIBRE 2 READER) DEVI Scan as needed for continuous glucose monitoring. 05/26/20   [provider]  ferric citrate (AURYXIA) 1 GM 210 MG(Fe) tablet Take by mouth. Patient not taking: Reported on 11/22/2023 10/28/19   [provider]  furosemide  (LASIX ) 40 MG tablet Take 40 mg by mouth daily. Patient not taking: Reported on 11/22/2023 07/20/23   [provider]  insulin aspart (NOVOLOG FLEXPEN) 100 UNIT/ML FlexPen Inject 9 Units into the skin  2 (two) times daily. 03/10/21   [provider]  insulin glargine (LANTUS SOLOSTAR) 100 UNIT/ML Solostar Pen Inject into the skin daily. 10/05/21   [provider]  Insulin Pen Needle (DROPLET PEN NEEDLES) 31G X 8 MM MISC USE AS DIRECTED FOUR TIMES DAILY BEFORE MEALS  AND EVERY NIGHT 11/01/20   [provider]  Insulin Pen Needle (DROPLET PEN NEEDLES) 31G X 8 MM MISC USE AS DIRECTED FOUR TIMES DAILY BEFORE MEALS  AND EVERY NIGHT 11/01/20   [provider]  Insulin Pen Needle (NOVOFINE PEN NEEDLE) 32G X 6 MM MISC as directed; Duration: 90 days 11/20/23   [provider]   levothyroxine  (SYNTHROID ) 25 MCG tablet Take 25 mcg by mouth daily. 08/05/19   [provider]  lidocaine -prilocaine (EMLA) cream Apply 1 Application topically once. Patient not taking: Reported on 11/22/2023 12/30/19   [provider]  magnesium oxide (MAG-OX) 400 MG tablet Take 800 mg by mouth daily. 11/19/20   [provider]  Methoxy PEG-Epoetin  Beta (MIRCERA IJ) Mircera Patient not taking: Reported on 11/22/2023 05/04/20   [provider]  mycophenolate (MYFORTIC) 180 MG EC tablet Take 180 mg by mouth 2 (two) times daily. 07/27/21   [provider]  NIFEdipine (PROCARDIA XL/NIFEDICAL-XL) 90 MG 24 hr tablet Take 1 tablet by mouth daily. 10/05/21   [provider]  oxyCODONE  (ROXICODONE ) 5 MG immediate release tablet Take 1 tablet (5 mg total) by mouth every 4 (four) hours as needed. Patient not taking: Reported on 11/22/2023 10/07/19   Eliza Lonni RAMAN, MD  predniSONE (DELTASONE) 5 MG tablet Take 5 mg by mouth as directed. 02/27/22   [provider]  Semaglutide, 1 MG/DOSE, (OZEMPIC, 1 MG/DOSE,) 4 MG/3ML SOPN Inject 1 mg into the skin. Patient not taking: Reported on 11/22/2023 03/16/21   [provider]  Semaglutide, 2 MG/DOSE, (OZEMPIC, 2 MG/DOSE,) 8 MG/3ML SOPN INJECT 2MG  UNDER THE SKIN ONE TIME WEEKLY; Duration: 78    [provider]  sevelamer carbonate (RENVELA) 800 MG tablet Take 800 mg by mouth 3 (three) times daily with meals. Patient not taking: Reported on 11/22/2023 04/27/20   [provider]  sulfamethoxazole-trimethoprim (BACTRIM) 400-80 MG tablet Take 1 tablet by mouth as directed. Patient not taking: Reported on 11/22/2023 02/27/22   [provider]  tacrolimus  (PROGRAF ) 1 MG capsule Take 1 mg by mouth as directed. 3 in the morning and 2 at night Orally 10/05/21   [provider]  glipiZIDE (GLUCOTROL) 5 MG tablet Take 5 mg by mouth 3 (three) times daily with meals.  09/27/19  [provider]    Allergies: Vancomycin, Ace inhibitors, Morphine and codeine, and Morphine sulfate    Review of Systems  Updated Vital Signs BP (!) 176/106 (BP Location: Left Arm)   Pulse 100   Temp 98.4 F (36.9 C) (Oral)   Resp 16   SpO2 98%   Physical Exam Vitals and nursing note reviewed.  HENT:     Head:     Comments: Occipital tenderness. Eyes:     Extraocular Movements: Extraocular movements intact.  Cardiovascular:     Rate and Rhythm: Regular rhythm.  Pulmonary:     Breath sounds: No wheezing.  Abdominal:     Tenderness: There is no abdominal tenderness.  Musculoskeletal:     Cervical back: Neck supple. No tenderness.     Comments: Tenderness over lumbar spine and posterior pelvis.  Tenderness to left hip laterally.  Tenderness to right wrist  with some deformity/edema.  Also some mild tenderness over right elbow.  No shoulder tenderness.  No thoracic spine tenderness.  No chest tenderness.  Neurological:     Mental Status: She is alert and oriented to person, place, and time.     (all labs ordered are listed, but only abnormal results are displayed) Labs Reviewed - No data to display  EKG: None  Radiology: No results found.   Procedures   Medications Ordered in the ED - No data to display                                  Medical Decision Making Amount and/or Complexity of Data Reviewed Radiology: ordered.   Patient with fall.  Various pains.  Hit head.  Worry for intracranial injury.  Also potential cervical spine injury with patient's age putting higher risk.  Does have pain and tenderness on the left wrist dorsal aspect including snuffbox.  More tenderness over ulnar aspect 2.  Will get x-ray.  Fracture considered.  Also with lumbar and pelvic tenderness along with left hip tenderness.  Will get x-ray of the hip but lumbar and pelvic CT scan.  Care will be turned over to oncoming provider.     Final diagnoses:  Fall, initial encounter     ED Discharge Orders     None          Patsey Lot, MD 11/22/23 1436

## 2023-11-22 NOTE — Progress Notes (Signed)
 Orthopedic Tech Progress Note Patient Details:  Kristen English [birth year] [id number]  Ortho Devices Type of Ortho Device: Sugartong splint, Sling immobilizer Ortho Device/Splint Interventions: Ordered, Application, Adjustment   Post Interventions Patient Tolerated: Well Instructions Provided: Care of device, Adjustment of device Splint applied in best attainable fashion, patient somewhat difficult to splint due to pain and limited range of motion. Attempted to get the wrist in better alignment for splint, however patient states their hand felt stiff and did not want it moved. Larger sling provided as the one from the [previous facility was not long wnough to support her arm. [name] 11/22/2023, 6:03 PM

## 2023-11-23 ENCOUNTER — Telehealth: Payer: Self-pay

## 2023-11-23 NOTE — Telephone Encounter (Signed)
 LVM for pt to call back to get scheduled to see Dr. Erwin on Monday 11/26/23

## 2023-11-26 ENCOUNTER — Encounter (HOSPITAL_BASED_OUTPATIENT_CLINIC_OR_DEPARTMENT_OTHER): Payer: Self-pay | Admitting: Orthopedic Surgery

## 2023-11-26 ENCOUNTER — Ambulatory Visit: Admitting: Orthopedic Surgery

## 2023-11-26 ENCOUNTER — Other Ambulatory Visit: Payer: Self-pay

## 2023-11-26 DIAGNOSIS — S52571A Other intraarticular fracture of lower end of right radius, initial encounter for closed fracture: Secondary | ICD-10-CM | POA: Diagnosis not present

## 2023-11-26 DIAGNOSIS — M25531 Pain in right wrist: Secondary | ICD-10-CM | POA: Diagnosis not present

## 2023-11-26 NOTE — Progress Notes (Signed)
 Kristen English - 68 y.o. female MRN [id number]  Date of birth: 26-Oct-1955  Office Visit Note: Visit Date: 11/26/2023 PCP: Ransom Other, MD Referred by: Ransom Other, MD  Subjective: Chief Complaint  Patient presents with   Right Wrist - Pain   HPI: Kristen English is a pleasant 68 y.o. female who presents today for evaluation of a right wrist injury sustained 4 days prior.  Injury mechanism described as a mechanical fall onto the outstretched right wrist with significant pain and swelling and deformity in this region.  She was seen in the emergency department setting the day of injury, underwent clinical and radiographic workup which showed a right distal radius fracture with notable displacement and intra-articular involvement.  Patient was placed into a splint at that time and given close orthopedic hand surgical follow-up.  She is overall active at baseline.  She is diabetic, insulin-dependent, recent A1c was 8.3.  No blood thinners, non-smoker.  Pertinent ROS were reviewed with the patient and found to be negative unless otherwise specified above in HPI.   Visit Reason: Right wrist fx Duration of symptoms:ED 11/22/23 Hand dominance: right Occupation: N/A Diabetic: Yes Smoking: No Heart/Lung History:No Blood Thinners: No  Prior Testing/EMG: No Injections (Date): No Treatments: Sling & splint at the ED   Assessment & Plan: Visit Diagnoses:  1. Other closed intra-articular fracture of distal end of right radius, initial encounter   2. Pain in right wrist     Plan: Extensive discussion was had with the patient today regarding her right distal radius fracture and ulnar styloid fracture.  We discussed the displaced intra-articular nature of injury at the distal radius, with associated shortening and the associated comminution.  We discussed treatment modalities ranging from conservative to surgical.  From a conservative standpoint, we discussed ongoing immobilization  to allow for ongoing healing in the position it is currently in, however I explained that given the displacement, angulation and shortening of the injury, this would predispose to potential loss of range of motion and function at the wrist level.  From a surgical standpoint, we discussed open reduction internal fixation in order to restore the anatomy, promote healing and early mobilization of the wrist, I also discussed the possibility for ulnar styloid fracture fixation and stabilization of the distal radial ulnar joint as needed.   The benefits of this procedure would be to promote fracture healing by providing stability and to heal the fracture in the appropriate alignment. The alternatives of this surgery would be to treat the fracture with immobilization in a splint/brace/cast or to do no intervention. The patient's questions were answered to satisfaction. After this discussion, patient elected to proceed with surgery. Informed consent was obtained.    Risks and benefits of the procedure were discussed, risks including but not limited to infection, bleeding, scarring, stiffness, nerve injury, tendon injury, vascular injury, hardware complication, recurrence of symptoms and need for subsequent operation.  Patient expressed understanding.  We will move forward with surgical scheduling of right wrist distal radius fracture open reduction internal fixation, possible ulnar styloid fracture fixation.  Surgery will be performed tomorrow in the outpatient setting.   Follow-up: No follow-ups on file.   Meds & Orders: No orders of the defined types were placed in this encounter.   Orders Placed This Encounter  Procedures   Ambulatory referral to Occupational Therapy     Procedures: No procedures performed      Clinical History: No specialty comments available.  She reports  that she has never smoked. She has never used smokeless tobacco. No results for input(s): HGBA1C, LABURIC in the last 8760  hours.  Objective:   Vital Signs: There were no vitals taken for this visit.  Physical Exam  Gen: Well-appearing, in no acute distress; non-toxic CV: Regular Rate. Well-perfused. Warm.  Resp: Breathing unlabored on room air; no wheezing. Psych: Fluid speech in conversation; appropriate affect; normal thought process  Ortho Exam Right wrist: - Skin is intact, minimal swelling, mild ecchymotic staining throughout the volar aspect of the wrist and hand - Sensation intact in the median/radial/ulnar distributions, AIN/PIN/interosseous intact - No significant tenderness over the proximal forearm or elbow region, digital motion is well-preserved  Imaging: No results found. Prior x-rays of the right wrist were reviewed in detail, significantly displaced and comminuted distal radius fracture with intra-articular extension is seen, ulnar styloid fracture also appreciated  Past Medical/Family/Surgical/Social History: Medications & Allergies reviewed per EMR, new medications updated. Patient Active Problem List   Diagnosis Date Noted   Chronic diastolic heart failure (HCC) 11/22/2023   Decreased estrogen level 11/22/2023   Diabetic peripheral neuropathy associated with type 2 diabetes mellitus (HCC) 11/22/2023   Drug-induced immunodeficiency (HCC) 11/22/2023   History of kidney transplant 11/22/2023   History of primary malignant neoplasm of kidney 11/22/2023   Hardening of the aorta (main artery of the heart) (HCC) 11/22/2023   Mixed hyperlipidemia 11/22/2023   Stage 3a chronic kidney disease (HCC) 11/22/2023   Pure hypercholesterolemia 11/22/2023   Anemia 11/22/2023   Stage 4 chronic kidney disease (HCC) 11/22/2023   Stage 5 chronic kidney disease (HCC) 11/22/2023   Diabetic hypoglycemia (HCC) 11/22/2023   Hypomagnesemia 05/18/2021   Weight gain 05/18/2021   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   Encounter for monitoring tacrolimus  therapy 08/04/2020   Uses self-applied  continuous glucose monitoring device 08/04/2020   Acquired hypothyroidism 06/08/2020   Hyperparathyroidism (HCC) 06/08/2020   Perinephric fluid collection of kidney transplant 06/08/2020   Uncontrolled type 2 diabetes mellitus with hyperglycemia (HCC) 06/08/2020   Encounter for aftercare following kidney transplant 05/12/2020   Immunosuppression (HCC) 05/12/2020   Other disorders of phosphorus metabolism 01/01/2020   Other bacterial infections of unspecified site 12/30/2019   Hypokalemia 12/05/2019   Hyperkalemia 11/05/2019   Encounter for immunization 10/18/2019   Underimmunization status 10/18/2019   Allergy, unspecified, sequela 10/10/2019   Anaphylactic shock, unspecified, sequela 10/10/2019   Anemia in chronic kidney disease 10/10/2019   Chest pain, unspecified 10/10/2019   Coagulation defect, unspecified (HCC) 10/10/2019   Complication of vascular dialysis catheter 10/10/2019   Dyspnea, unspecified 10/10/2019   End stage renal disease (HCC) 10/10/2019   Iron deficiency anemia, unspecified 10/10/2019   Moderate protein-calorie malnutrition (HCC) 10/10/2019   Pain, unspecified 10/10/2019   Primary generalized (osteo)arthritis 10/10/2019   Proteinuria, unspecified 10/10/2019   Pruritus, unspecified 10/10/2019   Secondary hyperparathyroidism of renal origin (HCC) 10/10/2019   Type 2 diabetes mellitus with diabetic chronic kidney disease (HCC) 10/10/2019   Acute respiratory failure with hypercapnia (HCC) 10/08/2019   AKI (acute kidney injury) (HCC) 10/02/2019   Essential hypertension 10/02/2019   Normocytic anemia 10/02/2019   Normal anion gap metabolic acidosis 10/02/2019   Hypoglycemia associated with type 2 diabetes mellitus (HCC) 09/27/2019   Neoplasm of uncertain behavior of right kidney 09/08/2019   Renal cell carcinoma, right (HCC) 09/08/2019   Chronic renal insufficiency, stage 4 (severe) (HCC) 09/05/2018   Gout 06/29/2014   Sleep disorder 06/29/2014   Type 2 diabetes  mellitus with  chronic kidney disease, with long-term current use of insulin (HCC) 06/29/2014   HTN (hypertension) 06/29/2014   Monoclonal gammopathy of undetermined significance (MGUS) 04/27/2014   Monoclonal (M) protein disease, 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   Past Medical History:  Diagnosis Date   Arthritis    Chronic kidney disease    protein in urine   Diabetes mellitus without complication (HCC)    Gout    Hypertension    Sleep disorder 06/29/2014   Family History  Problem Relation Age of Onset   Heart attack Mother    Heart attack Father    Past Surgical History:  Procedure Laterality Date   AV FISTULA PLACEMENT Right 10/07/2019   Procedure: ARTERIOVENOUS (AV) FISTULA CREATION RIGHT UPPER ARM;  Surgeon: Eliza Lonni RAMAN, MD;  Location: [facility] OR;  Service: Vascular;  Laterality: Right;   COLONOSCOPY WITH PROPOFOL  N/A 05/11/2014   Procedure: COLONOSCOPY WITH PROPOFOL ;  Surgeon: Gladis MARLA Louder, MD;  Location: [facility];  Service: Endoscopy;  Laterality: N/A;   FOOT SURGERY Left 2007   IR FLUORO GUIDE CV LINE RIGHT  10/03/2019   IR US  GUIDE VASC ACCESS RIGHT  10/03/2019   JOINT REPLACEMENT  2009   bilateral knees   TOTAL KNEE ARTHROPLASTY     bilateral   Social History   Occupational History   Not on file  Tobacco Use   Smoking status: Never   Smokeless tobacco: Never  Vaping Use   Vaping status: Never Used  Substance and Sexual Activity   Alcohol use: No   Drug use: No   Sexual activity: Not Currently    Giomar Gusler Estela) Arlinda, M.D. [facility], Hand Surgery

## 2023-11-26 NOTE — H&P (View-Only) (Signed)
 Kristen English - 68 y.o. female MRN [id number]  Date of birth: 26-Oct-1955  Office Visit Note: Visit Date: 11/26/2023 PCP: Ransom Other, MD Referred by: Ransom Other, MD  Subjective: Chief Complaint  Patient presents with   Right Wrist - Pain   HPI: Kristen English is a pleasant 68 y.o. female who presents today for evaluation of a right wrist injury sustained 4 days prior.  Injury mechanism described as a mechanical fall onto the outstretched right wrist with significant pain and swelling and deformity in this region.  She was seen in the emergency department setting the day of injury, underwent clinical and radiographic workup which showed a right distal radius fracture with notable displacement and intra-articular involvement.  Patient was placed into a splint at that time and given close orthopedic hand surgical follow-up.  She is overall active at baseline.  She is diabetic, insulin-dependent, recent A1c was 8.3.  No blood thinners, non-smoker.  Pertinent ROS were reviewed with the patient and found to be negative unless otherwise specified above in HPI.   Visit Reason: Right wrist fx Duration of symptoms:ED 11/22/23 Hand dominance: right Occupation: N/A Diabetic: Yes Smoking: No Heart/Lung History:No Blood Thinners: No  Prior Testing/EMG: No Injections (Date): No Treatments: Sling & splint at the ED   Assessment & Plan: Visit Diagnoses:  1. Other closed intra-articular fracture of distal end of right radius, initial encounter   2. Pain in right wrist     Plan: Extensive discussion was had with the patient today regarding her right distal radius fracture and ulnar styloid fracture.  We discussed the displaced intra-articular nature of injury at the distal radius, with associated shortening and the associated comminution.  We discussed treatment modalities ranging from conservative to surgical.  From a conservative standpoint, we discussed ongoing immobilization  to allow for ongoing healing in the position it is currently in, however I explained that given the displacement, angulation and shortening of the injury, this would predispose to potential loss of range of motion and function at the wrist level.  From a surgical standpoint, we discussed open reduction internal fixation in order to restore the anatomy, promote healing and early mobilization of the wrist, I also discussed the possibility for ulnar styloid fracture fixation and stabilization of the distal radial ulnar joint as needed.   The benefits of this procedure would be to promote fracture healing by providing stability and to heal the fracture in the appropriate alignment. The alternatives of this surgery would be to treat the fracture with immobilization in a splint/brace/cast or to do no intervention. The patient's questions were answered to satisfaction. After this discussion, patient elected to proceed with surgery. Informed consent was obtained.    Risks and benefits of the procedure were discussed, risks including but not limited to infection, bleeding, scarring, stiffness, nerve injury, tendon injury, vascular injury, hardware complication, recurrence of symptoms and need for subsequent operation.  Patient expressed understanding.  We will move forward with surgical scheduling of right wrist distal radius fracture open reduction internal fixation, possible ulnar styloid fracture fixation.  Surgery will be performed tomorrow in the outpatient setting.   Follow-up: No follow-ups on file.   Meds & Orders: No orders of the defined types were placed in this encounter.   Orders Placed This Encounter  Procedures   Ambulatory referral to Occupational Therapy     Procedures: No procedures performed      Clinical History: No specialty comments available.  She reports  that she has never smoked. She has never used smokeless tobacco. No results for input(s): HGBA1C, LABURIC in the last 8760  hours.  Objective:   Vital Signs: There were no vitals taken for this visit.  Physical Exam  Gen: Well-appearing, in no acute distress; non-toxic CV: Regular Rate. Well-perfused. Warm.  Resp: Breathing unlabored on room air; no wheezing. Psych: Fluid speech in conversation; appropriate affect; normal thought process  Ortho Exam Right wrist: - Skin is intact, minimal swelling, mild ecchymotic staining throughout the volar aspect of the wrist and hand - Sensation intact in the median/radial/ulnar distributions, AIN/PIN/interosseous intact - No significant tenderness over the proximal forearm or elbow region, digital motion is well-preserved  Imaging: No results found. Prior x-rays of the right wrist were reviewed in detail, significantly displaced and comminuted distal radius fracture with intra-articular extension is seen, ulnar styloid fracture also appreciated  Past Medical/Family/Surgical/Social History: Medications & Allergies reviewed per EMR, new medications updated. Patient Active Problem List   Diagnosis Date Noted   Chronic diastolic heart failure (HCC) 11/22/2023   Decreased estrogen level 11/22/2023   Diabetic peripheral neuropathy associated with type 2 diabetes mellitus (HCC) 11/22/2023   Drug-induced immunodeficiency (HCC) 11/22/2023   History of kidney transplant 11/22/2023   History of primary malignant neoplasm of kidney 11/22/2023   Hardening of the aorta (main artery of the heart) (HCC) 11/22/2023   Mixed hyperlipidemia 11/22/2023   Stage 3a chronic kidney disease (HCC) 11/22/2023   Pure hypercholesterolemia 11/22/2023   Anemia 11/22/2023   Stage 4 chronic kidney disease (HCC) 11/22/2023   Stage 5 chronic kidney disease (HCC) 11/22/2023   Diabetic hypoglycemia (HCC) 11/22/2023   Hypomagnesemia 05/18/2021   Weight gain 05/18/2021   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   Encounter for monitoring tacrolimus  therapy 08/04/2020   Uses self-applied  continuous glucose monitoring device 08/04/2020   Acquired hypothyroidism 06/08/2020   Hyperparathyroidism (HCC) 06/08/2020   Perinephric fluid collection of kidney transplant 06/08/2020   Uncontrolled type 2 diabetes mellitus with hyperglycemia (HCC) 06/08/2020   Encounter for aftercare following kidney transplant 05/12/2020   Immunosuppression (HCC) 05/12/2020   Other disorders of phosphorus metabolism 01/01/2020   Other bacterial infections of unspecified site 12/30/2019   Hypokalemia 12/05/2019   Hyperkalemia 11/05/2019   Encounter for immunization 10/18/2019   Underimmunization status 10/18/2019   Allergy, unspecified, sequela 10/10/2019   Anaphylactic shock, unspecified, sequela 10/10/2019   Anemia in chronic kidney disease 10/10/2019   Chest pain, unspecified 10/10/2019   Coagulation defect, unspecified (HCC) 10/10/2019   Complication of vascular dialysis catheter 10/10/2019   Dyspnea, unspecified 10/10/2019   End stage renal disease (HCC) 10/10/2019   Iron deficiency anemia, unspecified 10/10/2019   Moderate protein-calorie malnutrition (HCC) 10/10/2019   Pain, unspecified 10/10/2019   Primary generalized (osteo)arthritis 10/10/2019   Proteinuria, unspecified 10/10/2019   Pruritus, unspecified 10/10/2019   Secondary hyperparathyroidism of renal origin (HCC) 10/10/2019   Type 2 diabetes mellitus with diabetic chronic kidney disease (HCC) 10/10/2019   Acute respiratory failure with hypercapnia (HCC) 10/08/2019   AKI (acute kidney injury) (HCC) 10/02/2019   Essential hypertension 10/02/2019   Normocytic anemia 10/02/2019   Normal anion gap metabolic acidosis 10/02/2019   Hypoglycemia associated with type 2 diabetes mellitus (HCC) 09/27/2019   Neoplasm of uncertain behavior of right kidney 09/08/2019   Renal cell carcinoma, right (HCC) 09/08/2019   Chronic renal insufficiency, stage 4 (severe) (HCC) 09/05/2018   Gout 06/29/2014   Sleep disorder 06/29/2014   Type 2 diabetes  mellitus with  chronic kidney disease, with long-term current use of insulin (HCC) 06/29/2014   HTN (hypertension) 06/29/2014   Monoclonal gammopathy of undetermined significance (MGUS) 04/27/2014   Monoclonal (M) protein disease, 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   Past Medical History:  Diagnosis Date   Arthritis    Chronic kidney disease    protein in urine   Diabetes mellitus without complication (HCC)    Gout    Hypertension    Sleep disorder 06/29/2014   Family History  Problem Relation Age of Onset   Heart attack Mother    Heart attack Father    Past Surgical History:  Procedure Laterality Date   AV FISTULA PLACEMENT Right 10/07/2019   Procedure: ARTERIOVENOUS (AV) FISTULA CREATION RIGHT UPPER ARM;  Surgeon: Eliza Lonni RAMAN, MD;  Location: [facility] OR;  Service: Vascular;  Laterality: Right;   COLONOSCOPY WITH PROPOFOL  N/A 05/11/2014   Procedure: COLONOSCOPY WITH PROPOFOL ;  Surgeon: Gladis MARLA Louder, MD;  Location: [facility];  Service: Endoscopy;  Laterality: N/A;   FOOT SURGERY Left 2007   IR FLUORO GUIDE CV LINE RIGHT  10/03/2019   IR US  GUIDE VASC ACCESS RIGHT  10/03/2019   JOINT REPLACEMENT  2009   bilateral knees   TOTAL KNEE ARTHROPLASTY     bilateral   Social History   Occupational History   Not on file  Tobacco Use   Smoking status: Never   Smokeless tobacco: Never  Vaping Use   Vaping status: Never Used  Substance and Sexual Activity   Alcohol use: No   Drug use: No   Sexual activity: Not Currently    Giomar Gusler Estela) Arlinda, M.D. [facility], Hand Surgery

## 2023-11-27 ENCOUNTER — Ambulatory Visit (HOSPITAL_BASED_OUTPATIENT_CLINIC_OR_DEPARTMENT_OTHER): Payer: Self-pay | Admitting: Anesthesiology

## 2023-11-27 ENCOUNTER — Encounter (HOSPITAL_BASED_OUTPATIENT_CLINIC_OR_DEPARTMENT_OTHER)
Admission: RE | Disposition: A | Discharge status: Home / Self Care | Payer: Self-pay | Source: Home / Self Care | Attending: Orthopedic Surgery

## 2023-11-27 ENCOUNTER — Ambulatory Visit (HOSPITAL_BASED_OUTPATIENT_CLINIC_OR_DEPARTMENT_OTHER)
Admission: RE | Admit: 2023-11-27 | Discharge: 2023-11-27 | Disposition: A | Discharge status: Home / Self Care | Attending: Orthopedic Surgery | Admitting: Orthopedic Surgery

## 2023-11-27 ENCOUNTER — Encounter (HOSPITAL_BASED_OUTPATIENT_CLINIC_OR_DEPARTMENT_OTHER): Payer: Self-pay | Admitting: Orthopedic Surgery

## 2023-11-27 ENCOUNTER — Other Ambulatory Visit: Payer: Self-pay

## 2023-11-27 ENCOUNTER — Ambulatory Visit (HOSPITAL_BASED_OUTPATIENT_CLINIC_OR_DEPARTMENT_OTHER)

## 2023-11-27 DIAGNOSIS — D631 Anemia in chronic kidney disease: Secondary | ICD-10-CM | POA: Insufficient documentation

## 2023-11-27 DIAGNOSIS — W19XXXA Unspecified fall, initial encounter: Secondary | ICD-10-CM | POA: Diagnosis not present

## 2023-11-27 DIAGNOSIS — Z01818 Encounter for other preprocedural examination: Secondary | ICD-10-CM

## 2023-11-27 DIAGNOSIS — S52611A Displaced fracture of right ulna styloid process, initial encounter for closed fracture: Secondary | ICD-10-CM | POA: Diagnosis not present

## 2023-11-27 DIAGNOSIS — Z7985 Long-term (current) use of injectable non-insulin antidiabetic drugs: Secondary | ICD-10-CM | POA: Insufficient documentation

## 2023-11-27 DIAGNOSIS — E1142 Type 2 diabetes mellitus with diabetic polyneuropathy: Secondary | ICD-10-CM | POA: Diagnosis not present

## 2023-11-27 DIAGNOSIS — N186 End stage renal disease: Secondary | ICD-10-CM | POA: Insufficient documentation

## 2023-11-27 DIAGNOSIS — E1122 Type 2 diabetes mellitus with diabetic chronic kidney disease: Secondary | ICD-10-CM | POA: Diagnosis not present

## 2023-11-27 DIAGNOSIS — I10 Essential (primary) hypertension: Secondary | ICD-10-CM | POA: Diagnosis not present

## 2023-11-27 DIAGNOSIS — Z794 Long term (current) use of insulin: Secondary | ICD-10-CM | POA: Insufficient documentation

## 2023-11-27 DIAGNOSIS — S52501A Unspecified fracture of the lower end of right radius, initial encounter for closed fracture: Secondary | ICD-10-CM

## 2023-11-27 DIAGNOSIS — E039 Hypothyroidism, unspecified: Secondary | ICD-10-CM | POA: Diagnosis not present

## 2023-11-27 DIAGNOSIS — N185 Chronic kidney disease, stage 5: Secondary | ICD-10-CM

## 2023-11-27 DIAGNOSIS — I5032 Chronic diastolic (congestive) heart failure: Secondary | ICD-10-CM | POA: Insufficient documentation

## 2023-11-27 DIAGNOSIS — G8918 Other acute postprocedural pain: Secondary | ICD-10-CM | POA: Diagnosis not present

## 2023-11-27 DIAGNOSIS — I132 Hypertensive heart and chronic kidney disease with heart failure and with stage 5 chronic kidney disease, or end stage renal disease: Secondary | ICD-10-CM | POA: Insufficient documentation

## 2023-11-27 DIAGNOSIS — Z940 Kidney transplant status: Secondary | ICD-10-CM | POA: Diagnosis not present

## 2023-11-27 DIAGNOSIS — S52511A Displaced fracture of right radial styloid process, initial encounter for closed fracture: Secondary | ICD-10-CM | POA: Insufficient documentation

## 2023-11-27 DIAGNOSIS — S52571A Other intraarticular fracture of lower end of right radius, initial encounter for closed fracture: Secondary | ICD-10-CM

## 2023-11-27 DIAGNOSIS — I120 Hypertensive chronic kidney disease with stage 5 chronic kidney disease or end stage renal disease: Secondary | ICD-10-CM | POA: Diagnosis not present

## 2023-11-27 HISTORY — PX: OPEN REDUCTION INTERNAL FIXATION (ORIF) DISTAL RADIAL FRACTURE: SHX5989

## 2023-11-27 LAB — BASIC METABOLIC PANEL WITH GFR
Anion gap: 11 (ref 5–15)
BUN: 19 mg/dL (ref 8–23)
CO2: 26 mmol/L (ref 22–32)
Calcium: 10.4 mg/dL — ABNORMAL HIGH (ref 8.9–10.3)
Chloride: 106 mmol/L (ref 98–111)
Creatinine, Ser: 1.26 mg/dL — ABNORMAL HIGH (ref 0.44–1.00)
GFR, Estimated: 46 mL/min — ABNORMAL LOW (ref >60)
Glucose, Bld: 141 mg/dL — ABNORMAL HIGH (ref 70–99)
Potassium: 3.6 mmol/L (ref 3.5–5.1)
Sodium: 143 mmol/L (ref 135–145)

## 2023-11-27 LAB — GLUCOSE, CAPILLARY
Glucose-Capillary: 134 mg/dL — ABNORMAL HIGH (ref 70–99)
Glucose-Capillary: 143 mg/dL — ABNORMAL HIGH (ref 70–99)

## 2023-11-27 SURGERY — OPEN REDUCTION INTERNAL FIXATION (ORIF) DISTAL RADIUS FRACTURE
Anesthesia: General | Site: Wrist | Laterality: Right

## 2023-11-27 MED ORDER — ACETAMINOPHEN 10 MG/ML IV SOLN: 1000.0000 mg | Freq: Once | INTRAVENOUS | Status: DC | PRN

## 2023-11-27 MED ORDER — LACTATED RINGERS IV SOLN: INTRAVENOUS | Status: DC | PRN

## 2023-11-27 MED ORDER — MIDAZOLAM HCL 2 MG/2ML IJ SOLN
INTRAMUSCULAR | Status: AC
  Filled 2023-11-27: qty 2

## 2023-11-27 MED ORDER — OXYCODONE HCL 5 MG/5ML PO SOLN: 5.0000 mg | Freq: Once | ORAL | Status: DC | PRN

## 2023-11-27 MED ORDER — FENTANYL CITRATE (PF) 100 MCG/2ML IJ SOLN
INTRAMUSCULAR | Status: DC | PRN
  Administered 2023-11-27 (×2): 50 ug via INTRAVENOUS

## 2023-11-27 MED ORDER — OXYCODONE HCL 5 MG PO TABS: 5.0000 mg | ORAL_TABLET | Freq: Four times a day (QID) | ORAL | 0 refills | Status: AC | PRN

## 2023-11-27 MED ORDER — SUCCINYLCHOLINE CHLORIDE 200 MG/10ML IV SOSY
PREFILLED_SYRINGE | INTRAVENOUS | Status: DC | PRN
  Administered 2023-11-27: 120 mg via INTRAVENOUS

## 2023-11-27 MED ORDER — 0.9 % SODIUM CHLORIDE (POUR BTL) OPTIME
TOPICAL | Status: DC | PRN
  Administered 2023-11-27: 500 mL

## 2023-11-27 MED ORDER — VANCOMYCIN HCL 500 MG IV SOLR
INTRAVENOUS | Status: AC
  Filled 2023-11-27: qty 10

## 2023-11-27 MED ORDER — CEFAZOLIN SODIUM-DEXTROSE 2-4 GM/100ML-% IV SOLN
2.0000 g | INTRAVENOUS | Status: AC
  Administered 2023-11-27: 2 g via INTRAVENOUS

## 2023-11-27 MED ORDER — FENTANYL CITRATE (PF) 100 MCG/2ML IJ SOLN
INTRAMUSCULAR | Status: AC
  Filled 2023-11-27: qty 2

## 2023-11-27 MED ORDER — FENTANYL CITRATE (PF) 100 MCG/2ML IJ SOLN: 25.0000 ug | INTRAMUSCULAR | Status: DC | PRN

## 2023-11-27 MED ORDER — PROPOFOL 10 MG/ML IV BOLUS
INTRAVENOUS | Status: DC | PRN
  Administered 2023-11-27: 140 mg via INTRAVENOUS

## 2023-11-27 MED ORDER — LIDOCAINE 2% (20 MG/ML) 5 ML SYRINGE
INTRAMUSCULAR | Status: DC | PRN
  Administered 2023-11-27: 40 mg via INTRAVENOUS

## 2023-11-27 MED ORDER — LIDOCAINE 2% (20 MG/ML) 5 ML SYRINGE
INTRAMUSCULAR | Status: AC
Start: 2023-11-27 — End: 2023-11-27
  Filled 2023-11-27: qty 5

## 2023-11-27 MED ORDER — LACTATED RINGERS IV SOLN: INTRAVENOUS | Status: DC

## 2023-11-27 MED ORDER — DROPERIDOL 2.5 MG/ML IJ SOLN: 0.6250 mg | Freq: Once | INTRAMUSCULAR | Status: DC | PRN

## 2023-11-27 MED ORDER — FENTANYL CITRATE (PF) 100 MCG/2ML IJ SOLN
50.0000 ug | Freq: Once | INTRAMUSCULAR | Status: AC
  Administered 2023-11-27: 50 ug via INTRAVENOUS

## 2023-11-27 MED ORDER — BUPIVACAINE-EPINEPHRINE (PF) 0.5% -1:200000 IJ SOLN
INTRAMUSCULAR | Status: DC | PRN
  Administered 2023-11-27: 30 mL via PERINEURAL

## 2023-11-27 MED ORDER — PHENYLEPHRINE 80 MCG/ML (10ML) SYRINGE FOR IV PUSH (FOR BLOOD PRESSURE SUPPORT)
PREFILLED_SYRINGE | INTRAVENOUS | Status: DC | PRN
  Administered 2023-11-27: 160 ug via INTRAVENOUS
  Administered 2023-11-27: 80 ug via INTRAVENOUS

## 2023-11-27 MED ORDER — CEFAZOLIN SODIUM-DEXTROSE 2-4 GM/100ML-% IV SOLN
INTRAVENOUS | Status: AC
  Filled 2023-11-27: qty 100

## 2023-11-27 MED ORDER — PROPOFOL 500 MG/50ML IV EMUL
INTRAVENOUS | Status: DC | PRN
  Administered 2023-11-27: 80 ug/kg/min via INTRAVENOUS

## 2023-11-27 MED ORDER — SUCCINYLCHOLINE CHLORIDE 200 MG/10ML IV SOSY
PREFILLED_SYRINGE | INTRAVENOUS | Status: AC
Start: 2023-11-27 — End: 2023-11-27
  Filled 2023-11-27: qty 10

## 2023-11-27 MED ORDER — PHENYLEPHRINE HCL-NACL 20-0.9 MG/250ML-% IV SOLN
INTRAVENOUS | Status: DC | PRN
  Administered 2023-11-27: 20 ug/min via INTRAVENOUS

## 2023-11-27 MED ORDER — EPHEDRINE 5 MG/ML INJ
INTRAVENOUS | Status: AC
Start: 2023-11-27 — End: 2023-11-27
  Filled 2023-11-27: qty 5

## 2023-11-27 MED ORDER — OXYCODONE HCL 5 MG PO TABS: 5.0000 mg | ORAL_TABLET | Freq: Once | ORAL | Status: DC | PRN

## 2023-11-27 SURGICAL SUPPLY — 64 items
BIT DRILL SOLID 2.0X40MM (BIT) IMPLANT
BIT DRILL SOLID 2.5X40MM (BIT) IMPLANT
BLADE SURG 15 STRL LF DISP TIS (BLADE) ×2 IMPLANT
BNDG COHESIVE 4X5 TAN STRL LF (GAUZE/BANDAGES/DRESSINGS) ×1 IMPLANT
BNDG COMPR ESMARK 4X3 LF (GAUZE/BANDAGES/DRESSINGS) ×1 IMPLANT
BNDG ELASTIC 4INX 5YD STR LF (GAUZE/BANDAGES/DRESSINGS) ×2 IMPLANT
BNDG GAUZE DERMACEA FLUFF 4 (GAUZE/BANDAGES/DRESSINGS) ×1 IMPLANT
CHLORAPREP W/TINT 26 (MISCELLANEOUS) ×1 IMPLANT
CORD BIPOLAR FORCEPS 12FT (ELECTRODE) ×1 IMPLANT
COVER BACK TABLE 60X90IN (DRAPES) ×1 IMPLANT
CUFF TOURN SGL QUICK 18X4 (TOURNIQUET CUFF) ×1 IMPLANT
CUFF TRNQT CYL 24X4X16.5-23 (TOURNIQUET CUFF) IMPLANT
DERMABOND ADVANCED .7 DNX12 (GAUZE/BANDAGES/DRESSINGS) IMPLANT
DRAPE HAND 77X146 (DRAPES) ×1 IMPLANT
DRAPE OEC MINIVIEW 54X84 (DRAPES) ×1 IMPLANT
DRAPE SURG 17X23 STRL (DRAPES) ×1 IMPLANT
DRIVER QUICK CONNECT T10 (MISCELLANEOUS) IMPLANT
EXTENSION HOSE W/PLC CONNECTON (MISCELLANEOUS) IMPLANT
GAUZE PAD ABD 8X10 STRL (GAUZE/BANDAGES/DRESSINGS) ×1 IMPLANT
GAUZE SPONGE 4X4 12PLY STRL (GAUZE/BANDAGES/DRESSINGS) ×1 IMPLANT
GAUZE XEROFORM 1X8 LF (GAUZE/BANDAGES/DRESSINGS) ×1 IMPLANT
GLOVE BIO SURGEON STRL SZ7.5 (GLOVE) ×2 IMPLANT
GLOVE BIOGEL PI IND STRL 7.5 (GLOVE) ×2 IMPLANT
GOWN STRL REUS W/ TWL LRG LVL3 (GOWN DISPOSABLE) ×1 IMPLANT
GOWN STRL SURGICAL XL XLNG (GOWN DISPOSABLE) ×2 IMPLANT
GUIDE AIMING 1.5MM (WIRE) IMPLANT
MANIFOLD NEPTUNE II (INSTRUMENTS) ×1 IMPLANT
NDL HYPO 25X1 1.5 SAFETY (NEEDLE) IMPLANT
NDL HYPO 25X5/8 SAFETYGLIDE (NEEDLE) IMPLANT
NEEDLE HYPO 25X1 1.5 SAFETY (NEEDLE) IMPLANT
NEEDLE HYPO 25X5/8 SAFETYGLIDE (NEEDLE) IMPLANT
NS IRRIG 1000ML POUR BTL (IV SOLUTION) IMPLANT
PACK BASIN DAY SURGERY FS (CUSTOM PROCEDURE TRAY) ×1 IMPLANT
PAD CAST 4YDX4 CTTN HI CHSV (CAST SUPPLIES) ×2 IMPLANT
PEG GEMINUS THRD LOCK 2.3X19 (Peg) IMPLANT
PEG GEMINUS THRD LOCK 2.3X20 (Peg) IMPLANT
PEG GEMINUS THRD LOCK 2.3X22 (Peg) IMPLANT
PEG GEMINUS THRD TPNL 2.7X26 (Peg) IMPLANT
PEG NON LOCK THREAD 2.7X24MM (Screw) IMPLANT
PEG SMOOTH THREADED 2.3X21 (Peg) IMPLANT
PEG THREADED LOCK 2.3X18 (Screw) IMPLANT
PLATE DISTAL RADIUS 4H (Plate) IMPLANT
SCREW CORT LOCK 3.5X12 TI (Screw) IMPLANT
SCREW POLY NON LOCK 3.5MMX12MM (Screw) IMPLANT
SCREWDRIVER SURG ST 2 (INSTRUMENTS) IMPLANT
SHEET MEDIUM DRAPE 40X70 STRL (DRAPES) ×1 IMPLANT
SLEEVE SCD COMPRESS KNEE MED (STOCKING) IMPLANT
SLING ARM FOAM STRAP LRG (SOFTGOODS) IMPLANT
SPIKE FLUID TRANSFER (MISCELLANEOUS) IMPLANT
SPLINT PLASTER CAST XFAST 4X15 (CAST SUPPLIES) ×10 IMPLANT
SPONGE T-LAP 4X18 ~~LOC~~+RFID (SPONGE) IMPLANT
STOCKINETTE IMPERVIOUS 9X36 MD (GAUZE/BANDAGES/DRESSINGS) ×1 IMPLANT
SUCTION TUBE FRAZIER 10FR DISP (SUCTIONS) ×1 IMPLANT
SUT ETHILON 4 0 PS 2 18 (SUTURE) ×1 IMPLANT
SUT MNCRL AB 3-0 PS2 18 (SUTURE) IMPLANT
SUT MNCRL AB 3-0 PS2 27 (SUTURE) ×1 IMPLANT
SUT MNCRL AB 4-0 PS2 18 (SUTURE) IMPLANT
SUT VIC AB 4-0 PS2 18 (SUTURE) IMPLANT
SYR BULB EAR ULCER 3OZ GRN STR (SYRINGE) ×2 IMPLANT
SYR CONTROL 10ML LL (SYRINGE) IMPLANT
TOWEL GREEN STERILE FF (TOWEL DISPOSABLE) ×2 IMPLANT
TUBE CONNECTING 20X1/4 (TUBING) ×1 IMPLANT
UNDERPAD 30X36 HEAVY ABSORB (UNDERPADS AND DIAPERS) ×1 IMPLANT
WIRE FIX 1.5 STD TIP (WIRE) IMPLANT

## 2023-11-27 NOTE — Transfer of Care (Signed)
 Immediate Anesthesia Transfer of Care Note  Patient: Kristen English  Procedure(s) Performed: OPEN REDUCTION INTERNAL FIXATION (ORIF) DISTAL RADIUS FRACTURE (Right: Wrist)  Patient Location: PACU  Anesthesia Type:GA combined with regional for post-op pain  Level of Consciousness: awake, alert , oriented, and patient cooperative  Airway & Oxygen  Therapy: Patient Spontanous Breathing and Patient connected to face mask oxygen   Post-op Assessment: Report given to RN and Post -op Vital signs reviewed and stable  Post vital signs: Reviewed and stable  Last Vitals:  Vitals Value Taken Time  BP 154/84 11/27/23 17:01  Temp    Pulse 83 11/27/23 17:06  Resp 15 11/27/23 17:06  SpO2 100 % 11/27/23 17:06  Vitals shown include unfiled device data.  Last Pain:  Vitals:   11/27/23 1136  TempSrc: Temporal         Complications: No notable events documented.

## 2023-11-27 NOTE — Anesthesia Procedure Notes (Signed)
 Anesthesia Regional Block: Supraclavicular block   Pre-Anesthetic Checklist: , timeout performed,  Correct Patient, Correct Site, Correct Laterality,  Correct Procedure, Correct Position, site marked,  Risks and benefits discussed,  Surgical consent,  Pre-op evaluation,  At surgeon's request and post-op pain management  Laterality: Right  Prep: chloraprep       Needles:  Injection technique: Single-shot  Needle Type: Echogenic Needle     Needle Length: 5cm  Needle Gauge: 21     Additional Needles:   Narrative:  Start time: 11/27/2023 12:51 PM End time: 11/27/2023 12:54 PM Injection made incrementally with aspirations every 5 mL.  Performed by: Personally  Anesthesiologist: Lucious Debby BRAVO, MD  Additional Notes: No pain on injection. No increased resistance to injection. Injection made in 5cc increments. Good needle visualization. Patient tolerated the procedure well.

## 2023-11-27 NOTE — Progress Notes (Signed)
Assisted Dr. Brock with right, supraclavicular, ultrasound guided block. Side rails up, monitors on throughout procedure. See vital signs in flow sheet. Tolerated Procedure well. 

## 2023-11-27 NOTE — Discharge Instructions (Signed)
  Post Anesthesia Home Care Instructions  Activity: Get plenty of rest for the remainder of the day. A responsible individual must stay with you for 24 hours following the procedure.  For the next 24 hours, DO NOT: -Drive a car -Advertising copywriter -Drink alcoholic beverages -Take any medication unless instructed by your physician -Make any legal decisions or sign important papers.  Meals: Start with liquid foods such as gelatin or soup. Progress to regular foods as tolerated. Avoid greasy, spicy, heavy foods. If nausea and/or vomiting occur, drink only clear liquids until the nausea and/or vomiting subsides. Call your physician if vomiting continues.  Special Instructions/Symptoms: Your throat may feel dry or sore from the anesthesia or the breathing tube placed in your throat during surgery. If this causes discomfort, gargle with warm salt water. The discomfort should disappear within 24 hours.  If you had a scopolamine patch placed behind your ear for the management of post- operative nausea and/or vomiting:  1. The medication in the patch is effective for 72 hours, after which it should be removed.  Wrap patch in a tissue and discard in the trash. Wash hands thoroughly with soap and water. 2. You may remove the patch earlier than 72 hours if you experience unpleasant side effects which may include dry mouth, dizziness or visual disturbances. 3. Avoid touching the patch. Wash your hands with soap and water after contact with the patch.    Regional Anesthesia Blocks  1. You may not be able to move or feel the "blocked" extremity after a regional anesthetic block. This may last may last from 3-48 hours after placement, but it will go away. The length of time depends on the medication injected and your individual response to the medication. As the nerves start to wake up, you may experience tingling as the movement and feeling returns to your extremity. If the numbness and inability to move  your extremity has not gone away after 48 hours, please call your surgeon.   2. The extremity that is blocked will need to be protected until the numbness is gone and the strength has returned. Because you cannot feel it, you will need to take extra care to avoid injury. Because it may be weak, you may have difficulty moving it or using it. You may not know what position it is in without looking at it while the block is in effect.  3. For blocks in the legs and feet, returning to weight bearing and walking needs to be done carefully. You will need to wait until the numbness is entirely gone and the strength has returned. You should be able to move your leg and foot normally before you try and bear weight or walk. You will need someone to be with you when you first try to ensure you do not fall and possibly risk injury.  4. Bruising and tenderness at the needle site are common side effects and will resolve in a few days.  5. Persistent numbness or new problems with movement should be communicated to the surgeon or the [facility] [phone] [facility] [phone]).

## 2023-11-27 NOTE — Anesthesia Postprocedure Evaluation (Signed)
 Anesthesia Post Note  Patient: Kristen English  Procedure(s) Performed: OPEN REDUCTION INTERNAL FIXATION (ORIF) DISTAL RADIUS FRACTURE (Right: Wrist)     Patient location during evaluation: PACU Anesthesia Type: General Level of consciousness: awake and alert Pain management: pain level controlled Vital Signs Assessment: post-procedure vital signs reviewed and stable Respiratory status: spontaneous breathing, nonlabored ventilation, respiratory function stable and patient connected to nasal cannula oxygen  Cardiovascular status: blood pressure returned to baseline and stable Postop Assessment: no apparent nausea or vomiting Anesthetic complications: no   No notable events documented.  Last Vitals:  Vitals:   11/27/23 1715 11/27/23 1725  BP: 139/68 135/68  Pulse: 84 81  Resp: 15 (!) 24  Temp:    SpO2: 93% 94%    Last Pain:  Vitals:   11/27/23 1725  TempSrc:   PainSc: 0-No pain                 [name]

## 2023-11-27 NOTE — Anesthesia Procedure Notes (Signed)
 Procedure Name: Intubation Date/Time: 11/27/2023 2:13 PM  Performed by: Denton Niels CROME, CRNAPre-anesthesia Checklist: Patient identified, Emergency Drugs available, Suction available and Patient being monitored Patient Re-evaluated:Patient Re-evaluated prior to induction Oxygen  Delivery Method: Circle system utilized Preoxygenation: Pre-oxygenation with 100% oxygen  Induction Type: IV induction, Rapid sequence and Cricoid Pressure applied Laryngoscope Size: Mac and 3 Grade View: Grade II Tube type: Oral Tube size: 7.0 mm Number of attempts: 1 Airway Equipment and Method: Stylet Placement Confirmation: ETT inserted through vocal cords under direct vision, positive ETCO2 and breath sounds checked- equal and bilateral Secured at: 22 cm Tube secured with: Tape Dental Injury: Teeth and Oropharynx as per pre-operative assessment  Comments: RSI w CCP : Patient discontinued GLP-1 Sunday 11/25/2022

## 2023-11-27 NOTE — Anesthesia Preprocedure Evaluation (Addendum)
 Anesthesia Evaluation  Patient identified by MRN, date of birth, ID band Patient awake    Reviewed: Allergy & Precautions, NPO status , Patient's Chart, lab work & pertinent test results, reviewed documented beta blocker date and time   History of Anesthesia Complications Negative for: history of anesthetic complications  Airway Mallampati: I  TM Distance: >3 FB Neck ROM: Full    Dental  (+) Dental Advisory Given, Partial Upper   Pulmonary neg pulmonary ROS   Pulmonary exam normal        Cardiovascular hypertension, Pt. on medications and Pt. on home beta blockers Normal cardiovascular exam     Neuro/Psych  Neuromuscular disease  negative psych ROS   GI/Hepatic negative GI ROS, Neg liver ROS,,,  Endo/Other  diabetes, Type 2, Insulin DependentHypothyroidism   Obesity   Renal/GU CRFRenal disease S/p kidney transplant      Musculoskeletal  (+) Arthritis ,    Abdominal  (+) + obese  Peds  Hematology negative hematology ROS (+)   Anesthesia Other Findings On GLP-1a, last dose 2 days ago   Reproductive/Obstetrics                              Anesthesia Physical Anesthesia Plan  ASA: 3  Anesthesia Plan: General   Post-op Pain Management: Tylenol  PO (pre-op)* and Regional block*   Induction: Intravenous and Rapid sequence  PONV Risk Score and Plan: 3 and Treatment may vary due to age or medical condition, Ondansetron  and Dexamethasone   Airway Management Planned: Oral ETT  Additional Equipment: None  Intra-op Plan:   Post-operative Plan: Extubation in OR  Informed Consent: I have reviewed the patients History and Physical, chart, labs and discussed the procedure including the risks, benefits and alternatives for the proposed anesthesia with the patient or authorized representative who has indicated his/her understanding and acceptance.     Dental advisory given  Plan  Discussed with: CRNA and Anesthesiologist  Anesthesia Plan Comments: (ETT with RSI due to recent GLP-1a)         Anesthesia Quick Evaluation

## 2023-11-27 NOTE — Interval H&P Note (Signed)
 History and Physical Interval Note:  11/27/2023 12:14 PM  Kristen English  has presented today for surgery, with the diagnosis of RIGHT DISTAL RADIUS FRACTURE.  The various methods of treatment have been discussed with the patient and family. After consideration of risks, benefits and other options for treatment, the patient has consented to  Procedure(s): OPEN REDUCTION INTERNAL FIXATION (ORIF) DISTAL RADIUS FRACTURE (Right) as a surgical intervention.  The patient's history has been reviewed, patient examined, no change in status, stable for surgery.  I have reviewed the patient's chart and labs.  Questions were answered to the patient's satisfaction.     Jonerik Sliker

## 2023-11-27 NOTE — Op Note (Signed)
 NAME: Kristen English MEDICAL RECORD NO: [id number] DATE OF BIRTH: 06-04-1955 FACILITY: [facility] LOCATION: [facility] PHYSICIAN: GILDARDO ALDERTON, MD   OPERATIVE REPORT   DATE OF PROCEDURE: 11/27/23    PREOPERATIVE DIAGNOSIS: Right distal radius fracture, intra-articular with multiple fragments   POSTOPERATIVE DIAGNOSIS: Right distal radius fracture, intra-articular with 3+ fragments   PROCEDURE: Right distal radius fracture open reduction internal fixation, intra-articular 3+ fragments Closed treatment of ulnar styloid fracture   SURGEON:  GILDARDO ALDERTON, M.D.   ASSISTANT: Almeda Rummer, PA   ANESTHESIA:  General with regional   INTRAVENOUS FLUIDS:  Per anesthesia flow sheet.   ESTIMATED BLOOD LOSS:  Minimal.   COMPLICATIONS:  None.   SPECIMENS:  none   TOURNIQUET TIME:    Total Tourniquet Time Documented: Upper Arm (Right) - 83 minutes Total: Upper Arm (Right) - 83 minutes    DISPOSITION:  Stable to PACU.   INDICATIONS: 68 year old female who sustained a right distal radius fracture after mechanical fall.  She was seen in the emergency department setting, underwent clinical and radiographic workup which showed a displaced, comminuted intra-articular fracture of the right distal radius fracture.  In order to restore anatomy, promote healing and early mobilization, patient was indicated for right distal radius fracture open reduction internal fixation.  Risks and benefits of surgery were discussed including the risks of infection, bleeding, scarring, stiffness, nerve injury, vascular injury, tendon injury, need for subsequent operation, hardware failure, nonunion, malunion.  She voiced understanding of these risks and elected to proceed.  OPERATIVE COURSE: Patient was seen and identified in the preoperative area and marked appropriately.  Surgical consent had been signed. Preoperative IV antibiotic prophylaxis was given. She was transferred to the  operating room and placed in supine position with the Right upper extremity on an arm board.  General anesthesia was induced by the anesthesiologist. A regional block had been performed by anesthesia in preoperative holding.    Right upper extremity was prepped and draped in normal sterile orthopedic fashion.  A surgical pause was performed between the surgeons, anesthesia, and operating room staff and all were in agreement as to the patient, procedure, and site of procedure.  Tourniquet was placed and padded appropriately to the right upper arm.  Care was taken to avoid the prior AV fistula region in the upper arm however this is not an active fistula site.  The arm was exsanguinated and the tourniquet was inflated to 2050 mmHg.  Longitudinal incision was designed over the volar radial aspect of the wrist, stemming from just proximal to the wrist crease into the forearm.  15 blade was utilized for skin, blunt dissection was performed down, all neurovascular structures were protected in their entirety throughout.  Radial artery was identified and protected.  Standard FCR approach was used.  Fibers over the FCR tendon were sectioned longitudinally, tendon was retracted ulnarly and FCR sheath was incised.  Blunt dissection performed, FPL also retracted ulnarly, after which pronator quadratus was elevated sharply from distal radius at the distal and radial borders in order to expose the underlying fracture site.   There was noted to be significant comminution at the fracture site.  There was also a large metaphyseal ulnar spike that required initial reduction.  0.062 K wire was utilized to maintain the metaphyseal reduction and length initially.  Gentle reduction maneuver was performed of the intra-articular fragments under biplanar fluoroscopy.  Once we were satisfied with our reduction, standard 4-hole [employer] plate of appropriate laterality was  selected and temporarily affixed to bone utilizing a  combination of clamp and K wires.  Biplanar fluoroscopy was once again utilized to confirm appropriate plate placement and maintenance of fracture reduction.  Shaft screw was then placed to secure the plate proximally.  Nonlocking screw was utilized distally initially to capture the dorsal comminution and restore volar tilt.  Volar tilt was noted to be restored appropriately, joint line was well reduced.  Remaining distal holes were filled utilizing locking screws taking care to ensure all screws remained extra-articular and beneath the joint line.  Under biplanar fluoroscopy, we also ensured appropriate reduction of the radial styloid comminution and placement of the radial styloid screw.  As mentioned, there was significant comminution and multiple intra-articular fragments, we were able to capture these with the distal screws appropriately.  Nonlocking screw distally was then swapped for a locking screw of shorter variety to once again ensure screw remained well within bone.  Remaining shaft screws were all filled using combination of locking and nonlocking screws in bicortical fashion.  K wire holding the metaphyseal length was then removed, biplanar fluoroscopy was utilized to confirm that the fracture reduction was well-maintained after wire removal.  Distal radioulnar joint was then stressed and noted to be stable in all planes.  Final fluoroscopic views were taken utilizing biplanar fluoroscopy which confirmed appropriate plate placement, all fracture fragments had been secured appropriately and joint line had been restored as well.  Gentle flexion and extension of the wrist under fluoroscopy indicated stable appearance of the wrist with appropriately secured fracture fragments.  The tourniquet was deflated at 83 minutes.  Fingertips were pink with brisk capillary refill after deflation of tourniquet.  Copious irrigation was performed.  Layered closure was performed utilizing a combination of 3-0  Monocryl for subcutaneous layer and 4-0 nylon for the skin surface.  Sterile dressings were applied followed by application of a short arm volar and dorsal slab splint utilizing plaster.  The operative drapes were broken down.  Sling was applied.  The patient was awoken from anesthesia safely and taken to PACU in stable condition.  I will see her back in the office in 2 weeks for postoperative followup.    Post-operative plan: The patient will recover in the post-anesthesia care unit and then be discharged home.  The patient will be non weight bearing on the right upper extremity in a short arm splint.   I will see the patient back in the office in 2 weeks for postoperative followup.    A surgical assistant was utilized throughout this procedure.  Their presence was helpful in protecting critical neurovascular structures and during the critical portions of the procedure.  The assistant was also critical in minimizing operative time and patient morbidity.   Isatou Agredano, MD Electronically signed, 11/27/23

## 2023-11-28 ENCOUNTER — Encounter (HOSPITAL_BASED_OUTPATIENT_CLINIC_OR_DEPARTMENT_OTHER): Payer: Self-pay | Admitting: Orthopedic Surgery

## 2023-12-04 ENCOUNTER — Telehealth: Payer: Self-pay | Admitting: Pharmacist

## 2023-12-04 NOTE — Progress Notes (Addendum)
   12/04/2023  Patient ID: Kristen English, female   DOB: 03-21-1955, 68 y.o.   MRN: [id number]  Patient was identified for DM counseling based on A1c greater than 8% on last lab results.   Tried calling patient today to schedule free DM initial visit me as the pharmacist, with all changes made in collaboration with the PCP. Left HIPAA compliant voicemail requesting call back at earliest convenience to schedule. If patient returns call to office, you can transfer them to my line at [phone]. Thank you!  Attempt #1 (out of 3)  Patient's insulin dose was increased by Dr. Ransom on 9/3: increase NovoLog with meals to 12 units twice a day, and increase Lantus to 35 units   Also taking Ozempic 2mg  and Freestyle Libre. Appears she was started on Amlodipine  as well for HTN. Called to check-in on the status of these changes.   Called and spoke with the patient on the phone today. Reports she fell on 9/5- left ED with diagnosis of closed fracture right wrist, closed fracture of sacrum. During call today, she reports her legs hurt. Will discuss pain medication with the orthosurgeon on Friday. Doing OT as well lately.   Reports skipping insulin for a week. Stayed with her daughter out-of-town for a little while after her fall for assistance while recovering. Got back at 6AM today to her own house and has someone staying to help out if needed. Resting a lot lately. Was taking her other medications during that timeframe, but not the insulin. Was out of Novolog for about 2 months prior. BG reading of 199 during call (shortly after eating with no insulin in her system). Advised suggestions are increase NovoLog with meals to 12 units twice a day and increase Lantus to 35 units nightly. Confirmed understanding and plans to restart the insulin at these doses tomorrow. Taking Ozempic and using Libre to track readings still.   Confirms taking amlodipine  daily and is aware of upcoming visit to recheck readings.     Aloysius Lewis, PharmD [facility]  Phone Number: [phone]

## 2023-12-10 NOTE — Therapy (Incomplete)
 OUTPATIENT OCCUPATIONAL THERAPY ORTHO EVALUATION  Patient Name: Kristen English MRN: [id number] DOB:[birth year], 68 y.o., female Today's Date: 12/10/2023  PCP: Ransom BEAL MD REFERRING PROVIDER:  Arlinda Buster, MD    END OF SESSION:   Past Medical History:  Diagnosis Date   Arthritis    Chronic kidney disease    protein in urine   Diabetes mellitus without complication (HCC)    Gout    Hypertension    Sleep disorder 06/29/2014   Past Surgical History:  Procedure Laterality Date   AV FISTULA PLACEMENT Right 10/07/2019   Procedure: ARTERIOVENOUS (AV) FISTULA CREATION RIGHT UPPER ARM;  Surgeon: Eliza Lonni RAMAN, MD;  Location: [facility] OR;  Service: Vascular;  Laterality: Right;   COLONOSCOPY WITH PROPOFOL  N/A 05/11/2014   Procedure: COLONOSCOPY WITH PROPOFOL ;  Surgeon: Gladis MARLA Louder, MD;  Location: [facility];  Service: Endoscopy;  Laterality: N/A;   FOOT SURGERY Left 2007   IR FLUORO GUIDE CV LINE RIGHT  10/03/2019   IR US  GUIDE VASC ACCESS RIGHT  10/03/2019   JOINT REPLACEMENT  2009   bilateral knees   OPEN REDUCTION INTERNAL FIXATION (ORIF) DISTAL RADIAL FRACTURE Right 11/27/2023   Procedure: OPEN REDUCTION INTERNAL FIXATION (ORIF) DISTAL RADIUS FRACTURE;  Surgeon: Arlinda Buster, MD;  Location: [facility];  Service: Orthopedics;  Laterality: Right;   TOTAL KNEE ARTHROPLASTY     bilateral   Patient Active Problem List   Diagnosis Date Noted   Closed fracture of right distal radius 11/27/2023   Chronic diastolic heart failure (HCC) 11/22/2023   Decreased estrogen level 11/22/2023   Diabetic peripheral neuropathy associated with type 2 diabetes mellitus (HCC) 11/22/2023   Drug-induced immunodeficiency (HCC) 11/22/2023   History of kidney transplant 11/22/2023   History of primary malignant neoplasm of kidney 11/22/2023   Hardening of the aorta (main artery of the heart) (HCC) 11/22/2023   Mixed hyperlipidemia 11/22/2023   Stage 3a chronic kidney  disease (HCC) 11/22/2023   Pure hypercholesterolemia 11/22/2023   Anemia 11/22/2023   Stage 4 chronic kidney disease (HCC) 11/22/2023   Stage 5 chronic kidney disease (HCC) 11/22/2023   Diabetic hypoglycemia (HCC) 11/22/2023   Hypomagnesemia 05/18/2021   Weight gain 05/18/2021   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   Encounter for monitoring tacrolimus  therapy 08/04/2020   Uses self-applied continuous glucose monitoring device 08/04/2020   Acquired hypothyroidism 06/08/2020   Hyperparathyroidism (HCC) 06/08/2020   Perinephric fluid collection of kidney transplant 06/08/2020   Uncontrolled type 2 diabetes mellitus with hyperglycemia (HCC) 06/08/2020   Encounter for aftercare following kidney transplant 05/12/2020   Immunosuppression (HCC) 05/12/2020   Other disorders of phosphorus metabolism 01/01/2020   Other bacterial infections of unspecified site 12/30/2019   Hypokalemia 12/05/2019   Hyperkalemia 11/05/2019   Encounter for immunization 10/18/2019   Underimmunization status 10/18/2019   Allergy, unspecified, sequela 10/10/2019   Anaphylactic shock, unspecified, sequela 10/10/2019   Anemia in chronic kidney disease 10/10/2019   Chest pain, unspecified 10/10/2019   Coagulation defect, unspecified (HCC) 10/10/2019   Complication of vascular dialysis catheter 10/10/2019   Dyspnea, unspecified 10/10/2019   End stage renal disease (HCC) 10/10/2019   Iron deficiency anemia, unspecified 10/10/2019   Moderate protein-calorie malnutrition (HCC) 10/10/2019   Pain, unspecified 10/10/2019   Primary generalized (osteo)arthritis 10/10/2019   Proteinuria, unspecified 10/10/2019   Pruritus, unspecified 10/10/2019   Secondary hyperparathyroidism of renal origin (HCC) 10/10/2019   Type 2 diabetes mellitus with diabetic chronic kidney disease (HCC) 10/10/2019   Acute respiratory failure with  hypercapnia (HCC) 10/08/2019   AKI (acute kidney injury) (HCC) 10/02/2019   Essential  hypertension 10/02/2019   Normocytic anemia 10/02/2019   Normal anion gap metabolic acidosis 10/02/2019   Hypoglycemia associated with type 2 diabetes mellitus (HCC) 09/27/2019   Neoplasm of uncertain behavior of right kidney 09/08/2019   Renal cell carcinoma, right (HCC) 09/08/2019   Chronic renal insufficiency, stage 4 (severe) (HCC) 09/05/2018   Gout 06/29/2014   Sleep disorder 06/29/2014   Type 2 diabetes mellitus with chronic kidney disease, with long-term current use of insulin (HCC) 06/29/2014   HTN (hypertension) 06/29/2014   Monoclonal gammopathy of undetermined significance (MGUS) 04/27/2014   Monoclonal (M) protein disease, 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    ONSET DATE: DOS 11/28/23  REFERRING DIAG: M25.531 (ICD-10-CM) - Pain in right wrist    THERAPY DIAG:  No diagnosis found.  Rationale for Evaluation and Treatment: Rehabilitation  SUBJECTIVE:   SUBJECTIVE STATEMENT: She is now ~2 weeks s/p Rt DRF, ORIF. She states ***.    PERTINENT HISTORY: Needs OT splint 2 weeks s/p Right radius ORIF (11/28/23)  PRECAUTIONS: {Therapy precautions:24002}  RED FLAGS: {PT Red Flags:29287}   WEIGHT BEARING RESTRICTIONS: {Yes ***/No:24003}  PAIN:  Are you having pain? Yes: NPRS scale: *** Pain location: *** Pain description: *** Aggravating factors: *** Relieving factors: ***  FALLS: Has patient fallen in last 6 months? {fallsyesno:27318}  LIVING ENVIRONMENT: Lives with: {[facility] lives with:25569::lives with their family} Lives in: {Lives in:25570} Stairs: {opstairs:27293} Has following equipment at home: {Assistive devices:23999}  PLOF: {PLOF:24004}  PATIENT GOALS: ***  NEXT MD VISIT: ***   OBJECTIVE: (All objective assessments below are from initial evaluation on: 12/11/23 unless otherwise specified.)   HAND DOMINANCE: Right ***  ADLs: Overall ADLs: States decreased ability to grab, hold household objects, pain and difficulty to open containers, perform FMS  tasks (manipulate fasteners on clothing), mild to moderate bathing problems as well. ***   FUNCTIONAL OUTCOME MEASURES: Eval: Patient Specific Functional Scale: *** (***, ***, ***)  (Higher Score  =  Better Ability for the Selected Tasks)      UPPER EXTREMITY ROM     Shoulder to Wrist AROM Rt eval  Shoulder flexion   Shoulder abduction   Shoulder extension   Shoulder internal rotation   Shoulder external rotation   Elbow flexion   Elbow extension   Forearm supination   Forearm pronation    Wrist flexion   Wrist extension   Wrist ulnar deviation   Wrist radial deviation   Functional dart thrower's motion (F-DTM) in ulnar flexion   F-DTM in radial extension    (Blank rows = not tested)   Hand AROM Rt eval  Full Fist Ability (or Gap to Distal Palmar Crease)   Thumb Opposition  (Kapandji Scale)    Thumb MCP (0-60)   Thumb IP (0-80)   Thumb Radial Abduction Span    Thumb Palmar Abduction Span    Index MCP (0-90)    Index PIP (0-100)    Index DIP (0-70)    Long MCP (0-90)    Long PIP (0-100)    Long DIP (0-70)    Ring MCP (0-90)    Ring PIP (0-100)    Ring DIP (0-70)    Little MCP (0-90)    Little PIP (0-100)    Little DIP (0-70)    (Blank rows = not tested)   UPPER EXTREMITY MMT:    Eval:  NT at eval due to recent and still healing injuries. Will  be tested when appropriate.   MMT Rt TBD  Shoulder flexion   Shoulder abduction   Shoulder adduction   Shoulder extension   Shoulder internal rotation   Shoulder external rotation   Middle trapezius   Lower trapezius   Elbow flexion   Elbow extension   Forearm supination   Forearm pronation   Wrist flexion   Wrist extension   Wrist ulnar deviation   Wrist radial deviation   (Blank rows = not tested)  HAND FUNCTION: Eval: Observed weakness in affected Rt hand.  Grip strength Right: *** lbs, Left: *** lbs   COORDINATION: Eval: Observed coordination impairments with affected Rt hand. 9 Hole Peg Test  Right: TBDsec (*** sec is WFL)   SENSATION: Eval:  Light touch intact today,   *** though diminished around sx area    EDEMA:   Eval: *** Mildly swollen in *** hand and wrist today, ***cm circumferentially around ***  COGNITION: Eval: Overall cognitive status: WFL for evaluation today ***  OBSERVATIONS:   Eval: ***  Right radius ORIF  TODAY'S TREATMENT:  Post-evaluation treatment:   Custom orthotic fabrication was indicated due to pt's *** and need for safe, functional positioning. OT fabricated custom *** orthosis for pt today to ***. It fit well with no areas of pressure, pt states a comfortable fit. Pt was educated on the wearing schedule (on at all times except for hygiene and exercises  /  on at all times  /  only at night  /  only with activity  /  ***x day for ***minutes), to avoid exposing it to sources of heat, to wipe clean as needed (do not wash, use harsh detergents), to call or come in ASAP if it is causing any irritation or is not achieving desired function. It will be checked/adjusted in upcoming sessions, as needed. Pt states understanding all directions.    For safety/self-care she was recommended to do no significant weightbearing pushing pulling or painful activities with the right arm now.  We discussed wound care, preventing infection, scar and skin mobilizations as well.  She was given the following home exercises and educated upon them to do without pain, known forcefully.  She performs them back for understanding and states no significant pain or questions at the end of the session.  ***  PATIENT EDUCATION: Education details: See tx section above for details  Person educated: Patient Education method: Verbal Instruction, Teach back, Handouts  Education comprehension: States and demonstrates understanding, Additional Education required    HOME EXERCISE PROGRAM: See tx section above for details    GOALS: Goals reviewed with patient? Yes   SHORT TERM  GOALS: (STG required if POC>30 days) Target Date: ***  Pt will obtain protective, custom orthotic. Goal status: TBD/PRN,  MET ***  2.  Pt will demo/state understanding of initial HEP to improve pain levels and prerequisite motion. Goal status: INITIAL   LONG TERM GOALS: Target Date: ***  Pt will improve functional ability by decreased impairment per PSFS assessment from *** to *** or better, for better quality of life. Goal status: INITIAL  2.  Pt will improve grip strength in *** hand from ***lbs to at least ***lbs for functional use at home and in IADLs. Goal status: INITIAL  3.  Pt will improve A/ROM in *** from *** to at least ***, to have functional motion for tasks like reach and grasp.  Goal status: INITIAL  4.  Pt will improve strength in *** from *** MMT  to at least *** MMT to have increased functional ability to carry out selfcare and higher-level homecare tasks with less difficulty. Goal status: INITIAL  5.  Pt will improve coordination skills in ***, as seen by within functional limit score on *** testing to have increased functional ability to carry out fine motor tasks (fasteners, etc.) and more complex, coordinated IADLs (meal prep, sports, etc.).  Goal status: INITIAL  6.  Pt will decrease pain at worst from ***/10 to ***/10 or better to have better sleep and occupational participation in daily roles. Goal status: INITIAL   ASSESSMENT:  CLINICAL IMPRESSION: Patient is a 68 y.o. female who was seen today for occupational therapy evaluation for stiffness, swelling, pain, decreased coordination and functional activity with the right hand and arm after breaking her distal radius and subsequent ORIF surgery.  The patient will benefit from outpatient occupational therapy to decrease symptoms, improve functional upper extremity use, and increase quality of life.  PERFORMANCE DEFICITS: in functional skills including ADLs, IADLs, coordination, dexterity, edema, ROM,  strength, pain, fascial restrictions, Fine motor control, Gross motor control, body mechanics, endurance, decreased knowledge of precautions, wound, and UE functional use, cognitive skills including problem solving and safety awareness, and psychosocial skills including coping strategies, environmental adaptation, habits, and routines and behaviors.   IMPAIRMENTS: are limiting patient from ADLs, IADLs, rest and sleep, and leisure.   COMORBIDITIES: may have co-morbidities  that affects occupational performance. Patient will benefit from skilled OT to address above impairments and improve overall function.  MODIFICATION OR ASSISTANCE TO COMPLETE EVALUATION: No modification of tasks or assist necessary to complete an evaluation.  OT OCCUPATIONAL PROFILE AND HISTORY: Problem focused assessment: Including review of records relating to presenting problem.  CLINICAL DECISION MAKING: Moderate - several treatment options, min-mod task modification necessary  REHAB POTENTIAL: Excellent  EVALUATION COMPLEXITY: Low      PLAN:  OT FREQUENCY: 1-2x/week  OT DURATION: 8 weeks through 02/08/24 and up to 14 total visits as needed   PLANNED INTERVENTIONS: 97535 self care/ADL training, 02889 therapeutic exercise, 97530 therapeutic activity, 97112 neuromuscular re-education, 97140 manual therapy, 97035 ultrasound, 97032 electrical stimulation (manual), 97760 Orthotic Initial, [id number] Orthotic/Prosthetic subsequent, compression bandaging, Dry needling, energy conservation, coping strategies training, and patient/family education  RECOMMENDED OTHER SERVICES: none now    CONSULTED AND AGREED WITH PLAN OF CARE: Patient  PLAN FOR NEXT SESSION:   Review initial HEP and recommendations, check orthosis, upgrade to light PROM when tolerated, return to light FMS/coordination   Melvenia Ada, OTR/L, CHT  12/10/2023, 10:17 AM

## 2023-12-11 ENCOUNTER — Encounter: Payer: Self-pay | Admitting: Rehabilitative and Restorative Service Providers"

## 2023-12-11 ENCOUNTER — Ambulatory Visit (INDEPENDENT_AMBULATORY_CARE_PROVIDER_SITE_OTHER): Payer: Self-pay

## 2023-12-11 ENCOUNTER — Encounter: Admitting: Rehabilitative and Restorative Service Providers"

## 2023-12-11 ENCOUNTER — Ambulatory Visit (INDEPENDENT_AMBULATORY_CARE_PROVIDER_SITE_OTHER): Admitting: Orthopedic Surgery

## 2023-12-11 ENCOUNTER — Ambulatory Visit: Admitting: Rehabilitative and Restorative Service Providers"

## 2023-12-11 DIAGNOSIS — R600 Localized edema: Secondary | ICD-10-CM | POA: Diagnosis not present

## 2023-12-11 DIAGNOSIS — M6281 Muscle weakness (generalized): Secondary | ICD-10-CM

## 2023-12-11 DIAGNOSIS — Z9889 Other specified postprocedural states: Secondary | ICD-10-CM

## 2023-12-11 DIAGNOSIS — M25631 Stiffness of right wrist, not elsewhere classified: Secondary | ICD-10-CM | POA: Diagnosis not present

## 2023-12-11 DIAGNOSIS — S52571A Other intraarticular fracture of lower end of right radius, initial encounter for closed fracture: Secondary | ICD-10-CM | POA: Diagnosis not present

## 2023-12-11 DIAGNOSIS — M25531 Pain in right wrist: Secondary | ICD-10-CM | POA: Diagnosis not present

## 2023-12-11 DIAGNOSIS — Z8781 Personal history of (healed) traumatic fracture: Secondary | ICD-10-CM

## 2023-12-11 DIAGNOSIS — R278 Other lack of coordination: Secondary | ICD-10-CM | POA: Diagnosis not present

## 2023-12-11 NOTE — Therapy (Signed)
 OUTPATIENT OCCUPATIONAL THERAPY ORTHO EVALUATION  Patient Name: Kristen English MRN: [id number] DOB:01-28-1956, 68 y.o., female Today's Date: 12/11/2023  PCP: Ransom BEAL MD REFERRING PROVIDER:  Arlinda Buster, MD    END OF SESSION:  OT End of Session - 12/11/23 1100     Visit Number 1    Number of Visits 12    Date for Recertification  02/08/24    Authorization Type [employer]    OT Start Time 1100    OT Stop Time 1147    OT Time Calculation (min) 47 min    Equipment Utilized During Treatment Orthotic materials    Activity Tolerance Patient tolerated treatment well;No increased pain;Patient limited by fatigue;Patient limited by pain    Behavior During Therapy [facility] for tasks assessed/performed          Past Medical History:  Diagnosis Date   Arthritis    Chronic kidney disease    protein in urine   Diabetes mellitus without complication (HCC)    Gout    Hypertension    Sleep disorder 06/29/2014   Past Surgical History:  Procedure Laterality Date   AV FISTULA PLACEMENT Right 10/07/2019   Procedure: ARTERIOVENOUS (AV) FISTULA CREATION RIGHT UPPER ARM;  Surgeon: Eliza Lonni RAMAN, MD;  Location: [facility] OR;  Service: Vascular;  Laterality: Right;   COLONOSCOPY WITH PROPOFOL  N/A 05/11/2014   Procedure: COLONOSCOPY WITH PROPOFOL ;  Surgeon: Gladis MARLA Louder, MD;  Location: [facility];  Service: Endoscopy;  Laterality: N/A;   FOOT SURGERY Left 2007   IR FLUORO GUIDE CV LINE RIGHT  10/03/2019   IR US  GUIDE VASC ACCESS RIGHT  10/03/2019   JOINT REPLACEMENT  2009   bilateral knees   OPEN REDUCTION INTERNAL FIXATION (ORIF) DISTAL RADIAL FRACTURE Right 11/27/2023   Procedure: OPEN REDUCTION INTERNAL FIXATION (ORIF) DISTAL RADIUS FRACTURE;  Surgeon: Arlinda Buster, MD;  Location: [facility];  Service: Orthopedics;  Laterality: Right;   TOTAL KNEE ARTHROPLASTY     bilateral   Patient Active Problem List   Diagnosis Date Noted   Closed fracture of right  distal radius 11/27/2023   Chronic diastolic heart failure (HCC) 11/22/2023   Decreased estrogen level 11/22/2023   Diabetic peripheral neuropathy associated with type 2 diabetes mellitus (HCC) 11/22/2023   Drug-induced immunodeficiency 11/22/2023   History of kidney transplant 11/22/2023   History of primary malignant neoplasm of kidney 11/22/2023   Hardening of the aorta (main artery of the heart) 11/22/2023   Mixed hyperlipidemia 11/22/2023   Stage 3a chronic kidney disease (HCC) 11/22/2023   Pure hypercholesterolemia 11/22/2023   Anemia 11/22/2023   Stage 4 chronic kidney disease (HCC) 11/22/2023   Stage 5 chronic kidney disease (HCC) 11/22/2023   Diabetic hypoglycemia (HCC) 11/22/2023   Hypomagnesemia 05/18/2021   Weight gain 05/18/2021   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   Encounter for monitoring tacrolimus  therapy 08/04/2020   Uses self-applied continuous glucose monitoring device 08/04/2020   Acquired hypothyroidism 06/08/2020   Hyperparathyroidism 06/08/2020   Perinephric fluid collection of kidney transplant 06/08/2020   Uncontrolled type 2 diabetes mellitus with hyperglycemia (HCC) 06/08/2020   Encounter for aftercare following kidney transplant 05/12/2020   Immunosuppression 05/12/2020   Other disorders of phosphorus metabolism 01/01/2020   Other bacterial infections of unspecified site 12/30/2019   Hypokalemia 12/05/2019   Hyperkalemia 11/05/2019   Encounter for immunization 10/18/2019   Underimmunization status 10/18/2019   Allergy, unspecified, sequela 10/10/2019   Anaphylactic shock, unspecified, sequela 10/10/2019   Anemia in chronic kidney  disease 10/10/2019   Chest pain, unspecified 10/10/2019   Coagulation defect, unspecified 10/10/2019   Complication of vascular dialysis catheter 10/10/2019   Dyspnea, unspecified 10/10/2019   End stage renal disease (HCC) 10/10/2019   Iron deficiency anemia, unspecified 10/10/2019   Moderate protein-calorie  malnutrition 10/10/2019   Pain, unspecified 10/10/2019   Primary generalized (osteo)arthritis 10/10/2019   Proteinuria, unspecified 10/10/2019   Pruritus, unspecified 10/10/2019   Secondary hyperparathyroidism of renal origin 10/10/2019   Type 2 diabetes mellitus with diabetic chronic kidney disease (HCC) 10/10/2019   Acute respiratory failure with hypercapnia (HCC) 10/08/2019   AKI (acute kidney injury) 10/02/2019   Essential hypertension 10/02/2019   Normocytic anemia 10/02/2019   Normal anion gap metabolic acidosis 10/02/2019   Hypoglycemia associated with type 2 diabetes mellitus (HCC) 09/27/2019   Neoplasm of uncertain behavior of right kidney 09/08/2019   Renal cell carcinoma, right (HCC) 09/08/2019   Chronic renal insufficiency, stage 4 (severe) 09/05/2018   Gout 06/29/2014   Sleep disorder 06/29/2014   Type 2 diabetes mellitus with chronic kidney disease, with long-term current use of insulin (HCC) 06/29/2014   HTN (hypertension) 06/29/2014   Monoclonal gammopathy of undetermined significance (MGUS) 04/27/2014   Monoclonal (M) protein disease, 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    ONSET DATE: DOS 11/28/23  REFERRING DIAG: M25.531 (ICD-10-CM) - Pain in right wrist    THERAPY DIAG:  Pain in right wrist - Plan: Ot plan of care cert/re-cert  Localized edema - Plan: Ot plan of care cert/re-cert  Muscle weakness (generalized) - Plan: Ot plan of care cert/re-cert  Stiffness of right wrist, not elsewhere classified - Plan: Ot plan of care cert/re-cert  Other lack of coordination - Plan: Ot plan of care cert/re-cert  Rationale for Evaluation and Treatment: Rehabilitation  SUBJECTIVE:   SUBJECTIVE STATEMENT: She is now ~2 weeks s/p Rt DRF, ORIF. She states her hip gave out and she fell.  She will be meeting with physical therapy later this week to address her hip.  As a result of her fall, she broke her right wrist and underwent ORIF surgery.  She is now having pain,  hypersensitivity, weakness and is very anxious.    PERTINENT HISTORY: Needs OT splint 2 weeks s/p Right radius ORIF (11/28/23)  She did break her Rt wrist approx 30 years ago and it had some limited mobility  PRECAUTIONS: None  RED FLAGS: None   WEIGHT BEARING RESTRICTIONS: Yes nonweightbearing in right wrist and hand  PAIN:  Are you having pain? Yes: NPRS scale: 0/10 at rest, up to 10/10 at worst in past week, momentarily  Pain location: Right wrist Pain description: Aching Aggravating factors: Movement or attempted weightbearing Relieving factors: Rest  FALLS: Has patient fallen in last 6 months? Yes. Number of falls 1 (this accident, mild risk of falls but will be working with PT this week)   PLOF: Independent  PATIENT GOALS: To improve right hand and arm to return to full functional ability  NEXT MD VISIT: As needed    OBJECTIVE: (All objective assessments below are from initial evaluation on: 12/11/23 unless otherwise specified.)   HAND DOMINANCE: Right   ADLs: Overall ADLs: States decreased ability to grab, hold household objects, pain and difficulty to open containers, perform FMS tasks (manipulate fasteners on clothing), mild to moderate bathing problems as well.    FUNCTIONAL OUTCOME MEASURES: Eval: Patient Specific Functional Scale: 1.75 (drive, cook, wash, make bed)  (Higher Score  =  Better Ability for the Selected Tasks)  UPPER EXTREMITY ROM     Shoulder to Wrist AROM Rt eval  Shoulder flexion   Shoulder abduction   Shoulder extension   Shoulder internal rotation   Shoulder external rotation   Elbow flexion 145  Elbow extension (-25)  Forearm supination 35  Forearm pronation  15  Wrist flexion 15  Wrist extension 30  Wrist ulnar deviation   Wrist radial deviation   Functional dart thrower's motion (F-DTM) in ulnar flexion   F-DTM in radial extension    (Blank rows = not tested)   Hand AROM Rt eval  Full Fist Ability (or Gap to Distal  Palmar Crease) 4cm gap  from tip of MF to [employer]  Thumb Opposition  (Kapandji Scale)  3/10  Thumb MCP (0-60)   Thumb IP (0-80)   Thumb Radial Abduction Span    Thumb Palmar Abduction Span    Index MCP (0-90)    Index PIP (0-100)    Index DIP (0-70)    Long MCP (0-90)    Long PIP (0-100)    Long DIP (0-70)    Ring MCP (0-90)    Ring PIP (0-100)    Ring DIP (0-70)    Little MCP (0-90)    Little PIP (0-100)    Little DIP (0-70)    (Blank rows = not tested)   UPPER EXTREMITY MMT:    Eval:  NT at eval due to recent and still healing injuries. Will be tested when appropriate.   MMT Rt TBD  Elbow flexion   Elbow extension   Forearm supination   Forearm pronation   Wrist flexion   Wrist extension   Wrist ulnar deviation   Wrist radial deviation   (Blank rows = not tested)  HAND FUNCTION: Eval: Observed weakness in affected Rt hand. Details TBD when safe  Grip strength Right: TBD lbs, Left: TBD lbs   COORDINATION: Eval: Observed coordination impairments with affected Rt hand.  TBD in next 1-2 sessions as able 9 Hole Peg Test Right: TBDsec (TD sec is WFL)   SENSATION: Eval:  Light touch intact today,   though diminished, also somewhat hypersensitive around sx area    EDEMA:   Eval: Moderate swollen in Rt hand and wrist today, 17.5cm circumferentially around right distal wrist crease (15.2cm in Lt wrist)  COGNITION: Eval: Overall cognitive status: WFL for evaluation today   OBSERVATIONS:   Eval: A bit hypersensitive, overtly stiff, a bit anxious.  Otherwise surgical area looks excellent.   Right radius ORIF  TODAY'S TREATMENT:  Post-evaluation treatment:   Custom orthotic fabrication was indicated due to pt's healing right wrist fracture and need for safe, functional positioning. OT fabricated custom wrist immobilization orthosis for pt today to protect the surgery site. It fit well with no areas of pressure, pt states a comfortable fit. Pt was educated on the wearing  schedule (on at all times except for hygiene and exercises), to avoid exposing it to sources of heat, to wipe clean as needed (do not wash, use harsh detergents), to call or come in ASAP if it is causing any irritation or is not achieving desired function. It will be checked/adjusted in upcoming sessions, as needed. Pt states understanding all directions.    For safety/self-care she was recommended to do no significant weightbearing pushing pulling or painful activities with the right arm now.  We discussed wound care, preventing infection, scar and skin mobilizations as well.  She was given the following home exercises and educated upon them  to do without pain, known forcefully.  She performs them back for understanding and states no significant pain or questions at the end of the session.  Exercises - Standing Elbow Flexion Extension AROM  - 4-6 x daily - 10-15 reps - Turn [employer] Facing Up & Down  - 4-6 x daily - 10-15 reps - Bend and Pull Back Wrist SLOWLY  - 4 x daily - 10-15 reps - Wrist AROM Dart Throwers Motion  - 4 x daily - 10-15 reps - Thumb Opposition  - 4-6 x daily - 10 reps - Tendon Glides  - 4-6 x daily - 3-5 reps - 2-3 seconds hold    PATIENT EDUCATION: Education details: See tx section above for details  Person educated: Patient Education method: Engineer, structural, Teach back, Handouts  Education comprehension: States and demonstrates understanding, Additional Education required    HOME EXERCISE PROGRAM: Access Code: [email or web address] URL: [email or web address] Date: 12/11/2023 Prepared by: Melvenia Ada     GOALS: Goals reviewed with patient? Yes   SHORT TERM GOALS: (STG required if POC>30 days) Target Date: 12/28/23  Pt will obtain protective, custom orthotic. Goal status: 12/11/23  MET   2.  Pt will demo/state understanding of initial HEP to improve pain levels and prerequisite motion. Goal status: INITIAL   LONG TERM GOALS: Target Date:  02/08/24  Pt will improve functional ability by decreased impairment per PSFS assessment from 1.75 to 6 or better, for better quality of life. Goal status: INITIAL  2.  Pt will improve grip strength in Rt hand from unsafe to test to at least 20 lbs for functional use at home and in IADLs. Goal status: INITIAL  3.  Pt will improve A/ROM in right wrist flexion/extension from 15/30 degrees respectively to at least 55 degrees each, to have functional motion for tasks like reach and grasp.  Goal status: INITIAL  4.  Pt will improve strength in right wrist flexion/extension from apparent 3 -/5 MMT to at least 4+/5 MMT to have increased functional ability to carry out selfcare and higher-level homecare tasks with less difficulty. Goal status: INITIAL  5.  Pt will improve coordination skills in right hand and arm, as seen by within functional limit score on nine-hole peg testing to have increased functional ability to carry out fine motor tasks (fasteners, etc.) and more complex, coordinated IADLs (meal prep, sports, etc.).  Goal status: INITIAL  6.  Pt will decrease pain at worst from 10/10 to 3/10 or better to have better sleep and occupational participation in daily roles. Goal status: INITIAL   ASSESSMENT:  CLINICAL IMPRESSION: Patient is a 68 y.o. female who was seen today for occupational therapy evaluation for stiffness, swelling, pain, decreased coordination and functional activity with the right hand and arm after breaking her distal radius and subsequent ORIF surgery.  The patient will benefit from outpatient occupational therapy to decrease symptoms, improve functional upper extremity use, and increase quality of life.  PERFORMANCE DEFICITS: in functional skills including ADLs, IADLs, coordination, dexterity, edema, ROM, strength, pain, fascial restrictions, Fine motor control, Gross motor control, body mechanics, endurance, decreased knowledge of precautions, wound, and UE functional  use, cognitive skills including problem solving and safety awareness, and psychosocial skills including coping strategies, environmental adaptation, habits, and routines and behaviors.   IMPAIRMENTS: are limiting patient from ADLs, IADLs, rest and sleep, and leisure.   COMORBIDITIES: may have co-morbidities  that affects occupational performance. Patient will benefit from skilled OT to address above impairments and improve overall function.  MODIFICATION OR ASSISTANCE TO COMPLETE EVALUATION: No modification of tasks or assist necessary to complete an evaluation.  OT OCCUPATIONAL PROFILE AND HISTORY: Problem focused assessment: Including review of records relating to presenting problem.  CLINICAL DECISION MAKING: Moderate - several treatment options, min-mod task modification necessary  REHAB POTENTIAL: Excellent  EVALUATION COMPLEXITY: Low      PLAN:  OT FREQUENCY: 1-2x/week  OT DURATION: 8 weeks through 02/08/24 and up to 12 total visits as needed   PLANNED INTERVENTIONS: 97535 self care/ADL training, 02889 therapeutic exercise, 97530 therapeutic activity, 97112 neuromuscular re-education, 97140 manual therapy, 97035 ultrasound, 97032 electrical stimulation (manual), 97760 Orthotic Initial, [id number] Orthotic/Prosthetic subsequent, compression bandaging, Dry needling, energy conservation, coping strategies training, and patient/family education  RECOMMENDED OTHER SERVICES: none now    CONSULTED AND AGREED WITH PLAN OF CARE: Patient  PLAN FOR NEXT SESSION:   Review initial HEP and recommendations, check orthosis, upgrade to light PROM when tolerated, return to light FMS/coordination   Melvenia Ada, OTR/L, CHT  12/11/2023, 1:44 PM    Referring diagnosis? M25.531 (ICD-10-CM) - Pain in right wrist  Treatment diagnosis? (if different than referring diagnosis) M25.531  What was this (referring dx) caused by? [x]  Surgery [x]  Fall []  Ongoing issue []  Arthritis []  Other:  ____________  Laterality: [x]  Rt []  Lt []  Both  Check all possible CPT codes:  *CHOOSE 10 OR LESS*    See Planned Interventions listed in the Plan section of the Evaluation.

## 2023-12-11 NOTE — Progress Notes (Signed)
   Kristen English - 68 y.o. female MRN [id number]  Date of birth: 1955-06-25  Office Visit Note: Visit Date: 12/11/2023 PCP: Ransom Other, MD Referred by: Ransom Other, MD  Subjective:  HPI: Kristen English is a 68 y.o. female who presents today for follow up 2 weeks status post right distal radius fracture internal fixation, intra-articular 3 + segments and closed treatment of ulnar styloid fracture.  She is doing very well overall, pain is well-controlled.  Has been compliant with the splinting as instructed.  Pertinent ROS were reviewed with the patient and found to be negative unless otherwise specified above in HPI.   Assessment & Plan: Visit Diagnoses:  1. S/P ORIF (open reduction internal fixation) fracture   2. Other closed intra-articular fracture of distal end of right radius, initial encounter     Plan: X-rays obtained today show stable appearance of the distal radius fracture with appropriate hardware fixation.  I did explain to her the severity of her injury, intra-articular with multiple fragments that we will need ongoing protection of this injury to allow for ongoing healing.  Occupational therapy was here today for fabrication of a removable orthosis to begin gentle range of motion exercises of the wrist and hand.  She is instructed to refrain from weightbearing at this time.  Follow-up myself in 2 weeks for repeat clinical and radiographic check.  Follow-up: No follow-ups on file.   Meds & Orders: No orders of the defined types were placed in this encounter.   Orders Placed This Encounter  Procedures   XR Wrist Complete Right     Procedures: No procedures performed       Objective:   Vital Signs: There were no vitals taken for this visit.  Ortho Exam Right wrist: - Well-healing volar incision, skin edges well-approximated without erythema or drainage - Distal range of motion is well-preserved, AIN/PIN/interosseous intact, hand mains warm  well-perfused, sensation intact in median/radial/ulnar distributions - Gentle wrist range of motion flexion extension without significant pain or crepitus  Imaging: XR Wrist Complete Right Result Date: 12/11/2023 X-rays of the right wrist demonstrate previously known intra-articular distal radius fracture with appropriate hardware fixation.  No evidence of interval displacement or hardware failure.    Armani Gawlik Afton Alderton, M.D. [facility], Hand Surgery

## 2023-12-14 ENCOUNTER — Ambulatory Visit: Admitting: Physician Assistant

## 2023-12-14 ENCOUNTER — Encounter: Payer: Self-pay | Admitting: Physician Assistant

## 2023-12-14 DIAGNOSIS — G8929 Other chronic pain: Secondary | ICD-10-CM

## 2023-12-14 DIAGNOSIS — M5442 Lumbago with sciatica, left side: Secondary | ICD-10-CM | POA: Diagnosis not present

## 2023-12-14 NOTE — Progress Notes (Signed)
 Office Visit Note   Patient: Kristen English           Date of Birth: April 15, 1955           MRN: [id number] Visit Date: 12/14/2023              Requested by: Ransom Other, MD [address] [place],  [place] [address] PCP: Ransom Other, MD   Assessment & Plan: Visit Diagnoses:  1. Chronic bilateral low back pain with left-sided sciatica     Plan: [name] is a pleasant 68 year old woman who is status post fall approximately 4 weeks ago.  She sustained a right wrist fracture which was treated by Dr. Arlinda.  She comes in today complaining of posterior left buttock pain that radiates down to her knee.  She says this was happening before her fall.  She denies any paresthesias she denies any loss of bowel or bladder control.  She has had a kidney transplant which makes it difficult to discuss medication.  She cannot take Tylenol .  I encouraged her to take this on a regular basis.  No red flags today I will place her in physical therapy for her back even if she could get a home exercise program did not make it to burdensome for her have to go to both OT and PT.  Will send me a message in about a month if she is not better or if she worsens before that would consider an MRI with possible injections she does note have a sacral fracture from her fall of this is not very symptomatic  Follow-Up Instructions: Return if symptoms worsen or fail to improve.   Orders:  Orders Placed This Encounter  Procedures   Ambulatory referral to Physical Therapy   No orders of the defined types were placed in this encounter.     Procedures: No procedures performed   Clinical Data: No additional findings.   Subjective: Chief Complaint  Patient presents with   Lower Back - Pain    HPI pleasant 68 year old woman comes in today with left-sided posterior buttock pain radiating down her leg.  She did have a fall at the beginning of September and was presented to the emergency  room  Review of Systems  All other systems reviewed and are negative.    Objective: Vital Signs: There were no vitals taken for this visit.  Physical Exam Constitutional:      Appearance: Normal appearance.  Pulmonary:     Effort: Pulmonary effort is normal.  Skin:    General: Skin is warm and dry.  Neurological:     General: No focal deficit present.     Mental Status: She is alert and oriented to person, place, and time.  Psychiatric:        Mood and Affect: Mood normal.        Behavior: Behavior normal.     Ortho Exam Examination of her left hip she has no pain with range of motion no significant groin pain she has good flexor and extension strength.  Negative straight leg raise.  Compartments are soft and nontender she does have some tenderness in the lower back but no ecchymosis no step-offs.  No tenderness over the trochanteric bursa sensation is grossly intact cannot appreciate any significant weakness today Specialty Comments:  No specialty comments available.  Imaging: No results found.   PMFS History: Patient Active Problem List   Diagnosis Date Noted   Closed fracture of right distal radius  11/27/2023   Chronic diastolic heart failure (HCC) 11/22/2023   Decreased estrogen level 11/22/2023   Diabetic peripheral neuropathy associated with type 2 diabetes mellitus (HCC) 11/22/2023   Drug-induced immunodeficiency 11/22/2023   History of kidney transplant 11/22/2023   History of primary malignant neoplasm of kidney 11/22/2023   Hardening of the aorta (main artery of the heart) 11/22/2023   Mixed hyperlipidemia 11/22/2023   Stage 3a chronic kidney disease (HCC) 11/22/2023   Pure hypercholesterolemia 11/22/2023   Anemia 11/22/2023   Stage 4 chronic kidney disease (HCC) 11/22/2023   Stage 5 chronic kidney disease (HCC) 11/22/2023   Diabetic hypoglycemia (HCC) 11/22/2023   Hypomagnesemia 05/18/2021   Weight gain 05/18/2021   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   Encounter for monitoring tacrolimus  therapy 08/04/2020   Uses self-applied continuous glucose monitoring device 08/04/2020   Acquired hypothyroidism 06/08/2020   Hyperparathyroidism 06/08/2020   Perinephric fluid collection of kidney transplant 06/08/2020   Uncontrolled type 2 diabetes mellitus with hyperglycemia (HCC) 06/08/2020   Encounter for aftercare following kidney transplant 05/12/2020   Immunosuppression 05/12/2020   Other disorders of phosphorus metabolism 01/01/2020   Other bacterial infections of unspecified site 12/30/2019   Hypokalemia 12/05/2019   Hyperkalemia 11/05/2019   Encounter for immunization 10/18/2019   Underimmunization status 10/18/2019   Allergy, unspecified, sequela 10/10/2019   Anaphylactic shock, unspecified, sequela 10/10/2019   Anemia in chronic kidney disease 10/10/2019   Chest pain, unspecified 10/10/2019   Coagulation defect, unspecified 10/10/2019   Complication of vascular dialysis catheter 10/10/2019   Dyspnea, unspecified 10/10/2019   End stage renal disease (HCC) 10/10/2019   Iron deficiency anemia, unspecified 10/10/2019   Moderate protein-calorie malnutrition 10/10/2019   Pain, unspecified 10/10/2019   Primary generalized (osteo)arthritis 10/10/2019   Proteinuria, unspecified 10/10/2019   Pruritus, unspecified 10/10/2019   Secondary hyperparathyroidism of renal origin 10/10/2019   Type 2 diabetes mellitus with diabetic chronic kidney disease (HCC) 10/10/2019   Acute respiratory failure with hypercapnia (HCC) 10/08/2019   AKI (acute kidney injury) 10/02/2019   Essential hypertension 10/02/2019   Normocytic anemia 10/02/2019   Normal anion gap metabolic acidosis 10/02/2019   Hypoglycemia associated with type 2 diabetes mellitus (HCC) 09/27/2019   Neoplasm of uncertain behavior of right kidney 09/08/2019   Renal cell carcinoma, right (HCC) 09/08/2019   Chronic renal insufficiency, stage 4 (severe) 09/05/2018   Gout 06/29/2014    Sleep disorder 06/29/2014   Type 2 diabetes mellitus with chronic kidney disease, with long-term current use of insulin (HCC) 06/29/2014   HTN (hypertension) 06/29/2014   Monoclonal gammopathy of undetermined significance (MGUS) 04/27/2014   Monoclonal (M) protein disease, 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   Past Medical History:  Diagnosis Date   Arthritis    Chronic kidney disease    protein in urine   Diabetes mellitus without complication (HCC)    Gout    Hypertension    Sleep disorder 06/29/2014    Family History  Problem Relation Age of Onset   Heart attack Mother    Heart attack Father     Past Surgical History:  Procedure Laterality Date   AV FISTULA PLACEMENT Right 10/07/2019   Procedure: ARTERIOVENOUS (AV) FISTULA CREATION RIGHT UPPER ARM;  Surgeon: Eliza Lonni RAMAN, MD;  Location[facility] OR;  Service: Vascular;  Laterality: Right;   COLONOSCOPY WITH PROPOFOL  N/A 05/11/2014   Procedure: COLONOSCOPY WITH PROPOFOL ;  Surgeon: Gladis MARLA Louder, MD;  Location: [facility];  Service: Endoscopy;  Laterality: N/A;   FOOT SURGERY Left 2007  IR FLUORO GUIDE CV LINE RIGHT  10/03/2019   IR US  GUIDE VASC ACCESS RIGHT  10/03/2019   JOINT REPLACEMENT  2009   bilateral knees   OPEN REDUCTION INTERNAL FIXATION (ORIF) DISTAL RADIAL FRACTURE Right 11/27/2023   Procedure: OPEN REDUCTION INTERNAL FIXATION (ORIF) DISTAL RADIUS FRACTURE;  Surgeon: Arlinda Buster, MD;  Location: [facility];  Service: Orthopedics;  Laterality: Right;   TOTAL KNEE ARTHROPLASTY     bilateral   Social History   Occupational History   Not on file  Tobacco Use   Smoking status: Never   Smokeless tobacco: Never  Vaping Use   Vaping status: Never Used  Substance and Sexual Activity   Alcohol use: No   Drug use: No   Sexual activity: Not Currently

## 2023-12-18 DIAGNOSIS — N1831 Chronic kidney disease, stage 3a: Secondary | ICD-10-CM | POA: Diagnosis not present

## 2023-12-18 DIAGNOSIS — E039 Hypothyroidism, unspecified: Secondary | ICD-10-CM | POA: Diagnosis not present

## 2023-12-18 DIAGNOSIS — E1140 Type 2 diabetes mellitus with diabetic neuropathy, unspecified: Secondary | ICD-10-CM | POA: Diagnosis not present

## 2023-12-18 DIAGNOSIS — I5032 Chronic diastolic (congestive) heart failure: Secondary | ICD-10-CM | POA: Diagnosis not present

## 2023-12-18 DIAGNOSIS — E1122 Type 2 diabetes mellitus with diabetic chronic kidney disease: Secondary | ICD-10-CM | POA: Diagnosis not present

## 2023-12-18 DIAGNOSIS — E1165 Type 2 diabetes mellitus with hyperglycemia: Secondary | ICD-10-CM | POA: Diagnosis not present

## 2023-12-20 NOTE — Therapy (Signed)
 OUTPATIENT OCCUPATIONAL THERAPY TREATMENT NOTE  Patient Name: Kristen English MRN: [id number] DOB:1955/12/08, 68 y.o., female Today's Date: 12/24/2023  PCP: Ransom BEAL MD REFERRING PROVIDER:  Arlinda Buster, MD    END OF SESSION:  OT End of Session - 12/24/23 1152     Visit Number 2    Number of Visits 12    Date for Recertification  02/08/24    Authorization Type [employer]    OT Start Time 1152    OT Stop Time 1226    OT Time Calculation (min) 34 min    Equipment Utilized During Treatment Orthotic materials    Activity Tolerance Patient tolerated treatment well;No increased pain;Patient limited by fatigue;Patient limited by pain    Behavior During Therapy [facility] for tasks assessed/performed           Past Medical History:  Diagnosis Date   Arthritis    Chronic kidney disease    protein in urine   Diabetes mellitus without complication (HCC)    Gout    Hypertension    Sleep disorder 06/29/2014   Past Surgical History:  Procedure Laterality Date   AV FISTULA PLACEMENT Right 10/07/2019   Procedure: ARTERIOVENOUS (AV) FISTULA CREATION RIGHT UPPER ARM;  Surgeon: Eliza Lonni RAMAN, MD;  Location: [facility] OR;  Service: Vascular;  Laterality: Right;   COLONOSCOPY WITH PROPOFOL  N/A 05/11/2014   Procedure: COLONOSCOPY WITH PROPOFOL ;  Surgeon: Gladis MARLA Louder, MD;  Location: [facility];  Service: Endoscopy;  Laterality: N/A;   FOOT SURGERY Left 2007   IR FLUORO GUIDE CV LINE RIGHT  10/03/2019   IR US  GUIDE VASC ACCESS RIGHT  10/03/2019   JOINT REPLACEMENT  2009   bilateral knees   OPEN REDUCTION INTERNAL FIXATION (ORIF) DISTAL RADIAL FRACTURE Right 11/27/2023   Procedure: OPEN REDUCTION INTERNAL FIXATION (ORIF) DISTAL RADIUS FRACTURE;  Surgeon: Arlinda Buster, MD;  Location: [facility];  Service: Orthopedics;  Laterality: Right;   TOTAL KNEE ARTHROPLASTY     bilateral   Patient Active Problem List   Diagnosis Date Noted   Closed fracture of right  distal radius 11/27/2023   Chronic diastolic heart failure (HCC) 11/22/2023   Decreased estrogen level 11/22/2023   Diabetic peripheral neuropathy associated with type 2 diabetes mellitus (HCC) 11/22/2023   Drug-induced immunodeficiency 11/22/2023   History of kidney transplant 11/22/2023   History of primary malignant neoplasm of kidney 11/22/2023   Hardening of the aorta (main artery of the heart) 11/22/2023   Mixed hyperlipidemia 11/22/2023   Stage 3a chronic kidney disease (HCC) 11/22/2023   Pure hypercholesterolemia 11/22/2023   Anemia 11/22/2023   Stage 4 chronic kidney disease (HCC) 11/22/2023   Stage 5 chronic kidney disease (HCC) 11/22/2023   Diabetic hypoglycemia (HCC) 11/22/2023   Hypomagnesemia 05/18/2021   Weight gain 05/18/2021   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   Encounter for monitoring tacrolimus  therapy 08/04/2020   Uses self-applied continuous glucose monitoring device 08/04/2020   Acquired hypothyroidism 06/08/2020   Hyperparathyroidism 06/08/2020   Perinephric fluid collection of kidney transplant 06/08/2020   Uncontrolled type 2 diabetes mellitus with hyperglycemia (HCC) 06/08/2020   Encounter for aftercare following kidney transplant 05/12/2020   Immunosuppression 05/12/2020   Other disorders of phosphorus metabolism 01/01/2020   Other bacterial infections of unspecified site 12/30/2019   Hypokalemia 12/05/2019   Hyperkalemia 11/05/2019   Encounter for immunization 10/18/2019   Underimmunization status 10/18/2019   Allergy, unspecified, sequela 10/10/2019   Anaphylactic shock, unspecified, sequela 10/10/2019   Anemia in chronic  kidney disease 10/10/2019   Chest pain, unspecified 10/10/2019   Coagulation defect, unspecified 10/10/2019   Complication of vascular dialysis catheter 10/10/2019   Dyspnea, unspecified 10/10/2019   End stage renal disease (HCC) 10/10/2019   Iron deficiency anemia, unspecified 10/10/2019   Moderate protein-calorie  malnutrition 10/10/2019   Pain, unspecified 10/10/2019   Primary generalized (osteo)arthritis 10/10/2019   Proteinuria, unspecified 10/10/2019   Pruritus, unspecified 10/10/2019   Secondary hyperparathyroidism of renal origin 10/10/2019   Type 2 diabetes mellitus with diabetic chronic kidney disease (HCC) 10/10/2019   Acute respiratory failure with hypercapnia (HCC) 10/08/2019   AKI (acute kidney injury) 10/02/2019   Essential hypertension 10/02/2019   Normocytic anemia 10/02/2019   Normal anion gap metabolic acidosis 10/02/2019   Hypoglycemia associated with type 2 diabetes mellitus (HCC) 09/27/2019   Neoplasm of uncertain behavior of right kidney 09/08/2019   Renal cell carcinoma, right (HCC) 09/08/2019   Chronic renal insufficiency, stage 4 (severe) 09/05/2018   Gout 06/29/2014   Sleep disorder 06/29/2014   Type 2 diabetes mellitus with chronic kidney disease, with long-term current use of insulin (HCC) 06/29/2014   HTN (hypertension) 06/29/2014   Monoclonal gammopathy of undetermined significance (MGUS) 04/27/2014   Monoclonal (M) protein disease, 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    ONSET DATE: DOS 11/28/23  REFERRING DIAG: M25.531 (ICD-10-CM) - Pain in right wrist    THERAPY DIAG:  Localized edema  Muscle weakness (generalized)  Pain in right wrist  Stiffness of right wrist, not elsewhere classified  Other lack of coordination  Rationale for Evaluation and Treatment: Rehabilitation  PERTINENT HISTORY: Needs OT splint 2 weeks s/p Right radius ORIF (11/28/23)  She did break her Rt wrist approx 30 years ago and it had some limited mobility She states her hip gave out and she fell.  She will be meeting with physical therapy later this week to address her hip.  As a result of her fall, she broke her right wrist and underwent ORIF surgery.  She is now having pain, hypersensitivity, weakness and is very anxious.  PRECAUTIONS: None  RED FLAGS: None   WEIGHT BEARING  RESTRICTIONS: Yes nonweightbearing in right wrist and hand   SUBJECTIVE:   SUBJECTIVE STATEMENT: She is now ~4 weeks s/p Rt DRF, ORIF. She states having many doctors appointments next week and she may need to miss therapy.      PAIN:  Are you having pain? Yes: NPRS scale: ** 0/10 at rest, up to 10/10 at worst in past week, momentarily  Pain location: Right wrist Pain description: Aching Aggravating factors: Movement or attempted weightbearing Relieving factors: Rest    PATIENT GOALS: To improve right hand and arm to return to full functional ability  NEXT MD VISIT: As needed    OBJECTIVE: (All objective assessments below are from initial evaluation on: 12/11/23 unless otherwise specified.)   HAND DOMINANCE: Right   ADLs: Overall ADLs: States decreased ability to grab, hold household objects, pain and difficulty to open containers, perform FMS tasks (manipulate fasteners on clothing), mild to moderate bathing problems as well.    FUNCTIONAL OUTCOME MEASURES: Eval: Patient Specific Functional Scale: 1.75 (drive, cook, wash, make bed)  (Higher Score  =  Better Ability for the Selected Tasks)      UPPER EXTREMITY ROM     Shoulder to Wrist AROM Rt eval Rt 12/24/23  Shoulder flexion    Shoulder abduction    Shoulder extension    Shoulder internal rotation    Shoulder external rotation  Elbow flexion 145 150  Elbow extension (-25) Now (18* bilaterally)   Forearm supination 35 75  Forearm pronation  15 84  Wrist flexion 15 10  Wrist extension 30 22  Wrist ulnar deviation    Wrist radial deviation    Functional dart thrower's motion (F-DTM) in ulnar flexion    F-DTM in radial extension     (Blank rows = not tested)   Hand AROM Rt eval Rt 12/24/23  Full Fist Ability (or Gap to Distal Palmar Crease) 4cm gap  from tip of MF to [facility] Full fist  Thumb Opposition  (Kapandji Scale)  3/10 7/10  Thumb MCP (0-60)    Thumb IP (0-80)    Thumb Radial Abduction Span      Thumb Palmar Abduction Span     Index MCP (0-90)     Index PIP (0-100)     Index DIP (0-70)     Long MCP (0-90)     Long PIP (0-100)     Long DIP (0-70)     Ring MCP (0-90)     Ring PIP (0-100)     Ring DIP (0-70)     Little MCP (0-90)     Little PIP (0-100)     Little DIP (0-70)     (Blank rows = not tested)   UPPER EXTREMITY MMT:    Eval:  NT at eval due to recent and still healing injuries. Will be tested when appropriate.   MMT Rt TBD  Elbow flexion   Elbow extension   Forearm supination   Forearm pronation   Wrist flexion   Wrist extension   Wrist ulnar deviation   Wrist radial deviation   (Blank rows = not tested)  HAND FUNCTION: Eval: Observed weakness in affected Rt hand. Details TBD when safe  Grip strength Right: TBD lbs, Left: TBD lbs   COORDINATION: TBD: 9 Hole Peg Test Right: TBDsec (TBD sec is WFL)   SENSATION: Eval:  Light touch intact today,   though diminished, also somewhat hypersensitive around sx area    EDEMA:   Eval: Moderate swollen in Rt hand and wrist today, 17.5cm circumferentially around right distal wrist crease (15.2cm in Lt wrist)  OBSERVATIONS:   Eval: A bit hypersensitive, overtly stiff, a bit anxious.  Otherwise surgical area looks excellent.   Right radius ORIF  TODAY'S TREATMENT:  12/24/23: OT starts by adjusting her orthosis to make it more comfortable around the thumb and ulnar and radial styloids.  This involved redipping the thermoplastic and forming into her arm again.  Afterwards it fits much better she states.  We reviewed safety/self-care, and no significant weightbearing, but also now weaning from the orthosis for light functional tasks for 5 times a day for 10 to 15-minute periods for nothing weighing more than 5 pounds.   Active range of motion exercises yield excellent measurement results today.  The wrist is still a bit stiff, but she reminds OT that she has a chronic wrist injury and chronic stiffness.   OT  upgrades her home exercise program to include in hand manipulation skills as well as stretches at the wrist and thumb.  She tolerates these well, and was asked to do these 4 times a day at home.  She leaves having no questions and no significant pain   Exercises - Turn Palm Facing Up & Down  - 4-6 x daily - 5-10 reps - Bend and Pull Back Wrist SLOWLY  - 4 x daily - 10-15  reps - Wrist AROM Dart Throwers Motion  - 4 x daily - 10-15 reps - Wrist Flexion Stretch  - 4 x daily - 3-5 reps - 15 sec hold - Wrist Prayer Stretch  - 4 x daily - 3-5 reps - 15 sec hold - Stretch thumb toward base of small finger (put hand in LAP)  - 2-3 x daily - 3-5 reps - 15 sec hold   In-Hand Manipulation Skills  Rotation:  Hold pen, try to "twirl" like a baton, keeping parallel (or flat) with surface of table. Try going BOTH directions 10x   Flip:  Hold pen in writing position,  flip in an arch to "erase" position, then back to "write" position. Do not lift hand off table.  10x   Translation:  Open hand palm up,  put an object in your palm and then use your fingers and thumb to move it to the tips of your fingers, pinched against your thumb. (bigger is easier (fat marker), smaller is harder (penny)) 10x   Shift:  Hold pen like a dart, start "shifting" it forward & backwards from tip to base (like putting a key in a key hole) 10x    PATIENT EDUCATION: Education details: See tx section above for details  Person educated: Patient Education method: Verbal Instruction, Teach back, Handouts  Education comprehension: States and demonstrates understanding, Additional Education required    HOME EXERCISE PROGRAM: Access Code: [email or web address] URL: [email or web address] Date: 12/11/2023 Prepared by: Melvenia Ada     GOALS: Goals reviewed with patient? Yes   SHORT TERM GOALS: (STG required if POC>30 days) Target Date: 12/28/23  Pt will obtain protective, custom orthotic. Goal status: 12/11/23   MET   2.  Pt will demo/state understanding of initial HEP to improve pain levels and prerequisite motion. Goal status: 12/24/23: MET   LONG TERM GOALS: Target Date: 02/08/24  Pt will improve functional ability by decreased impairment per PSFS assessment from 1.75 to 6 or better, for better quality of life. Goal status: INITIAL  2.  Pt will improve grip strength in Rt hand from unsafe to test to at least 20 lbs for functional use at home and in IADLs. Goal status: INITIAL  3.  Pt will improve A/ROM in right wrist flexion/extension from 15/30 degrees respectively to at least 55 degrees each, to have functional motion for tasks like reach and grasp.  Goal status: INITIAL  4.  Pt will improve strength in right wrist flexion/extension from apparent 3 -/5 MMT to at least 4+/5 MMT to have increased functional ability to carry out selfcare and higher-level homecare tasks with less difficulty. Goal status: INITIAL  5.  Pt will improve coordination skills in right hand and arm, as seen by within functional limit score on nine-hole peg testing to have increased functional ability to carry out fine motor tasks (fasteners, etc.) and more complex, coordinated IADLs (meal prep, sports, etc.).  Goal status: INITIAL  6.  Pt will decrease pain at worst from 10/10 to 3/10 or better to have better sleep and occupational participation in daily roles. Goal status: INITIAL   ASSESSMENT:  CLINICAL IMPRESSION: 12/24/23: She is doing excellent compared to last week, tolerating stretches and coordination activities well.  EvaL: Patient is a 68 y.o. female who was seen today for occupational therapy evaluation for stiffness, swelling, pain, decreased coordination and functional activity with the right hand and arm after breaking her distal radius and subsequent ORIF surgery.  The patient will benefit from outpatient occupational therapy  to decrease symptoms, improve functional upper extremity use, and increase  quality of life.    PLAN:  OT FREQUENCY: 1-2x/week  OT DURATION: 8 weeks through 02/08/24 and up to 12 total visits as needed   PLANNED INTERVENTIONS: 97535 self care/ADL training, [id number] therapeutic exercise, 97530 therapeutic activity, 97112 neuromuscular re-education, 97140 manual therapy, 97035 ultrasound, 97032 electrical stimulation (manual), 97760 Orthotic Initial, [id number] Orthotic/Prosthetic subsequent, compression bandaging, Dry needling, energy conservation, coping strategies training, and patient/family education  RECOMMENDED OTHER SERVICES: none now    CONSULTED AND AGREED WITH PLAN OF CARE: Patient  PLAN FOR NEXT SESSION:   And next follow-up, check motion, check stretches, upgraded as tolerated.  She may miss a week due to frequent doctors appointments she states.  Melvenia Ada, OTR/L, CHT  12/24/2023, 12:36 PM

## 2023-12-24 ENCOUNTER — Encounter: Payer: Self-pay | Admitting: Rehabilitative and Restorative Service Providers"

## 2023-12-24 ENCOUNTER — Ambulatory Visit: Admitting: Rehabilitative and Restorative Service Providers"

## 2023-12-24 DIAGNOSIS — R600 Localized edema: Secondary | ICD-10-CM | POA: Diagnosis not present

## 2023-12-24 DIAGNOSIS — R278 Other lack of coordination: Secondary | ICD-10-CM | POA: Diagnosis not present

## 2023-12-24 DIAGNOSIS — M6281 Muscle weakness (generalized): Secondary | ICD-10-CM

## 2023-12-24 DIAGNOSIS — M25531 Pain in right wrist: Secondary | ICD-10-CM

## 2023-12-24 DIAGNOSIS — M25631 Stiffness of right wrist, not elsewhere classified: Secondary | ICD-10-CM

## 2023-12-25 ENCOUNTER — Ambulatory Visit (INDEPENDENT_AMBULATORY_CARE_PROVIDER_SITE_OTHER): Admitting: Orthopedic Surgery

## 2023-12-25 ENCOUNTER — Other Ambulatory Visit: Payer: Self-pay

## 2023-12-25 DIAGNOSIS — Z8781 Personal history of (healed) traumatic fracture: Secondary | ICD-10-CM

## 2023-12-25 DIAGNOSIS — Z9889 Other specified postprocedural states: Secondary | ICD-10-CM

## 2023-12-25 NOTE — Progress Notes (Signed)
   Kristen English - 68 y.o. female MRN [id number]  Date of birth: 12-30-1955  Office Visit Note: Visit Date: 12/25/2023 PCP: Ransom Other, MD Referred by: Ransom Other, MD  Subjective:  HPI: Kristen English is a 68 y.o. female who presents today for follow up 4 weeks status post right distal radius fracture internal fixation, intra-articular 3 + segments and closed treatment of ulnar styloid fracture.  She is doing very well overall, pain is well-controlled.  Is making nice progress with occupational therapy.  Pertinent ROS were reviewed with the patient and found to be negative unless otherwise specified above in HPI.   Assessment & Plan: Visit Diagnoses:  1. S/P ORIF (open reduction internal fixation) fracture     Plan: X-rays obtained today show stable appearance of the distal radius fracture with appropriate hardware fixation.  Continue with occupational therapy and removable orthosis.  Will remain with range of motion protocol for the time being, wait until week 8 to begin strengthening.  Return to me in approximately 4 weeks for repeat clinical and radiographic check.  She expressed full understanding.  Follow-up: No follow-ups on file.   Meds & Orders: No orders of the defined types were placed in this encounter.   Orders Placed This Encounter  Procedures   XR Wrist Complete Right     Procedures: No procedures performed       Objective:   Vital Signs: There were no vitals taken for this visit.  Ortho Exam Right wrist: - Well-wheeled volar incision, skin edges well-approximated without erythema or drainage - Distal range of motion is well-preserved, AIN/PIN/interosseous intact, hand mains warm well-perfused, sensation intact in median/radial/ulnar distributions - Gentle wrist range of motion flexion extension without significant pain or crepitus, wrist range of motion flexion/extension 35/25, pronation/supination 50/50 without significant pain, DRUJ  stable  Imaging: XR Wrist Complete Right Result Date: 12/25/2023 X-rays of the right wrist demonstrate well fixated distal radius fracture, hardware remains well-fixed in all planes without evidence of failure or migration.     Mohmed Farver Afton Alderton, M.D. [facility], Hand Surgery

## 2024-01-01 ENCOUNTER — Encounter: Admitting: Rehabilitative and Restorative Service Providers"

## 2024-01-03 DIAGNOSIS — S62101A Fracture of unspecified carpal bone, right wrist, initial encounter for closed fracture: Secondary | ICD-10-CM | POA: Diagnosis not present

## 2024-01-03 DIAGNOSIS — I10 Essential (primary) hypertension: Secondary | ICD-10-CM | POA: Diagnosis not present

## 2024-01-03 DIAGNOSIS — Z23 Encounter for immunization: Secondary | ICD-10-CM | POA: Diagnosis not present

## 2024-01-03 DIAGNOSIS — M5116 Intervertebral disc disorders with radiculopathy, lumbar region: Secondary | ICD-10-CM | POA: Diagnosis not present

## 2024-01-03 DIAGNOSIS — S3210XA Unspecified fracture of sacrum, initial encounter for closed fracture: Secondary | ICD-10-CM | POA: Diagnosis not present

## 2024-01-04 NOTE — Therapy (Incomplete)
 OUTPATIENT OCCUPATIONAL THERAPY TREATMENT NOTE  Patient Name: Kristen English MRN: [id number] DOB:[birth year], 68 y.o., female Today's Date: 01/04/2024  PCP: Ransom BEAL MD REFERRING PROVIDER:  Arlinda Buster, MD    END OF SESSION:     Past Medical History:  Diagnosis Date   Arthritis    Chronic kidney disease    protein in urine   Diabetes mellitus without complication (HCC)    Gout    Hypertension    Sleep disorder 06/29/2014   Past Surgical History:  Procedure Laterality Date   AV FISTULA PLACEMENT Right 10/07/2019   Procedure: ARTERIOVENOUS (AV) FISTULA CREATION RIGHT UPPER ARM;  Surgeon: Eliza Lonni RAMAN, MD;  Location: [facility] OR;  Service: Vascular;  Laterality: Right;   COLONOSCOPY WITH PROPOFOL  N/A 05/11/2014   Procedure: COLONOSCOPY WITH PROPOFOL ;  Surgeon: Gladis MARLA Louder, MD;  Location: [facility];  Service: Endoscopy;  Laterality: N/A;   FOOT SURGERY Left 2007   IR FLUORO GUIDE CV LINE RIGHT  10/03/2019   IR US  GUIDE VASC ACCESS RIGHT  10/03/2019   JOINT REPLACEMENT  2009   bilateral knees   OPEN REDUCTION INTERNAL FIXATION (ORIF) DISTAL RADIAL FRACTURE Right 11/27/2023   Procedure: OPEN REDUCTION INTERNAL FIXATION (ORIF) DISTAL RADIUS FRACTURE;  Surgeon: Arlinda Buster, MD;  Location: [facility];  Service: Orthopedics;  Laterality: Right;   TOTAL KNEE ARTHROPLASTY     bilateral   Patient Active Problem List   Diagnosis Date Noted   Closed fracture of right distal radius 11/27/2023   Chronic diastolic heart failure (HCC) 11/22/2023   Decreased estrogen level 11/22/2023   Diabetic peripheral neuropathy associated with type 2 diabetes mellitus (HCC) 11/22/2023   Drug-induced immunodeficiency 11/22/2023   History of kidney transplant 11/22/2023   History of primary malignant neoplasm of kidney 11/22/2023   Hardening of the aorta (main artery of the heart) 11/22/2023   Mixed hyperlipidemia 11/22/2023   Stage 3a chronic kidney disease  (HCC) 11/22/2023   Pure hypercholesterolemia 11/22/2023   Anemia 11/22/2023   Stage 4 chronic kidney disease (HCC) 11/22/2023   Stage 5 chronic kidney disease (HCC) 11/22/2023   Diabetic hypoglycemia (HCC) 11/22/2023   Hypomagnesemia 05/18/2021   Weight gain 05/18/2021   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   Encounter for monitoring tacrolimus  therapy 08/04/2020   Uses self-applied continuous glucose monitoring device 08/04/2020   Acquired hypothyroidism 06/08/2020   Hyperparathyroidism 06/08/2020   Perinephric fluid collection of kidney transplant 06/08/2020   Uncontrolled type 2 diabetes mellitus with hyperglycemia (HCC) 06/08/2020   Encounter for aftercare following kidney transplant 05/12/2020   Immunosuppression 05/12/2020   Other disorders of phosphorus metabolism 01/01/2020   Other bacterial infections of unspecified site 12/30/2019   Hypokalemia 12/05/2019   Hyperkalemia 11/05/2019   Encounter for immunization 10/18/2019   Underimmunization status 10/18/2019   Allergy, unspecified, sequela 10/10/2019   Anaphylactic shock, unspecified, sequela 10/10/2019   Anemia in chronic kidney disease 10/10/2019   Chest pain, unspecified 10/10/2019   Coagulation defect, unspecified 10/10/2019   Complication of vascular dialysis catheter 10/10/2019   Dyspnea, unspecified 10/10/2019   End stage renal disease (HCC) 10/10/2019   Iron deficiency anemia, unspecified 10/10/2019   Moderate protein-calorie malnutrition 10/10/2019   Pain, unspecified 10/10/2019   Primary generalized (osteo)arthritis 10/10/2019   Proteinuria, unspecified 10/10/2019   Pruritus, unspecified 10/10/2019   Secondary hyperparathyroidism of renal origin 10/10/2019   Type 2 diabetes mellitus with diabetic chronic kidney disease (HCC) 10/10/2019   Acute respiratory failure with hypercapnia (HCC) 10/08/2019  AKI (acute kidney injury) 10/02/2019   Essential hypertension 10/02/2019   Normocytic anemia  10/02/2019   Normal anion gap metabolic acidosis 10/02/2019   Hypoglycemia associated with type 2 diabetes mellitus (HCC) 09/27/2019   Neoplasm of uncertain behavior of right kidney 09/08/2019   Renal cell carcinoma, right (HCC) 09/08/2019   Chronic renal insufficiency, stage 4 (severe) 09/05/2018   Gout 06/29/2014   Sleep disorder 06/29/2014   Type 2 diabetes mellitus with chronic kidney disease, with long-term current use of insulin (HCC) 06/29/2014   HTN (hypertension) 06/29/2014   Monoclonal gammopathy of undetermined significance (MGUS) 04/27/2014   Monoclonal (M) protein disease, 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    ONSET DATE: DOS 11/28/23  REFERRING DIAG: M25.531 (ICD-10-CM) - Pain in right wrist    THERAPY DIAG:  No diagnosis found.  Rationale for Evaluation and Treatment: Rehabilitation  PERTINENT HISTORY: Needs OT splint 2 weeks s/p Right radius ORIF (11/28/23)  She did break her Rt wrist approx 30 years ago and it had some limited mobility She states her hip gave out and she fell.  She will be meeting with physical therapy later this week to address her hip.  As a result of her fall, she broke her right wrist and underwent ORIF surgery.  She is now having pain, hypersensitivity, weakness and is very anxious.  PRECAUTIONS: None  RED FLAGS: None   WEIGHT BEARING RESTRICTIONS: Yes nonweightbearing in right wrist and hand   SUBJECTIVE:   SUBJECTIVE STATEMENT: She is now 6 weeks s/p Rt DRF, ORIF. She states ***    having many doctors appointments next week and she may need to miss therapy.      PAIN:  Are you having pain? Yes: NPRS scale: ** 0/10 at rest, up to  *** 10/10 at worst in past week, momentarily  Pain location: Right wrist Pain description: Aching Aggravating factors: Movement or attempted weightbearing Relieving factors: Rest    PATIENT GOALS: To improve right hand and arm to return to full functional ability  NEXT MD VISIT: As  needed    OBJECTIVE: (All objective assessments below are from initial evaluation on: 12/11/23 unless otherwise specified.)   HAND DOMINANCE: Right   ADLs: Overall ADLs: States decreased ability to grab, hold household objects, pain and difficulty to open containers, perform FMS tasks (manipulate fasteners on clothing), mild to moderate bathing problems as well.    FUNCTIONAL OUTCOME MEASURES: Eval: Patient Specific Functional Scale: 1.75 (drive, cook, wash, make bed)  (Higher Score  =  Better Ability for the Selected Tasks)      UPPER EXTREMITY ROM     Shoulder to Wrist AROM Rt eval Rt 12/24/23 Rt 01/08/24  Shoulder flexion     Shoulder abduction     Shoulder extension     Shoulder internal rotation     Shoulder external rotation     Elbow flexion 145 150   Elbow extension (-25) Now (-18* bilaterally)    Forearm supination 35 75   Forearm pronation  15 84   Wrist flexion 15 10 ***  Wrist extension 30 22 ***  Wrist ulnar deviation     Wrist radial deviation     Functional dart thrower's motion (F-DTM) in ulnar flexion     F-DTM in radial extension      (Blank rows = not tested)   Hand AROM Rt eval Rt 12/24/23  Full Fist Ability (or Gap to Distal Palmar Crease) 4cm gap  from tip of MF to [facility] Full fist  Thumb Opposition  (Kapandji Scale)  3/10 7/10  Thumb MCP (0-60)    Thumb IP (0-80)    Thumb Radial Abduction Span     Thumb Palmar Abduction Span     Index MCP (0-90)     Index PIP (0-100)     Index DIP (0-70)     Long MCP (0-90)     Long PIP (0-100)     Long DIP (0-70)     Ring MCP (0-90)     Ring PIP (0-100)     Ring DIP (0-70)     Little MCP (0-90)     Little PIP (0-100)     Little DIP (0-70)     (Blank rows = not tested)   UPPER EXTREMITY MMT:    Eval:  NT at eval due to recent and still healing injuries. Will be tested when appropriate.   MMT Rt TBD  Elbow flexion   Elbow extension   Forearm supination   Forearm pronation   Wrist flexion    Wrist extension   Wrist ulnar deviation   Wrist radial deviation   (Blank rows = not tested)  HAND FUNCTION: Eval: Observed weakness in affected Rt hand. Details TBD when safe  Grip strength Right: TBD lbs, Left: TBD lbs   COORDINATION: 01/08/24: 9 Hole Peg Test Right: ***sec (*** sec is WFL)   SENSATION: Eval:  Light touch intact today,   though diminished, also somewhat hypersensitive around sx area    EDEMA:   Eval: Moderate swollen in Rt hand and wrist today, 17.5cm circumferentially around right distal wrist crease (15.2cm in Lt wrist)  OBSERVATIONS:   Eval: A bit hypersensitive, overtly stiff, a bit anxious.  Otherwise surgical area looks excellent.   Right radius ORIF  TODAY'S TREATMENT:  01/08/24: ***  And next follow-up, check motion, check stretches, upgraded as tolerated.  She may miss a week due to frequent doctors appointments she states.    12/24/23: OT starts by adjusting her orthosis to make it more comfortable around the thumb and ulnar and radial styloids.  This involved redipping the thermoplastic and forming into her arm again.  Afterwards it fits much better she states.  We reviewed safety/self-care, and no significant weightbearing, but also now weaning from the orthosis for light functional tasks for 5 times a day for 10 to 15-minute periods for nothing weighing more than 5 pounds.   Active range of motion exercises yield excellent measurement results today.  The wrist is still a bit stiff, but she reminds OT that she has a chronic wrist injury and chronic stiffness.   OT upgrades her home exercise program to include in hand manipulation skills as well as stretches at the wrist and thumb.  She tolerates these well, and was asked to do these 4 times a day at home.  She leaves having no questions and no significant pain   Exercises - Turn Palm Facing Up & Down  - 4-6 x daily - 5-10 reps - Bend and Pull Back Wrist SLOWLY  - 4 x daily - 10-15 reps - Wrist  AROM Dart Throwers Motion  - 4 x daily - 10-15 reps - Wrist Flexion Stretch  - 4 x daily - 3-5 reps - 15 sec hold - Wrist Prayer Stretch  - 4 x daily - 3-5 reps - 15 sec hold - Stretch thumb toward base of small finger (put hand in LAP)  - 2-3 x daily - 3-5 reps - 15 sec hold  In-Hand Manipulation Skills  Rotation:  Hold pen, try to "twirl" like a baton, keeping parallel (or flat) with surface of table. Try going BOTH directions 10x   Flip:  Hold pen in writing position,  flip in an arch to "erase" position, then back to "write" position. Do not lift hand off table.  10x   Translation:  Open hand palm up,  put an object in your palm and then use your fingers and thumb to move it to the tips of your fingers, pinched against your thumb. (bigger is easier (fat marker), smaller is harder (penny)) 10x   Shift:  Hold pen like a dart, start "shifting" it forward & backwards from tip to base (like putting a key in a key hole) 10x    PATIENT EDUCATION: Education details: See tx section above for details  Person educated: Patient Education method: Verbal Instruction, Teach back, Handouts  Education comprehension: States and demonstrates understanding, Additional Education required    HOME EXERCISE PROGRAM: Access Code: [email or web address] URL: [email or web address] Date: 12/11/2023 Prepared by: Melvenia Ada     GOALS: Goals reviewed with patient? Yes   SHORT TERM GOALS: (STG required if POC>30 days) Target Date: 12/28/23  Pt will obtain protective, custom orthotic. Goal status: 12/11/23  MET   2.  Pt will demo/state understanding of initial HEP to improve pain levels and prerequisite motion. Goal status: 12/24/23: MET   LONG TERM GOALS: Target Date: 02/08/24  Pt will improve functional ability by decreased impairment per PSFS assessment from 1.75 to 6 or better, for better quality of life. Goal status: INITIAL  2.  Pt will improve grip strength in Rt hand from  unsafe to test to at least 20 lbs for functional use at home and in IADLs. Goal status: INITIAL  3.  Pt will improve A/ROM in right wrist flexion/extension from 15/30 degrees respectively to at least 55 degrees each, to have functional motion for tasks like reach and grasp.  Goal status: INITIAL  4.  Pt will improve strength in right wrist flexion/extension from apparent 3 -/5 MMT to at least 4+/5 MMT to have increased functional ability to carry out selfcare and higher-level homecare tasks with less difficulty. Goal status: INITIAL  5.  Pt will improve coordination skills in right hand and arm, as seen by within functional limit score on nine-hole peg testing to have increased functional ability to carry out fine motor tasks (fasteners, etc.) and more complex, coordinated IADLs (meal prep, sports, etc.).  Goal status: INITIAL  6.  Pt will decrease pain at worst from 10/10 to 3/10 or better to have better sleep and occupational participation in daily roles. Goal status: INITIAL   ASSESSMENT:  CLINICAL IMPRESSION: 01/08/24: ***  12/24/23: She is doing excellent compared to last week, tolerating stretches and coordination activities well.  EvaL: Patient is a 68 y.o. female who was seen today for occupational therapy evaluation for stiffness, swelling, pain, decreased coordination and functional activity with the right hand and arm after breaking her distal radius and subsequent ORIF surgery.  The patient will benefit from outpatient occupational therapy to decrease symptoms, improve functional upper extremity use, and increase quality of life.    PLAN:  OT FREQUENCY: 1-2x/week  OT DURATION: 8 weeks through 02/08/24 and up to 12 total visits as needed   PLANNED INTERVENTIONS: 97535 self care/ADL training, 02889 therapeutic exercise, 97530 therapeutic activity, 97112 neuromuscular re-education, 97140 manual therapy, 97035 ultrasound, 97032 electrical stimulation (manual), [id number] Orthotic  Initial, [id number] Orthotic/Prosthetic subsequent, compression  bandaging, Dry needling, energy conservation, coping strategies training, and patient/family education  RECOMMENDED OTHER SERVICES: none now    CONSULTED AND AGREED WITH PLAN OF CARE: Patient  PLAN FOR NEXT SESSION:   ***   Melvenia Ada, OTR/L, CHT  01/04/2024, 10:09 AM

## 2024-01-07 DIAGNOSIS — E1122 Type 2 diabetes mellitus with diabetic chronic kidney disease: Secondary | ICD-10-CM | POA: Diagnosis not present

## 2024-01-07 DIAGNOSIS — N2581 Secondary hyperparathyroidism of renal origin: Secondary | ICD-10-CM | POA: Diagnosis not present

## 2024-01-07 DIAGNOSIS — Z940 Kidney transplant status: Secondary | ICD-10-CM | POA: Diagnosis not present

## 2024-01-07 DIAGNOSIS — N189 Chronic kidney disease, unspecified: Secondary | ICD-10-CM | POA: Diagnosis not present

## 2024-01-08 ENCOUNTER — Encounter: Admitting: Rehabilitative and Restorative Service Providers"

## 2024-01-14 ENCOUNTER — Telehealth: Payer: Self-pay | Admitting: Pharmacist

## 2024-01-14 NOTE — Progress Notes (Signed)
   01/14/2024  Patient ID: Kristen English, female   DOB: 13-Feb-1956, 68 y.o.   MRN: [id number]  Called patient to check on BG status. Reports she is not at home and needs to apply a new sensor currently. Therefore, does not have any current readings or reader information to provide. Also reports she doesn't always bring the reader around with her which can be a major concern for BG tracking.   Confirmed insulin doses that are listed within the chart and reports no concerns with BG readings at this time.  Asked if she could potentially bring the reader to 11/5 visit with Dr. Husain for download. Said she could definitely do this and will bring it for review.   No further concerns at this time and aware of upcoming appointment.   Added to schedule to be at office on 11/5 at 8:15AM for CGM download from the patient.    Aloysius Lewis, PharmD, [facility] [place] [facility] Phone Number: [phone]

## 2024-01-15 ENCOUNTER — Encounter: Admitting: Rehabilitative and Restorative Service Providers"

## 2024-01-18 DIAGNOSIS — E1165 Type 2 diabetes mellitus with hyperglycemia: Secondary | ICD-10-CM | POA: Diagnosis not present

## 2024-01-18 DIAGNOSIS — I5032 Chronic diastolic (congestive) heart failure: Secondary | ICD-10-CM | POA: Diagnosis not present

## 2024-01-18 DIAGNOSIS — E1122 Type 2 diabetes mellitus with diabetic chronic kidney disease: Secondary | ICD-10-CM | POA: Diagnosis not present

## 2024-01-18 DIAGNOSIS — E1140 Type 2 diabetes mellitus with diabetic neuropathy, unspecified: Secondary | ICD-10-CM | POA: Diagnosis not present

## 2024-01-18 DIAGNOSIS — N1831 Chronic kidney disease, stage 3a: Secondary | ICD-10-CM | POA: Diagnosis not present

## 2024-01-18 DIAGNOSIS — E039 Hypothyroidism, unspecified: Secondary | ICD-10-CM | POA: Diagnosis not present

## 2024-01-18 NOTE — Therapy (Signed)
 OUTPATIENT OCCUPATIONAL THERAPY TREATMENT & DISCHARGE NOTE  Patient Name: Kristen English MRN: [id number] DOB:10/31/1955, 68 y.o., female Today's Date: 01/22/2024  PCP: Ransom BEAL MD REFERRING PROVIDER:  Arlinda Buster, MD                 OCCUPATIONAL THERAPY DISCHARGE SUMMARY  Visits from Start of Care: 3  Current functional level related to goals / functional outcomes: Pt has met all goals to satisfactory levels and is pleased with outcomes.   Remaining deficits: Pt has no more significant functional deficits or pain.   Education / Equipment: Pt has all needed materials and education. Pt understands how to continue on with self-management. See tx notes for more details.   Patient agrees to discharge due to max benefits received from outpatient occupational therapy / hand therapy at this time.   Melvenia Ada, OTR/L, CHT 01/22/2024          END OF SESSION:  OT End of Session - 01/22/24 1101     Visit Number 3    Number of Visits 12    Date for Recertification  02/08/24    Authorization Type [employer]    OT Start Time 1101    OT Stop Time 1124    OT Time Calculation (min) 23 min    Activity Tolerance Patient tolerated treatment well;No increased pain    Behavior During Therapy [facility] for tasks assessed/performed            Past Medical History:  Diagnosis Date   Arthritis    Chronic kidney disease    protein in urine   Diabetes mellitus without complication (HCC)    Gout    Hypertension    Sleep disorder 06/29/2014   Past Surgical History:  Procedure Laterality Date   AV FISTULA PLACEMENT Right 10/07/2019   Procedure: ARTERIOVENOUS (AV) FISTULA CREATION RIGHT UPPER ARM;  Surgeon: Eliza Lonni RAMAN, MD;  Location: [facility] OR;  Service: Vascular;  Laterality: Right;   COLONOSCOPY WITH PROPOFOL  N/A 05/11/2014   Procedure: COLONOSCOPY WITH PROPOFOL ;  Surgeon: Gladis MARLA Louder, MD;  Location: [facility];  Service: Endoscopy;   Laterality: N/A;   FOOT SURGERY Left 2007   IR FLUORO GUIDE CV LINE RIGHT  10/03/2019   IR US  GUIDE VASC ACCESS RIGHT  10/03/2019   JOINT REPLACEMENT  2009   bilateral knees   OPEN REDUCTION INTERNAL FIXATION (ORIF) DISTAL RADIAL FRACTURE Right 11/27/2023   Procedure: OPEN REDUCTION INTERNAL FIXATION (ORIF) DISTAL RADIUS FRACTURE;  Surgeon: Arlinda Buster, MD;  Location: [facility];  Service: Orthopedics;  Laterality: Right;   TOTAL KNEE ARTHROPLASTY     bilateral   Patient Active Problem List   Diagnosis Date Noted   Closed fracture of right distal radius 11/27/2023   Chronic diastolic heart failure (HCC) 11/22/2023   Decreased estrogen level 11/22/2023   Diabetic peripheral neuropathy associated with type 2 diabetes mellitus (HCC) 11/22/2023   Drug-induced immunodeficiency 11/22/2023   History of kidney transplant 11/22/2023   History of primary malignant neoplasm of kidney 11/22/2023   Hardening of the aorta (main artery of the heart) 11/22/2023   Mixed hyperlipidemia 11/22/2023   Stage 3a chronic kidney disease (HCC) 11/22/2023   Pure hypercholesterolemia 11/22/2023   Anemia 11/22/2023   Stage 4 chronic kidney disease (HCC) 11/22/2023   Stage 5 chronic kidney disease (HCC) 11/22/2023   Diabetic hypoglycemia (HCC) 11/22/2023   Hypomagnesemia 05/18/2021   Weight gain 05/18/2021   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  Encounter for monitoring tacrolimus  therapy 08/04/2020   Uses self-applied continuous glucose monitoring device 08/04/2020   Acquired hypothyroidism 06/08/2020   Hyperparathyroidism 06/08/2020   Perinephric fluid collection of kidney transplant 06/08/2020   Uncontrolled type 2 diabetes mellitus with hyperglycemia (HCC) 06/08/2020   Encounter for aftercare following kidney transplant 05/12/2020   Immunosuppression 05/12/2020   Other disorders of phosphorus metabolism 01/01/2020   Other bacterial infections of unspecified site 12/30/2019    Hypokalemia 12/05/2019   Hyperkalemia 11/05/2019   Encounter for immunization 10/18/2019   Underimmunization status 10/18/2019   Allergy, unspecified, sequela 10/10/2019   Anaphylactic shock, unspecified, sequela 10/10/2019   Anemia in chronic kidney disease 10/10/2019   Chest pain, unspecified 10/10/2019   Coagulation defect, unspecified 10/10/2019   Complication of vascular dialysis catheter 10/10/2019   Dyspnea, unspecified 10/10/2019   End stage renal disease (HCC) 10/10/2019   Iron deficiency anemia, unspecified 10/10/2019   Moderate protein-calorie malnutrition 10/10/2019   Pain, unspecified 10/10/2019   Primary generalized (osteo)arthritis 10/10/2019   Proteinuria, unspecified 10/10/2019   Pruritus, unspecified 10/10/2019   Secondary hyperparathyroidism of renal origin 10/10/2019   Type 2 diabetes mellitus with diabetic chronic kidney disease (HCC) 10/10/2019   Acute respiratory failure with hypercapnia (HCC) 10/08/2019   AKI (acute kidney injury) 10/02/2019   Essential hypertension 10/02/2019   Normocytic anemia 10/02/2019   Normal anion gap metabolic acidosis 10/02/2019   Hypoglycemia associated with type 2 diabetes mellitus (HCC) 09/27/2019   Neoplasm of uncertain behavior of right kidney 09/08/2019   Renal cell carcinoma, right (HCC) 09/08/2019   Chronic renal insufficiency, stage 4 (severe) 09/05/2018   Gout 06/29/2014   Sleep disorder 06/29/2014   Type 2 diabetes mellitus with chronic kidney disease, with long-term current use of insulin (HCC) 06/29/2014   HTN (hypertension) 06/29/2014   Monoclonal gammopathy of undetermined significance (MGUS) 04/27/2014   Monoclonal (M) protein disease, 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    ONSET DATE: DOS 11/28/23  REFERRING DIAG: M25.531 (ICD-10-CM) - Pain in right wrist    THERAPY DIAG:  Localized edema  Muscle weakness (generalized)  Pain in right wrist  Stiffness of right wrist, not elsewhere classified  Other lack  of coordination  Rationale for Evaluation and Treatment: Rehabilitation  PERTINENT HISTORY: Needs OT splint 2 weeks s/p Right radius ORIF (11/28/23)  She did break her Rt wrist approx 30 years ago and it had some limited mobility She states her hip gave out and she fell.  She will be meeting with physical therapy later this week to address her hip.  As a result of her fall, she broke her right wrist and underwent ORIF surgery.  She is now having pain, hypersensitivity, weakness and is very anxious.  PRECAUTIONS: None  RED FLAGS: None   WEIGHT BEARING RESTRICTIONS: Yes nonweightbearing in right wrist and hand   SUBJECTIVE:   SUBJECTIVE STATEMENT: She is now ~8 weeks s/p Rt DRF, ORIF. She states not having any significant pain in the past 2 weeks.  She states that she is managing her symptoms well and weaning from her brace.    PAIN:  Are you having pain? Yes: NPRS scale:  0/10 at rest Pain location: Right wrist Pain description: Aching Aggravating factors: Movement or attempted weightbearing Relieving factors: Rest    PATIENT GOALS: To improve right hand and arm to return to full functional ability  NEXT MD VISIT: As needed    OBJECTIVE: (All objective assessments below are from initial evaluation on: 12/11/23 unless otherwise specified.)   HAND  DOMINANCE: Right   ADLs: Overall ADLs: States no significant functional problems now  FUNCTIONAL OUTCOME MEASURES: 01/22/24: PSFS:  9  Eval: Patient Specific Functional Scale: 1.75 (drive, cook, wash, make bed)  (Higher Score  =  Better Ability for the Selected Tasks)      UPPER EXTREMITY ROM     Shoulder to Wrist AROM Rt eval Rt 12/24/23 Rt 01/22/24  Shoulder flexion     Shoulder abduction     Shoulder extension     Shoulder internal rotation     Shoulder external rotation     Elbow flexion 145 150   Elbow extension (-25) Now (-18* bilaterally)    Forearm supination 35 75 68  Forearm pronation  15 84 74  Wrist  flexion 15 10 47  Wrist extension 30 22 28   Wrist ulnar deviation     Wrist radial deviation     Functional dart thrower's motion (F-DTM) in ulnar flexion     F-DTM in radial extension      (Blank rows = not tested)   Hand AROM Rt eval Rt 12/24/23 Rt 01/22/24  Full Fist Ability (or Gap to Distal Palmar Crease) 4cm gap  from tip of MF to [facility] Full fist Full fist   Thumb Opposition  (Kapandji Scale)  3/10 7/10 9/10  Thumb MCP (0-60)     Thumb IP (0-80)     Thumb Radial Abduction Span      Thumb Palmar Abduction Span      Index MCP (0-90)      Index PIP (0-100)      Index DIP (0-70)      Long MCP (0-90)      Long PIP (0-100)      Long DIP (0-70)      Ring MCP (0-90)      Ring PIP (0-100)      Ring DIP (0-70)      Little MCP (0-90)      Little PIP (0-100)      Little DIP (0-70)      (Blank rows = not tested)   HAND FUNCTION: 01/22/24: Grip strength Right: 38 lbs, Left: 49 lbs   COORDINATION: 01/22/24 no notable issues or complaints today  SENSATION: Eval:  Light touch intact today,   though diminished, also somewhat hypersensitive around sx area    EDEMA:   Eval: Moderate swollen in Rt hand and wrist today, 17.5cm circumferentially around right distal wrist crease (15.2cm in Lt wrist)  OBSERVATIONS:   Eval: A bit hypersensitive, overtly stiff, a bit anxious.  Otherwise surgical area looks excellent.   Right radius ORIF  TODAY'S TREATMENT:  01/22/24: She starts with active range of motion for new measures which show some improvements but also some areas of slight or tightness from using her arm more.  It is all within normal limits for her though as she has broken her wrist in the past.  She is happy with her current range of motion.  OT also checks her grip strength which is in functional limits now.  OT educates on strengthening for grip and forearm training as bolded below.  She is recommended to do this for at least 2 weeks and to use caution with weight over 10 pounds  for 2 to 4 weeks.  We reviewed weaning from orthosis and safety with self-care tasks.  She states that she can manage her symptoms on her own, she will continue to stretch and strengthen for at least 2 weeks and  she will use caution for 2 to 4 weeks as discussed.  She wants to discharge today, she rates her functional ability very good and is pleased with her progress.    Exercises - Wrist Flexion Stretch  - 4 x daily - 3-5 reps - 15 sec hold - Wrist Prayer Stretch  - 4 x daily - 3-5 reps - 15 sec hold - Towel Roll Grip with Forearm in Neutral  - 3 x daily - 5 reps - 10 sec hold - Wrist Extension with Resistance  - 2-4 x daily - 1-2 sets - 10-15 reps - Wrist Flexion with Resistance  - 2-4 x daily - 1-2 sets - 10-15 reps   PATIENT EDUCATION: Education details: See tx section above for details  Person educated: Patient Education method: Verbal Instruction, Teach back, Handouts  Education comprehension: States and demonstrates understanding,   HOME EXERCISE PROGRAM: Access Code: [email or web address] URL: [email or web address] Date: 12/11/2023 Prepared by: Melvenia Ada     GOALS: Goals reviewed with patient? Yes   SHORT TERM GOALS: (STG required if POC>30 days) Target Date: 12/28/23  Pt will obtain protective, custom orthotic. Goal status: 12/11/23  MET   2.  Pt will demo/state understanding of initial HEP to improve pain levels and prerequisite motion. Goal status: 12/24/23: MET   LONG TERM GOALS: Target Date: 02/08/24  Pt will improve functional ability by decreased impairment per PSFS assessment from 1.75 to 6 or better, for better quality of life. Goal status: 01/22/24: MET  2.  Pt will improve grip strength in Rt hand from unsafe to test to at least 20 lbs for functional use at home and in IADLs. Goal status: 01/22/24: MET  3.  Pt will improve A/ROM in right wrist flexion/extension from 15/30 degrees respectively to at least 55 degrees each, to have functional  motion for tasks like reach and grasp.  Goal status: 01/22/24: NOT MET-however she had past wrist fracture and states that she has normal motion now.  Will discharge the school  4.  Pt will improve strength in right wrist flexion/extension from apparent 3 -/5 MMT to at least 4+/5 MMT to have increased functional ability to carry out selfcare and higher-level homecare tasks with less difficulty. Goal status: 01/22/24: MET (through observation today, and she will continue to work on strength at home for 2 weeks)  5.  Pt will improve coordination skills in right hand and arm, as seen by within functional limit score on nine-hole peg testing to have increased functional ability to carry out fine motor tasks (fasteners, etc.) and more complex, coordinated IADLs (meal prep, sports, etc.).  Goal status: 01/22/24: MET she has no coordination complaints now  6.  Pt will decrease pain at worst from 10/10 to 3/10 or better to have better sleep and occupational participation in daily roles. Goal status: 01/22/24: MET she states no pain now   ASSESSMENT:  CLINICAL IMPRESSION: 01/22/24: She request discharge today as she has no pain and good range of motion for her baseline status.  She tolerated grip and arm strengthening, and was advised to take it easy for 2 to 4 weeks with anything weighing more than 10 pounds.  We discussed how to wean from her brace and she states understanding all directions, not having any pain while strengthening, she is happy to discharge today.    PLAN:  OT FREQUENCY: Discharge  OT DURATION: Discharge  PLANNED INTERVENTIONS: 97535 self care/ADL training, 02889 therapeutic exercise, 97530 therapeutic activity, 97112 neuromuscular re-education, 97140 manual  therapy, 97035 ultrasound, 02967 electrical stimulation (manual), [id number] Orthotic Initial, [id number] Orthotic/Prosthetic subsequent, compression bandaging, Dry needling, energy conservation, coping strategies training, and  patient/family education  RECOMMENDED OTHER SERVICES: none now    CONSULTED AND AGREED WITH PLAN OF CARE: Patient  PLAN FOR NEXT SESSION:   Discharge   Melvenia Ada, OTR/L, CHT  01/22/2024, 11:30 AM

## 2024-01-19 DIAGNOSIS — E1122 Type 2 diabetes mellitus with diabetic chronic kidney disease: Secondary | ICD-10-CM | POA: Diagnosis not present

## 2024-01-19 DIAGNOSIS — E1165 Type 2 diabetes mellitus with hyperglycemia: Secondary | ICD-10-CM | POA: Diagnosis not present

## 2024-01-19 DIAGNOSIS — I5032 Chronic diastolic (congestive) heart failure: Secondary | ICD-10-CM | POA: Diagnosis not present

## 2024-01-19 DIAGNOSIS — E1140 Type 2 diabetes mellitus with diabetic neuropathy, unspecified: Secondary | ICD-10-CM | POA: Diagnosis not present

## 2024-01-21 ENCOUNTER — Encounter: Payer: Self-pay | Admitting: Radiology

## 2024-01-22 ENCOUNTER — Encounter: Payer: Self-pay | Admitting: Rehabilitative and Restorative Service Providers"

## 2024-01-22 ENCOUNTER — Ambulatory Visit (INDEPENDENT_AMBULATORY_CARE_PROVIDER_SITE_OTHER): Admitting: Orthopedic Surgery

## 2024-01-22 ENCOUNTER — Ambulatory Visit: Payer: Self-pay

## 2024-01-22 ENCOUNTER — Ambulatory Visit: Admitting: Rehabilitative and Restorative Service Providers"

## 2024-01-22 DIAGNOSIS — Z9889 Other specified postprocedural states: Secondary | ICD-10-CM

## 2024-01-22 DIAGNOSIS — R600 Localized edema: Secondary | ICD-10-CM

## 2024-01-22 DIAGNOSIS — R278 Other lack of coordination: Secondary | ICD-10-CM | POA: Diagnosis not present

## 2024-01-22 DIAGNOSIS — Z8781 Personal history of (healed) traumatic fracture: Secondary | ICD-10-CM

## 2024-01-22 DIAGNOSIS — M6281 Muscle weakness (generalized): Secondary | ICD-10-CM | POA: Diagnosis not present

## 2024-01-22 DIAGNOSIS — M25631 Stiffness of right wrist, not elsewhere classified: Secondary | ICD-10-CM

## 2024-01-22 DIAGNOSIS — M25531 Pain in right wrist: Secondary | ICD-10-CM

## 2024-01-22 NOTE — Progress Notes (Signed)
   Kristen English - 68 y.o. female MRN [id number]  Date of birth: 1955/07/15  Office Visit Note: Visit Date: 01/22/2024 PCP: Ransom Other, MD Referred by: Ransom Other, MD  Subjective:  HPI: Kristen English is a 68 y.o. female who presents today for follow up 8 weeks status post right wrist distal radius fracture open reduction internal fixation, intra-articular 3+ fragments.  She is doing very well overall, is pleased with her progress.  Pertinent ROS were reviewed with the patient and found to be negative unless otherwise specified above in HPI.   Assessment & Plan: Visit Diagnoses:  1. S/P ORIF (open reduction internal fixation) fracture     Plan: Range of motion on examination today is excellent.  I am pleased to see that she is slowly returning to activities as tolerated without significant restriction.  Continue with strengthening protocol moving forward.  X-rays obtained today show stable appearance of the distal radius fracture with hardware fixation.  Follow-up in approximately 1 month for clinical and radiographic recheck.  Follow-up: No follow-ups on file.   Meds & Orders: No orders of the defined types were placed in this encounter.   Orders Placed This Encounter  Procedures   XR Wrist Complete Right     Procedures: No procedures performed       Objective:   Vital Signs: There were no vitals taken for this visit.  Ortho Exam Right wrist: - Flexion/extension 55/30 - Pronation/supination 75/70, DRUJ stable in all planes - Composite fist without significant restriction, sensation intact in all distributions median/radial/ulnar, AIN/PIN/interosseous intact, hand is warm well-perfused  Imaging: XR Wrist Complete Right Result Date: 01/22/2024 X-rays of the right wrist demonstrate stable appearance of the distal radius fracture with hardware fixation.  Hardware remains well-fixed in all planes without evidence of failure or migration.    Jaci Desanto Afton Alderton, M.D. [facility], Hand Surgery

## 2024-01-23 DIAGNOSIS — M8580 Other specified disorders of bone density and structure, unspecified site: Secondary | ICD-10-CM | POA: Diagnosis not present

## 2024-01-23 DIAGNOSIS — E1122 Type 2 diabetes mellitus with diabetic chronic kidney disease: Secondary | ICD-10-CM | POA: Diagnosis not present

## 2024-01-23 DIAGNOSIS — I10 Essential (primary) hypertension: Secondary | ICD-10-CM | POA: Diagnosis not present

## 2024-02-11 ENCOUNTER — Encounter (HOSPITAL_COMMUNITY): Payer: Self-pay

## 2024-02-17 DIAGNOSIS — E1140 Type 2 diabetes mellitus with diabetic neuropathy, unspecified: Secondary | ICD-10-CM | POA: Diagnosis not present

## 2024-02-17 DIAGNOSIS — E039 Hypothyroidism, unspecified: Secondary | ICD-10-CM | POA: Diagnosis not present

## 2024-02-17 DIAGNOSIS — I5032 Chronic diastolic (congestive) heart failure: Secondary | ICD-10-CM | POA: Diagnosis not present

## 2024-02-17 DIAGNOSIS — N1831 Chronic kidney disease, stage 3a: Secondary | ICD-10-CM | POA: Diagnosis not present

## 2024-02-17 DIAGNOSIS — E1165 Type 2 diabetes mellitus with hyperglycemia: Secondary | ICD-10-CM | POA: Diagnosis not present

## 2024-02-17 DIAGNOSIS — E1122 Type 2 diabetes mellitus with diabetic chronic kidney disease: Secondary | ICD-10-CM | POA: Diagnosis not present

## 2024-02-20 ENCOUNTER — Ambulatory Visit: Admitting: Podiatry

## 2024-02-20 ENCOUNTER — Encounter: Payer: Self-pay | Admitting: Podiatry

## 2024-02-20 DIAGNOSIS — E1142 Type 2 diabetes mellitus with diabetic polyneuropathy: Secondary | ICD-10-CM | POA: Diagnosis not present

## 2024-02-20 DIAGNOSIS — M2042 Other hammer toe(s) (acquired), left foot: Secondary | ICD-10-CM | POA: Diagnosis not present

## 2024-02-20 NOTE — Progress Notes (Signed)
  Subjective:  Patient ID: Kristen English, female    DOB: Aug 04, 1955,   MRN: [id number]  Chief Complaint  Patient presents with   Diabetes    I have a corn on my left little toe that is bothering me.  Saw Dr. Husain - 01/23/2024; A1c - 7.7    68 y.o. female presents for concern of corn on her left pinky toe that has been present for quite some time.  She relates it is painful and has been very difficult to wear shoes recently.  Relates constant pain in any shoe.  She has tried some corn pad removers that have helped some but has trouble keeping anything on the toe.  She is diabetic and her last A1c was 7.7. Denies any other pedal complaints. Denies n/v/f/c.   Past Medical History:  Diagnosis Date   Arthritis    Chronic kidney disease    protein in urine   Diabetes mellitus without complication (HCC)    Gout    Hypertension    Sleep disorder 06/29/2014    Objective:  Physical Exam: Vascular: DP/PT pulses 2/4 bilateral. CFT <3 seconds.  Xerosis noted bilaterally.  Skin. No lacerations or abrasions bilateral feet. Nails 1-5 bilateral  are thickened discolored and elongated with subungual debris.  Hyperkeratotic lesion noted to dorsal lateral fifth digit on the left.  Musculoskeletal: MMT 5/5 bilateral lower extremities in DF, PF, Inversion and Eversion. Deceased ROM in DF of ankle joint.  Neurological: Sensation intact to light touch. Protective sensation intact bilateral.    Assessment:   1. Diabetic peripheral neuropathy associated with type 2 diabetes mellitus (HCC)   2. Hammertoe of left foot      Plan:  Patient was evaluated and treated and all questions answered. -Discussed and educated patient on diabetic foot care, especially with  regards to the vascular, neurological and musculoskeletal systems.  -Stressed the importance of good glycemic control and the detriment of not  controlling glucose levels in relation to the foot. -Discussed supportive shoes at all times  and checking feet regularly.  -Educated on hammertoes and treatment options  -Discussed padding including toe caps and crest pads.  -Hyperkeratotic lesion debrided as courtesy today. -Discussed need for potential surgery if pain does not improved.  -Patient to follow-up as needed. Discussed calling if any changes or increased pain.    Asberry Failing, DPM

## 2024-02-21 ENCOUNTER — Ambulatory Visit: Admitting: Orthopedic Surgery

## 2024-02-25 ENCOUNTER — Ambulatory Visit: Admitting: Orthopedic Surgery

## 2024-02-25 ENCOUNTER — Other Ambulatory Visit: Payer: Self-pay

## 2024-02-25 DIAGNOSIS — Z8781 Personal history of (healed) traumatic fracture: Secondary | ICD-10-CM | POA: Diagnosis not present

## 2024-02-25 DIAGNOSIS — Z9889 Other specified postprocedural states: Secondary | ICD-10-CM

## 2024-02-25 NOTE — Progress Notes (Signed)
   Kristen English - 68 y.o. female MRN [id number]  Date of birth: May 17, 1955  Office Visit Note: Visit Date: 02/25/2024 PCP: Ransom Other, MD Referred by: Ransom Other, MD  Subjective:  HPI: Kristen English is a 68 y.o. female who presents today for follow up 12 weeks status post right wrist distal radius fracture open reduction internal fixation, intra-articular 3+ fragments.  She is doing very well overall, is pleased with her progress.  Has some mild sensitivity over the incisional site.  Pertinent ROS were reviewed with the patient and found to be negative unless otherwise specified above in HPI.   Assessment & Plan: Visit Diagnoses:  1. S/P ORIF (open reduction internal fixation) fracture     Plan: She has done very well postoperatively.  She is demonstrating appropriate range of motion and strength on examination today.  X-rays demonstrate appropriate healing of the distal radius fracture with stable appearance of the orthopedic hardware.  At this juncture she should continue with activities as tolerated moving forward without restriction.  She is welcome return to me in approximately 3 months time for repeat check.  Follow-up: No follow-ups on file.   Meds & Orders: No orders of the defined types were placed in this encounter.   Orders Placed This Encounter  Procedures   XR Wrist Complete Right     Procedures: No procedures performed       Objective:   Vital Signs: There were no vitals taken for this visit.  Ortho Exam Right wrist: - Flexion/extension 55/35 - Pronation/supination 75/75, DRUJ stable in all planes - Composite fist without significant restriction, sensation intact in all distributions median/radial/ulnar, AIN/PIN/interosseous intact, hand is warm well-perfused  Imaging: XR Wrist Complete Right Result Date: 02/25/2024 X-rays of the right wrist demonstrate still appearance of the distal radius construct stabilizing the prior distal radius  fracture which shows appropriate interval healing.  Slight radial shortening with associated ulnar positivity is appreciated, no significant impaction.  Radiocarpal joint remains aligned in all planes without notable step-off.     Vane Yapp Afton Alderton, M.D. [facility], Hand Surgery

## 2024-02-27 DIAGNOSIS — Z940 Kidney transplant status: Secondary | ICD-10-CM | POA: Diagnosis not present

## 2024-02-29 ENCOUNTER — Telehealth: Payer: Self-pay | Admitting: Pharmacist

## 2024-02-29 NOTE — Progress Notes (Signed)
° °  02/29/2024  Patient ID: Kristen English, female   DOB: 29-Nov-1955, 68 y.o.   MRN: [id number]  Called and spoke with the patient on the phone this morning. BG's are about 90's in the mornings. Has switched the insulin 15U in the afternoon instead of 12U and has no complaints of low BG's at this time. Did mention taking her insulin AFTER meals. Advised at least for the evening to try taking the insulin 10 minutes before eating instead, to see how that changes her BG readings. Confirmed understanding.  No concerns or additional needs at this time. Said she's been at the doctor's and dentist offices + a home visit this week. BP systolic has been in the 120's every time. Said her BP at home after was 130's instead- unknown why. Advised her home BP cuff may not be completely accurate. Contact PCP if readings become even higher. It is good that office readings are at goal though!  Plan: - Take afternoon 15U of Novolog BEFORE eating, instead of after    Aloysius Lewis, PharmD, [facility] Phone Number: [phone]

## 2024-03-11 ENCOUNTER — Other Ambulatory Visit (HOSPITAL_COMMUNITY): Payer: Self-pay | Admitting: Nephrology

## 2024-03-11 DIAGNOSIS — N2581 Secondary hyperparathyroidism of renal origin: Secondary | ICD-10-CM

## 2024-03-19 ENCOUNTER — Encounter (HOSPITAL_COMMUNITY)
Admission: RE | Admit: 2024-03-19 | Discharge: 2024-03-19 | Disposition: A | Source: Ambulatory Visit | Attending: Nephrology

## 2024-03-19 ENCOUNTER — Encounter (HOSPITAL_COMMUNITY): Admission: RE | Admit: 2024-03-19 | Source: Ambulatory Visit

## 2024-03-19 DIAGNOSIS — N2581 Secondary hyperparathyroidism of renal origin: Secondary | ICD-10-CM | POA: Diagnosis present

## 2024-03-19 MED ORDER — TECHNETIUM TC 99M SESTAMIBI GENERIC - CARDIOLITE
25.0000 | Freq: Once | INTRAVENOUS | Status: AC | PRN
  Administered 2024-03-19: 26.6 via INTRAVENOUS

## 2024-05-26 ENCOUNTER — Ambulatory Visit: Admitting: Orthopedic Surgery
